# Patient Record
Sex: Female | Born: 1945
Health system: Southern US, Community
[De-identification: ages and names within clinical notes are randomized; demographics above are authoritative.]

## PROBLEM LIST (undated history)

## (undated) DIAGNOSIS — I509 Heart failure, unspecified: Secondary | ICD-10-CM

## (undated) DIAGNOSIS — I10 Essential (primary) hypertension: Secondary | ICD-10-CM

## (undated) DIAGNOSIS — D638 Anemia in other chronic diseases classified elsewhere: Secondary | ICD-10-CM

## (undated) DIAGNOSIS — K529 Noninfective gastroenteritis and colitis, unspecified: Secondary | ICD-10-CM

## (undated) DIAGNOSIS — E039 Hypothyroidism, unspecified: Secondary | ICD-10-CM

## (undated) DIAGNOSIS — C4490 Unspecified malignant neoplasm of skin, unspecified: Secondary | ICD-10-CM

## (undated) DIAGNOSIS — H409 Unspecified glaucoma: Secondary | ICD-10-CM

## (undated) DIAGNOSIS — E669 Obesity, unspecified: Secondary | ICD-10-CM

## (undated) HISTORY — DX: Obesity, unspecified: E66.9

## (undated) HISTORY — DX: Essential (primary) hypertension: I10

## (undated) HISTORY — DX: Unspecified malignant neoplasm of skin, unspecified: C44.90

## (undated) HISTORY — DX: Unspecified glaucoma: H40.9

## (undated) HISTORY — PX: GASTRIC BYPASS: SHX52

## (undated) HISTORY — PX: TUBAL LIGATION: SHX77

## (undated) HISTORY — DX: Noninfective gastroenteritis and colitis, unspecified: K52.9

## (undated) HISTORY — PX: GASTRIC RESTRICTION SURGERY: SHX653

## (undated) HISTORY — DX: Hypothyroidism, unspecified: E03.9

## (undated) HISTORY — DX: Anemia in other chronic diseases classified elsewhere: D63.8

---

## 2007-06-06 ENCOUNTER — Emergency Department (HOSPITAL_COMMUNITY): Admission: EM | Admit: 2007-06-06 | Discharge: 2007-06-06 | Payer: Self-pay | Admitting: Emergency Medicine

## 2011-06-28 DIAGNOSIS — L821 Other seborrheic keratosis: Secondary | ICD-10-CM | POA: Diagnosis not present

## 2011-06-28 DIAGNOSIS — L570 Actinic keratosis: Secondary | ICD-10-CM | POA: Diagnosis not present

## 2011-06-28 DIAGNOSIS — L819 Disorder of pigmentation, unspecified: Secondary | ICD-10-CM | POA: Diagnosis not present

## 2011-06-28 DIAGNOSIS — C44519 Basal cell carcinoma of skin of other part of trunk: Secondary | ICD-10-CM | POA: Diagnosis not present

## 2011-06-28 DIAGNOSIS — D235 Other benign neoplasm of skin of trunk: Secondary | ICD-10-CM | POA: Diagnosis not present

## 2011-06-28 DIAGNOSIS — D485 Neoplasm of uncertain behavior of skin: Secondary | ICD-10-CM | POA: Diagnosis not present

## 2011-08-22 DIAGNOSIS — L570 Actinic keratosis: Secondary | ICD-10-CM | POA: Diagnosis not present

## 2011-08-22 DIAGNOSIS — C44519 Basal cell carcinoma of skin of other part of trunk: Secondary | ICD-10-CM | POA: Diagnosis not present

## 2011-09-27 DIAGNOSIS — H4011X0 Primary open-angle glaucoma, stage unspecified: Secondary | ICD-10-CM | POA: Diagnosis not present

## 2011-09-27 DIAGNOSIS — H04129 Dry eye syndrome of unspecified lacrimal gland: Secondary | ICD-10-CM | POA: Diagnosis not present

## 2011-10-30 DIAGNOSIS — K909 Intestinal malabsorption, unspecified: Secondary | ICD-10-CM | POA: Diagnosis not present

## 2011-10-30 DIAGNOSIS — I10 Essential (primary) hypertension: Secondary | ICD-10-CM | POA: Diagnosis not present

## 2011-10-30 DIAGNOSIS — Z0000 Encounter for general adult medical examination without abnormal findings: Secondary | ICD-10-CM | POA: Diagnosis not present

## 2011-10-30 DIAGNOSIS — D638 Anemia in other chronic diseases classified elsewhere: Secondary | ICD-10-CM | POA: Diagnosis not present

## 2011-10-30 DIAGNOSIS — E039 Hypothyroidism, unspecified: Secondary | ICD-10-CM | POA: Diagnosis not present

## 2011-10-30 DIAGNOSIS — R5381 Other malaise: Secondary | ICD-10-CM | POA: Diagnosis not present

## 2011-10-30 DIAGNOSIS — E669 Obesity, unspecified: Secondary | ICD-10-CM | POA: Diagnosis not present

## 2011-12-31 DIAGNOSIS — Z1231 Encounter for screening mammogram for malignant neoplasm of breast: Secondary | ICD-10-CM | POA: Diagnosis not present

## 2012-02-04 DIAGNOSIS — H4011X0 Primary open-angle glaucoma, stage unspecified: Secondary | ICD-10-CM | POA: Diagnosis not present

## 2012-02-04 DIAGNOSIS — Z23 Encounter for immunization: Secondary | ICD-10-CM | POA: Diagnosis not present

## 2012-02-04 DIAGNOSIS — H04129 Dry eye syndrome of unspecified lacrimal gland: Secondary | ICD-10-CM | POA: Diagnosis not present

## 2012-03-11 DIAGNOSIS — L821 Other seborrheic keratosis: Secondary | ICD-10-CM | POA: Diagnosis not present

## 2012-03-11 DIAGNOSIS — L219 Seborrheic dermatitis, unspecified: Secondary | ICD-10-CM | POA: Diagnosis not present

## 2012-03-11 DIAGNOSIS — L280 Lichen simplex chronicus: Secondary | ICD-10-CM | POA: Diagnosis not present

## 2012-03-11 DIAGNOSIS — Z85828 Personal history of other malignant neoplasm of skin: Secondary | ICD-10-CM | POA: Diagnosis not present

## 2012-03-11 DIAGNOSIS — L819 Disorder of pigmentation, unspecified: Secondary | ICD-10-CM | POA: Diagnosis not present

## 2012-03-11 DIAGNOSIS — L570 Actinic keratosis: Secondary | ICD-10-CM | POA: Diagnosis not present

## 2012-06-21 DIAGNOSIS — B850 Pediculosis due to Pediculus humanus capitis: Secondary | ICD-10-CM | POA: Diagnosis not present

## 2012-09-25 DIAGNOSIS — H04129 Dry eye syndrome of unspecified lacrimal gland: Secondary | ICD-10-CM | POA: Diagnosis not present

## 2012-09-25 DIAGNOSIS — H4011X0 Primary open-angle glaucoma, stage unspecified: Secondary | ICD-10-CM | POA: Diagnosis not present

## 2012-10-15 DIAGNOSIS — M1710 Unilateral primary osteoarthritis, unspecified knee: Secondary | ICD-10-CM | POA: Diagnosis not present

## 2012-10-15 DIAGNOSIS — M25569 Pain in unspecified knee: Secondary | ICD-10-CM | POA: Diagnosis not present

## 2013-01-01 DIAGNOSIS — M25569 Pain in unspecified knee: Secondary | ICD-10-CM | POA: Diagnosis not present

## 2013-01-06 DIAGNOSIS — M25569 Pain in unspecified knee: Secondary | ICD-10-CM | POA: Diagnosis not present

## 2013-01-12 DIAGNOSIS — M25569 Pain in unspecified knee: Secondary | ICD-10-CM | POA: Diagnosis not present

## 2013-01-14 DIAGNOSIS — Z0000 Encounter for general adult medical examination without abnormal findings: Secondary | ICD-10-CM | POA: Diagnosis not present

## 2013-01-14 DIAGNOSIS — R7301 Impaired fasting glucose: Secondary | ICD-10-CM | POA: Diagnosis not present

## 2013-01-21 DIAGNOSIS — Z1231 Encounter for screening mammogram for malignant neoplasm of breast: Secondary | ICD-10-CM | POA: Diagnosis not present

## 2013-01-21 DIAGNOSIS — Z23 Encounter for immunization: Secondary | ICD-10-CM | POA: Diagnosis not present

## 2013-01-21 DIAGNOSIS — D638 Anemia in other chronic diseases classified elsewhere: Secondary | ICD-10-CM | POA: Diagnosis not present

## 2013-01-21 DIAGNOSIS — Z0000 Encounter for general adult medical examination without abnormal findings: Secondary | ICD-10-CM | POA: Diagnosis not present

## 2013-01-21 DIAGNOSIS — E039 Hypothyroidism, unspecified: Secondary | ICD-10-CM | POA: Diagnosis not present

## 2013-01-26 DIAGNOSIS — H04129 Dry eye syndrome of unspecified lacrimal gland: Secondary | ICD-10-CM | POA: Diagnosis not present

## 2013-01-26 DIAGNOSIS — H4011X0 Primary open-angle glaucoma, stage unspecified: Secondary | ICD-10-CM | POA: Diagnosis not present

## 2013-01-26 DIAGNOSIS — M25569 Pain in unspecified knee: Secondary | ICD-10-CM | POA: Diagnosis not present

## 2013-01-29 DIAGNOSIS — M25569 Pain in unspecified knee: Secondary | ICD-10-CM | POA: Diagnosis not present

## 2013-02-03 DIAGNOSIS — M25569 Pain in unspecified knee: Secondary | ICD-10-CM | POA: Diagnosis not present

## 2013-02-04 DIAGNOSIS — M25569 Pain in unspecified knee: Secondary | ICD-10-CM | POA: Diagnosis not present

## 2013-03-19 DIAGNOSIS — T6391XA Toxic effect of contact with unspecified venomous animal, accidental (unintentional), initial encounter: Secondary | ICD-10-CM | POA: Diagnosis not present

## 2013-03-20 DIAGNOSIS — L0291 Cutaneous abscess, unspecified: Secondary | ICD-10-CM | POA: Diagnosis not present

## 2013-03-20 DIAGNOSIS — T6391XA Toxic effect of contact with unspecified venomous animal, accidental (unintentional), initial encounter: Secondary | ICD-10-CM | POA: Diagnosis not present

## 2013-07-27 DIAGNOSIS — L821 Other seborrheic keratosis: Secondary | ICD-10-CM | POA: Diagnosis not present

## 2013-07-27 DIAGNOSIS — L738 Other specified follicular disorders: Secondary | ICD-10-CM | POA: Diagnosis not present

## 2013-07-27 DIAGNOSIS — D1739 Benign lipomatous neoplasm of skin and subcutaneous tissue of other sites: Secondary | ICD-10-CM | POA: Diagnosis not present

## 2013-07-27 DIAGNOSIS — L919 Hypertrophic disorder of the skin, unspecified: Secondary | ICD-10-CM | POA: Diagnosis not present

## 2013-07-27 DIAGNOSIS — D235 Other benign neoplasm of skin of trunk: Secondary | ICD-10-CM | POA: Diagnosis not present

## 2013-07-27 DIAGNOSIS — L819 Disorder of pigmentation, unspecified: Secondary | ICD-10-CM | POA: Diagnosis not present

## 2013-07-27 DIAGNOSIS — L909 Atrophic disorder of skin, unspecified: Secondary | ICD-10-CM | POA: Diagnosis not present

## 2013-07-27 DIAGNOSIS — L570 Actinic keratosis: Secondary | ICD-10-CM | POA: Diagnosis not present

## 2013-07-27 DIAGNOSIS — Z85828 Personal history of other malignant neoplasm of skin: Secondary | ICD-10-CM | POA: Diagnosis not present

## 2013-08-17 DIAGNOSIS — I10 Essential (primary) hypertension: Secondary | ICD-10-CM | POA: Diagnosis not present

## 2013-08-17 DIAGNOSIS — E039 Hypothyroidism, unspecified: Secondary | ICD-10-CM | POA: Diagnosis not present

## 2013-08-25 DIAGNOSIS — W57XXXA Bitten or stung by nonvenomous insect and other nonvenomous arthropods, initial encounter: Secondary | ICD-10-CM | POA: Diagnosis not present

## 2013-08-25 DIAGNOSIS — IMO0001 Reserved for inherently not codable concepts without codable children: Secondary | ICD-10-CM | POA: Diagnosis not present

## 2013-08-25 DIAGNOSIS — R5381 Other malaise: Secondary | ICD-10-CM | POA: Diagnosis not present

## 2013-08-25 DIAGNOSIS — S30860A Insect bite (nonvenomous) of lower back and pelvis, initial encounter: Secondary | ICD-10-CM | POA: Diagnosis not present

## 2013-08-25 DIAGNOSIS — R5383 Other fatigue: Secondary | ICD-10-CM | POA: Diagnosis not present

## 2013-10-26 DIAGNOSIS — IMO0002 Reserved for concepts with insufficient information to code with codable children: Secondary | ICD-10-CM | POA: Diagnosis not present

## 2013-10-26 DIAGNOSIS — R5383 Other fatigue: Secondary | ICD-10-CM | POA: Diagnosis not present

## 2013-10-26 DIAGNOSIS — R5381 Other malaise: Secondary | ICD-10-CM | POA: Diagnosis not present

## 2013-10-26 DIAGNOSIS — S30860A Insect bite (nonvenomous) of lower back and pelvis, initial encounter: Secondary | ICD-10-CM | POA: Diagnosis not present

## 2013-10-26 DIAGNOSIS — W57XXXA Bitten or stung by nonvenomous insect and other nonvenomous arthropods, initial encounter: Secondary | ICD-10-CM | POA: Diagnosis not present

## 2013-11-09 DIAGNOSIS — D649 Anemia, unspecified: Secondary | ICD-10-CM | POA: Diagnosis not present

## 2013-11-09 DIAGNOSIS — H409 Unspecified glaucoma: Secondary | ICD-10-CM | POA: Diagnosis not present

## 2013-11-09 DIAGNOSIS — Z1331 Encounter for screening for depression: Secondary | ICD-10-CM | POA: Diagnosis not present

## 2013-11-09 DIAGNOSIS — E669 Obesity, unspecified: Secondary | ICD-10-CM | POA: Diagnosis not present

## 2013-11-09 DIAGNOSIS — N183 Chronic kidney disease, stage 3 unspecified: Secondary | ICD-10-CM | POA: Diagnosis not present

## 2013-11-09 DIAGNOSIS — I10 Essential (primary) hypertension: Secondary | ICD-10-CM | POA: Diagnosis not present

## 2013-11-09 DIAGNOSIS — Z85828 Personal history of other malignant neoplasm of skin: Secondary | ICD-10-CM | POA: Diagnosis not present

## 2013-11-09 DIAGNOSIS — E039 Hypothyroidism, unspecified: Secondary | ICD-10-CM | POA: Diagnosis not present

## 2013-11-25 DIAGNOSIS — H2000 Unspecified acute and subacute iridocyclitis: Secondary | ICD-10-CM | POA: Diagnosis not present

## 2014-01-19 DIAGNOSIS — I10 Essential (primary) hypertension: Secondary | ICD-10-CM | POA: Diagnosis not present

## 2014-01-19 DIAGNOSIS — E039 Hypothyroidism, unspecified: Secondary | ICD-10-CM | POA: Diagnosis not present

## 2014-01-25 ENCOUNTER — Other Ambulatory Visit: Payer: Self-pay | Admitting: Internal Medicine

## 2014-01-25 DIAGNOSIS — N183 Chronic kidney disease, stage 3 (moderate): Secondary | ICD-10-CM | POA: Diagnosis not present

## 2014-01-25 DIAGNOSIS — E039 Hypothyroidism, unspecified: Secondary | ICD-10-CM | POA: Diagnosis not present

## 2014-01-25 DIAGNOSIS — H409 Unspecified glaucoma: Secondary | ICD-10-CM | POA: Diagnosis not present

## 2014-01-25 DIAGNOSIS — Z1231 Encounter for screening mammogram for malignant neoplasm of breast: Secondary | ICD-10-CM

## 2014-01-25 DIAGNOSIS — Z23 Encounter for immunization: Secondary | ICD-10-CM | POA: Diagnosis not present

## 2014-01-25 DIAGNOSIS — Z0000 Encounter for general adult medical examination without abnormal findings: Secondary | ICD-10-CM | POA: Diagnosis not present

## 2014-01-25 DIAGNOSIS — I10 Essential (primary) hypertension: Secondary | ICD-10-CM | POA: Diagnosis not present

## 2014-01-25 DIAGNOSIS — D649 Anemia, unspecified: Secondary | ICD-10-CM | POA: Diagnosis not present

## 2014-01-25 DIAGNOSIS — E669 Obesity, unspecified: Secondary | ICD-10-CM | POA: Diagnosis not present

## 2014-01-26 DIAGNOSIS — Z1212 Encounter for screening for malignant neoplasm of rectum: Secondary | ICD-10-CM | POA: Diagnosis not present

## 2014-02-08 ENCOUNTER — Ambulatory Visit: Payer: Self-pay

## 2014-02-26 ENCOUNTER — Ambulatory Visit
Admission: RE | Admit: 2014-02-26 | Discharge: 2014-02-26 | Disposition: A | Discharge status: Home / Self Care | Payer: Medicare Other | Source: Ambulatory Visit | Attending: Internal Medicine | Admitting: Internal Medicine

## 2014-02-26 ENCOUNTER — Other Ambulatory Visit: Payer: Self-pay | Admitting: Internal Medicine

## 2014-02-26 DIAGNOSIS — Z1231 Encounter for screening mammogram for malignant neoplasm of breast: Secondary | ICD-10-CM

## 2014-03-01 ENCOUNTER — Ambulatory Visit: Payer: Self-pay

## 2014-03-03 DIAGNOSIS — N183 Chronic kidney disease, stage 3 (moderate): Secondary | ICD-10-CM | POA: Diagnosis not present

## 2014-03-03 DIAGNOSIS — Z6841 Body Mass Index (BMI) 40.0 and over, adult: Secondary | ICD-10-CM | POA: Diagnosis not present

## 2014-03-03 DIAGNOSIS — I10 Essential (primary) hypertension: Secondary | ICD-10-CM | POA: Diagnosis not present

## 2014-03-03 DIAGNOSIS — Z780 Asymptomatic menopausal state: Secondary | ICD-10-CM | POA: Diagnosis not present

## 2014-08-02 DIAGNOSIS — L821 Other seborrheic keratosis: Secondary | ICD-10-CM | POA: Diagnosis not present

## 2014-08-02 DIAGNOSIS — D225 Melanocytic nevi of trunk: Secondary | ICD-10-CM | POA: Diagnosis not present

## 2014-08-02 DIAGNOSIS — L281 Prurigo nodularis: Secondary | ICD-10-CM | POA: Diagnosis not present

## 2014-08-02 DIAGNOSIS — L570 Actinic keratosis: Secondary | ICD-10-CM | POA: Diagnosis not present

## 2014-08-11 DIAGNOSIS — M79672 Pain in left foot: Secondary | ICD-10-CM | POA: Diagnosis not present

## 2014-08-11 DIAGNOSIS — M7742 Metatarsalgia, left foot: Secondary | ICD-10-CM | POA: Diagnosis not present

## 2014-08-17 DIAGNOSIS — K529 Noninfective gastroenteritis and colitis, unspecified: Secondary | ICD-10-CM | POA: Diagnosis not present

## 2014-08-18 DIAGNOSIS — K529 Noninfective gastroenteritis and colitis, unspecified: Secondary | ICD-10-CM | POA: Diagnosis not present

## 2014-09-02 DIAGNOSIS — K529 Noninfective gastroenteritis and colitis, unspecified: Secondary | ICD-10-CM | POA: Diagnosis not present

## 2014-09-02 DIAGNOSIS — I10 Essential (primary) hypertension: Secondary | ICD-10-CM | POA: Diagnosis not present

## 2014-09-02 DIAGNOSIS — M859 Disorder of bone density and structure, unspecified: Secondary | ICD-10-CM | POA: Diagnosis not present

## 2014-09-02 DIAGNOSIS — Z6841 Body Mass Index (BMI) 40.0 and over, adult: Secondary | ICD-10-CM | POA: Diagnosis not present

## 2014-09-02 DIAGNOSIS — E039 Hypothyroidism, unspecified: Secondary | ICD-10-CM | POA: Diagnosis not present

## 2014-09-13 DIAGNOSIS — K529 Noninfective gastroenteritis and colitis, unspecified: Secondary | ICD-10-CM | POA: Diagnosis not present

## 2014-09-13 DIAGNOSIS — I10 Essential (primary) hypertension: Secondary | ICD-10-CM | POA: Diagnosis not present

## 2014-09-13 DIAGNOSIS — E039 Hypothyroidism, unspecified: Secondary | ICD-10-CM | POA: Diagnosis not present

## 2014-09-13 DIAGNOSIS — Z6841 Body Mass Index (BMI) 40.0 and over, adult: Secondary | ICD-10-CM | POA: Diagnosis not present

## 2014-09-13 DIAGNOSIS — R195 Other fecal abnormalities: Secondary | ICD-10-CM | POA: Diagnosis not present

## 2014-10-06 DIAGNOSIS — R197 Diarrhea, unspecified: Secondary | ICD-10-CM | POA: Diagnosis not present

## 2014-10-06 DIAGNOSIS — R195 Other fecal abnormalities: Secondary | ICD-10-CM | POA: Diagnosis not present

## 2014-11-05 DIAGNOSIS — K644 Residual hemorrhoidal skin tags: Secondary | ICD-10-CM | POA: Diagnosis not present

## 2014-11-05 DIAGNOSIS — R195 Other fecal abnormalities: Secondary | ICD-10-CM | POA: Diagnosis not present

## 2014-11-05 DIAGNOSIS — R197 Diarrhea, unspecified: Secondary | ICD-10-CM | POA: Diagnosis not present

## 2014-11-10 DIAGNOSIS — H25093 Other age-related incipient cataract, bilateral: Secondary | ICD-10-CM | POA: Diagnosis not present

## 2014-11-10 DIAGNOSIS — H02833 Dermatochalasis of right eye, unspecified eyelid: Secondary | ICD-10-CM | POA: Diagnosis not present

## 2014-11-10 DIAGNOSIS — H4011X1 Primary open-angle glaucoma, mild stage: Secondary | ICD-10-CM | POA: Diagnosis not present

## 2014-11-10 DIAGNOSIS — H04123 Dry eye syndrome of bilateral lacrimal glands: Secondary | ICD-10-CM | POA: Diagnosis not present

## 2014-12-08 DIAGNOSIS — R5383 Other fatigue: Secondary | ICD-10-CM | POA: Diagnosis not present

## 2014-12-08 DIAGNOSIS — E039 Hypothyroidism, unspecified: Secondary | ICD-10-CM | POA: Diagnosis not present

## 2014-12-08 DIAGNOSIS — Z6841 Body Mass Index (BMI) 40.0 and over, adult: Secondary | ICD-10-CM | POA: Diagnosis not present

## 2014-12-08 DIAGNOSIS — M25562 Pain in left knee: Secondary | ICD-10-CM | POA: Diagnosis not present

## 2015-01-18 ENCOUNTER — Institutional Professional Consult (permissible substitution): Payer: Medicare Other | Admitting: Neurology

## 2015-01-24 DIAGNOSIS — I10 Essential (primary) hypertension: Secondary | ICD-10-CM | POA: Diagnosis not present

## 2015-01-24 DIAGNOSIS — E039 Hypothyroidism, unspecified: Secondary | ICD-10-CM | POA: Diagnosis not present

## 2015-01-31 ENCOUNTER — Ambulatory Visit (INDEPENDENT_AMBULATORY_CARE_PROVIDER_SITE_OTHER): Payer: Medicare Other | Admitting: Neurology

## 2015-01-31 ENCOUNTER — Encounter: Payer: Self-pay | Admitting: Neurology

## 2015-01-31 VITALS — BP 142/82 | HR 86 | Resp 20 | Ht 65.0 in | Wt 259.0 lb

## 2015-01-31 DIAGNOSIS — Z6841 Body Mass Index (BMI) 40.0 and over, adult: Secondary | ICD-10-CM | POA: Diagnosis not present

## 2015-01-31 DIAGNOSIS — G4730 Sleep apnea, unspecified: Secondary | ICD-10-CM

## 2015-01-31 DIAGNOSIS — J302 Other seasonal allergic rhinitis: Secondary | ICD-10-CM | POA: Diagnosis not present

## 2015-01-31 DIAGNOSIS — M170 Bilateral primary osteoarthritis of knee: Secondary | ICD-10-CM | POA: Diagnosis not present

## 2015-01-31 DIAGNOSIS — R270 Ataxia, unspecified: Secondary | ICD-10-CM | POA: Diagnosis not present

## 2015-01-31 DIAGNOSIS — R0902 Hypoxemia: Secondary | ICD-10-CM | POA: Diagnosis not present

## 2015-01-31 DIAGNOSIS — D649 Anemia, unspecified: Secondary | ICD-10-CM | POA: Diagnosis not present

## 2015-01-31 DIAGNOSIS — R0683 Snoring: Secondary | ICD-10-CM | POA: Diagnosis not present

## 2015-01-31 DIAGNOSIS — E669 Obesity, unspecified: Secondary | ICD-10-CM | POA: Diagnosis not present

## 2015-01-31 DIAGNOSIS — Z23 Encounter for immunization: Secondary | ICD-10-CM | POA: Diagnosis not present

## 2015-01-31 DIAGNOSIS — N183 Chronic kidney disease, stage 3 (moderate): Secondary | ICD-10-CM | POA: Diagnosis not present

## 2015-01-31 DIAGNOSIS — I129 Hypertensive chronic kidney disease with stage 1 through stage 4 chronic kidney disease, or unspecified chronic kidney disease: Secondary | ICD-10-CM | POA: Diagnosis not present

## 2015-01-31 DIAGNOSIS — Z1389 Encounter for screening for other disorder: Secondary | ICD-10-CM | POA: Diagnosis not present

## 2015-01-31 DIAGNOSIS — M25562 Pain in left knee: Secondary | ICD-10-CM | POA: Diagnosis not present

## 2015-01-31 DIAGNOSIS — Z0000 Encounter for general adult medical examination without abnormal findings: Secondary | ICD-10-CM | POA: Diagnosis not present

## 2015-01-31 DIAGNOSIS — I10 Essential (primary) hypertension: Secondary | ICD-10-CM | POA: Diagnosis not present

## 2015-01-31 NOTE — Patient Instructions (Signed)

## 2015-01-31 NOTE — Progress Notes (Signed)
SLEEP MEDICINE CLINIC   Provider:  Larey Seat, M D  Referring Provider: Velna Hatchet, MD Primary Care Physician:  Velna Hatchet, MD  Chief Complaint  Patient presents with  . New Patient (Initial Visit)    daytime fatigue, rm 10, alone    Chief complaint according to patient : " I sleep well but I feel fatigued"  HPI:  Lynn Joseph is a 69 y.o. female , seen here as a referral  from Dr. Ardeth Perfect for an evaluation of sleep disturbances.  Lynn Joseph is a Caucasian, right-handed female patient of Dr. Ardeth Perfect , moved to [place] about a year ago to follow her daughter. She reports that her sleep has been nonrestorative and that she was observed and witnessed to snore. She also correlates this with weight gain over the last years. In daytime however she feels fatigued. She is diagnosed with thyroid dysfunction.   She has a history of significant obesity and underwent weight loss surgery with a gastric stapling in 1987. 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 she was in great danger for her life as her potassium levels were depleted and also certain vitamins and trace elements. After the surgery during the wake-up process she was noticed to have hypoxemia and prolonged hypoxia her oxygen levels were dropping. She was very sick , admitted to [facility] in the intensive care unit for quite a while after the gastric obstruction and a sleep evaluation was not yet entertained . She has regained weight since 2009 when her weight was about 180 pounds, now 255 pounds. Now , she is a loud snorer and mouth breather , but does not know if she has apnea. A ONO pulseox was not yet obtained.  Sleep habits are as follows: The patient usually retires to bed between 7 and 12. He has no TV in the  bedroom the bedroom is described as cool, quiet and dark. She sleeps alone a dog and a cat are in the same room. She often doesn't have to rise at all during the night sometimes she has one bathroom break, but this is exceptional. She usually sleeps through until 7:30 AM. She wakes spontaneously. When she wakes up she feels not so much refreshed or restored but she does have a dry mouth. On occasion she will have a morning headache a dull headaches that leave her as the day goes on. She would normally not take any medication for this. The patient prefers to sleep prone or in the lateral recumbent position. When she wakes up however she finds herself in supine. Sometimes after waking up she takes consciously a couple of deep breaths as if she didn't get enough air.  She usually drinks coffee in the morning and will take her dog for walks. She exposes herself to natural daylight. He does like to watch TV in daytime, she drinks caffeinated sodas another couple of coffee usually in the day as well. But not with dinner. When she had a busy day she may nap in the afternoon on her sofa or falling asleep watching TV but this is exceptional.  On week ends she keeps a similar routine.   Sleep medical history and family sleep history: her mother was obese and snored loudly, had HTN, CVA and died at age 35. Her father had  kidney failure at age 24.   Social history: The patient is a lifelong nonsmoker and rarely drinks alcohol, she is widowed, retired and relocated to the area to be near family in early 2015 moving from [place]. The patient used to be self-employed as a Sport and exercise psychologist.  Review of Systems: Out of a complete 14 system review, the patient complains of only the following symptoms, and all other reviewed systems are negative.   Epworth score 4 , Fatigue severity score 48  , geriatric depression score 3 points    Social History   Social History  . Marital Status:  Married    Spouse Name: N/A  . Number of Children: N/A  . Years of Education: N/A   Occupational History  . Not on file.   Social History Main Topics  . Smoking status: Never Smoker   . Smokeless tobacco: Not on file  . Alcohol Use: 0.0 oz/week    0 Standard drinks or equivalent per week     Comment: 1 glass of wine weekly  . Drug Use: No  . Sexual Activity: Not on file   Other Topics Concern  . Not on file   Social History Narrative   Drinks 1-2 cups of caffeine daily.    Family History  Problem Relation Age of Onset  . Hypertension Mother   . Thyroid disease Mother   . Stroke Mother   . Kidney disease Father   . Diabetes Father     Past Medical History  Diagnosis Date  . Obesity   . Anemia of chronic disease   . HTN (hypertension)   . Glaucoma   . Skin cancer   . Chronic diarrhea   . Hypothyroid     Past Surgical History  Procedure Laterality Date  . Gastric bypass    . Tubal ligation      Current Outpatient Prescriptions  Medication Sig Dispense Refill  . latanoprost (XALATAN) 0.005 % ophthalmic solution 1 drop at bedtime.    . Levothyroxine Sodium (TIROSINT) 125 MCG CAPS Take 125 mcg by mouth daily before breakfast.    . metoprolol (LOPRESSOR) 100 MG tablet Take 100 mg by mouth 2 (two) times daily.    . Multiple Vitamin (MULTIVITAMIN) tablet Take 1 tablet by mouth daily.    Marland Kitchen triamterene-hydrochlorothiazide (MAXZIDE-25) 37.5-25 MG tablet Take 1 tablet by mouth daily.    . valsartan (DIOVAN) 160 MG tablet Take 160 mg by mouth daily.     No current facility-administered medications for this visit.    Allergies as of 01/31/2015  . (Not on File)    Vitals: BP 142/82 mmHg  Pulse 86  Resp 20  Ht 5\' 5"  (1.651 m)  Wt 259 lb (117.482 kg)  BMI 43.10 kg/m2 Last Weight:  Wt Readings from Last 1 Encounters:  01/31/15 259 lb (117.482 kg)   KKX:FGHW mass index is 43.1 kg/(m^2).     Last Height:   Ht Readings from Last 1 Encounters:  01/31/15 5\' 5"   (1.651 m)    Physical exam:  General: The patient is awake, alert and appears not in acute distress. The patient is well groomed. Head: Normocephalic, atraumatic. Neck is supple. Mallampati 2 - reddend upper airway   neck circumference:15.5.  Nasal airflow unrestricted, TMJ is not evident .  Retrognathia is seen.  Cardiovascular:  Regular rate and rhythm , without  murmurs or carotid bruit, and without distended neck veins. Respiratory: Lungs are clear to auscultation. Skin:  Without evidence  of edema, or rash Trunk: BMI is elevated . The patient's posture is erect   Neurologic exam : The patient is awake and alert, oriented to place and time.   Memory subjective  described as intact. Attention span & concentration ability appears normal.  Speech is fluent,  without dysarthria, dysphonia or aphasia.  Mood and affect are appropriate.  Cranial nerves: Pupils are equal and briskly reactive to light. Funduscopic exam without  evidence of pallor or edema.  Extraocular movements  in vertical and horizontal planes intact and without nystagmus. Visual fields by finger perimetry are intact. Hearing to finger rub intact.   Facial sensation intact to fine touch.  Facial motor strength is symmetric and tongue and uvula move midline. Shoulder shrug was symmetrical.   Motor exam:   Normal tone, muscle bulk and symmetric strength in all extremities.  Sensory:  Fine touch, pinprick and vibration were tested in all extremities. Proprioception tested in the upper extremities was normal.  Coordination: Rapid alternating movements in the fingers/hands was normal. Finger-to-nose maneuver normal without evidence of ataxia, dysmetria or tremor.  Gait and station: Patient walks without assistive device and is able unassisted to climb up to the exam table. Strength within normal limits.  Stance is stable, yet  widebased.  Toe and heel stand were tested .Tandem gait is unfragmented. Turns with 4 Steps.  Romberg testing revealed swaying backwards, but too mild to fall.   Deep tendon reflexes: in the  upper and lower extremities are symmetric and intact. Babinski maneuver response is downgoing.  The patient was advised of the nature of the diagnosed sleep disorder , the treatment options and risks for general a health and wellness arising from not treating the condition.  I spent more than 45 minutes of face to face time with the patient. Greater than 50% of time was spent in counseling and coordination of care. We have discussed the diagnosis and differential and I answered the patient's questions.     Assessment:  After physical and neurologic examination, review of laboratory studies,  Personal review of imaging studies, reports of other /same  Imaging studies ,  Results of polysomnography/ neurophysiology testing and pre-existing records as far as provided in visit., my assessment is:    1) OSA: her daughter has witnessed sleep apnea and snoring, but not recently. The patient and doors to high degree of fatigue at 48 points but not of sleepiness. The Epworth score was only endorsed at 4 points and she is not clinically depressed. Her medical condition such as thyroid disease could play a role in it as well as possible malabsorption of other trace elements or vitamins. I would like for her to undergo a sleep test and if her insurance carrier allows and attended sleep study. My goal is to see if she has chronic hypoxemia or hypercapnia explaining the fatigue degree even if apnea not be present or only mildly seen. She reports dreaming , and a dry mouth , morning headaches.  2) balance : I would like for Lynn Joseph to assure that she takes a vitamin B complex beyond just vitamin B12 supplement. I also encouraged her to take trace elements especially copper. She has some bite balance problems and the sore and her Romberg that she sways so falling and pre-prevention of falling would be our main goal  here. The posterior column of the spinal cord mediates balance and needs the copper element to perform. She has quit taking B 12, needs to restart.  3) obesity: routine walk of 30 minutes daily or 20 minutes twice daily, or biking. Swimming encouraged as she has joint pain. She snow birds in [facility] and has access to a pool there.    Plan:  Treatment plan and additional workup : SPLIT study with hypercapnia. Should Lynn Joseph sleep study revealed only mild apnea without hypoxemia she could benefit from a mandibular advancement device. Retrognathia would be corrected the device can be titrated in a sleep lab and is usually slowly adjusted to progress the mandibula as not to cause TMJ problems. The patient advised me that she has a history of TMJ pain.     Lynn Partridge Tayo Maute MD  01/31/2015   CC: Velna Hatchet, [place], [place] [address]

## 2015-02-02 ENCOUNTER — Ambulatory Visit (INDEPENDENT_AMBULATORY_CARE_PROVIDER_SITE_OTHER): Payer: Medicare Other | Admitting: Neurology

## 2015-02-02 DIAGNOSIS — G4730 Sleep apnea, unspecified: Secondary | ICD-10-CM | POA: Diagnosis not present

## 2015-02-02 DIAGNOSIS — R0902 Hypoxemia: Secondary | ICD-10-CM

## 2015-02-02 DIAGNOSIS — E669 Obesity, unspecified: Secondary | ICD-10-CM

## 2015-02-02 DIAGNOSIS — R0683 Snoring: Secondary | ICD-10-CM

## 2015-02-03 NOTE — Sleep Study (Signed)
Please see the scanned sleep study interpretation located in the procedure tab within the chart review section.   

## 2015-02-07 ENCOUNTER — Telehealth: Payer: Self-pay

## 2015-02-07 NOTE — Telephone Encounter (Signed)
Spoke to pt regarding her sleep study results. 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 Advised her that pap therapy is optional and that alternate therapies include and oral appliance or an ENT evaluation. Pt was also advised that PLMS were seen in her sleep and that we may consider treating the PLMS if there is a history of RLS. I also advised weight loss, diet, and exerise.  Pt wishes to make an appt with Dr. Brett Fairy to discuss these results before she decided on a treatment option. Appt made with pt for 11/9 at 10:00.

## 2015-03-02 ENCOUNTER — Ambulatory Visit: Payer: Self-pay | Admitting: Neurology

## 2015-03-07 ENCOUNTER — Ambulatory Visit (INDEPENDENT_AMBULATORY_CARE_PROVIDER_SITE_OTHER): Payer: Medicare Other | Admitting: Neurology

## 2015-03-07 ENCOUNTER — Encounter (INDEPENDENT_AMBULATORY_CARE_PROVIDER_SITE_OTHER): Payer: Self-pay

## 2015-03-07 ENCOUNTER — Encounter: Payer: Self-pay | Admitting: Neurology

## 2015-03-07 VITALS — BP 132/90 | HR 70 | Resp 20 | Ht 65.0 in | Wt 265.0 lb

## 2015-03-07 DIAGNOSIS — G4733 Obstructive sleep apnea (adult) (pediatric): Secondary | ICD-10-CM

## 2015-03-07 DIAGNOSIS — R0683 Snoring: Secondary | ICD-10-CM

## 2015-03-07 DIAGNOSIS — G478 Other sleep disorders: Secondary | ICD-10-CM

## 2015-03-07 NOTE — Progress Notes (Signed)
SLEEP MEDICINE CLINIC   Provider:  Larey Seat, M D  Referring Provider: Velna Hatchet, MD Primary Care Physician:  Velna Hatchet, MD  Chief Complaint  Patient presents with  . Follow-up    sleep study results, discuss treatment, rm 11, alone    Chief complaint according to patient : " I sleep well but I feel fatigued"  HPI:  Lynn Joseph is a 69 y.o. female , seen here as a referral  from Dr. Ardeth Perfect for an evaluation of sleep disturbances.  Lynn Joseph is a Caucasian,widowed,  right-handed female patient of Dr. Ardeth Perfect , moved to [place] about a year ago to follow her daughter. She reports that her sleep has been nonrestorative and that she was observed and witnessed to snore. She also correlates this with weight gain over the last years. In daytime however she feels fatigued. She is diagnosed with thyroid dysfunction.  She has a history of significant obesity and underwent weight loss surgery with a gastric stapling in 1987. 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 she was in great danger for her life as her potassium levels were depleted and also certain vitamins and trace elements. After the surgery during the wake-up process she was noticed to have hypoxemia and prolonged hypoxia her oxygen levels were dropping. She was very sick , admitted to [facility] in the intensive care unit for quite a while after the gastric obstruction and a sleep evaluation was not yet entertained. She has regained weight since 2009 , when her weight was its' lowest at about 180 pounds, now 255 pounds.  She is a loud snorer and mouth breather , but does not know if she has apnea. An overnight pulseoximetry was not yet obtained.  Sleep habits are as follows: The patient usually retires to bed between  76 and 12. He has no TV in the bedroom the bedroom is described as cool, quiet and dark. She sleeps alone a dog and a cat are in the same room. She often doesn't have to rise at all during the night sometimes she has one bathroom break, but this is exceptional. She usually sleeps through until 7:30 AM. She wakes spontaneously. When she wakes up she feels not so much refreshed or restored but she does have a dry mouth. On occasion she will have a morning headache a dull headaches that leave her as the day goes on. She would normally not take any medication for this. The patient prefers to sleep prone or in the lateral recumbent position. When she wakes up however she finds herself in supine. Sometimes after waking up she takes consciously a couple of deep breaths as if she didn't get enough air. She usually drinks coffee in the morning and will take her dog for walks. She exposes herself to natural daylight. He does like to watch TV in daytime, she drinks caffeinated sodas another couple of coffee usually in the day as well,  not with dinner. After a busy day she may nap in the afternoon on her sofa or falling asleep watching TV- but this is exceptional. On week ends she keeps a similar routine.   Sleep medical history and family sleep history: her mother was obese and snored loudly, had HTN, CVA and died at age 89. Her father had kidney failure  at age 65.   Social history: The patient is a lifelong nonsmoker and rarely drinks alcohol, she is widowed, retired and relocated to the area to be near family in early 2015 moving from [place]. The patient used to be self-employed as a Sport and exercise psychologist.  Interval history; Lynn Joseph underwent a polysomnography on 02-02-15. He had a slight elevated blood pressure when she entered the study but 142/82 mmHg left arm. She was able to fall asleep after 11.5 minutes but she woke up frequently for the rest of the night it took her a long  time to enter REM sleep of 369 minutes to be exact. Overall she slept only 73% of the time recorded. There were only 10 respiratory events and no evidence of significant sleep apnea. Her oxygen desaturation at the lowest was 80% but the total time in desaturation was only 4.1 minutes. She did have frequent kicking and periodic limb movements with an arousal index of 3.4. Loud snoring was recorded and some of her arousal seem to be related to that. She seemed twice to choke in her sleep although she slept on her right side and not supine.  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 Since this patient did not loose oxygen saturation or just for a trivial amount of time I would recommend an oral appliance or ENT evaluation. In addition it is always beneficial to lose weight. The patient has retrognathia, and is willing to see a dentist for a mandibular advancement.   Review of Systems: Out of a complete 14 system review, the patient complains of only the following symptoms, and all other reviewed systems are negative.   Epworth score 4 , Fatigue severity score 48  , geriatric depression score 3 points    Social History   Social History  . Marital Status: Married    Spouse Name: N/A  . Number of Children: N/A  . Years of Education: N/A   Occupational History  . Not on file.   Social History Main Topics  . Smoking status: Never Smoker   . Smokeless tobacco: Not on file  . Alcohol Use: 0.0 oz/week    0 Standard drinks or equivalent per week     Comment: 1 glass of wine weekly  . Drug Use: No  . Sexual Activity: Not on file   Other Topics Concern  . Not on file   Social History Narrative   Drinks 1-2 cups of caffeine daily.    Family History  Problem Relation Age of Onset  . Hypertension Mother   . Thyroid disease Mother   . Stroke Mother   . Kidney  disease Father   . Diabetes Father     Past Medical History  Diagnosis Date  . Obesity   . Anemia of chronic disease   . HTN (hypertension)   . Glaucoma   . Skin cancer   . Chronic diarrhea   . Hypothyroid     Past Surgical History  Procedure Laterality Date  . Gastric bypass    . Tubal ligation      Current Outpatient Prescriptions  Medication Sig Dispense Refill  . latanoprost (XALATAN) 0.005 % ophthalmic solution 1 drop at bedtime.    . Levothyroxine Sodium (TIROSINT) 125 MCG CAPS Take 125 mcg by mouth daily before breakfast.    . metoprolol (LOPRESSOR) 100 MG tablet Take 100 mg  by mouth 2 (two) times daily.    . Multiple Vitamin (MULTIVITAMIN) tablet Take 1 tablet by mouth daily.    Marland Kitchen triamterene-hydrochlorothiazide (MAXZIDE-25) 37.5-25 MG tablet Take 1 tablet by mouth daily.    . valsartan (DIOVAN) 160 MG tablet Take 160 mg by mouth daily.     No current facility-administered medications for this visit.    Allergies as of 03/07/2015  . (No Known Allergies)    Vitals: BP 132/90 mmHg  Pulse 70  Resp 20  Ht 5\' 5"  (1.651 m)  Wt 265 lb (120.203 kg)  BMI 44.10 kg/m2 Last Weight:  Wt Readings from Last 1 Encounters:  03/07/15 265 lb (120.203 kg)   [id number] mass index is 44.1 kg/(m^2).     Last Height:   Ht Readings from Last 1 Encounters:  03/07/15 5\' 5"  (1.651 m)    Physical exam:  General: The patient is awake, alert and appears not in acute distress. The patient is well groomed. Head: Normocephalic, atraumatic. Neck is supple. Mallampati 2 - reddend upper airway   neck circumference:15.5.  Nasal airflow unrestricted, TMJ is not evident .  Retrognathia is seen.  Cardiovascular:  Regular rate and rhythm , without  murmurs or carotid bruit, and without distended neck veins. Respiratory: Lungs are clear to auscultation. Skin:  Without evidence of edema, or rash Trunk: BMI is elevated . The patient's posture is erect   Neurologic exam : The patient is awake  and alert, oriented to place and time.   Memory subjective  described as intact. Attention span & concentration ability appears normal.  Speech is fluent,  without dysarthria, dysphonia or aphasia.  Mood and affect are appropriate.  Cranial nerves: Pupils are equal and briskly reactive to light. Extraocular movements  in vertical and horizontal planes intact and without nystagmus. Visual fields by finger perimetry are intact. Hearing to finger rub intact. Facial sensation intact to fine touch.Facial motor strength is symmetric and tongue and uvula move midline. Shoulder shrug was symmetrical.   Motor exam:   Normal tone, muscle bulk and symmetric strength in all extremities. Deep tendon reflexes: in the  upper and lower extremities are symmetric and intact. Babinski maneuver response is downgoing.  The patient was advised of the nature of the diagnosed sleep disorder , the treatment options and risks for general a health and wellness arising from not treating the condition.  I spent more than 15 minutes of face to face time with the patient. Greater than 50% of time was spent in counseling and coordination of care. We have discussed the diagnosis and differential and I answered the patient's questions.     Assessment:  After physical and neurologic examination, review of laboratory studies,  Personal review of imaging studies, reports of other /same  Imaging studies ,  Results of polysomnography/ neurophysiology testing and pre-existing records as far as provided in visit., my assessment is:    1) Lynn Joseph presented with clinically insignificant apnea, and AHI of only 2.1 was noted.  She also slept most of the night on the right lateral position and in supine sleep for only 3% of the.total recorded time. There was no supine AHI elicited. During REM sleep she did have personally more events than in non-REM sleep. She did not have hypoxemia.   20 UARS - snoring .My recommendation is to  treat snoring by a dental guard a mandibular advancement device. The patient has a daughter that works in the medical field and I will send a  referral to Dr. Jule Ser, DDS .   3) PLMs  -she had frequent periodic limb movements and arousals by periodic limb movements at 3.4 per hour.-As to her periodic limb movements:  these can be associated with restless legs she states that in the past she had sometimes restless legs but not now. It may still be beneficial for her to take a multivitamin with some iron-  iron deficiency is one of the main triggers for periodic limb movements. I usually recommend a prenatal vitamin which includes folic acid and the B Vit. complex.  Asencion Partridge Zia Kanner MD  03/07/2015   CC: Velna Hatchet, Md [address] [place], [place] [address]   Cc Prn .  Dental referral.

## 2015-03-09 ENCOUNTER — Other Ambulatory Visit: Payer: Self-pay

## 2015-03-09 DIAGNOSIS — Z1231 Encounter for screening mammogram for malignant neoplasm of breast: Secondary | ICD-10-CM

## 2015-04-14 ENCOUNTER — Ambulatory Visit
Admission: RE | Admit: 2015-04-14 | Discharge: 2015-04-14 | Disposition: A | Discharge status: Home / Self Care | Payer: Medicare Other | Source: Ambulatory Visit

## 2015-04-14 DIAGNOSIS — Z1231 Encounter for screening mammogram for malignant neoplasm of breast: Secondary | ICD-10-CM

## 2015-04-19 ENCOUNTER — Other Ambulatory Visit: Payer: Self-pay | Admitting: Internal Medicine

## 2015-04-19 DIAGNOSIS — R928 Other abnormal and inconclusive findings on diagnostic imaging of breast: Secondary | ICD-10-CM

## 2015-04-20 ENCOUNTER — Other Ambulatory Visit: Payer: Self-pay | Admitting: Internal Medicine

## 2015-04-20 ENCOUNTER — Ambulatory Visit
Admission: RE | Admit: 2015-04-20 | Discharge: 2015-04-20 | Disposition: A | Discharge status: Home / Self Care | Payer: Medicare Other | Source: Ambulatory Visit | Attending: Internal Medicine | Admitting: Internal Medicine

## 2015-04-20 DIAGNOSIS — R928 Other abnormal and inconclusive findings on diagnostic imaging of breast: Secondary | ICD-10-CM

## 2015-04-20 DIAGNOSIS — R921 Mammographic calcification found on diagnostic imaging of breast: Secondary | ICD-10-CM | POA: Diagnosis not present

## 2015-04-22 ENCOUNTER — Inpatient Hospital Stay: Admission: RE | Admit: 2015-04-22 | Payer: Medicare Other | Source: Ambulatory Visit

## 2015-05-04 DIAGNOSIS — L821 Other seborrheic keratosis: Secondary | ICD-10-CM | POA: Diagnosis not present

## 2015-05-04 DIAGNOSIS — L570 Actinic keratosis: Secondary | ICD-10-CM | POA: Diagnosis not present

## 2015-05-04 DIAGNOSIS — L814 Other melanin hyperpigmentation: Secondary | ICD-10-CM | POA: Diagnosis not present

## 2015-06-08 DIAGNOSIS — R05 Cough: Secondary | ICD-10-CM | POA: Diagnosis not present

## 2015-06-08 DIAGNOSIS — J019 Acute sinusitis, unspecified: Secondary | ICD-10-CM | POA: Diagnosis not present

## 2015-06-08 DIAGNOSIS — Z6841 Body Mass Index (BMI) 40.0 and over, adult: Secondary | ICD-10-CM | POA: Diagnosis not present

## 2015-06-08 DIAGNOSIS — J029 Acute pharyngitis, unspecified: Secondary | ICD-10-CM | POA: Diagnosis not present

## 2015-06-08 DIAGNOSIS — I10 Essential (primary) hypertension: Secondary | ICD-10-CM | POA: Diagnosis not present

## 2015-06-22 DIAGNOSIS — L570 Actinic keratosis: Secondary | ICD-10-CM | POA: Diagnosis not present

## 2015-07-12 DIAGNOSIS — S0031XA Abrasion of nose, initial encounter: Secondary | ICD-10-CM | POA: Diagnosis not present

## 2015-07-12 DIAGNOSIS — R234 Changes in skin texture: Secondary | ICD-10-CM | POA: Diagnosis not present

## 2015-07-12 DIAGNOSIS — D225 Melanocytic nevi of trunk: Secondary | ICD-10-CM | POA: Diagnosis not present

## 2015-07-12 DIAGNOSIS — L821 Other seborrheic keratosis: Secondary | ICD-10-CM | POA: Diagnosis not present

## 2015-07-12 DIAGNOSIS — D485 Neoplasm of uncertain behavior of skin: Secondary | ICD-10-CM | POA: Diagnosis not present

## 2015-07-12 DIAGNOSIS — L281 Prurigo nodularis: Secondary | ICD-10-CM | POA: Diagnosis not present

## 2015-09-06 DIAGNOSIS — H6121 Impacted cerumen, right ear: Secondary | ICD-10-CM | POA: Diagnosis not present

## 2015-09-06 DIAGNOSIS — E038 Other specified hypothyroidism: Secondary | ICD-10-CM | POA: Diagnosis not present

## 2015-09-06 DIAGNOSIS — Z6841 Body Mass Index (BMI) 40.0 and over, adult: Secondary | ICD-10-CM | POA: Diagnosis not present

## 2015-09-06 DIAGNOSIS — N183 Chronic kidney disease, stage 3 (moderate): Secondary | ICD-10-CM | POA: Diagnosis not present

## 2015-09-06 DIAGNOSIS — E668 Other obesity: Secondary | ICD-10-CM | POA: Diagnosis not present

## 2015-09-06 DIAGNOSIS — I10 Essential (primary) hypertension: Secondary | ICD-10-CM | POA: Diagnosis not present

## 2015-09-06 DIAGNOSIS — R0609 Other forms of dyspnea: Secondary | ICD-10-CM | POA: Diagnosis not present

## 2015-09-14 DIAGNOSIS — H401130 Primary open-angle glaucoma, bilateral, stage unspecified: Secondary | ICD-10-CM | POA: Diagnosis not present

## 2015-09-16 ENCOUNTER — Other Ambulatory Visit (HOSPITAL_COMMUNITY): Payer: Self-pay | Admitting: Internal Medicine

## 2015-09-16 DIAGNOSIS — R0609 Other forms of dyspnea: Principal | ICD-10-CM

## 2015-09-27 ENCOUNTER — Ambulatory Visit (HOSPITAL_COMMUNITY): Payer: Medicare Other | Attending: Cardiology

## 2015-09-27 ENCOUNTER — Other Ambulatory Visit: Payer: Self-pay

## 2015-09-27 ENCOUNTER — Other Ambulatory Visit (HOSPITAL_COMMUNITY): Payer: Self-pay

## 2015-09-27 DIAGNOSIS — I119 Hypertensive heart disease without heart failure: Secondary | ICD-10-CM | POA: Diagnosis not present

## 2015-09-27 DIAGNOSIS — I340 Nonrheumatic mitral (valve) insufficiency: Secondary | ICD-10-CM | POA: Diagnosis not present

## 2015-09-27 DIAGNOSIS — R0600 Dyspnea, unspecified: Secondary | ICD-10-CM | POA: Diagnosis present

## 2015-09-27 DIAGNOSIS — Z6841 Body Mass Index (BMI) 40.0 and over, adult: Secondary | ICD-10-CM | POA: Diagnosis not present

## 2015-09-27 DIAGNOSIS — I371 Nonrheumatic pulmonary valve insufficiency: Secondary | ICD-10-CM | POA: Diagnosis not present

## 2015-09-27 DIAGNOSIS — I071 Rheumatic tricuspid insufficiency: Secondary | ICD-10-CM | POA: Diagnosis not present

## 2015-09-27 DIAGNOSIS — R0609 Other forms of dyspnea: Secondary | ICD-10-CM | POA: Diagnosis not present

## 2015-09-27 LAB — ECHOCARDIOGRAM COMPLETE
AOASC: 36 cm
AVLVOTPG: 3 mmHg
E decel time: 254 msec
E/e' ratio: 8.01
FS: 36 % (ref 28–44)
IVS/LV PW RATIO, ED: 0.98
LA diam end sys: 45 mm
LA diam index: 2.02 cm/m2
LA vol index: 32.3 mL/m2
LA vol: 72 mL
LASIZE: 45 mm
LAVOLA4C: 77 mL
LDCA: 3.14 cm2
LV E/e' medial: 8.01
LV E/e'average: 8.01
LV PW d: 13.2 mm — AB (ref 0.6–1.1)
LV TDI E'LATERAL: 11.4
LV TDI E'MEDIAL: 10.8
LV dias vol index: 43 mL/m2
LV sys vol: 33 mL (ref 14–42)
LVDIAVOL: 96 mL (ref 46–106)
LVELAT: 11.4 cm/s
LVOT SV: 63 mL
LVOT VTI: 20 cm
LVOT peak vel: 88 cm/s
LVOTD: 20 mm
LVSYSVOLIN: 15 mL/m2
Lateral S' vel: 14 cm/s
MV Dec: 254
MV Peak grad: 3 mmHg
MV pk E vel: 91.3 m/s
MVPKAVEL: 69.1 m/s
RV sys press: 31 mmHg
Reg peak vel: 266 cm/s
Simpson's disk: 66
Stroke v: 63 ml
TRMAXVEL: 266 cm/s

## 2015-10-04 DIAGNOSIS — H401130 Primary open-angle glaucoma, bilateral, stage unspecified: Secondary | ICD-10-CM | POA: Diagnosis not present

## 2016-01-25 DIAGNOSIS — E038 Other specified hypothyroidism: Secondary | ICD-10-CM | POA: Diagnosis not present

## 2016-01-25 DIAGNOSIS — R8299 Other abnormal findings in urine: Secondary | ICD-10-CM | POA: Diagnosis not present

## 2016-01-25 DIAGNOSIS — I10 Essential (primary) hypertension: Secondary | ICD-10-CM | POA: Diagnosis not present

## 2016-01-25 DIAGNOSIS — N390 Urinary tract infection, site not specified: Secondary | ICD-10-CM | POA: Diagnosis not present

## 2016-02-07 DIAGNOSIS — I10 Essential (primary) hypertension: Secondary | ICD-10-CM | POA: Diagnosis not present

## 2016-02-07 DIAGNOSIS — Z23 Encounter for immunization: Secondary | ICD-10-CM | POA: Diagnosis not present

## 2016-02-07 DIAGNOSIS — I517 Cardiomegaly: Secondary | ICD-10-CM | POA: Diagnosis not present

## 2016-02-07 DIAGNOSIS — E038 Other specified hypothyroidism: Secondary | ICD-10-CM | POA: Diagnosis not present

## 2016-02-07 DIAGNOSIS — Z1389 Encounter for screening for other disorder: Secondary | ICD-10-CM | POA: Diagnosis not present

## 2016-02-07 DIAGNOSIS — E668 Other obesity: Secondary | ICD-10-CM | POA: Diagnosis not present

## 2016-02-07 DIAGNOSIS — N183 Chronic kidney disease, stage 3 (moderate): Secondary | ICD-10-CM | POA: Diagnosis not present

## 2016-02-07 DIAGNOSIS — Z6841 Body Mass Index (BMI) 40.0 and over, adult: Secondary | ICD-10-CM | POA: Diagnosis not present

## 2016-02-07 DIAGNOSIS — D649 Anemia, unspecified: Secondary | ICD-10-CM | POA: Diagnosis not present

## 2016-02-07 DIAGNOSIS — R0609 Other forms of dyspnea: Secondary | ICD-10-CM | POA: Diagnosis not present

## 2016-02-07 DIAGNOSIS — Z0000 Encounter for general adult medical examination without abnormal findings: Secondary | ICD-10-CM | POA: Diagnosis not present

## 2016-02-13 DIAGNOSIS — Z1212 Encounter for screening for malignant neoplasm of rectum: Secondary | ICD-10-CM | POA: Diagnosis not present

## 2016-02-15 ENCOUNTER — Ambulatory Visit (INDEPENDENT_AMBULATORY_CARE_PROVIDER_SITE_OTHER)
Admission: RE | Admit: 2016-02-15 | Discharge: 2016-02-15 | Disposition: A | Discharge status: Home / Self Care | Payer: Medicare Other | Source: Ambulatory Visit | Attending: Pulmonary Disease | Admitting: Pulmonary Disease

## 2016-02-15 ENCOUNTER — Ambulatory Visit (INDEPENDENT_AMBULATORY_CARE_PROVIDER_SITE_OTHER): Payer: Medicare Other | Admitting: Pulmonary Disease

## 2016-02-15 ENCOUNTER — Encounter: Payer: Self-pay | Admitting: Pulmonary Disease

## 2016-02-15 VITALS — BP 132/74 | HR 59 | Ht 65.0 in | Wt 267.6 lb

## 2016-02-15 DIAGNOSIS — R0600 Dyspnea, unspecified: Secondary | ICD-10-CM | POA: Diagnosis not present

## 2016-02-15 DIAGNOSIS — R0602 Shortness of breath: Secondary | ICD-10-CM

## 2016-02-15 NOTE — Progress Notes (Signed)
Subjective:    [name]: Lynn Joseph, female    DOB: 1945/08/25, 70 y.o.   MRN: [id number]  HPI  Chief Complaint  Patient presents with  . pulmonary consult    per Dr. Ardeth Perfect. pt c/o wheezing worsens during summer months, sob with exertion & occ chest discomfort [id number]    70 year old obese woman referred for evaluation of dyspnea on exertion she reports ongoing symptoms of December with onset of wheezing over the last few weeks.She moved from [place] to [place] about 3 years ago and denies seasonal allergies. She moves to [place] in the winter and lives in a condo on the second floor and a for shortness of breath with climbing stairs. She is also noted raspiness in her voice over the last couple of weeks. Her husband died a few years ago, he was a heavy smoker and she reports exposure to secondhand smoke. She also has chronic anemia. She underwent evaluation by an overnight polysomnogram in 01/2015 which did not show any evidence of obstructive sleep apnea, few PLM's were noted without significant arousals. Echo 09/2015 showed normal LV ejection fraction with RVSP of 31 and no valvular abnormalities She reports a history of gastric bypass in 1988-she developed symptoms suggestive of gastric outlet obstruction and underwent a Roux-en-Y bypass in 2009, this resolved her symptoms of heartburn and she denies significant breakthrough heartburn since then. She reports watery nasal discharge and occasional postnasal drip She has been unable to lose weight and in fact after her husband's death may have gained about 20-25 pounds   Spirometry showed no evidence of airway obstruction with normal lung function although this was a poor effort   she has no prior or family history of venous thromboembolus and       Past Medical History:  Diagnosis Date  . Anemia of chronic disease   . Chronic diarrhea   . Glaucoma   . HTN (hypertension)   . Hypothyroid   . Obesity   . Skin  cancer      Past Surgical History:  Procedure Laterality Date  . GASTRIC BYPASS    . TUBAL LIGATION       No Known Allergies   Social History   Social History  . Marital status: Widowed    Spouse name: N/A  . Number of children: N/A  . Years of education: N/A   Occupational History  . Not on file.   Social History Main Topics  . Smoking status: Never Smoker  . Smokeless tobacco: Never Used  . Alcohol use 0.0 oz/week     Comment: 1 glass of wine weekly  . Drug use: No  . Sexual activity: Not on file   Other Topics Concern  . Not on file   Social History Narrative   Drinks 1-2 cups of caffeine daily.     Family History  Problem Relation Age of Onset  . Hypertension Mother   . Thyroid disease Mother   . Stroke Mother   . Kidney disease Father   . Diabetes Father      Review of Systems  Constitutional: Negative for fever and unexpected weight change.  HENT: Negative for congestion, dental problem, ear pain, nosebleeds, postnasal drip, rhinorrhea, sinus pressure, sneezing, sore throat and trouble swallowing.   Eyes: Negative for redness and itching.  Respiratory: Positive for chest tightness, shortness of breath and wheezing. Negative for cough.   Cardiovascular: Positive for palpitations. Negative for leg swelling.  Gastrointestinal: Negative for nausea and vomiting.  Genitourinary: Negative for dysuria.  Musculoskeletal: Negative for joint swelling.  Skin: Negative for rash.  Neurological: Negative for headaches.  Hematological: Does not bruise/bleed easily.  Psychiatric/Behavioral: Negative for dysphoric mood. The patient is not nervous/anxious.        Objective:   Physical Exam  Gen. Pleasant, obese, in no distress, normal affect ENT - no lesions, no post nasal drip, class 2-3 airway Neck: No JVD, no thyromegaly, no carotid bruits Lungs: no use of accessory muscles, no dullness to percussion, decreased without rales or rhonchi  Cardiovascular:  Rhythm regular, heart sounds  normal, no murmurs or gallops, no peripheral edema Abdomen: soft and non-tender, no hepatosplenomegaly, BS normal. Musculoskeletal: No deformities, no cyanosis or clubbing Neuro:  alert, non focal, no tremors       Assessment & Plan:

## 2016-02-15 NOTE — Patient Instructions (Addendum)
Breathing test showed normal lung function Chest x-ray today

## 2016-02-15 NOTE — Assessment & Plan Note (Signed)
No clear cause identified at this time. She is no evidence of airway obstruction on spirometry, no evidence of interstitial lung disease on exam-we will obtain chest x-ray to clarify. She seems to be at low risk for venous thromboembolism. Her symptoms may simply be related to obesity and deconditioning, other contributing factors may be seasonal allergies or asymptomatic reflux  She will contact as in the future if symptoms worsen. I encouraged her to stay active and work on weight loss as the main interventions at this time

## 2016-02-16 ENCOUNTER — Telehealth: Payer: Self-pay | Admitting: Pulmonary Disease

## 2016-02-16 NOTE — Telephone Encounter (Signed)
Notes Recorded by Rigoberto Noel, MD on 02/15/2016 at 12:32 PM EDT No evidence of lung pathology ------------  Spoke with pt, aware of results/recs.  Nothing further needed.

## 2016-03-14 ENCOUNTER — Other Ambulatory Visit: Payer: Self-pay | Admitting: Internal Medicine

## 2016-03-14 DIAGNOSIS — Z1231 Encounter for screening mammogram for malignant neoplasm of breast: Secondary | ICD-10-CM

## 2016-04-18 ENCOUNTER — Ambulatory Visit: Payer: Medicare Other

## 2016-04-20 ENCOUNTER — Ambulatory Visit: Payer: Medicare Other

## 2016-07-12 DIAGNOSIS — L821 Other seborrheic keratosis: Secondary | ICD-10-CM | POA: Diagnosis not present

## 2016-07-12 DIAGNOSIS — L570 Actinic keratosis: Secondary | ICD-10-CM | POA: Diagnosis not present

## 2016-07-12 DIAGNOSIS — L304 Erythema intertrigo: Secondary | ICD-10-CM | POA: Diagnosis not present

## 2016-07-12 DIAGNOSIS — L814 Other melanin hyperpigmentation: Secondary | ICD-10-CM | POA: Diagnosis not present

## 2016-07-12 DIAGNOSIS — S20309A Unspecified superficial injuries of unspecified front wall of thorax, initial encounter: Secondary | ICD-10-CM | POA: Diagnosis not present

## 2016-07-12 DIAGNOSIS — L820 Inflamed seborrheic keratosis: Secondary | ICD-10-CM | POA: Diagnosis not present

## 2016-07-12 DIAGNOSIS — S20409A Unspecified superficial injuries of unspecified back wall of thorax, initial encounter: Secondary | ICD-10-CM | POA: Diagnosis not present

## 2016-07-12 DIAGNOSIS — D225 Melanocytic nevi of trunk: Secondary | ICD-10-CM | POA: Diagnosis not present

## 2016-07-12 DIAGNOSIS — R208 Other disturbances of skin sensation: Secondary | ICD-10-CM | POA: Diagnosis not present

## 2016-07-12 DIAGNOSIS — L538 Other specified erythematous conditions: Secondary | ICD-10-CM | POA: Diagnosis not present

## 2016-07-12 DIAGNOSIS — L298 Other pruritus: Secondary | ICD-10-CM | POA: Diagnosis not present

## 2016-09-03 DIAGNOSIS — I10 Essential (primary) hypertension: Secondary | ICD-10-CM | POA: Diagnosis not present

## 2016-09-03 DIAGNOSIS — E668 Other obesity: Secondary | ICD-10-CM | POA: Diagnosis not present

## 2016-09-03 DIAGNOSIS — Z1389 Encounter for screening for other disorder: Secondary | ICD-10-CM | POA: Diagnosis not present

## 2016-09-03 DIAGNOSIS — Z6841 Body Mass Index (BMI) 40.0 and over, adult: Secondary | ICD-10-CM | POA: Diagnosis not present

## 2016-09-03 DIAGNOSIS — R0609 Other forms of dyspnea: Secondary | ICD-10-CM | POA: Diagnosis not present

## 2016-09-10 ENCOUNTER — Other Ambulatory Visit: Payer: Self-pay | Admitting: Internal Medicine

## 2016-09-10 DIAGNOSIS — Z1231 Encounter for screening mammogram for malignant neoplasm of breast: Secondary | ICD-10-CM

## 2016-09-13 ENCOUNTER — Ambulatory Visit: Payer: Medicare Other

## 2016-09-26 ENCOUNTER — Ambulatory Visit
Admission: RE | Admit: 2016-09-26 | Discharge: 2016-09-26 | Disposition: A | Discharge status: Home / Self Care | Payer: Medicare Other | Source: Ambulatory Visit | Attending: Internal Medicine | Admitting: Internal Medicine

## 2016-09-26 DIAGNOSIS — Z1231 Encounter for screening mammogram for malignant neoplasm of breast: Secondary | ICD-10-CM | POA: Diagnosis not present

## 2017-01-01 DIAGNOSIS — H401231 Low-tension glaucoma, bilateral, mild stage: Secondary | ICD-10-CM | POA: Diagnosis not present

## 2017-02-05 DIAGNOSIS — E038 Other specified hypothyroidism: Secondary | ICD-10-CM | POA: Diagnosis not present

## 2017-02-05 DIAGNOSIS — I10 Essential (primary) hypertension: Secondary | ICD-10-CM | POA: Diagnosis not present

## 2017-02-12 DIAGNOSIS — Z23 Encounter for immunization: Secondary | ICD-10-CM | POA: Diagnosis not present

## 2017-02-12 DIAGNOSIS — M859 Disorder of bone density and structure, unspecified: Secondary | ICD-10-CM | POA: Diagnosis not present

## 2017-02-12 DIAGNOSIS — E038 Other specified hypothyroidism: Secondary | ICD-10-CM | POA: Diagnosis not present

## 2017-02-12 DIAGNOSIS — I10 Essential (primary) hypertension: Secondary | ICD-10-CM | POA: Diagnosis not present

## 2017-02-12 DIAGNOSIS — E611 Iron deficiency: Secondary | ICD-10-CM | POA: Diagnosis not present

## 2017-02-12 DIAGNOSIS — N183 Chronic kidney disease, stage 3 (moderate): Secondary | ICD-10-CM | POA: Diagnosis not present

## 2017-02-12 DIAGNOSIS — Z6841 Body Mass Index (BMI) 40.0 and over, adult: Secondary | ICD-10-CM | POA: Diagnosis not present

## 2017-02-12 DIAGNOSIS — E668 Other obesity: Secondary | ICD-10-CM | POA: Diagnosis not present

## 2017-02-12 DIAGNOSIS — Z1389 Encounter for screening for other disorder: Secondary | ICD-10-CM | POA: Diagnosis not present

## 2017-02-12 DIAGNOSIS — E538 Deficiency of other specified B group vitamins: Secondary | ICD-10-CM | POA: Diagnosis not present

## 2017-02-12 DIAGNOSIS — H4089 Other specified glaucoma: Secondary | ICD-10-CM | POA: Diagnosis not present

## 2017-02-12 DIAGNOSIS — Z0000 Encounter for general adult medical examination without abnormal findings: Secondary | ICD-10-CM | POA: Diagnosis not present

## 2017-02-12 DIAGNOSIS — I517 Cardiomegaly: Secondary | ICD-10-CM | POA: Diagnosis not present

## 2017-03-19 DIAGNOSIS — N183 Chronic kidney disease, stage 3 (moderate): Secondary | ICD-10-CM | POA: Diagnosis not present

## 2017-03-19 DIAGNOSIS — M859 Disorder of bone density and structure, unspecified: Secondary | ICD-10-CM | POA: Diagnosis not present

## 2017-07-17 DIAGNOSIS — L281 Prurigo nodularis: Secondary | ICD-10-CM | POA: Diagnosis not present

## 2017-07-17 DIAGNOSIS — L821 Other seborrheic keratosis: Secondary | ICD-10-CM | POA: Diagnosis not present

## 2017-07-17 DIAGNOSIS — L814 Other melanin hyperpigmentation: Secondary | ICD-10-CM | POA: Diagnosis not present

## 2017-07-17 DIAGNOSIS — Z08 Encounter for follow-up examination after completed treatment for malignant neoplasm: Secondary | ICD-10-CM | POA: Diagnosis not present

## 2017-07-17 DIAGNOSIS — L570 Actinic keratosis: Secondary | ICD-10-CM | POA: Diagnosis not present

## 2017-07-17 DIAGNOSIS — Z85828 Personal history of other malignant neoplasm of skin: Secondary | ICD-10-CM | POA: Diagnosis not present

## 2017-07-17 DIAGNOSIS — D225 Melanocytic nevi of trunk: Secondary | ICD-10-CM | POA: Diagnosis not present

## 2017-11-18 ENCOUNTER — Other Ambulatory Visit: Payer: Self-pay | Admitting: Internal Medicine

## 2017-11-18 DIAGNOSIS — Z1231 Encounter for screening mammogram for malignant neoplasm of breast: Secondary | ICD-10-CM

## 2017-12-12 ENCOUNTER — Ambulatory Visit
Admission: RE | Admit: 2017-12-12 | Discharge: 2017-12-12 | Disposition: A | Discharge status: Home / Self Care | Payer: Medicare Other | Source: Ambulatory Visit | Attending: Internal Medicine | Admitting: Internal Medicine

## 2017-12-12 DIAGNOSIS — Z1231 Encounter for screening mammogram for malignant neoplasm of breast: Secondary | ICD-10-CM

## 2018-01-07 DIAGNOSIS — H401231 Low-tension glaucoma, bilateral, mild stage: Secondary | ICD-10-CM | POA: Diagnosis not present

## 2018-02-04 DIAGNOSIS — H401231 Low-tension glaucoma, bilateral, mild stage: Secondary | ICD-10-CM | POA: Diagnosis not present

## 2018-02-07 DIAGNOSIS — R82998 Other abnormal findings in urine: Secondary | ICD-10-CM | POA: Diagnosis not present

## 2018-02-07 DIAGNOSIS — E038 Other specified hypothyroidism: Secondary | ICD-10-CM | POA: Diagnosis not present

## 2018-02-07 DIAGNOSIS — I10 Essential (primary) hypertension: Secondary | ICD-10-CM | POA: Diagnosis not present

## 2018-02-07 DIAGNOSIS — E538 Deficiency of other specified B group vitamins: Secondary | ICD-10-CM | POA: Diagnosis not present

## 2018-02-07 DIAGNOSIS — M859 Disorder of bone density and structure, unspecified: Secondary | ICD-10-CM | POA: Diagnosis not present

## 2018-02-25 DIAGNOSIS — R0683 Snoring: Secondary | ICD-10-CM | POA: Diagnosis not present

## 2018-02-25 DIAGNOSIS — Z23 Encounter for immunization: Secondary | ICD-10-CM | POA: Diagnosis not present

## 2018-02-25 DIAGNOSIS — Z1389 Encounter for screening for other disorder: Secondary | ICD-10-CM | POA: Diagnosis not present

## 2018-02-25 DIAGNOSIS — E611 Iron deficiency: Secondary | ICD-10-CM | POA: Diagnosis not present

## 2018-02-25 DIAGNOSIS — E038 Other specified hypothyroidism: Secondary | ICD-10-CM | POA: Diagnosis not present

## 2018-02-25 DIAGNOSIS — I129 Hypertensive chronic kidney disease with stage 1 through stage 4 chronic kidney disease, or unspecified chronic kidney disease: Secondary | ICD-10-CM | POA: Diagnosis not present

## 2018-02-25 DIAGNOSIS — D692 Other nonthrombocytopenic purpura: Secondary | ICD-10-CM | POA: Diagnosis not present

## 2018-02-25 DIAGNOSIS — M859 Disorder of bone density and structure, unspecified: Secondary | ICD-10-CM | POA: Diagnosis not present

## 2018-02-25 DIAGNOSIS — H4089 Other specified glaucoma: Secondary | ICD-10-CM | POA: Diagnosis not present

## 2018-02-25 DIAGNOSIS — Z0000 Encounter for general adult medical examination without abnormal findings: Secondary | ICD-10-CM | POA: Diagnosis not present

## 2018-02-25 DIAGNOSIS — Z6841 Body Mass Index (BMI) 40.0 and over, adult: Secondary | ICD-10-CM | POA: Diagnosis not present

## 2018-02-25 DIAGNOSIS — N183 Chronic kidney disease, stage 3 (moderate): Secondary | ICD-10-CM | POA: Diagnosis not present

## 2018-02-25 DIAGNOSIS — E538 Deficiency of other specified B group vitamins: Secondary | ICD-10-CM | POA: Diagnosis not present

## 2018-10-28 ENCOUNTER — Inpatient Hospital Stay: Payer: Medicare Other

## 2018-10-28 ENCOUNTER — Other Ambulatory Visit: Payer: Self-pay

## 2018-10-28 ENCOUNTER — Emergency Department: Payer: Medicare Other

## 2018-10-28 ENCOUNTER — Inpatient Hospital Stay
Admission: EM | Admit: 2018-10-28 | Discharge: 2018-10-30 | DRG: 291 | Disposition: A | Discharge status: Home Health | Payer: Medicare Other | Attending: Internal Medicine | Admitting: Internal Medicine

## 2018-10-28 ENCOUNTER — Inpatient Hospital Stay
Admit: 2018-10-28 | Discharge: 2018-10-28 | Disposition: A | Discharge status: Home / Self Care | Payer: Medicare Other | Attending: Nurse Practitioner | Admitting: Nurse Practitioner

## 2018-10-28 DIAGNOSIS — I130 Hypertensive heart and chronic kidney disease with heart failure and stage 1 through stage 4 chronic kidney disease, or unspecified chronic kidney disease: Principal | ICD-10-CM | POA: Diagnosis present

## 2018-10-28 DIAGNOSIS — K8020 Calculus of gallbladder without cholecystitis without obstruction: Secondary | ICD-10-CM | POA: Diagnosis not present

## 2018-10-28 DIAGNOSIS — E039 Hypothyroidism, unspecified: Secondary | ICD-10-CM | POA: Diagnosis present

## 2018-10-28 DIAGNOSIS — Z20828 Contact with and (suspected) exposure to other viral communicable diseases: Secondary | ICD-10-CM | POA: Diagnosis present

## 2018-10-28 DIAGNOSIS — R509 Fever, unspecified: Secondary | ICD-10-CM

## 2018-10-28 DIAGNOSIS — Z79899 Other long term (current) drug therapy: Secondary | ICD-10-CM | POA: Diagnosis not present

## 2018-10-28 DIAGNOSIS — I5033 Acute on chronic diastolic (congestive) heart failure: Secondary | ICD-10-CM | POA: Diagnosis present

## 2018-10-28 DIAGNOSIS — J189 Pneumonia, unspecified organism: Secondary | ICD-10-CM | POA: Diagnosis present

## 2018-10-28 DIAGNOSIS — J9801 Acute bronchospasm: Secondary | ICD-10-CM | POA: Diagnosis not present

## 2018-10-28 DIAGNOSIS — N179 Acute kidney failure, unspecified: Secondary | ICD-10-CM | POA: Diagnosis present

## 2018-10-28 DIAGNOSIS — I509 Heart failure, unspecified: Secondary | ICD-10-CM | POA: Diagnosis not present

## 2018-10-28 DIAGNOSIS — J40 Bronchitis, not specified as acute or chronic: Secondary | ICD-10-CM | POA: Diagnosis present

## 2018-10-28 DIAGNOSIS — K219 Gastro-esophageal reflux disease without esophagitis: Secondary | ICD-10-CM | POA: Diagnosis present

## 2018-10-28 DIAGNOSIS — R0602 Shortness of breath: Secondary | ICD-10-CM | POA: Diagnosis not present

## 2018-10-28 DIAGNOSIS — D72829 Elevated white blood cell count, unspecified: Secondary | ICD-10-CM | POA: Diagnosis not present

## 2018-10-28 DIAGNOSIS — Z9884 Bariatric surgery status: Secondary | ICD-10-CM | POA: Diagnosis not present

## 2018-10-28 DIAGNOSIS — Z87891 Personal history of nicotine dependence: Secondary | ICD-10-CM | POA: Diagnosis not present

## 2018-10-28 DIAGNOSIS — A419 Sepsis, unspecified organism: Secondary | ICD-10-CM

## 2018-10-28 DIAGNOSIS — R0689 Other abnormalities of breathing: Secondary | ICD-10-CM | POA: Diagnosis not present

## 2018-10-28 DIAGNOSIS — R062 Wheezing: Secondary | ICD-10-CM | POA: Diagnosis not present

## 2018-10-28 DIAGNOSIS — R079 Chest pain, unspecified: Secondary | ICD-10-CM | POA: Diagnosis not present

## 2018-10-28 DIAGNOSIS — J9601 Acute respiratory failure with hypoxia: Secondary | ICD-10-CM | POA: Diagnosis present

## 2018-10-28 DIAGNOSIS — R7989 Other specified abnormal findings of blood chemistry: Secondary | ICD-10-CM | POA: Diagnosis not present

## 2018-10-28 DIAGNOSIS — N189 Chronic kidney disease, unspecified: Secondary | ICD-10-CM | POA: Diagnosis present

## 2018-10-28 DIAGNOSIS — R0603 Acute respiratory distress: Secondary | ICD-10-CM | POA: Diagnosis not present

## 2018-10-28 DIAGNOSIS — Z209 Contact with and (suspected) exposure to unspecified communicable disease: Secondary | ICD-10-CM | POA: Diagnosis not present

## 2018-10-28 DIAGNOSIS — R1011 Right upper quadrant pain: Secondary | ICD-10-CM

## 2018-10-28 HISTORY — DX: Essential (primary) hypertension: I10

## 2018-10-28 HISTORY — DX: Heart failure, unspecified: I50.9

## 2018-10-28 LAB — LACTIC ACID, PLASMA: Lactic Acid, Venous: 1.9 mmol/L (ref 0.5–1.9)

## 2018-10-28 LAB — APTT: aPTT: 29 seconds (ref 24–36)

## 2018-10-28 LAB — COMPREHENSIVE METABOLIC PANEL
ALT: 85 U/L — ABNORMAL HIGH (ref 0–44)
AST: 187 U/L — ABNORMAL HIGH (ref 15–41)
Albumin: 4.1 g/dL (ref 3.5–5.0)
Alkaline Phosphatase: 164 U/L — ABNORMAL HIGH (ref 38–126)
Anion gap: 10 (ref 5–15)
BUN: 35 mg/dL — ABNORMAL HIGH (ref 8–23)
CO2: 20 mmol/L — ABNORMAL LOW (ref 22–32)
Calcium: 8.5 mg/dL — ABNORMAL LOW (ref 8.9–10.3)
Chloride: 110 mmol/L (ref 98–111)
Creatinine, Ser: 1.38 mg/dL — ABNORMAL HIGH (ref 0.44–1.00)
GFR calc Af Amer: 44 mL/min — ABNORMAL LOW (ref >60)
GFR calc non Af Amer: 38 mL/min — ABNORMAL LOW (ref >60)
Glucose, Bld: 131 mg/dL — ABNORMAL HIGH (ref 70–99)
Potassium: 4.8 mmol/L (ref 3.5–5.1)
Sodium: 140 mmol/L (ref 135–145)
Total Bilirubin: 0.8 mg/dL (ref 0.3–1.2)
Total Protein: 7.6 g/dL (ref 6.5–8.1)

## 2018-10-28 LAB — URINALYSIS, ROUTINE W REFLEX MICROSCOPIC
Bacteria, UA: NONE SEEN
Bilirubin Urine: NEGATIVE
Glucose, UA: NEGATIVE mg/dL
Hgb urine dipstick: NEGATIVE
Ketones, ur: NEGATIVE mg/dL
Nitrite: NEGATIVE
Protein, ur: NEGATIVE mg/dL
Specific Gravity, Urine: 1.016 (ref 1.005–1.030)
pH: 5 (ref 5.0–8.0)

## 2018-10-28 LAB — CBC WITH DIFFERENTIAL/PLATELET
Abs Immature Granulocytes: 0.07 10*3/uL (ref 0.00–0.07)
Basophils Absolute: 0 10*3/uL (ref 0.0–0.1)
Basophils Relative: 0 %
Eosinophils Absolute: 0 10*3/uL (ref 0.0–0.5)
Eosinophils Relative: 0 %
HCT: 32.9 % — ABNORMAL LOW (ref 36.0–46.0)
Hemoglobin: 10.4 g/dL — ABNORMAL LOW (ref 12.0–15.0)
Immature Granulocytes: 1 %
Lymphocytes Relative: 5 %
Lymphs Abs: 0.5 10*3/uL — ABNORMAL LOW (ref 0.7–4.0)
MCH: 28.1 pg (ref 26.0–34.0)
MCHC: 31.6 g/dL (ref 30.0–36.0)
MCV: 88.9 fL (ref 80.0–100.0)
Monocytes Absolute: 0.4 10*3/uL (ref 0.1–1.0)
Monocytes Relative: 5 %
Neutro Abs: 8 10*3/uL — ABNORMAL HIGH (ref 1.7–7.7)
Neutrophils Relative %: 89 %
Platelets: 130 10*3/uL — ABNORMAL LOW (ref 150–400)
RBC: 3.7 MIL/uL — ABNORMAL LOW (ref 3.87–5.11)
RDW: 13.4 % (ref 11.5–15.5)
WBC: 9 10*3/uL (ref 4.0–10.5)
nRBC: 0 % (ref 0.0–0.2)

## 2018-10-28 LAB — BLOOD GAS, VENOUS
Acid-base deficit: 5.6 mmol/L — ABNORMAL HIGH (ref 0.0–2.0)
Bicarbonate: 21.9 mmol/L (ref 20.0–28.0)
O2 Saturation: 12.1 %
Patient temperature: 37
pCO2, Ven: 50 mmHg (ref 44.0–60.0)
pH, Ven: 7.25 (ref 7.250–7.430)
pO2, Ven: <31 mmHg — CL (ref 32.0–45.0)

## 2018-10-28 LAB — BRAIN NATRIURETIC PEPTIDE: B Natriuretic Peptide: 126 pg/mL — ABNORMAL HIGH (ref 0.0–100.0)

## 2018-10-28 LAB — LIPID PANEL
Cholesterol: 160 mg/dL (ref 0–200)
HDL: 55 mg/dL (ref >40)
LDL Cholesterol: 92 mg/dL (ref 0–99)
Total CHOL/HDL Ratio: 2.9 RATIO
Triglycerides: 64 mg/dL (ref <150)
VLDL: 13 mg/dL (ref 0–40)

## 2018-10-28 LAB — MAGNESIUM: Magnesium: 1.3 mg/dL — ABNORMAL LOW (ref 1.7–2.4)

## 2018-10-28 LAB — PROTIME-INR
INR: 1 (ref 0.8–1.2)
Prothrombin Time: 13.4 seconds (ref 11.4–15.2)

## 2018-10-28 LAB — TROPONIN I (HIGH SENSITIVITY): Troponin I (High Sensitivity): 8 ng/L (ref <18)

## 2018-10-28 LAB — SARS CORONAVIRUS 2 BY RT PCR (HOSPITAL ORDER, PERFORMED IN ~~LOC~~ HOSPITAL LAB): SARS Coronavirus 2: NEGATIVE

## 2018-10-28 LAB — TSH: TSH: 0.185 u[IU]/mL — ABNORMAL LOW (ref 0.350–4.500)

## 2018-10-28 LAB — LIPASE, BLOOD: Lipase: 48 U/L (ref 11–51)

## 2018-10-28 LAB — PROCALCITONIN: Procalcitonin: 14.11 ng/mL

## 2018-10-28 LAB — CK: Total CK: 94 U/L (ref 38–234)

## 2018-10-28 MED ORDER — ADULT MULTIVITAMIN W/MINERALS CH
1.0000 | ORAL_TABLET | Freq: Every day | ORAL | Status: DC
  Administered 2018-10-28 – 2018-10-30 (×3): 1 via ORAL
  Filled 2018-10-28 (×3): qty 1

## 2018-10-28 MED ORDER — ACETAMINOPHEN 325 MG PO TABS
650.0000 mg | ORAL_TABLET | ORAL | Status: DC | PRN
  Administered 2018-10-28 – 2018-10-30 (×4): 650 mg via ORAL
  Filled 2018-10-28 (×5): qty 2

## 2018-10-28 MED ORDER — SODIUM CHLORIDE 0.9% FLUSH
3.0000 mL | Freq: Two times a day (BID) | INTRAVENOUS | Status: DC
  Administered 2018-10-28 – 2018-10-30 (×5): 3 mL via INTRAVENOUS

## 2018-10-28 MED ORDER — LEVOTHYROXINE SODIUM 125 MCG PO TABS
125.0000 ug | ORAL_TABLET | Freq: Every day | ORAL | Status: DC
  Administered 2018-10-28 – 2018-10-30 (×3): 125 ug via ORAL
  Filled 2018-10-28 (×3): qty 1

## 2018-10-28 MED ORDER — MAGNESIUM SULFATE 2 GM/50ML IV SOLN
2.0000 g | Freq: Once | INTRAVENOUS | Status: AC
  Administered 2018-10-28: 2 g via INTRAVENOUS
  Filled 2018-10-28: qty 50

## 2018-10-28 MED ORDER — SODIUM CHLORIDE 0.9 % IV SOLN
2.0000 g | INTRAVENOUS | Status: DC
  Administered 2018-10-28 – 2018-10-30 (×3): 2 g via INTRAVENOUS
  Filled 2018-10-28 (×2): qty 2
  Filled 2018-10-28 (×2): qty 20

## 2018-10-28 MED ORDER — ENOXAPARIN SODIUM 40 MG/0.4ML ~~LOC~~ SOLN
40.0000 mg | Freq: Two times a day (BID) | SUBCUTANEOUS | Status: DC
  Administered 2018-10-29: 40 mg via SUBCUTANEOUS
  Filled 2018-10-28 (×2): qty 0.4

## 2018-10-28 MED ORDER — SODIUM CHLORIDE 0.9 % IV SOLN: 250.0000 mL | INTRAVENOUS | Status: DC | PRN

## 2018-10-28 MED ORDER — ACETAMINOPHEN 500 MG PO TABS
1000.0000 mg | ORAL_TABLET | Freq: Once | ORAL | Status: AC
  Administered 2018-10-28: 1000 mg via ORAL
  Filled 2018-10-28: qty 2

## 2018-10-28 MED ORDER — ONDANSETRON HCL 4 MG/2ML IJ SOLN: 4.0000 mg | Freq: Four times a day (QID) | INTRAMUSCULAR | Status: DC | PRN

## 2018-10-28 MED ORDER — IPRATROPIUM-ALBUTEROL 0.5-2.5 (3) MG/3ML IN SOLN
3.0000 mL | Freq: Four times a day (QID) | RESPIRATORY_TRACT | Status: DC
  Administered 2018-10-28 – 2018-10-29 (×3): 3 mL via RESPIRATORY_TRACT
  Filled 2018-10-28 (×4): qty 3

## 2018-10-28 MED ORDER — METOPROLOL TARTRATE 50 MG PO TABS
100.0000 mg | ORAL_TABLET | Freq: Two times a day (BID) | ORAL | Status: DC
  Administered 2018-10-28 – 2018-10-30 (×5): 100 mg via ORAL
  Filled 2018-10-28 (×6): qty 2

## 2018-10-28 MED ORDER — SODIUM CHLORIDE 0.9 % IV SOLN
500.0000 mg | INTRAVENOUS | Status: DC
  Administered 2018-10-28 – 2018-10-30 (×3): 500 mg via INTRAVENOUS
  Filled 2018-10-28 (×4): qty 500

## 2018-10-28 MED ORDER — FUROSEMIDE 10 MG/ML IJ SOLN
20.0000 mg | Freq: Two times a day (BID) | INTRAMUSCULAR | Status: DC
  Administered 2018-10-28 – 2018-10-29 (×2): 20 mg via INTRAVENOUS
  Filled 2018-10-28 (×2): qty 2

## 2018-10-28 MED ORDER — TRIAMTERENE-HCTZ 37.5-25 MG PO TABS
1.0000 | ORAL_TABLET | Freq: Every day | ORAL | Status: DC
  Administered 2018-10-28 – 2018-10-30 (×3): 1 via ORAL
  Filled 2018-10-28 (×3): qty 1

## 2018-10-28 MED ORDER — SODIUM CHLORIDE 0.9% FLUSH: 3.0000 mL | INTRAVENOUS | Status: DC | PRN

## 2018-10-28 MED ORDER — CALCIUM CARBONATE-VITAMIN D 500-200 MG-UNIT PO TABS
1.0000 | ORAL_TABLET | Freq: Every day | ORAL | Status: DC
  Administered 2018-10-28 – 2018-10-30 (×3): 1 via ORAL
  Filled 2018-10-28 (×4): qty 1

## 2018-10-28 NOTE — ED Notes (Signed)
ED TO INPATIENT HANDOFF REPORT  ED Nurse Name and Phone #: Sam [phone]  S Name/Age/Gender Lynn Joseph 73 y.o. female Room/Bed: ED16A/ED16A  Code Status   Code Status: Full Code  Home/SNF/Other Home Patient oriented to: self, place, time and situation Is this baseline? Yes   Triage Complete: Triage complete  Chief Complaint sob  [facility] Pt [facility]. Main complaint SOB and leg swelling. Hx CHF. Denies COPD. Arrives on non rebreather althought oxygen was not low. Denies fever. Denies +COVID contacts.  Also c/o chest tightness since last night, states was burping. Takes lasix, unsure dose. Arrives labored and tachypneic.      Allergies No Known Allergies  Level of Care/Admitting Diagnosis ED Disposition    ED Disposition Condition [facility] Area: [place] [[id number]]  Level of Care: Telemetry [5]  Covid Evaluation: Confirmed COVID Negative  Diagnosis: SOB (shortness of breath) [675916]  Admitting Physician: Eula Flax  Attending Physician: Rufina Falco ACHIENG [[id number]]  Estimated length of stay: past midnight tomorrow  Certification:: I certify this patient will need inpatient services for at least 2 midnights  PT Class (Do Not Modify): Inpatient [101]  PT Acc Code (Do Not Modify): Private [1]       B Medical/Surgery History Past Medical History:  Diagnosis Date  . CHF (congestive heart failure) ([place])   . Hypertension    Past Surgical History:  Procedure Laterality Date  . GASTRIC RESTRICTION SURGERY       A IV Location/Drains/Wounds Patient Lines/Drains/Airways Status   Active Line/Drains/Airways    Name:   Placement date:   Placement time:   Site:   Days:   Peripheral IV 10/28/18 Right Forearm   10/28/18    0746    Forearm   less than 1          Intake/Output Last 24 hours  Intake/Output Summary (Last 24 hours) at 10/28/2018 1111 Last data filed at 10/28/2018  1007 Gross per 24 hour  Intake 351.72 ml  Output -  Net 351.72 ml    Labs/Imaging Results for orders placed or performed during the hospital encounter of 10/28/18 (from the past 48 hour(s))  Comprehensive metabolic panel     Status: Abnormal   Collection Time: 10/28/18  7:12 AM  Result Value Ref Range   Sodium 140 135 - 145 mmol/L   Potassium 4.8 3.5 - 5.1 mmol/L   Chloride 110 98 - 111 mmol/L   CO2 20 (L) 22 - 32 mmol/L   Glucose, Bld 131 (H) 70 - 99 mg/dL   BUN 35 (H) 8 - 23 mg/dL   Creatinine, Ser 1.38 (H) 0.44 - 1.00 mg/dL   Calcium 8.5 (L) 8.9 - 10.3 mg/dL   Total Protein 7.6 6.5 - 8.1 g/dL   Albumin 4.1 3.5 - 5.0 g/dL   AST 187 (H) 15 - 41 U/L   ALT 85 (H) 0 - 44 U/L   Alkaline Phosphatase 164 (H) 38 - 126 U/L   Total Bilirubin 0.8 0.3 - 1.2 mg/dL   GFR calc non Af Amer 38 (L) >60 mL/min   GFR calc Af Amer 44 (L) >60 mL/min   Anion gap 10 5 - 15    Comment: Performed at [facility], [address] [place], [place] [address]  CBC WITH DIFFERENTIAL     Status: Abnormal   Collection Time: 10/28/18  7:12 AM  Result Value Ref Range   WBC  9.0 4.0 - 10.5 K/uL   RBC 3.70 (L) 3.87 - 5.11 MIL/uL   Hemoglobin 10.4 (L) 12.0 - 15.0 g/dL   HCT 32.9 (L) 36.0 - 46.0 %   MCV 88.9 80.0 - 100.0 fL   MCH 28.1 26.0 - 34.0 pg   MCHC 31.6 30.0 - 36.0 g/dL   RDW 13.4 11.5 - 15.5 %   Platelets 130 (L) 150 - 400 K/uL   nRBC 0.0 0.0 - 0.2 %   Neutrophils Relative % 89 %   Neutro Abs 8.0 (H) 1.7 - 7.7 K/uL   Lymphocytes Relative 5 %   Lymphs Abs 0.5 (L) 0.7 - 4.0 K/uL   Monocytes Relative 5 %   Monocytes Absolute 0.4 0.1 - 1.0 K/uL   Eosinophils Relative 0 %   Eosinophils Absolute 0.0 0.0 - 0.5 K/uL   Basophils Relative 0 %   Basophils Absolute 0.0 0.0 - 0.1 K/uL   Immature Granulocytes 1 %   Abs Immature Granulocytes 0.07 0.00 - 0.07 K/uL    Comment: Performed at [facility], [place], [place] [address]  Troponin I (High Sensitivity)      Status: None   Collection Time: 10/28/18  7:12 AM  Result Value Ref Range   Troponin I (High Sensitivity) 8 <18 ng/L    Comment: (NOTE) Elevated high sensitivity troponin I (hsTnI) values and significant  changes across serial measurements may suggest ACS but many other  chronic and acute conditions are known to elevate hsTnI results.  Refer to the "Links" section for chest pain algorithms and additional  guidance. Performed at [facility], [address] [place], [place] [address]   Magnesium     Status: Abnormal   Collection Time: 10/28/18  7:12 AM  Result Value Ref Range   Magnesium 1.3 (L) 1.7 - 2.4 mg/dL    Comment: Performed at [facility], [address] [place], [place] [address]  Protime-INR     Status: None   Collection Time: 10/28/18  7:12 AM  Result Value Ref Range   Prothrombin Time 13.4 11.4 - 15.2 seconds   INR 1.0 0.8 - 1.2    Comment: (NOTE) INR goal varies based on device and disease states. Performed at [facility], [address] [place], [place] [address]   APTT     Status: None   Collection Time: 10/28/18  7:12 AM  Result Value Ref Range   aPTT 29 24 - 36 seconds    Comment: Performed at [facility], [address] [place], [place] [address]  Blood gas, venous     Status: Abnormal   Collection Time: 10/28/18  7:14 AM  Result Value Ref Range   pH, Ven 7.25 7.250 - 7.430   pCO2, Ven 50 44.0 - 60.0 mmHg   pO2, Ven <31.0 (LL) 32.0 - 45.0 mmHg   Bicarbonate 21.9 20.0 - 28.0 mmol/L   Acid-base deficit 5.6 (H) 0.0 - 2.0 mmol/L   O2 Saturation 12.1 %   Patient temperature 37.0    Sample type VENOUS     Comment: Performed at [facility], [address] [place], [place] [address]  Brain natriuretic peptide     Status: Abnormal   Collection Time: 10/28/18  7:20 AM  Result Value Ref Range   B Natriuretic Peptide 126.0 (H) 0.0 - 100.0 pg/mL    Comment: Performed at [facility], [address] [place], [place] [address]  SARS Coronavirus 2 (  CEPHEID- Performed in [facility] hospital lab), Hosp Order     Status: None   Collection Time: 10/28/18  7:20 AM   Specimen: Nasopharyngeal Swab  Result Value Ref Range   SARS Coronavirus 2 NEGATIVE NEGATIVE    Comment: (NOTE) If result is NEGATIVE SARS-CoV-2 target nucleic acids are NOT DETECTED. The SARS-CoV-2 RNA is generally detectable in upper and lower  respiratory specimens during the acute phase of infection. The lowest  concentration of SARS-CoV-2 viral copies this assay can detect is 250  copies / mL. A negative result does not preclude SARS-CoV-2 infection  and should not be used as the sole basis for treatment or other  patient management decisions.  A negative result may occur with  improper specimen collection / handling, submission of specimen other  than nasopharyngeal swab, presence of viral mutation(s) within the  areas targeted by this assay, and inadequate number of viral copies  (<250 copies / mL). A negative result must be combined with clinical  observations, patient history, and epidemiological information. If result is POSITIVE SARS-CoV-2 target nucleic acids are DETECTED. The SARS-CoV-2 RNA is generally detectable in upper and lower  respiratory specimens dur ing the acute phase of infection.  Positive  results are indicative of active infection with SARS-CoV-2.  Clinical  correlation with patient history and other diagnostic information is  necessary to determine patient infection status.  Positive results do  not rule out bacterial infection or co-infection with other viruses. If result is PRESUMPTIVE POSTIVE SARS-CoV-2 nucleic acids MAY BE PRESENT.   A presumptive positive result was obtained on the submitted specimen  and confirmed on repeat testing.  While 2019 novel coronavirus  (SARS-CoV-2) nucleic acids may be present in the submitted sample  additional confirmatory testing may be necessary for  epidemiological  and / or clinical management purposes  to differentiate between  SARS-CoV-2 and other Sarbecovirus currently known to infect humans.  If clinically indicated additional testing with an alternate test  methodology [phone]) is advised. The SARS-CoV-2 RNA is generally  detectable in upper and lower respiratory sp ecimens during the acute  phase of infection. The expected result is Negative. Fact Sheet for Patients:  StrictlyIdeas.no Fact Sheet for Healthcare Providers: BankingDealers.co.za This test is not yet approved or cleared by the [place] FDA and has been authorized for detection and/or diagnosis of SARS-CoV-2 by FDA under an Emergency Use Authorization (EUA).  This EUA will remain in effect (meaning this test can be used) for the duration of the COVID-19 declaration under Section 564(b)(1) of the Act, 21 U.S.C. section 360bbb-3(b)(1), unless the authorization is terminated or revoked sooner. Performed at [facility], [address] [place], [place] [address]   Lactic acid, plasma     Status: None   Collection Time: 10/28/18  8:51 AM  Result Value Ref Range   Lactic Acid, Venous 1.9 0.5 - 1.9 mmol/L    Comment: Performed at [facility], [address] [place], [place] [address]  Urinalysis, Routine w reflex microscopic     Status: Abnormal   Collection Time: 10/28/18  9:24 AM  Result Value Ref Range   Color, Urine YELLOW (A) YELLOW   APPearance HAZY (A) CLEAR   Specific Gravity, Urine 1.016 1.005 - 1.030   pH 5.0 5.0 - 8.0   Glucose, UA NEGATIVE NEGATIVE mg/dL   Hgb urine dipstick NEGATIVE NEGATIVE   Bilirubin Urine NEGATIVE NEGATIVE   Ketones, ur NEGATIVE NEGATIVE mg/dL   Protein, ur NEGATIVE NEGATIVE  mg/dL   Nitrite NEGATIVE NEGATIVE   Leukocytes,Ua TRACE (A) NEGATIVE   RBC / HPF 0-5 0 - 5 RBC/hpf   WBC, UA 6-10 0 - 5 WBC/hpf   Bacteria, UA NONE SEEN NONE SEEN   Squamous  Epithelial / LPF 0-5 0 - 5   Mucus PRESENT     Comment: Performed at [facility], [address] [place], [place] [address]   Dg Chest Port 1 View  Result Date: 10/28/2018 CLINICAL DATA:  Shortness of breath, leg swelling and chest tightness since last night, labored breathing, tachypnea, history CHF, hypertension, former smoker EXAM: PORTABLE CHEST 1 VIEW COMPARISON:  Portable exam 0724 hours without priors for comparison FINDINGS: Enlargement of cardiac silhouette with pulmonary vascular congestion. Mediastinal contours normal. Minimal central peribronchial thickening. Lungs clear. No infiltrate, pleural effusion or pneumothorax. Bones demineralized. IMPRESSION: Enlargement of cardiac silhouette with pulmonary vascular congestion. Minimal bronchitic changes without acute infiltrate. Electronically Signed   By: Lavonia Dana M.D.   On: 10/28/2018 08:12    Pending Labs Unresulted Labs (From admission, onward)    Start     Ordered   10/29/18 7628  Basic metabolic panel  Daily,   STAT     10/28/18 1056   10/29/18 0500  Procalcitonin  Daily,   STAT     10/28/18 1056   10/28/18 1053  Lipase, blood  Once,   STAT     10/28/18 1056   10/28/18 1052  Procalcitonin - Baseline  ONCE - STAT,   STAT     10/28/18 1056   10/28/18 0715  Blood culture (routine x 2)  BLOOD CULTURE X 2,   STAT     10/28/18 0714   10/28/18 0713  Urine culture  ONCE - STAT,   STAT     10/28/18 0714          Vitals/Pain Today's Vitals   10/28/18 0830 10/28/18 0900 10/28/18 0930 10/28/18 1000  BP: (!) 165/67 (!) 142/61 (!) 113/54   Pulse: 94 93 94   Resp: (!) 31 (!) 25 (!) 28   Temp:    (!) 101 F (38.3 C)  TempSrc:      SpO2: 100% 100% 97%   Weight:      Height:      PainSc:        Isolation Precautions Airborne and Contact precautions  Medications Medications  cefTRIAXone (ROCEPHIN) 2 g in sodium chloride 0.9 % 100 mL IVPB ( Intravenous Stopped 10/28/18 0848)  azithromycin (ZITHROMAX) 500 mg in  sodium chloride 0.9 % 250 mL IVPB ( Intravenous Stopped 10/28/18 0956)  metoprolol tartrate (LOPRESSOR) tablet 100 mg (has no administration in time range)  triamterene-hydrochlorothiazide (MAXZIDE-25) 37.5-25 MG per tablet 1 tablet (has no administration in time range)  levothyroxine (SYNTHROID) tablet 125 mcg (has no administration in time range)  calcium-vitamin D (OSCAL WITH D) 500-200 MG-UNIT per tablet 1 tablet (has no administration in time range)  multivitamin with minerals tablet 1 tablet (has no administration in time range)  sodium chloride flush (NS) 0.9 % injection 3 mL (has no administration in time range)  sodium chloride flush (NS) 0.9 % injection 3 mL (has no administration in time range)  0.9 %  sodium chloride infusion (has no administration in time range)  acetaminophen (TYLENOL) tablet 650 mg (has no administration in time range)  ondansetron (ZOFRAN) injection 4 mg (has no administration in time range)  furosemide (LASIX) injection 20 mg (has no administration in time range)  ipratropium-albuterol (DUONEB) 0.5-2.5 (3) MG/3ML nebulizer solution 3 mL (has no administration in time range)  magnesium sulfate IVPB 2 g 50 mL (has no administration in time range)  acetaminophen (TYLENOL) tablet 1,000 mg (1,000 mg Oral Given 10/28/18 0817)    Mobility walks Low fall risk   Focused Assessments Pulmonary Assessment Handoff:  Lung sounds:   O2 Device: Nasal Cannula        R Recommendations: See Admitting Provider Note  Report given to:   Additional Notes:

## 2018-10-28 NOTE — Progress Notes (Signed)
*  PRELIMINARY RESULTS* Echocardiogram 2D Echocardiogram has been performed.  Lynn Joseph 10/28/2018, 2:15 PM

## 2018-10-28 NOTE — Sepsis Progress Note (Signed)
Sepsis bundle tracking reviewed and complete

## 2018-10-28 NOTE — Plan of Care (Signed)
  Problem: Education: Goal: Knowledge of General Education information will improve Description: Including pain rating scale, medication(s)/side effects and non-pharmacologic comfort measures Outcome: Progressing   Problem: Activity: Goal: Risk for activity intolerance will decrease Outcome: Progressing   Problem: Pain Managment: Goal: General experience of comfort will improve Outcome: Progressing   Problem: Skin Integrity: Goal: Risk for impaired skin integrity will decrease Outcome: Progressing   Problem: Activity: Goal: Capacity to carry out activities will improve Outcome: Progressing

## 2018-10-28 NOTE — ED Triage Notes (Signed)
[facility]. Main complaint SOB and leg swelling. Hx CHF. Denies COPD. Arrives on non rebreather althought oxygen was not low. Denies fever. Denies +COVID contacts.  Also c/o chest tightness since last night, states was burping. Takes lasix, unsure dose. Arrives labored and tachypneic.

## 2018-10-28 NOTE — H&P (Signed)
[facility] [facility] NAME: Lynn Joseph    MR#:  [id number]  DATE OF BIRTH:  June 16, 1945  DATE OF ADMISSION:  10/28/2018  PRIMARY CARE PHYSICIAN: Velna Hatchet, MD   REQUESTING/REFERRING PHYSICIAN: Arnette Norris, MD  CHIEF COMPLAINT:   Chief Complaint  Patient presents with   Shortness of Breath   HISTORY OF PRESENT ILLNESS:  73 y.o. 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  Patient states that she woke up at 5 AM this morning with chills and fever, 1 hour later she developed coughing and wheezing.  Also endorses chest tightness for which she took 2 baby aspirin due to concern for possible MI.  She states over the last 3 to 4 months she has gained approximately 20 pounds and noticed swelling in her legs.  Denies other associated symptoms of nausea or vomiting, dizziness, headache, diaphoresis, or diarrhea.  She states she has history of kidney disease and has been intermittent episodes of bilateral flank pain.  She decided to come to the ED today due to worsening shortness of breath with chest tightness.  On arrival to the ED, She was febrile temp 102.2 with blood pressure 186/80 mm Hg and pulse rate 103 beats/min, Resp. 30. Oxygen saturation was in the upper 80s so she was placed on oxygen via nasal cannula. There were no focal neurological deficits; she was alert and oriented x 4 but with increased work of breathing.  Initial labs revealed BUN 35, creatinine 1.38, AST 187, ALT 85 alk phos 164, platelets 130, mag 1.3, BNP 126, UA trace leukocytes, ECG showed sinus rhythm of 51 beats per minute and the chest X-ray shows pulmonary vascular  congestion with minimal bronchitic changes without acute infiltrate. Patient is being admitted under hospitalist service for further management.  PAST MEDICAL HISTORY:   Past Medical History:  Diagnosis Date   CHF (congestive heart failure) ([place])    Hypertension     PAST SURGICAL HISTORY:   Past Surgical History:  Procedure Laterality Date   GASTRIC RESTRICTION SURGERY      SOCIAL HISTORY:   Social History   Tobacco Use   Smoking status: Former Smoker  Substance Use Topics   Alcohol use: Never    Frequency: Never    FAMILY HISTORY:  History reviewed. No pertinent family history.  DRUG ALLERGIES:  No Known Allergies  REVIEW OF SYSTEMS:   Review of Systems  Constitutional: Positive for chills and fever. Negative for malaise/fatigue and weight loss.       Weight gain  HENT: Negative for congestion, hearing loss and sore throat.   Eyes: Negative for blurred vision and double vision.  Respiratory: Positive for cough, shortness of breath and wheezing.   Cardiovascular: Positive for chest pain and leg swelling. Negative for palpitations and orthopnea.  Gastrointestinal: Positive for abdominal pain. Negative for diarrhea, nausea and vomiting.  Genitourinary: Positive for flank pain. Negative for dysuria and urgency.  Musculoskeletal: Positive for back pain. Negative for myalgias.  Skin: Negative for rash.  Neurological: Negative for dizziness, sensory change, speech change, focal weakness and headaches.  Psychiatric/Behavioral: Negative for depression.   MEDICATIONS AT HOME:   Prior to Admission medications   Medication Sig Start Date  End Date Taking? Authorizing Provider  Calcium Carbonate-Vitamin D (CALCIUM 600+D HIGH POTENCY) 600-400 MG-UNIT tablet Take 1 tablet by mouth daily.   Yes [provider]  metoprolol tartrate (LOPRESSOR) 100 MG tablet Take 100 mg by mouth 2 (two) times a day. 08/16/18  Yes [provider]  Multiple Vitamin  (MULTIVITAMIN WITH MINERALS) TABS tablet Take 1 tablet by mouth daily.   Yes [provider]  TIROSINT 125 MCG CAPS Take 125 mcg by mouth daily. 10/22/18  Yes [provider]  triamterene-hydrochlorothiazide (DYAZIDE) 37.5-25 MG capsule Take 1 capsule by mouth daily. 08/07/18  Yes [provider]  valsartan (DIOVAN) 160 MG tablet Take 160 mg by mouth daily. 05/27/18  Yes [provider]      VITAL SIGNS:  Blood pressure (!) 134/53, pulse 92, temperature 98.5 F (36.9 C), temperature source Oral, resp. rate 18, height '5\' 5"'$  (1.651 m), weight 119.3 kg, SpO2 100 %.  PHYSICAL EXAMINATION:   Physical Exam  GENERAL:  73 y.o.-year-old patient lying in the bed with no acute distress.  EYES: Pupils equal, round, reactive to light and accommodation. No scleral icterus. Extraocular muscles intact.  HEENT: Head atraumatic, normocephalic. Oropharynx and nasopharynx clear.  NECK:  Supple, no jugular venous distention. No thyroid enlargement, no tenderness.  LUNGS: Decreased breath sounds bilaterally, moderate wheezing and rales bilaterally. No ,rhonchi or crepitation. Mild use of accessory muscles of respiration.  CARDIOVASCULAR: S1, S2 normal. No murmurs, rubs, or gallops.  ABDOMEN: Soft, nontender, nondistended. Bowel sounds present. No organomegaly or mass.  EXTREMITIES:Bilateral pitting edema of lower legs. No cyanosis, or clubbing. No rash or lesions. + pedal pulses MUSCULOSKELETAL: Normal bulk, and power was 5+ grip and elbow, knee, and ankle flexion and extension bilaterally.  NEUROLOGIC:Alert and oriented x 3. CN 2-12 intact. Sensation to light touch and cold stimuli intact bilaterally. Finger to nose nl. Babinski is downgoing. DTR's (biceps, patellar, and achilles) 2+ and symmetric throughout. Gait not tested due to safety concern. PSYCHIATRIC: The patient is alert and oriented x 3.  SKIN: No obvious rash, lesion, or ulcer.   DATA REVIEWED:  LABORATORY PANEL:     CBC Recent Labs  Lab 10/28/18 0712  WBC 9.0  HGB 10.4*  HCT 32.9*  PLT 130*   ------------------------------------------------------------------------------------------------------------------  Chemistries  Recent Labs  Lab 10/28/18 0712  NA 140  K 4.8  CL 110  CO2 20*  GLUCOSE 131*  BUN 35*  CREATININE 1.38*  CALCIUM 8.5*  MG 1.3*  AST 187*  ALT 85*  ALKPHOS 164*  BILITOT 0.8   ------------------------------------------------------------------------------------------------------------------  Cardiac Enzymes No results for input(s): TROPONINI in the last 168 hours. ------------------------------------------------------------------------------------------------------------------  RADIOLOGY:  Dg Chest Port 1 View  Result Date: 10/28/2018 CLINICAL DATA:  Shortness of breath, leg swelling and chest tightness since last night, labored breathing, tachypnea, history CHF, hypertension, former smoker EXAM: PORTABLE CHEST 1 VIEW COMPARISON:  Portable exam 0724 hours without priors for comparison FINDINGS: Enlargement of cardiac silhouette with pulmonary vascular congestion. Mediastinal contours normal. Minimal central peribronchial thickening. Lungs clear. No infiltrate, pleural effusion or pneumothorax. Bones demineralized. IMPRESSION: Enlargement of cardiac silhouette with pulmonary vascular congestion. Minimal bronchitic changes without acute infiltrate. Electronically Signed   By: Lavonia Dana M.D.   On: 10/28/2018 08:12   US Abdomen Limited Ruq  Result Date: 10/28/2018 CLINICAL DATA:  Acute right upper quadrant abdominal pain. EXAM: ULTRASOUND ABDOMEN LIMITED RIGHT UPPER QUADRANT COMPARISON:  None. FINDINGS: Gallbladder: Minimal cholelithiasis is noted without gallbladder wall thickening or pericholecystic  fluid. No sonographic Murphy's sign is noted. Common bile duct: Diameter: 5 mm which is within normal limits. Liver: No focal lesion identified. Within normal limits in  parenchymal echogenicity. Portal vein is patent on color Doppler imaging with normal direction of blood flow towards the liver. IMPRESSION: Minimal cholelithiasis without evidence of cholecystitis. No other abnormality seen in the right upper quadrant of the abdomen. Electronically Signed   By: Marijo Conception M.D.   On: 10/28/2018 11:55    EKG:  EKG: there are no previous tracings available for comparison.  IMPRESSION AND PLAN:   73 y.o. 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  1. Acute respiratory failure with hypoxia - secondary to fluid overload - Admit to Telemetry unit - Supplemental O2, goal sat 88-92% - DUONebs Q 6 hours for SOB and wheezing  2.  Acute on chronic Diastolic Congestive Heart Failure: Acute presentation likely due to volume overload with associated symptoms of SOB, BLE edema and. BNP mildly elevated at 129 - Chest x-ray shows pulmonary vascular congestion    Last Echo 09/02/2007 , EF 65% - Afterload, Goal MAP <70: Hold Valsartan int he setting of AKI - Beta-Blockade: Metoprolol - Diuretics: Furosemide '20mg'$  IV BID. Diureses >1L negative per day until approach euvolemia / worsening renal function. - Echocardiogram pending - Low salt diet  - Check daily weight - Strict I&Os - CHF Teaching - Cardiology Consult  3. Leukocytosis  - Procalcitonin elevated 14.1 - UA negative - Urine and blood cultures pending - Chest x-ray as above, will repeat following diuresis - Empiric abx Ceftriaxone and Azithromycin  4. Elevated LFTs - US abdomen minimal cholelithiasis no evidence of cholecystitis, if persistent may consider CT - Check hepatitis panel - LDH - Lipid  profile, CK and TSH - Will continue to follow   5. AKI- unknown baseline, patient has remote hx of CKD  - Hold nephrotoxins - Continue to monitor   6. HTN- stable + Goal BP <140/90 - Beta-blocker: Metoprolol - ACE-Inhibitor: Will resume Diovan with improving kidney functions  7. Hypomagnesia - IV mag x1 - Will recheck in am  8. Hypothyroidism - Continue   9. DVT prophylaxis - Lovenox  10. GERD - Protonix   All the records are reviewed and case discussed with ED provider. Management plans discussed with the patient, family and they are in agreement.  CODE STATUS: FULL  TOTAL TIME TAKING CARE OF THIS PATIENT: 40 minutes.    on 10/28/2018 at 12:52 PM  Rufina Falco, DNP, FNP-BC [facility] Nurse Practitioner Between 7am to 6pm - Pager [phone]  After 6pm go to [email or web address] - password [email or web address] [facility]  Office  [phone]  CC: Primary care physician; Velna Hatchet, MD

## 2018-10-28 NOTE — ED Provider Notes (Signed)
[facility] Provider Note  ____________________________________________   First MD Initiated Contact with Patient 10/28/18 [phone]     (approximate)  I have reviewed the triage vital signs and the nursing notes.   HISTORY  Chief Complaint Shortness of Breath    HPI Lynn Joseph is a 73 y.o. female with CHF who presents with SOB. Pt endorses chest pain that started last night and then developed some SOB after. The SOB is now severe, constant, nothing makes it better, worse with ambulation. She is unsure how much lasix she takes at home.  She denies fever. Denies coronovirus contacts.        Patient says that she has noticed some increased weight on her lower the past few months.    Past Medical History:  Diagnosis Date  . CHF (congestive heart failure) ([place])   . Hypertension     There are no active problems to display for this patient.   Past Surgical History:  Procedure Laterality Date  . GASTRIC RESTRICTION SURGERY      Prior to Admission medications   Not on File    Allergies Patient has no known allergies.  History reviewed. No pertinent family history.  Social History Social History   Tobacco Use  . Smoking status: Former Smoker  Substance Use Topics  . Alcohol use: Never    Frequency: Never  . Drug use: Not on file      Review of Systems Constitutional: No fever/chills Eyes: No visual changes. ENT: No sore throat. Cardiovascular:positive chest pain, leg swelling  Respiratory: positive SOB Gastrointestinal: No abdominal pain.  No nausea, no vomiting.  No diarrhea.  No constipation. Genitourinary: Negative for dysuria. Musculoskeletal: Negative for back pain. Skin: Negative for rash. Neurological: Negative for headaches, focal weakness or numbness. All other ROS negative ____________________________________________   PHYSICAL EXAM:  VITAL SIGNS: .Blood pressure (!) 186/80, pulse (!) 103,  temperature (!) 102.2 F (39 C), temperature source Oral, resp. rate (!) 30, height 5\' 5"  (1.651 m), weight 120.2 kg, SpO2 100 %.  Constitutional: Alert and oriented. t increased WOB  Eyes: Conjunctivae are normal. EOMI. Head: Atraumatic. Nose: No congestion/rhinnorhea. Mouth/Throat: Mucous membranes are moist.   Neck: No stridor. Trachea Midline. FROM Cardiovascular: Normal rate, regular rhythm. Grossly normal heart sounds.  Good peripheral circulation. 1+ edema bilaterally  Respiratory: increased WOB, bilateral wheezing, crackles  Gastrointestinal: Soft and nontender. No distention. No abdominal bruits.  Musculoskeletal: No lower extremity tenderness, 1+ edema bilateral,  No joint effusions. Neurologic:  Normal speech and language. No gross focal neurologic deficits are appreciated.  Skin:  Skin is warm, dry and intact. No rash noted. Psychiatric: Mood and affect are normal. Speech and behavior are normal. GU: Deferred   ____________________________________________   LABS (all labs ordered are listed, but only abnormal results are displayed)  Labs Reviewed  URINE CULTURE  SARS CORONAVIRUS 2 ([facility])  CULTURE, BLOOD (ROUTINE X 2)  CULTURE, BLOOD (ROUTINE X 2)  COMPREHENSIVE METABOLIC PANEL  CBC WITH DIFFERENTIAL/PLATELET  BRAIN NATRIURETIC PEPTIDE  URINALYSIS, ROUTINE W REFLEX MICROSCOPIC  TROPONIN I (HIGH SENSITIVITY)  TROPONIN I (HIGH SENSITIVITY)  MAGNESIUM  PROTIME-INR  APTT  BLOOD GAS, VENOUS  LACTIC ACID, PLASMA  LACTIC ACID, PLASMA   ____________________________________________   ED ECG REPORT I, Lynn Joseph, the attending physician, personally viewed and interpreted this ECG.  EKG sinus tachycardia rate of 101, no ST elevation, no T wave inversions, normal intervals.  ____________________________________________  [place], personally viewed and evaluated these images (plain radiographs) as  part of my medical decision making, as well as reviewing the written report by the radiologist.  ED MD interpretation: Chest x-ray with may be some mild pulmonary edema but no large infiltrate.  Official radiology report(s): Dg Chest Port 1 View  Result Date: 10/28/2018 CLINICAL DATA:  Shortness of breath, leg swelling and chest tightness since last night, labored breathing, tachypnea, history CHF, hypertension, former smoker EXAM: PORTABLE CHEST 1 VIEW COMPARISON:  Portable exam 0724 hours without priors for comparison FINDINGS: Enlargement of cardiac silhouette with pulmonary vascular congestion. Mediastinal contours normal. Minimal central peribronchial thickening. Lungs clear. No infiltrate, pleural effusion or pneumothorax. Bones demineralized. IMPRESSION: Enlargement of cardiac silhouette with pulmonary vascular congestion. Minimal bronchitic changes without acute infiltrate. Electronically Signed   By: Lynn Joseph M.D.   On: 10/28/2018 08:12    ____________________________________________   PROCEDURES  Procedure(s) performed (including Critical Care):  .Critical Care Performed by: Lynn Kild[place], MD Authorized by: Lynn Lima, MD   Critical care provider statement:    Critical care time (minutes):  30   Critical care was necessary to treat or prevent imminent or life-threatening deterioration of the following conditions:  Sepsis   Critical care was time spent personally by me on the following activities:  Discussions with consultants, evaluation of patient's response to treatment, examination of patient, ordering and performing treatments and interventions, ordering and review of laboratory studies, ordering and review of radiographic studies, pulse oximetry, re-evaluation of patient's condition, obtaining history from patient or surrogate and review of old charts     ____________________________________________   INITIAL IMPRESSION / [place] / ED COURSE   Lynn Joseph was evaluated in Emergency Department on 10/28/2018 for the symptoms described in the history of present illness. She was evaluated in the context of the global COVID-19 pandemic, which necessitated consideration that the patient might be at risk for infection with the SARS-CoV-2 virus that causes COVID-19. Institutional protocols and algorithms that pertain to the evaluation of patients at risk for COVID-19 are in a state of rapid change based on information released by regulatory bodies including the CDC and federal and state organizations. These policies and algorithms were followed during the patient's care in the ED.   Although patient denied having a fever patient was febrile and tachycardic upon examination This is most concerning for sepsis.  Given patient's shortness of breath will start on coverage for PNA with ceftriaxone, azithro. We also swab for covid 19.  Holding on fluid given history of CHF and on lasix and appears slightly overloaded on exam.  No history of COPD.  SEPSIS alert called.   Clinical Course as of Oct 27 1704  Tue Oct 28, 2018  0833 Labs notable for a PCO2 of 50.  And a pH of 7.25.  Will hold off on BiPAP. LFT slightly elevated however patient had no abdominal pain.   [MF]  0932 Covid test negative but given high suspicion we will keep her on precautions.  Given her normal chest x-ray as well as her leukopenia.  Discussed with medicine for admission.   [MF]    Clinical Course User Index [MF] Lynn Canal [place], MD    Admitted to medicine for community-acquired pneumonia ____________________________________________   FINAL CLINICAL IMPRESSION(S) / ED DIAGNOSES   Final diagnoses:  Acute respiratory distress  Community acquired pneumonia, unspecified laterality      MEDICATIONS GIVEN DURING  THIS VISIT:  Medications  acetaminophen (TYLENOL) tablet 1,000 mg (has no administration in time range)  cefTRIAXone (ROCEPHIN) 2 g in sodium chloride 0.9 % 100 mL  IVPB (has no administration in time range)  azithromycin (ZITHROMAX) 500 mg in sodium chloride 0.9 % 250 mL IVPB (has no administration in time range)     ED Discharge Orders    None       Note:  This document was prepared using Dragon voice recognition software and may include unintentional dictation errors.   Lynn [place], MD 10/28/18 1710

## 2018-10-28 NOTE — Progress Notes (Signed)
CODE SEPSIS - PHARMACY COMMUNICATION  **Broad Spectrum Antibiotics should be administered within 1 hour of Sepsis diagnosis**  Time Code Sepsis Called/Page Received: 0725  Antibiotics Ordered: ceftriaxone/azithromycin  Time of 1st antibiotic administration: 0817  Additional action taken by pharmacy: Called RN  If necessary, Name of Provider/Nurse Contacted:     Tawnya Crook ,PharmD Clinical Pharmacist  10/28/2018  8:27 AM

## 2018-10-28 NOTE — ED Notes (Signed)
Pt currently in [name] and exam complete. EDT [facility] to transport pt from [name] to 2A[facility]233. [facility]A called and informed that pt is on way up to room 233.

## 2018-10-29 LAB — HEPATIC FUNCTION PANEL
ALT: 136 U/L — ABNORMAL HIGH (ref 0–44)
AST: 151 U/L — ABNORMAL HIGH (ref 15–41)
Albumin: 3.4 g/dL — ABNORMAL LOW (ref 3.5–5.0)
Alkaline Phosphatase: 149 U/L — ABNORMAL HIGH (ref 38–126)
Bilirubin, Direct: 0.3 mg/dL — ABNORMAL HIGH (ref 0.0–0.2)
Indirect Bilirubin: 0.7 mg/dL (ref 0.3–0.9)
Total Bilirubin: 1 mg/dL (ref 0.3–1.2)
Total Protein: 6.4 g/dL — ABNORMAL LOW (ref 6.5–8.1)

## 2018-10-29 LAB — BASIC METABOLIC PANEL
Anion gap: 8 (ref 5–15)
BUN: 34 mg/dL — ABNORMAL HIGH (ref 8–23)
CO2: 20 mmol/L — ABNORMAL LOW (ref 22–32)
Calcium: 8.2 mg/dL — ABNORMAL LOW (ref 8.9–10.3)
Chloride: 110 mmol/L (ref 98–111)
Creatinine, Ser: 1.72 mg/dL — ABNORMAL HIGH (ref 0.44–1.00)
GFR calc Af Amer: 34 mL/min — ABNORMAL LOW (ref >60)
GFR calc non Af Amer: 29 mL/min — ABNORMAL LOW (ref >60)
Glucose, Bld: 108 mg/dL — ABNORMAL HIGH (ref 70–99)
Potassium: 4.1 mmol/L (ref 3.5–5.1)
Sodium: 138 mmol/L (ref 135–145)

## 2018-10-29 LAB — CBC WITH DIFFERENTIAL/PLATELET
Abs Immature Granulocytes: 0.03 10*3/uL (ref 0.00–0.07)
Basophils Absolute: 0 10*3/uL (ref 0.0–0.1)
Basophils Relative: 0 %
Eosinophils Absolute: 0.1 10*3/uL (ref 0.0–0.5)
Eosinophils Relative: 1 %
HCT: 27.5 % — ABNORMAL LOW (ref 36.0–46.0)
Hemoglobin: 8.6 g/dL — ABNORMAL LOW (ref 12.0–15.0)
Immature Granulocytes: 0 %
Lymphocytes Relative: 6 %
Lymphs Abs: 0.5 10*3/uL — ABNORMAL LOW (ref 0.7–4.0)
MCH: 27.8 pg (ref 26.0–34.0)
MCHC: 31.3 g/dL (ref 30.0–36.0)
MCV: 89 fL (ref 80.0–100.0)
Monocytes Absolute: 0.5 10*3/uL (ref 0.1–1.0)
Monocytes Relative: 6 %
Neutro Abs: 6.8 10*3/uL (ref 1.7–7.7)
Neutrophils Relative %: 87 %
Platelets: 125 10*3/uL — ABNORMAL LOW (ref 150–400)
RBC: 3.09 MIL/uL — ABNORMAL LOW (ref 3.87–5.11)
RDW: 13.9 % (ref 11.5–15.5)
WBC: 7.9 10*3/uL (ref 4.0–10.5)
nRBC: 0 % (ref 0.0–0.2)

## 2018-10-29 LAB — ECHOCARDIOGRAM COMPLETE
Height: 65 in
Weight: 4206.4 oz

## 2018-10-29 LAB — URINE CULTURE

## 2018-10-29 LAB — MAGNESIUM: Magnesium: 1.8 mg/dL (ref 1.7–2.4)

## 2018-10-29 LAB — PROCALCITONIN: Procalcitonin: 27.53 ng/mL

## 2018-10-29 MED ORDER — IPRATROPIUM-ALBUTEROL 20-100 MCG/ACT IN AERS
1.0000 | INHALATION_SPRAY | Freq: Four times a day (QID) | RESPIRATORY_TRACT | Status: DC | PRN
  Filled 2018-10-29: qty 4

## 2018-10-29 MED ORDER — IPRATROPIUM-ALBUTEROL 0.5-2.5 (3) MG/3ML IN SOLN: 3.0000 mL | Freq: Four times a day (QID) | RESPIRATORY_TRACT | Status: DC | PRN

## 2018-10-29 NOTE — Progress Notes (Signed)
[facility] [place] NAME: Lynn Joseph    MR#:  [id number]  DATE OF BIRTH:  [birth year]  SUBJECTIVE:  CHIEF COMPLAINT:   Chief Complaint  Patient presents with  . Shortness of Breath   Came with fever chills shortness of breath and cough and confusion Much improved today.  Fully alert and oriented and was on room air.  REVIEW OF SYSTEMS:  CONSTITUTIONAL: No fever, fatigue or weakness.  EYES: No blurred or double vision.  EARS, NOSE, AND THROAT: No tinnitus or ear pain.  RESPIRATORY: No cough, shortness of breath, wheezing or hemoptysis.  CARDIOVASCULAR: No chest pain, orthopnea, edema.  GASTROINTESTINAL: No nausea, vomiting, diarrhea or abdominal pain.  GENITOURINARY: No dysuria, hematuria.  ENDOCRINE: No polyuria, nocturia,  HEMATOLOGY: No anemia, easy bruising or bleeding SKIN: No rash or lesion. MUSCULOSKELETAL: No joint pain or arthritis.   NEUROLOGIC: No tingling, numbness, weakness.  PSYCHIATRY: No anxiety or depression.   ROS  DRUG ALLERGIES:  No Known Allergies  VITALS:  Blood pressure (!) 130/45, pulse 68, temperature 98.5 F (36.9 C), temperature source Oral, resp. rate 19, height 5\' 5"  (1.651 m), weight 119.2 kg, SpO2 98 %.  PHYSICAL EXAMINATION:  GENERAL:  73 y.o.-year-old patient lying in the bed with no acute distress.  EYES: Pupils equal, round, reactive to light and accommodation. No scleral icterus. Extraocular muscles intact.  HEENT: Head atraumatic, normocephalic. Oropharynx and nasopharynx clear.  NECK:  Supple, no jugular venous distention. No thyroid enlargement, no tenderness.  LUNGS: Normal breath sounds bilaterally, no wheezing, some crepitation. No use of accessory muscles of respiration.  CARDIOVASCULAR: S1, S2 normal. No murmurs, rubs, or gallops.  ABDOMEN: Soft, nontender, nondistended. Bowel sounds present. No organomegaly or mass.  EXTREMITIES: No pedal edema, cyanosis, or clubbing.   NEUROLOGIC: Cranial nerves II through XII are intact. Muscle strength 5/5 in all extremities. Sensation intact. Gait not checked.  PSYCHIATRIC: The patient is alert and oriented x 3.  SKIN: No obvious rash, lesion, or ulcer.   Physical Exam LABORATORY PANEL:   CBC Recent Labs  Lab 10/29/18 0659  WBC 7.9  HGB 8.6*  HCT 27.5*  PLT 125*   ------------------------------------------------------------------------------------------------------------------  Chemistries  Recent Labs  Lab 10/29/18 0659 10/29/18 0913  NA 138  --   K 4.1  --   CL 110  --   CO2 20*  --   GLUCOSE 108*  --   BUN 34*  --   CREATININE 1.72*  --   CALCIUM 8.2*  --   MG 1.8  --   AST  --  151*  ALT  --  136*  ALKPHOS  --  149*  BILITOT  --  1.0   ------------------------------------------------------------------------------------------------------------------  Cardiac Enzymes No results for input(s): TROPONINI in the last 168 hours. ------------------------------------------------------------------------------------------------------------------  RADIOLOGY:  Dg Chest 2 View  Result Date: 10/28/2018 CLINICAL DATA:  Shortness of breath, wheezing today, CHF, hypertension, former smoker EXAM: CHEST - 2 VIEW COMPARISON:  Earlier study of 10/28/2018 at 0715 hours FINDINGS: Probable enlargement of cardiac silhouette. Mediastinal contours and pulmonary vascularity normal. Minimal peribronchial thickening. No infiltrate, pleural effusion or pneumothorax. Bones demineralized. IMPRESSION: Minimal enlargement of cardiac silhouette and bronchitic changes without acute infiltrate. Electronically Signed   By: Lavonia Dana M.D.   On: 10/28/2018 15:56   Dg Chest Port 1 View  Result Date: 10/28/2018 CLINICAL DATA:  Shortness of breath, leg swelling and chest tightness since last night, labored breathing, tachypnea, history CHF,  hypertension, former smoker EXAM: PORTABLE CHEST 1 VIEW COMPARISON:  Portable exam 0724 hours  without priors for comparison FINDINGS: Enlargement of cardiac silhouette with pulmonary vascular congestion. Mediastinal contours normal. Minimal central peribronchial thickening. Lungs clear. No infiltrate, pleural effusion or pneumothorax. Bones demineralized. IMPRESSION: Enlargement of cardiac silhouette with pulmonary vascular congestion. Minimal bronchitic changes without acute infiltrate. Electronically Signed   By: Lavonia Dana M.D.   On: 10/28/2018 08:12   US Abdomen Limited Ruq  Result Date: 10/28/2018 CLINICAL DATA:  Acute right upper quadrant abdominal pain. EXAM: ULTRASOUND ABDOMEN LIMITED RIGHT UPPER QUADRANT COMPARISON:  None. FINDINGS: Gallbladder: Minimal cholelithiasis is noted without gallbladder wall thickening or pericholecystic fluid. No sonographic Murphy's sign is noted. Common bile duct: Diameter: 5 mm which is within normal limits. Liver: No focal lesion identified. Within normal limits in parenchymal echogenicity. Portal vein is patent on color Doppler imaging with normal direction of blood flow towards the liver. IMPRESSION: Minimal cholelithiasis without evidence of cholecystitis. No other abnormality seen in the right upper quadrant of the abdomen. Electronically Signed   By: Marijo Conception M.D.   On: 10/28/2018 11:55    ASSESSMENT AND PLAN:   Active Problems:   SOB (shortness of breath)  73 y.o. 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  1. Acute respiratory failure with hypoxia - secondary to fluid overload and bronchitis. - Supplemental O2, goal sat 88-92% - DUONebs Q 6 hours for SOB and wheezing. -She improved and on room air now.  2.  Acute Diastolic  Congestive Heart Failure: Acute presentation likely due to volume overload due to infection  BNP mildly elevated at 129 - Chest x-ray shows pulmonary vascular congestion  Last Echo 09/02/2007 , EF 65% - Afterload, Goal MAP <70:Hold Valsartan int he setting of AKI - Beta-Blockade: Metoprolol - Diuretics: Furosemide 20mg  IVBID. Diureses >1L negative per day until approach euvolemia / worsening renal function. - Echocardiogram done, EF 60% and no valvular abnormality. - Low salt diet - Check daily weight - Strict I&Os -We will stop Lasix now as renal function slightly worse.  3. Leukocytosis  - Procalcitonin elevated 14.1 - UA negative - Urine and blood cultures negative. - Chest x-ray as above, will repeat following diuresis - Empiric abx Ceftriaxone and Azithromycin -Her COVID test was negative on admission.  We would do Respi panel.  4. Elevated LFTs - US abdomen minimal cholelithiasis no evidence of cholecystitis -LFTs are coming down on further follow-up. -She does not have tenderness in right upper quadrant. -We will follow LFT again tomorrow but no further work-up needed.  5. AKI- unknown baseline, patient has remote hx of CKD  - Hold nephrotoxins - Continue to monitor-slight worse with Lasix use. -Monitor again tomorrow.  Encourage patient to drink some more liquid today.  6.HTN- stable + Goal BP <140/90 - Beta-blocker: Metoprolol - ACE-Inhibitor: Will resume Diovan with improving kidney functions  7. Hypomagnesia - IV mag x1 - Improved.  8. Hypothyroidism - Continue, TSH normal/.  9. DVT prophylaxis - Lovenox  10. GERD - Protonix    All the records are reviewed and case discussed with Care Management/Social Workerr. Management plans discussed with the patient, family and they are in agreement.  CODE STATUS: Full.  TOTAL TIME  TAKING CARE OF THIS PATIENT: 35 minutes.     POSSIBLE D/C IN 1-2 DAYS, DEPENDING ON CLINICAL  CONDITION.   Vaughan Basta M.D on 10/29/2018   Between 7am to 6pm - Pager - [phone]  After 6pm go to [email or web address] - password [email or web address] [facility]  Office  [phone]  CC: Primary care physician; Velna Hatchet, MD  Note: This dictation was prepared with Dragon dictation along with smaller phrase technology. Any transcriptional errors that result from this process are unintentional.

## 2018-10-29 NOTE — TOC Progression Note (Signed)
Transition of Care [facility]) - Progression Note    Patient Details  Name: Lynn Joseph MRN: [id number] Date of Birth: 07-18-1945  Transition of Care [facility]) CM/SW Contact  Shade Flood, LCSW Phone Number: 10/29/2018, 3:58 PM  Clinical Narrative:     Received return call from pt stating she decided that she would like to have [place] at dc. Reviewed [facility] provider options with pt and referred to [facility] at pt request. Sarah at [place] states that she will discuss with scheduling and return call to inform if they are staffed to be able to accept pt. Will follow.  Expected Discharge Plan: [place] Barriers to Discharge: Continued Medical Work up  Expected Discharge Plan and Services Expected Discharge Plan: [place] In-house Referral: Clinical Social Work     Living arrangements for the past 2 months: Single Family Home                                       Social Determinants of Health (SDOH) Interventions    Readmission Risk Interventions Readmission Risk Prevention Plan 10/29/2018  Medication Screening Complete  Transportation Screening Complete

## 2018-10-29 NOTE — Progress Notes (Signed)
Pt reswabbed for covid. Spec hand carried to lab.

## 2018-10-29 NOTE — TOC Initial Note (Signed)
Transition of Care [facility]) - Initial/Assessment Note    Patient Details  Name: Lynn Joseph MRN: [id number] Date of Birth: February 12, 1946  Transition of Care [facility]) CM/SW Contact:    Shade Flood, LCSW Phone Number: 10/29/2018, 3:11 PM  Clinical Narrative:                  Pt admitted from home. Received TOC consult to assess for [facility] RN needs. Spoke with pt by phone due to droplet precautions in place. HHC offered. Pt stating she would like to call her daughter and then let LCSW know.   Pt has been independent in ADLs at home. She states that she does not have any difficulty getting to appointments or obtaining medications.   Pt is not anticipating any other TOC needs for dc. TOC will follow and refer to [facility] if pt decides she wants it.  Expected Discharge Plan: [place] Barriers to Discharge: Continued Medical Work up   Patient Goals and CMS Choice Patient states their goals for this hospitalization and ongoing recovery are:: Return home to prior level of function CMS Medicare.gov Compare Post Acute Care list provided to:: Patient Choice offered to / list presented to : Patient  Expected Discharge Plan and Services Expected Discharge Plan: [place] In-house Referral: Clinical Social Work     Living arrangements for the past 2 months: [place]                                      Prior Living Arrangements/Services Living arrangements for the past 2 months: Single Family Home Lives with:: Self Patient language and need for interpreter reviewed:: Yes Do you feel safe going back to the place where you live?: Yes      Need for Family Participation in Patient Care: No (Comment) Care giver support system in place?: Yes (comment)   Criminal Activity/Legal Involvement Pertinent to Current Situation/Hospitalization: No - Comment as needed  Activities of Daily Living Home Assistive Devices/Equipment: None ADL Screening  (condition at time of admission) Patient's cognitive ability adequate to safely complete daily activities?: Yes Is the patient deaf or have difficulty hearing?: No Does the patient have difficulty seeing, even when wearing glasses/contacts?: No Does the patient have difficulty concentrating, remembering, or making decisions?: No Patient able to express need for assistance with ADLs?: Yes Does the patient have difficulty dressing or bathing?: No Independently performs ADLs?: Yes (appropriate for developmental age) Does the patient have difficulty walking or climbing stairs?: No Weakness of Legs: None Weakness of Arms/Hands: None  Permission Sought/Granted                  Emotional Assessment Appearance:: Appears stated age Attitude/Demeanor/Rapport: Engaged Affect (typically observed): Pleasant Orientation: : Oriented to Self, Oriented to Place, Oriented to  Time, Oriented to Situation Alcohol / Substance Use: Not Applicable Psych Involvement: No (comment)  Admission diagnosis:  Acute respiratory distress [R06.03] Abdominal pain, RUQ [R10.11] Community acquired pneumonia, unspecified laterality [J18.9] Patient Active Problem List   Diagnosis Date Noted  . SOB (shortness of breath) 10/28/2018   PCP:  Velna Hatchet, MD Pharmacy:   [facility] [place] [address] Phone: [phone] Fax: [phone]     Social Determinants of Health (SDOH) Interventions    Readmission Risk Interventions Readmission Risk  Prevention Plan 10/29/2018  Medication Screening Complete  Transportation Screening Complete

## 2018-10-30 ENCOUNTER — Inpatient Hospital Stay: Payer: Medicare Other

## 2018-10-30 LAB — RESPIRATORY PANEL BY PCR

## 2018-10-30 LAB — COMPREHENSIVE METABOLIC PANEL
ALT: 137 U/L — ABNORMAL HIGH (ref 0–44)
AST: 111 U/L — ABNORMAL HIGH (ref 15–41)
Albumin: 3.5 g/dL (ref 3.5–5.0)
Alkaline Phosphatase: 181 U/L — ABNORMAL HIGH (ref 38–126)
Anion gap: 11 (ref 5–15)
BUN: 33 mg/dL — ABNORMAL HIGH (ref 8–23)
CO2: 23 mmol/L (ref 22–32)
Calcium: 8.6 mg/dL — ABNORMAL LOW (ref 8.9–10.3)
Chloride: 106 mmol/L (ref 98–111)
Creatinine, Ser: 1.67 mg/dL — ABNORMAL HIGH (ref 0.44–1.00)
GFR calc Af Amer: 35 mL/min — ABNORMAL LOW (ref >60)
GFR calc non Af Amer: 30 mL/min — ABNORMAL LOW (ref >60)
Glucose, Bld: 105 mg/dL — ABNORMAL HIGH (ref 70–99)
Potassium: 4.3 mmol/L (ref 3.5–5.1)
Sodium: 140 mmol/L (ref 135–145)
Total Bilirubin: 0.7 mg/dL (ref 0.3–1.2)
Total Protein: 6.9 g/dL (ref 6.5–8.1)

## 2018-10-30 LAB — CBC
HCT: 29.2 % — ABNORMAL LOW (ref 36.0–46.0)
Hemoglobin: 9.2 g/dL — ABNORMAL LOW (ref 12.0–15.0)
MCH: 28.2 pg (ref 26.0–34.0)
MCHC: 31.5 g/dL (ref 30.0–36.0)
MCV: 89.6 fL (ref 80.0–100.0)
Platelets: 137 10*3/uL — ABNORMAL LOW (ref 150–400)
RBC: 3.26 MIL/uL — ABNORMAL LOW (ref 3.87–5.11)
RDW: 13.8 % (ref 11.5–15.5)
WBC: 5.6 10*3/uL (ref 4.0–10.5)
nRBC: 0 % (ref 0.0–0.2)

## 2018-10-30 LAB — PROCALCITONIN: Procalcitonin: 18.82 ng/mL

## 2018-10-30 LAB — HEPATITIS PANEL, ACUTE
HCV Ab: <0.1 s/co ratio (ref 0.0–0.9)
Hep A IgM: NEGATIVE
Hep B C IgM: NEGATIVE
Hepatitis B Surface Ag: NEGATIVE

## 2018-10-30 MED ORDER — DOXYCYCLINE HYCLATE 50 MG PO CAPS: 100.0000 mg | ORAL_CAPSULE | Freq: Two times a day (BID) | ORAL | 0 refills | Status: AC

## 2018-10-30 NOTE — TOC Transition Note (Signed)
Transition of Care [facility]) - CM/SW Discharge Note   Patient Details  Name: Lynn Joseph MRN: [id number] Date of Birth: Feb 27, 1946  Transition of Care [facility]) CM/SW Contact:  Ross Ludwig, LCSW Phone Number: 10/30/2018, 5:04 PM   Clinical Narrative:     Patient discharging home with home health PT, RN, and aide.  CSW spoke to Judson Roch at [facility], she is aware that patient is discharging today.  Patient did not express any other concerns or issues.   Final next level of care: [place] Barriers to Discharge: No Barriers Identified   Patient Goals and CMS Choice Patient states their goals for this hospitalization and ongoing recovery are:: To return back home with home health. CMS Medicare.gov Compare Post Acute Care list provided to:: Patient Choice offered to / list presented to : Patient  Discharge Placement  Patient discharging back home with home health PT, RN, and aide.                     Discharge Plan and Services In-house Referral: Clinical Social Work   Post Acute Care Choice: Home Health          DME Arranged: N/A DME Agency: NA       HH Arranged: RN, PT, Nurse's Aide [place] Agency: [place] Date HH Agency Contacted: 10/30/18 Time [place]: 0915 Representative spoke with at [place]: [place] Determinants of Health ([place]) Interventions     Readmission Risk Interventions Readmission Risk Prevention Plan 10/29/2018  Medication Screening Complete  Transportation Screening Complete

## 2018-10-30 NOTE — Discharge Summary (Signed)
[facility] NAME: Lynn Joseph    MR#:  [id number]  DATE OF BIRTH:  08-Oct-1945  DATE OF ADMISSION:  10/28/2018 ADMITTING PHYSICIAN: Lang Snow, NP  DATE OF DISCHARGE: 10/30/2018   PRIMARY CARE PHYSICIAN: Velna Hatchet, MD    ADMISSION DIAGNOSIS:  Acute respiratory distress [R06.03] Abdominal pain, RUQ [R10.11] Community acquired pneumonia, unspecified laterality [J18.9]  DISCHARGE DIAGNOSIS:  Active Problems:   SOB (shortness of breath)   SECONDARY DIAGNOSIS:   Past Medical History:  Diagnosis Date  . CHF (congestive heart failure) ([facility])   . 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  1. Acuterespiratory failure with hypoxia- secondary to fluid overload and bronchitis. - Supplemental O2, goal sat 88-92% -DUONebs Q 6 hours for SOB and wheezing. -She improved and on room air now.  2.Acute DiastolicCongestive Heart Failure: Acute presentation likely due to volume overload due to infection  BNPmildlyelevated at 129 - Chest x-ray shows pulmonary vascular congestion Last Echo5/03/2008 , EF 65% - Afterload, Goal MAP <70:Hold Valsartan int he setting of AKI - Beta-Blockade: Metoprolol - Diuretics: Furosemide'20mg'$  IVBID. Diureses >1L negative per day until approach euvolemia / worsening renal function. - Echocardiogram done, EF 60% and no valvular abnormality. - Low salt diet - Check daily weight - Strict I&Os -We will stop Lasix now as renal function slightly worse.  3. Leukocytosis  - Procalcitonin  elevated 14.1 - UA negative - Urine and blood cultures negative. - Chest x-ray as above, will repeat following diuresis - Empiric abx Ceftriaxone and Azithromycin -Her COVID test was negative on admission.  We would do Respi panel. Nasal swab is sent and results are pending advised patient to follow as outpatient with primary care physician for diagnostic purpose though she is feeling completely back to normal now.  4. Elevated LFTs - US abdomen minimal cholelithiasis no evidence of cholecystitis -LFTs are coming down on further follow-up. -She does not have tenderness in right upper quadrant. -We will follow LFT again tomorrow but no further work-up needed. Her LFTs are coming down now still not completely back to normal. 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  Patient received ceftriaxone and azithromycin in the hospital and completely feeling back to normal and this is a treatment for leptospirosis along with also doxycycline oral 7 days to finish the course as outpatient. I informed patient and her daughter about this and advised them to follow the results of leptospirosis antibody which was sent before discharge from hospital with help of her primary care physician.  5.AKI- unknown baseline, patient has remote hx of CKD - Hold nephrotoxins -Continue to monitor-slight worse with Lasix use. -Monitor again tomorrow.  Encourage patient to drink some more liquid today. Her kidney function is slightly improving, advised not to take losartan until next 3 to 4 days and follow with primary care physician about renal function next  week  6.HTN- stable + Goal BP <140/90 - Beta-blocker: Metoprolol -  ACE-Inhibitor: Will resumeDiovan after 4 to 5 days with improving kidney functions  7. Hypomagnesia - IV mag x1 - Improved.  8. Hypothyroidism - Continue, TSH normal/.  9. DVT prophylaxis - Lovenox  10.GERD- Protonix   DISCHARGE CONDITIONS:   *Stable  CONSULTS OBTAINED:  Treatment Team:  Corey Skains, MD  DRUG ALLERGIES:  No Known Allergies  DISCHARGE MEDICATIONS:   Allergies as of 10/30/2018   No Known Allergies     Medication List    STOP taking these medications   valsartan 160 MG tablet Commonly known as: DIOVAN     TAKE these medications   Calcium 600+D High Potency 600-400 MG-UNIT tablet Generic drug: Calcium Carbonate-Vitamin D Take 1 tablet by mouth daily.   doxycycline 50 MG capsule Commonly known as: VIBRAMYCIN Take 2 capsules (100 mg total) by mouth 2 (two) times daily for 7 days.   metoprolol tartrate 100 MG tablet Commonly known as: LOPRESSOR Take 100 mg by mouth 2 (two) times a day.   multivitamin with minerals Tabs tablet Take 1 tablet by mouth daily.   Tirosint 125 MCG Caps Generic drug: Levothyroxine Sodium Take 125 mcg by mouth daily.   triamterene-hydrochlorothiazide 37.5-25 MG capsule Commonly known as: DYAZIDE Take 1 capsule by mouth daily.        DISCHARGE INSTRUCTIONS:    Follow renal function and liver function with primary care physician next week, follow leptospirosis antibody next week with primary care physician.  If you experience worsening of your admission symptoms, develop shortness of breath, life threatening emergency, suicidal or homicidal thoughts you must seek medical attention immediately by calling 911 or calling your MD immediately  if symptoms less severe.  You Must read complete instructions/literature along with all the possible adverse reactions/side effects for all the Medicines you take and that have been prescribed to you. Take any new Medicines after you have completely understood and  accept all the possible adverse reactions/side effects.   Please note  You were cared for by a hospitalist during your hospital stay. If you have any questions about your discharge medications or the care you received while you were in the hospital after you are discharged, you can call the unit and asked to speak with the hospitalist on call if the hospitalist that took care of you is not available. Once you are discharged, your primary care physician will handle any further medical issues. Please note that NO REFILLS for any discharge medications will be authorized once you are discharged, as it is imperative that you return to your primary care physician (or establish a relationship with a primary care physician if you do not have one) for your aftercare needs so that they can reassess your need for medications and monitor your lab values.    Today   CHIEF COMPLAINT:   Chief Complaint  Patient presents with  . Shortness of Breath    HISTORY OF PRESENT ILLNESS:  Lynn Joseph  is a 73 y.o. 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  Patient states that she woke up at 5 AM this morning with chills and fever, 1 hour later she developed coughing and wheezing.  Also endorses chest  tightness for which she took 2 baby aspirin due to concern for possible MI.  She states over the last 3 to 4 months she has gained approximately 20 pounds and noticed swelling in her legs.  Denies other associated symptoms of nausea or vomiting, dizziness, headache, diaphoresis, or diarrhea.  She states she has history of kidney disease and has been intermittent episodes of bilateral flank pain.  She decided to come to the ED today  due to worsening shortness of breath with chest tightness.  On arrival to the ED, She was febrile temp 102.2 with blood pressure 186/80 mm Hg and pulse rate 103 beats/min, Resp. 30. Oxygen saturation was in the upper 80s so she was placed on oxygen via nasal cannula. There were no focal neurological deficits; she was alert and oriented x 4 but with increased work of breathing.  Initial labs revealed BUN 35, creatinine 1.38, AST 187, ALT 85 alk phos 164, platelets 130, mag 1.3, BNP 126, UA trace leukocytes, ECG showed sinus rhythm of 51 beats per minute and the chest X-ray shows pulmonary vascular congestion with minimal bronchitic changes without acute infiltrate. Patient is being admitted under hospitalist service for further management.   VITAL SIGNS:  Blood pressure (!) 140/55, pulse 70, temperature 97.9 F (36.6 C), temperature source Oral, resp. rate 20, height '5\' 5"'$  (1.651 m), weight 120.2 kg, SpO2 97 %.  I/O:    Intake/Output Summary (Last 24 hours) at 10/30/2018 1644 Last data filed at 10/30/2018 0838 Gross per 24 hour  Intake -  Output 1600 ml  Net -1600 ml    PHYSICAL EXAMINATION:  GENERAL:  73 y.o.-year-old patient lying in the bed with no acute distress.  EYES: Pupils equal, round, reactive to light and accommodation. No scleral icterus. Extraocular muscles intact.  HEENT: Head atraumatic, normocephalic. Oropharynx and nasopharynx clear.  NECK:  Supple, no jugular venous distention. No thyroid enlargement, no tenderness.  LUNGS: Normal breath sounds bilaterally, no wheezing, rales,rhonchi or crepitation. No use of accessory muscles of respiration.  CARDIOVASCULAR: S1, S2 normal. No murmurs, rubs, or gallops.  ABDOMEN: Soft, non-tender, non-distended. Bowel sounds present. No organomegaly or mass.  EXTREMITIES: No pedal edema, cyanosis, or clubbing.  NEUROLOGIC: Cranial nerves II through XII are intact. Muscle strength 5/5 in all extremities. Sensation intact. Gait not checked.   PSYCHIATRIC: The patient is alert and oriented x 3.  SKIN: No obvious rash, lesion, or ulcer.   DATA REVIEW:   CBC Recent Labs  Lab 10/30/18 0617  WBC 5.6  HGB 9.2*  HCT 29.2*  PLT 137*    Chemistries  Recent Labs  Lab 10/29/18 0659  10/30/18 0617  NA 138  --  140  K 4.1  --  4.3  CL 110  --  106  CO2 20*  --  23  GLUCOSE 108*  --  105*  BUN 34*  --  33*  CREATININE 1.72*  --  1.67*  CALCIUM 8.2*  --  8.6*  MG 1.8  --   --   AST  --    < > 111*  ALT  --    < > 137*  ALKPHOS  --    < > 181*  BILITOT  --    < > 0.7   < > = values in this interval not displayed.    Cardiac Enzymes No results for input(s): TROPONINI in the last 168 hours.  Microbiology Results  Results for orders placed or performed during the hospital encounter of  10/28/18  SARS Coronavirus 2 (CEPHEID- Performed in [facility] lab), Hosp Order     Status: None   Collection Time: 10/28/18  7:20 AM   Specimen: Nasopharyngeal Swab  Result Value Ref Range Status   SARS Coronavirus 2 NEGATIVE NEGATIVE Final    Comment: (NOTE) If result is NEGATIVE SARS-CoV-2 target nucleic acids are NOT DETECTED. The SARS-CoV-2 RNA is generally detectable in upper and lower  respiratory specimens during the acute phase of infection. The lowest  concentration of SARS-CoV-2 viral copies this assay can detect is 250  copies / mL. A negative result does not preclude SARS-CoV-2 infection  and should not be used as the sole basis for treatment or other  patient management decisions.  A negative result may occur with  improper specimen collection / handling, submission of specimen other  than nasopharyngeal swab, presence of viral mutation(s) within the  areas targeted by this assay, and inadequate number of viral copies  (<250 copies / mL). A negative result must be combined with clinical  observations, patient history, and epidemiological information. If result is POSITIVE SARS-CoV-2 target nucleic acids are  DETECTED. The SARS-CoV-2 RNA is generally detectable in upper and lower  respiratory specimens dur ing the acute phase of infection.  Positive  results are indicative of active infection with SARS-CoV-2.  Clinical  correlation with patient history and other diagnostic information is  necessary to determine patient infection status.  Positive results do  not rule out bacterial infection or co-infection with other viruses. If result is PRESUMPTIVE POSTIVE SARS-CoV-2 nucleic acids MAY BE PRESENT.   A presumptive positive result was obtained on the submitted specimen  and confirmed on repeat testing.  While 2019 novel coronavirus  (SARS-CoV-2) nucleic acids may be present in the submitted sample  additional confirmatory testing may be necessary for epidemiological  and / or clinical management purposes  to differentiate between  SARS-CoV-2 and other Sarbecovirus currently known to infect humans.  If clinically indicated additional testing with an alternate test  methodology [phone]) is advised. The SARS-CoV-2 RNA is generally  detectable in upper and lower respiratory sp ecimens during the acute  phase of infection. The expected result is Negative. Fact Sheet for Patients:  StrictlyIdeas.no Fact Sheet for Healthcare Providers: BankingDealers.co.za This test is not yet approved or cleared by the [place] FDA and has been authorized for detection and/or diagnosis of SARS-CoV-2 by FDA under an Emergency Use Authorization (EUA).  This EUA will remain in effect (meaning this test can be used) for the duration of the COVID-19 declaration under Section 564(b)(1) of the Act, 21 U.S.C. section 360bbb-3(b)(1), unless the authorization is terminated or revoked sooner. Performed at [facility], [address] [place], [place] [address]   Blood culture (routine x 2)     Status: None (Preliminary result)   Collection Time: 10/28/18   7:21 AM   Specimen: BLOOD  Result Value Ref Range Status   Specimen Description BLOOD RIGHT ANTECUBITAL  Final   Special Requests   Final    BOTTLES DRAWN AEROBIC AND ANAEROBIC Blood Culture adequate volume   Culture   Final    NO GROWTH 2 DAYS Performed at [facility], [address] [place], [place] [address]    Report Status PENDING  Incomplete  Blood culture (routine x 2)     Status: None (Preliminary result)   Collection Time: 10/28/18  7:21 AM   Specimen: BLOOD  Result Value Ref Range Status   Specimen  Description BLOOD RIGHT ARM  Final   Special Requests   Final    BOTTLES DRAWN AEROBIC AND ANAEROBIC Blood Culture results may not be optimal due to an inadequate volume of blood received in culture bottles   Culture   Final    NO GROWTH 2 DAYS Performed at [facility], [address], [place], [place] [address]    Report Status PENDING  Incomplete  Urine culture     Status: None   Collection Time: 10/28/18  9:24 AM   Specimen: Urine, Random  Result Value Ref Range Status   Specimen Description   Final    URINE, RANDOM Performed at [facility], [address], [place], [place] [address]    Special Requests   Final    NONE Performed at [facility], [address], [place], [place] [address]    Culture   Final    Multiple bacterial morphotypes present, none predominant. Suggest appropriate recollection if clinically indicated.   Report Status 10/29/2018 FINAL  Final    RADIOLOGY:  Dg Chest 2 View  Result Date: 10/30/2018 CLINICAL DATA:  Shortness of breath and lower extremity edema EXAM: CHEST - 2 VIEW COMPARISON:  October 28, 2018 FINDINGS: There is minimal bibasilar atelectasis. There is no edema or consolidation. Heart size and pulmonary vascularity are normal. No adenopathy. No bone lesions. IMPRESSION: Minimal bibasilar atelectasis. No edema or consolidation. Cardiac silhouette within normal limits. Electronically Signed   By:  Lowella Grip III M.D.   On: 10/30/2018 07:53    EKG:   Orders placed or performed during the hospital encounter of 10/28/18  . ED EKG  . ED EKG      Management plans discussed with the patient, family and they are in agreement.  CODE STATUS:     Code Status Orders  (From admission, onward)         Start     Ordered   10/28/18 1048  Full code  Continuous     10/28/18 1056        Code Status History    This patient has a current code status but no historical code status.   Advance Care Planning Activity      TOTAL TIME TAKING CARE OF THIS PATIENT: 35 minutes.    Vaughan Basta M.D on 10/30/2018 at 4:44 PM  Between 7am to 6pm - Pager - [phone]  After 6pm go to [email or web address] - password [email or web address] [facility]  Office  [phone]  CC: Primary care physician; Velna Hatchet, MD   Note: This dictation was prepared with Dragon dictation along with smaller phrase technology. Any transcriptional errors that result from this process are unintentional.

## 2018-10-30 NOTE — Progress Notes (Signed)
Infectious disease called me and asked why the patient is re-swab for covid, what I got from report was they did that because of her symptoms when she got her. Patient is asymptomatic now and also is being discharge. Per infectious disease once patient is discharge we need to wait for 70 minutes until it needs to be clean, I will let charge RN aware.

## 2018-10-30 NOTE — Progress Notes (Signed)
Went over discharge instructions with the patient including medications and follow-up appointment. Lab is in the room now for patient to be check for Laptospira antibody test before sending her home per Dr. Anselm Jungling. Discontinue peripheral IV and telemetry monitor. Also waiting for patient's daughter for her ride.

## 2018-10-31 LAB — NOVEL CORONAVIRUS, NAA (HOSP ORDER, SEND-OUT TO REF LAB; TAT 18-24 HRS): SARS-CoV-2, NAA: NOT DETECTED

## 2018-11-01 DIAGNOSIS — E039 Hypothyroidism, unspecified: Secondary | ICD-10-CM | POA: Diagnosis not present

## 2018-11-01 DIAGNOSIS — J9601 Acute respiratory failure with hypoxia: Secondary | ICD-10-CM | POA: Diagnosis not present

## 2018-11-01 DIAGNOSIS — N189 Chronic kidney disease, unspecified: Secondary | ICD-10-CM | POA: Diagnosis not present

## 2018-11-01 DIAGNOSIS — I5033 Acute on chronic diastolic (congestive) heart failure: Secondary | ICD-10-CM | POA: Diagnosis not present

## 2018-11-01 DIAGNOSIS — J189 Pneumonia, unspecified organism: Secondary | ICD-10-CM | POA: Diagnosis not present

## 2018-11-01 DIAGNOSIS — K219 Gastro-esophageal reflux disease without esophagitis: Secondary | ICD-10-CM | POA: Diagnosis not present

## 2018-11-01 DIAGNOSIS — I130 Hypertensive heart and chronic kidney disease with heart failure and stage 1 through stage 4 chronic kidney disease, or unspecified chronic kidney disease: Secondary | ICD-10-CM | POA: Diagnosis not present

## 2018-11-02 LAB — CULTURE, BLOOD (ROUTINE X 2)
Culture: NO GROWTH
Culture: NO GROWTH
Special Requests: ADEQUATE

## 2018-11-03 DIAGNOSIS — R7989 Other specified abnormal findings of blood chemistry: Secondary | ICD-10-CM | POA: Diagnosis not present

## 2018-11-03 DIAGNOSIS — D72829 Elevated white blood cell count, unspecified: Secondary | ICD-10-CM | POA: Diagnosis not present

## 2018-11-03 DIAGNOSIS — J9601 Acute respiratory failure with hypoxia: Secondary | ICD-10-CM | POA: Diagnosis not present

## 2018-11-03 DIAGNOSIS — N183 Chronic kidney disease, stage 3 (moderate): Secondary | ICD-10-CM | POA: Diagnosis not present

## 2018-11-03 DIAGNOSIS — N179 Acute kidney failure, unspecified: Secondary | ICD-10-CM | POA: Diagnosis not present

## 2018-11-03 DIAGNOSIS — E611 Iron deficiency: Secondary | ICD-10-CM | POA: Diagnosis not present

## 2018-11-03 DIAGNOSIS — I130 Hypertensive heart and chronic kidney disease with heart failure and stage 1 through stage 4 chronic kidney disease, or unspecified chronic kidney disease: Secondary | ICD-10-CM | POA: Diagnosis not present

## 2018-11-03 DIAGNOSIS — I5031 Acute diastolic (congestive) heart failure: Secondary | ICD-10-CM | POA: Diagnosis not present

## 2018-11-04 DIAGNOSIS — J189 Pneumonia, unspecified organism: Secondary | ICD-10-CM | POA: Diagnosis not present

## 2018-11-04 DIAGNOSIS — E039 Hypothyroidism, unspecified: Secondary | ICD-10-CM | POA: Diagnosis not present

## 2018-11-04 DIAGNOSIS — I130 Hypertensive heart and chronic kidney disease with heart failure and stage 1 through stage 4 chronic kidney disease, or unspecified chronic kidney disease: Secondary | ICD-10-CM | POA: Diagnosis not present

## 2018-11-04 DIAGNOSIS — J9601 Acute respiratory failure with hypoxia: Secondary | ICD-10-CM | POA: Diagnosis not present

## 2018-11-04 DIAGNOSIS — I5033 Acute on chronic diastolic (congestive) heart failure: Secondary | ICD-10-CM | POA: Diagnosis not present

## 2018-11-04 DIAGNOSIS — N189 Chronic kidney disease, unspecified: Secondary | ICD-10-CM | POA: Diagnosis not present

## 2018-11-05 DIAGNOSIS — J189 Pneumonia, unspecified organism: Secondary | ICD-10-CM | POA: Diagnosis not present

## 2018-11-05 DIAGNOSIS — I5033 Acute on chronic diastolic (congestive) heart failure: Secondary | ICD-10-CM | POA: Diagnosis not present

## 2018-11-05 DIAGNOSIS — N189 Chronic kidney disease, unspecified: Secondary | ICD-10-CM | POA: Diagnosis not present

## 2018-11-05 DIAGNOSIS — I130 Hypertensive heart and chronic kidney disease with heart failure and stage 1 through stage 4 chronic kidney disease, or unspecified chronic kidney disease: Secondary | ICD-10-CM | POA: Diagnosis not present

## 2018-11-05 DIAGNOSIS — E039 Hypothyroidism, unspecified: Secondary | ICD-10-CM | POA: Diagnosis not present

## 2018-11-05 DIAGNOSIS — J9601 Acute respiratory failure with hypoxia: Secondary | ICD-10-CM | POA: Diagnosis not present

## 2018-11-05 LAB — MISC LABCORP TEST (SEND OUT): Labcorp test code: 830035

## 2018-11-07 DIAGNOSIS — J9601 Acute respiratory failure with hypoxia: Secondary | ICD-10-CM | POA: Diagnosis not present

## 2018-11-07 DIAGNOSIS — J189 Pneumonia, unspecified organism: Secondary | ICD-10-CM | POA: Diagnosis not present

## 2018-11-07 DIAGNOSIS — I130 Hypertensive heart and chronic kidney disease with heart failure and stage 1 through stage 4 chronic kidney disease, or unspecified chronic kidney disease: Secondary | ICD-10-CM | POA: Diagnosis not present

## 2018-11-07 DIAGNOSIS — E039 Hypothyroidism, unspecified: Secondary | ICD-10-CM | POA: Diagnosis not present

## 2018-11-07 DIAGNOSIS — N189 Chronic kidney disease, unspecified: Secondary | ICD-10-CM | POA: Diagnosis not present

## 2018-11-07 DIAGNOSIS — I5033 Acute on chronic diastolic (congestive) heart failure: Secondary | ICD-10-CM | POA: Diagnosis not present

## 2018-11-11 DIAGNOSIS — I130 Hypertensive heart and chronic kidney disease with heart failure and stage 1 through stage 4 chronic kidney disease, or unspecified chronic kidney disease: Secondary | ICD-10-CM | POA: Diagnosis not present

## 2018-11-11 DIAGNOSIS — I5033 Acute on chronic diastolic (congestive) heart failure: Secondary | ICD-10-CM | POA: Diagnosis not present

## 2018-11-11 DIAGNOSIS — E039 Hypothyroidism, unspecified: Secondary | ICD-10-CM | POA: Diagnosis not present

## 2018-11-11 DIAGNOSIS — N189 Chronic kidney disease, unspecified: Secondary | ICD-10-CM | POA: Diagnosis not present

## 2018-11-11 DIAGNOSIS — J9601 Acute respiratory failure with hypoxia: Secondary | ICD-10-CM | POA: Diagnosis not present

## 2018-11-11 DIAGNOSIS — J189 Pneumonia, unspecified organism: Secondary | ICD-10-CM | POA: Diagnosis not present

## 2018-11-13 DIAGNOSIS — J189 Pneumonia, unspecified organism: Secondary | ICD-10-CM | POA: Diagnosis not present

## 2018-11-13 DIAGNOSIS — E039 Hypothyroidism, unspecified: Secondary | ICD-10-CM | POA: Diagnosis not present

## 2018-11-13 DIAGNOSIS — N189 Chronic kidney disease, unspecified: Secondary | ICD-10-CM | POA: Diagnosis not present

## 2018-11-13 DIAGNOSIS — J9601 Acute respiratory failure with hypoxia: Secondary | ICD-10-CM | POA: Diagnosis not present

## 2018-11-13 DIAGNOSIS — I130 Hypertensive heart and chronic kidney disease with heart failure and stage 1 through stage 4 chronic kidney disease, or unspecified chronic kidney disease: Secondary | ICD-10-CM | POA: Diagnosis not present

## 2018-11-13 DIAGNOSIS — I5033 Acute on chronic diastolic (congestive) heart failure: Secondary | ICD-10-CM | POA: Diagnosis not present

## 2018-11-14 DIAGNOSIS — I5033 Acute on chronic diastolic (congestive) heart failure: Secondary | ICD-10-CM | POA: Diagnosis not present

## 2018-11-14 DIAGNOSIS — J189 Pneumonia, unspecified organism: Secondary | ICD-10-CM | POA: Diagnosis not present

## 2018-11-14 DIAGNOSIS — N189 Chronic kidney disease, unspecified: Secondary | ICD-10-CM | POA: Diagnosis not present

## 2018-11-14 DIAGNOSIS — E039 Hypothyroidism, unspecified: Secondary | ICD-10-CM | POA: Diagnosis not present

## 2018-11-14 DIAGNOSIS — J9601 Acute respiratory failure with hypoxia: Secondary | ICD-10-CM | POA: Diagnosis not present

## 2018-11-14 DIAGNOSIS — I130 Hypertensive heart and chronic kidney disease with heart failure and stage 1 through stage 4 chronic kidney disease, or unspecified chronic kidney disease: Secondary | ICD-10-CM | POA: Diagnosis not present

## 2018-11-17 DIAGNOSIS — E039 Hypothyroidism, unspecified: Secondary | ICD-10-CM | POA: Diagnosis not present

## 2018-11-17 DIAGNOSIS — I130 Hypertensive heart and chronic kidney disease with heart failure and stage 1 through stage 4 chronic kidney disease, or unspecified chronic kidney disease: Secondary | ICD-10-CM | POA: Diagnosis not present

## 2018-11-17 DIAGNOSIS — J9601 Acute respiratory failure with hypoxia: Secondary | ICD-10-CM | POA: Diagnosis not present

## 2018-11-17 DIAGNOSIS — I5033 Acute on chronic diastolic (congestive) heart failure: Secondary | ICD-10-CM | POA: Diagnosis not present

## 2018-11-17 DIAGNOSIS — J189 Pneumonia, unspecified organism: Secondary | ICD-10-CM | POA: Diagnosis not present

## 2018-11-17 DIAGNOSIS — N189 Chronic kidney disease, unspecified: Secondary | ICD-10-CM | POA: Diagnosis not present

## 2018-11-19 DIAGNOSIS — E039 Hypothyroidism, unspecified: Secondary | ICD-10-CM | POA: Diagnosis not present

## 2018-11-19 DIAGNOSIS — I130 Hypertensive heart and chronic kidney disease with heart failure and stage 1 through stage 4 chronic kidney disease, or unspecified chronic kidney disease: Secondary | ICD-10-CM | POA: Diagnosis not present

## 2018-11-19 DIAGNOSIS — N189 Chronic kidney disease, unspecified: Secondary | ICD-10-CM | POA: Diagnosis not present

## 2018-11-19 DIAGNOSIS — J189 Pneumonia, unspecified organism: Secondary | ICD-10-CM | POA: Diagnosis not present

## 2018-11-19 DIAGNOSIS — J9601 Acute respiratory failure with hypoxia: Secondary | ICD-10-CM | POA: Diagnosis not present

## 2018-11-19 DIAGNOSIS — I5033 Acute on chronic diastolic (congestive) heart failure: Secondary | ICD-10-CM | POA: Diagnosis not present

## 2018-11-20 DIAGNOSIS — J9601 Acute respiratory failure with hypoxia: Secondary | ICD-10-CM | POA: Diagnosis not present

## 2018-11-20 DIAGNOSIS — J189 Pneumonia, unspecified organism: Secondary | ICD-10-CM | POA: Diagnosis not present

## 2018-11-20 DIAGNOSIS — I5033 Acute on chronic diastolic (congestive) heart failure: Secondary | ICD-10-CM | POA: Diagnosis not present

## 2018-11-20 DIAGNOSIS — I130 Hypertensive heart and chronic kidney disease with heart failure and stage 1 through stage 4 chronic kidney disease, or unspecified chronic kidney disease: Secondary | ICD-10-CM | POA: Diagnosis not present

## 2018-11-20 DIAGNOSIS — N189 Chronic kidney disease, unspecified: Secondary | ICD-10-CM | POA: Diagnosis not present

## 2018-11-20 DIAGNOSIS — E039 Hypothyroidism, unspecified: Secondary | ICD-10-CM | POA: Diagnosis not present

## 2018-11-26 DIAGNOSIS — R7989 Other specified abnormal findings of blood chemistry: Secondary | ICD-10-CM | POA: Diagnosis not present

## 2018-11-27 DIAGNOSIS — I5033 Acute on chronic diastolic (congestive) heart failure: Secondary | ICD-10-CM | POA: Diagnosis not present

## 2018-11-27 DIAGNOSIS — E039 Hypothyroidism, unspecified: Secondary | ICD-10-CM | POA: Diagnosis not present

## 2018-11-27 DIAGNOSIS — I130 Hypertensive heart and chronic kidney disease with heart failure and stage 1 through stage 4 chronic kidney disease, or unspecified chronic kidney disease: Secondary | ICD-10-CM | POA: Diagnosis not present

## 2018-11-27 DIAGNOSIS — N189 Chronic kidney disease, unspecified: Secondary | ICD-10-CM | POA: Diagnosis not present

## 2018-11-27 DIAGNOSIS — J189 Pneumonia, unspecified organism: Secondary | ICD-10-CM | POA: Diagnosis not present

## 2018-11-27 DIAGNOSIS — J9601 Acute respiratory failure with hypoxia: Secondary | ICD-10-CM | POA: Diagnosis not present

## 2018-12-01 DIAGNOSIS — E039 Hypothyroidism, unspecified: Secondary | ICD-10-CM | POA: Diagnosis not present

## 2018-12-01 DIAGNOSIS — J189 Pneumonia, unspecified organism: Secondary | ICD-10-CM | POA: Diagnosis not present

## 2018-12-01 DIAGNOSIS — I5033 Acute on chronic diastolic (congestive) heart failure: Secondary | ICD-10-CM | POA: Diagnosis not present

## 2018-12-01 DIAGNOSIS — K219 Gastro-esophageal reflux disease without esophagitis: Secondary | ICD-10-CM | POA: Diagnosis not present

## 2018-12-01 DIAGNOSIS — I130 Hypertensive heart and chronic kidney disease with heart failure and stage 1 through stage 4 chronic kidney disease, or unspecified chronic kidney disease: Secondary | ICD-10-CM | POA: Diagnosis not present

## 2018-12-01 DIAGNOSIS — N189 Chronic kidney disease, unspecified: Secondary | ICD-10-CM | POA: Diagnosis not present

## 2018-12-01 DIAGNOSIS — J9601 Acute respiratory failure with hypoxia: Secondary | ICD-10-CM | POA: Diagnosis not present

## 2018-12-05 DIAGNOSIS — I5033 Acute on chronic diastolic (congestive) heart failure: Secondary | ICD-10-CM | POA: Diagnosis not present

## 2018-12-05 DIAGNOSIS — E039 Hypothyroidism, unspecified: Secondary | ICD-10-CM | POA: Diagnosis not present

## 2018-12-05 DIAGNOSIS — I130 Hypertensive heart and chronic kidney disease with heart failure and stage 1 through stage 4 chronic kidney disease, or unspecified chronic kidney disease: Secondary | ICD-10-CM | POA: Diagnosis not present

## 2018-12-05 DIAGNOSIS — N189 Chronic kidney disease, unspecified: Secondary | ICD-10-CM | POA: Diagnosis not present

## 2018-12-05 DIAGNOSIS — J9601 Acute respiratory failure with hypoxia: Secondary | ICD-10-CM | POA: Diagnosis not present

## 2018-12-05 DIAGNOSIS — J189 Pneumonia, unspecified organism: Secondary | ICD-10-CM | POA: Diagnosis not present

## 2018-12-11 DIAGNOSIS — I130 Hypertensive heart and chronic kidney disease with heart failure and stage 1 through stage 4 chronic kidney disease, or unspecified chronic kidney disease: Secondary | ICD-10-CM | POA: Diagnosis not present

## 2018-12-11 DIAGNOSIS — R1011 Right upper quadrant pain: Secondary | ICD-10-CM | POA: Diagnosis not present

## 2018-12-11 DIAGNOSIS — N183 Chronic kidney disease, stage 3 (moderate): Secondary | ICD-10-CM | POA: Diagnosis not present

## 2019-01-14 ENCOUNTER — Other Ambulatory Visit: Payer: Self-pay | Admitting: Internal Medicine

## 2019-01-14 DIAGNOSIS — Z1231 Encounter for screening mammogram for malignant neoplasm of breast: Secondary | ICD-10-CM

## 2019-02-19 ENCOUNTER — Ambulatory Visit
Admission: RE | Admit: 2019-02-19 | Discharge: 2019-02-19 | Disposition: A | Discharge status: Home / Self Care | Payer: Medicare Other | Source: Ambulatory Visit | Attending: Internal Medicine | Admitting: Internal Medicine

## 2019-02-19 ENCOUNTER — Other Ambulatory Visit: Payer: Self-pay

## 2019-02-19 DIAGNOSIS — Z1231 Encounter for screening mammogram for malignant neoplasm of breast: Secondary | ICD-10-CM

## 2019-02-24 ENCOUNTER — Ambulatory Visit: Payer: Medicare Other

## 2019-02-27 DIAGNOSIS — Z23 Encounter for immunization: Secondary | ICD-10-CM | POA: Diagnosis not present

## 2019-02-27 DIAGNOSIS — E538 Deficiency of other specified B group vitamins: Secondary | ICD-10-CM | POA: Diagnosis not present

## 2019-02-27 DIAGNOSIS — M859 Disorder of bone density and structure, unspecified: Secondary | ICD-10-CM | POA: Diagnosis not present

## 2019-02-27 DIAGNOSIS — E038 Other specified hypothyroidism: Secondary | ICD-10-CM | POA: Diagnosis not present

## 2019-02-27 DIAGNOSIS — I10 Essential (primary) hypertension: Secondary | ICD-10-CM | POA: Diagnosis not present

## 2019-05-15 ENCOUNTER — Ambulatory Visit: Payer: Medicare Other | Attending: Internal Medicine

## 2019-05-15 DIAGNOSIS — Z23 Encounter for immunization: Secondary | ICD-10-CM | POA: Insufficient documentation

## 2019-05-15 NOTE — Progress Notes (Signed)
   Covid-19 Vaccination Clinic  Name:  MAIRIN BROWER    MRN: [id number] DOB: [birth year]  05/15/2019  Ms. Ovalle was observed post Covid-19 immunization for 15 minutes without incidence. She was provided with Vaccine Information Sheet and instruction to access the V-Safe system.   Ms. Gullikson was instructed to call 911 with any severe reactions post vaccine: Marland Kitchen Difficulty breathing  . Swelling of your face and throat  . A fast heartbeat  . A bad rash all over your body  . Dizziness and weakness    Immunizations Administered    Name Date Dose VIS Date Route   Pfizer COVID-19 Vaccine 05/15/2019  4:11 PM 0.3 mL 04/03/2019 Intramuscular   Manufacturer: [place]   Lot: [id number]   [place]: [id number]

## 2019-05-20 DIAGNOSIS — I5030 Unspecified diastolic (congestive) heart failure: Secondary | ICD-10-CM | POA: Diagnosis not present

## 2019-05-20 DIAGNOSIS — I130 Hypertensive heart and chronic kidney disease with heart failure and stage 1 through stage 4 chronic kidney disease, or unspecified chronic kidney disease: Secondary | ICD-10-CM | POA: Diagnosis not present

## 2019-05-20 DIAGNOSIS — N1832 Chronic kidney disease, stage 3b: Secondary | ICD-10-CM | POA: Diagnosis not present

## 2019-05-20 DIAGNOSIS — E669 Obesity, unspecified: Secondary | ICD-10-CM | POA: Diagnosis not present

## 2019-06-05 ENCOUNTER — Ambulatory Visit: Payer: Medicare Other | Attending: Internal Medicine

## 2019-06-05 DIAGNOSIS — Z23 Encounter for immunization: Secondary | ICD-10-CM | POA: Insufficient documentation

## 2019-06-05 NOTE — Progress Notes (Signed)
   Covid-19 Vaccination Clinic  Name:  Lynn Joseph    MRN: [id number] DOB: 1945/12/23  06/05/2019  [name] was observed post Covid-19 immunization for 15 minutes without incidence. She was provided with Vaccine Information Sheet and instruction to access the V-Safe system.   [name] was instructed to call 911 with any severe reactions post vaccine: Marland Kitchen Difficulty breathing  . Swelling of your face and throat  . A fast heartbeat  . A bad rash all over your body  . Dizziness and weakness    Immunizations Administered    Name Date Dose VIS Date Route   Pfizer COVID-19 Vaccine 06/05/2019  1:32 PM 0.3 mL 04/03/2019 Intramuscular   Manufacturer: [place]   Lot: [id number]   [place]: [id number]

## 2019-09-08 DIAGNOSIS — I129 Hypertensive chronic kidney disease with stage 1 through stage 4 chronic kidney disease, or unspecified chronic kidney disease: Secondary | ICD-10-CM | POA: Diagnosis not present

## 2019-09-08 DIAGNOSIS — N1832 Chronic kidney disease, stage 3b: Secondary | ICD-10-CM | POA: Diagnosis not present

## 2019-09-08 DIAGNOSIS — M25562 Pain in left knee: Secondary | ICD-10-CM | POA: Diagnosis not present

## 2019-09-23 ENCOUNTER — Other Ambulatory Visit: Payer: Self-pay

## 2019-09-23 ENCOUNTER — Encounter: Payer: Self-pay | Admitting: Orthopaedic Surgery

## 2019-09-23 ENCOUNTER — Ambulatory Visit: Payer: Self-pay

## 2019-09-23 ENCOUNTER — Ambulatory Visit (INDEPENDENT_AMBULATORY_CARE_PROVIDER_SITE_OTHER): Payer: Medicare Other | Admitting: Physician Assistant

## 2019-09-23 ENCOUNTER — Ambulatory Visit (INDEPENDENT_AMBULATORY_CARE_PROVIDER_SITE_OTHER): Payer: Medicare Other

## 2019-09-23 DIAGNOSIS — M170 Bilateral primary osteoarthritis of knee: Secondary | ICD-10-CM

## 2019-09-23 DIAGNOSIS — M1711 Unilateral primary osteoarthritis, right knee: Secondary | ICD-10-CM | POA: Insufficient documentation

## 2019-09-23 DIAGNOSIS — M1712 Unilateral primary osteoarthritis, left knee: Secondary | ICD-10-CM | POA: Diagnosis not present

## 2019-09-23 MED ORDER — LIDOCAINE HCL 1 % IJ SOLN
0.5000 mL | INTRAMUSCULAR | Status: AC | PRN
  Administered 2019-09-23: .5 mL

## 2019-09-23 MED ORDER — METHYLPREDNISOLONE ACETATE 40 MG/ML IJ SUSP
40.0000 mg | INTRAMUSCULAR | Status: AC | PRN
  Administered 2019-09-23: 40 mg via INTRA_ARTICULAR

## 2019-09-23 NOTE — Progress Notes (Signed)
Office Visit Note   Patient: Lynn Joseph           Date of Birth: 1946/01/12           MRN: [id number] Visit Date: 09/23/2019              Requested by: Velna Hatchet, MD [address] [place],  [place] [address] PCP: Velna Hatchet, MD   Assessment & Plan: Visit Diagnoses:  1. Primary osteoarthritis of right knee   2. Primary osteoarthritis of left knee     Plan: Showed her some quad strengthening exercises discussed knee friendly exercises with her.  She is unable to take NSAIDs therefore recommended bilateral knee injections which she was agreeable with.  Also discussed with her supplemental injections which she may be candidate for in the future.  Like to see her back 2 weeks see what type of response she had to the injections.  Questions encouraged and answered at length.  Follow-Up Instructions: Return in about 2 weeks (around 10/07/2019).   Orders:  Orders Placed This Encounter  Procedures  . Large Joint Inj  . XR Knee 1-2 Views Left  . XR Knee 1-2 Views Right   No orders of the defined types were placed in this encounter.     Procedures: Large Joint Inj: bilateral knee on 09/23/2019 10:03 AM Indications: pain Details: 22 G 1.5 in needle, anterolateral approach  Arthrogram: No  Medications (Right): 0.5 mL lidocaine 1 %; 40 mg methylPREDNISolone acetate 40 MG/ML Medications (Left): 0.5 mL lidocaine 1 %; 40 mg methylPREDNISolone acetate 40 MG/ML Outcome: tolerated well, no immediate complications Procedure, treatment alternatives, risks and benefits explained, specific risks discussed. Consent was given by the patient. Immediately prior to procedure a time out was called to verify the correct patient, procedure, equipment, support staff and site/side marked as required. Patient was prepped and draped in the usual sterile fashion.       Clinical Data: No additional findings.   Subjective: Chief Complaint  Patient presents with  . Left Knee - Pain  .  Right Knee - Pain    HPI Lynn Joseph 74 year old female were seen for the first time.  She comes in today due to bilateral knee pain.  Initially she had pain after stepping down off a curb and somewhat losing her footing feels she tore something in her right calf but this pain is resolved.  She now having pain in both knees.  She describes the pain as a sharp pain to the tooth ache-like pain.  She has had trouble going up and down stairs times for years particularly on the left side.  History of left knee pain in the past and she is done some taping and icing which has helped.  Right knee with more acute pain since the stepping off the curb 3 to 4 weeks ago.  She does take Tylenol.  She is nondiabetic.  She has not had a Covid vaccine.  She has been told not to take NSAIDs due to her kidney function. Review of Systems See HPI otherwise negative or noncontributory.  Objective: Vital Signs: There were no vitals taken for this visit.  Physical Exam Constitutional:      Appearance: She is not ill-appearing or diaphoretic.  Pulmonary:     Effort: Pulmonary effort is normal.  Neurological:     Mental Status: She is alert and oriented to person, place, and time.  Psychiatric:        Mood and Affect: Mood  normal.     Ortho Exam Bilateral knees good range of motion.  Left knee significant crepitus with passive range of motion.  No abnormal warmth erythema or effusion of either knee.  No instability valgus varus stressing of either knee.  Slight tenderness along medial joint line of left knee only. Specialty Comments:  No specialty comments available.  Imaging: XR Knee 1-2 Views Left  Result Date: 09/23/2019 Left knee 2 views: Bone-on-bone medial compartment.  Moderate to severe patellofemoral arthritic changes with osteophytes.  Lateral joint lines well-maintained.  Knee is well located.  XR Knee 1-2 Views Right  Result Date: 09/23/2019 Right knee 2 views: No acute fracture.  Medial joint  line narrowing mild.  Moderate patellofemoral changes.  Lateral joint line is well-preserved.  No bony abnormalities.  Is well located.    PMFS History: Patient Active Problem List   Diagnosis Date Noted  . Primary osteoarthritis of left knee 09/23/2019  . Primary osteoarthritis of right knee 09/23/2019  . SOB (shortness of breath) 10/28/2018  . Dyspnea 02/15/2016  . OSA (obstructive sleep apnea) 03/07/2015  . 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 Snoring 01/31/2015  . Sleep apnea 01/31/2015  . Hypoxemia 01/31/2015  . Obesity 01/31/2015  . Ataxia 01/31/2015   Past Medical History:  Diagnosis Date  . Anemia of chronic disease   . CHF (congestive heart failure) ([place])   . Chronic diarrhea   . Glaucoma   . HTN (hypertension)   . Hypertension   . Hypothyroid   . Obesity   . Skin cancer     Family History  Problem Relation Age of Onset  . Hypertension Mother   . Thyroid disease Mother   . Stroke Mother   . Kidney disease Father   . Diabetes Father     Past Surgical History:  Procedure Laterality Date  . GASTRIC BYPASS    . GASTRIC RESTRICTION SURGERY    . TUBAL LIGATION     Social History   Occupational History  . Not on file  Tobacco Use  . Smoking status: Former Research scientist (life sciences)  . Smokeless tobacco: Never Used  Substance and Sexual Activity  . Alcohol use: Never    Comment: 1 glass of wine weekly  . Drug use: No  . Sexual activity: Not on file

## 2019-10-12 ENCOUNTER — Ambulatory Visit: Payer: Medicare Other | Admitting: Orthopaedic Surgery

## 2019-11-05 DIAGNOSIS — R030 Elevated blood-pressure reading, without diagnosis of hypertension: Secondary | ICD-10-CM | POA: Diagnosis not present

## 2019-12-21 ENCOUNTER — Ambulatory Visit (INDEPENDENT_AMBULATORY_CARE_PROVIDER_SITE_OTHER): Payer: Medicare Other | Admitting: Orthopaedic Surgery

## 2019-12-21 ENCOUNTER — Ambulatory Visit (INDEPENDENT_AMBULATORY_CARE_PROVIDER_SITE_OTHER): Payer: Medicare Other

## 2019-12-21 ENCOUNTER — Encounter: Payer: Self-pay | Admitting: Orthopaedic Surgery

## 2019-12-21 DIAGNOSIS — M545 Low back pain, unspecified: Secondary | ICD-10-CM

## 2019-12-21 MED ORDER — METHOCARBAMOL 500 MG PO TABS: 500.0000 mg | ORAL_TABLET | Freq: Four times a day (QID) | ORAL | 1 refills | Status: DC | PRN

## 2019-12-21 MED ORDER — METHYLPREDNISOLONE 4 MG PO TABS: ORAL_TABLET | ORAL | 0 refills | Status: DC

## 2019-12-21 NOTE — Progress Notes (Signed)
Office Visit Note   Patient: Lynn Joseph           Date of Birth: 03-08-1946           MRN: [id number] Visit Date: 12/21/2019              Requested by: Velna Hatchet, MD [id number] [place],  [place] [address] PCP: Velna Hatchet, MD   Assessment & Plan: Visit Diagnoses:  1. Low back pain, unspecified back pain laterality, unspecified chronicity, unspecified whether sciatica present     Plan: I am willing to try a steroid taper and Robaxin combined with outpatient physical therapy for her back.  She agrees with this treatment plan.  I went over x-rays with her and explained the rationale behind treating her back pain this way.  We will see her back in 4 weeks to see how she is doing overall.  All questions and concerns were answered and addressed.  Follow-Up Instructions: Return in about 4 weeks (around 01/18/2020).   Orders:  Orders Placed This Encounter  Procedures  . XR Lumbar Spine 2-3 Views   Meds ordered this encounter  Medications  . methylPREDNISolone (MEDROL) 4 MG tablet    Sig: Medrol dose pack. Take as instructed    Dispense:  21 tablet    Refill:  0  . methocarbamol (ROBAXIN) 500 MG tablet    Sig: Take 1 tablet (500 mg total) by mouth every 6 (six) hours as needed for muscle spasms.    Dispense:  60 tablet    Refill:  1      Procedures: No procedures performed   Clinical Data: No additional findings.   Subjective: Chief Complaint  Patient presents with  . Right Knee - Follow-up  . Left Knee - Follow-up  The chief complaint above status of the patient is here for follow-up for her knees.  She is also this is new patient but she is now a new patient.  Her chief complaint today is left-sided low back pain it does radiate into the sciatic region and down her left leg.  She is not had any back issues for many years and had an MRI remotely years ago for some facet problems.  She did strain her back in June this year trying to lift something too  heavy.  It has been a constant ache.  She has kidney disease so she only takes Tylenol.  She denies any change in bowel bladder function or other acute change in her medical status.  HPI  Review of Systems She currently denies a headache, chest pain, shortness of breath, fever, chills, nausea, vomiting  Objective: Vital Signs: There were no vitals taken for this visit.  Physical Exam She is alert and orient x3 and in no acute distress Ortho Exam Examination of her lumbar spine shows pain in the facet joint areas and radiating to the left side at the mid to lower lumbar spine.  She does have pain on stretch of the sciatic nerve.  She has good strength in her legs. Specialty Comments:  No specialty comments available.  Imaging: XR Lumbar Spine 2-3 Views  Result Date: 12/21/2019 2 views of the lumbar spine showed no acute findings.  There is no significant malalignment.    PMFS History: Patient Active Problem List   Diagnosis Date Noted  . Primary osteoarthritis of left knee 09/23/2019  . Primary osteoarthritis of right knee 09/23/2019  . SOB (shortness of breath) 10/28/2018  . Dyspnea 02/15/2016  .  OSA (obstructive sleep apnea) 03/07/2015  . 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 Snoring 01/31/2015  . Sleep apnea 01/31/2015  . Hypoxemia 01/31/2015  . Obesity 01/31/2015  . Ataxia 01/31/2015   Past Medical History:  Diagnosis Date  . Anemia of chronic disease   . CHF (congestive heart failure) ([place])   . Chronic diarrhea   . Glaucoma   . HTN (hypertension)   . Hypertension   . Hypothyroid   . Obesity   . Skin cancer     Family History  Problem Relation Age of Onset  . Hypertension Mother   . Thyroid disease Mother   . Stroke Mother   . Kidney disease Father   . Diabetes Father     Past Surgical History:  Procedure Laterality Date  . GASTRIC BYPASS    . GASTRIC RESTRICTION SURGERY    . TUBAL LIGATION     Social History   Occupational History   . Not on file  Tobacco Use  . Smoking status: Former Research scientist (life sciences)  . Smokeless tobacco: Never Used  Substance and Sexual Activity  . Alcohol use: Never    Comment: 1 glass of wine weekly  . Drug use: No  . Sexual activity: Not on file

## 2020-01-06 DIAGNOSIS — M25561 Pain in right knee: Secondary | ICD-10-CM | POA: Diagnosis not present

## 2020-01-06 DIAGNOSIS — R262 Difficulty in walking, not elsewhere classified: Secondary | ICD-10-CM | POA: Diagnosis not present

## 2020-01-06 DIAGNOSIS — M25562 Pain in left knee: Secondary | ICD-10-CM | POA: Diagnosis not present

## 2020-01-06 DIAGNOSIS — M545 Low back pain: Secondary | ICD-10-CM | POA: Diagnosis not present

## 2020-01-08 DIAGNOSIS — R262 Difficulty in walking, not elsewhere classified: Secondary | ICD-10-CM | POA: Diagnosis not present

## 2020-01-08 DIAGNOSIS — M25562 Pain in left knee: Secondary | ICD-10-CM | POA: Diagnosis not present

## 2020-01-08 DIAGNOSIS — M545 Low back pain: Secondary | ICD-10-CM | POA: Diagnosis not present

## 2020-01-08 DIAGNOSIS — M25561 Pain in right knee: Secondary | ICD-10-CM | POA: Diagnosis not present

## 2020-01-11 DIAGNOSIS — R262 Difficulty in walking, not elsewhere classified: Secondary | ICD-10-CM | POA: Diagnosis not present

## 2020-01-11 DIAGNOSIS — M25562 Pain in left knee: Secondary | ICD-10-CM | POA: Diagnosis not present

## 2020-01-11 DIAGNOSIS — M545 Low back pain: Secondary | ICD-10-CM | POA: Diagnosis not present

## 2020-01-11 DIAGNOSIS — M25561 Pain in right knee: Secondary | ICD-10-CM | POA: Diagnosis not present

## 2020-01-18 ENCOUNTER — Other Ambulatory Visit: Payer: Self-pay

## 2020-01-18 ENCOUNTER — Ambulatory Visit (INDEPENDENT_AMBULATORY_CARE_PROVIDER_SITE_OTHER): Payer: Medicare Other | Admitting: Orthopaedic Surgery

## 2020-01-18 ENCOUNTER — Encounter: Payer: Self-pay | Admitting: Orthopaedic Surgery

## 2020-01-18 DIAGNOSIS — M4807 Spinal stenosis, lumbosacral region: Secondary | ICD-10-CM

## 2020-01-18 DIAGNOSIS — M1711 Unilateral primary osteoarthritis, right knee: Secondary | ICD-10-CM

## 2020-01-18 DIAGNOSIS — R262 Difficulty in walking, not elsewhere classified: Secondary | ICD-10-CM | POA: Diagnosis not present

## 2020-01-18 DIAGNOSIS — M25561 Pain in right knee: Secondary | ICD-10-CM | POA: Diagnosis not present

## 2020-01-18 DIAGNOSIS — M545 Low back pain: Secondary | ICD-10-CM | POA: Diagnosis not present

## 2020-01-18 DIAGNOSIS — M25562 Pain in left knee: Secondary | ICD-10-CM | POA: Diagnosis not present

## 2020-01-18 MED ORDER — GABAPENTIN 300 MG PO CAPS: 300.0000 mg | ORAL_CAPSULE | Freq: Every day | ORAL | 1 refills | Status: DC

## 2020-01-18 NOTE — Progress Notes (Signed)
The patient comes in today with continued complaints of low back pain with sciatic symptoms to the left side that radiates down her left leg.  We put her on a 6-day steroid taper and have her rest her back.  She has had a remote history of an MRI done over 10 years ago.  At that time she had significant facet joint arthritis but now she is having significant radicular problems.  She has never had any type of spinal intervention such as surgery or injections.  On exam she has a positive straight leg raise on the left side with radicular symptoms in the L4 distribution down the side of her leg.  She has pain to palpation along the lower aspect of the lumbar spine into the left side of the pelvis.  At this point a MRI of the lumbar spine is warranted.  We have tried other forms of conservative treatment that is failed.  Her plain films of the lumbar spine are reviewed and does not show any significant malalignment.  I want her to try 300 mg of gabapentin at night and we will set her up for an MRI of her lumbar spine and go from there.  All questions and concerns were answered and addressed.

## 2020-01-21 ENCOUNTER — Other Ambulatory Visit: Payer: Self-pay | Admitting: Internal Medicine

## 2020-01-21 DIAGNOSIS — M545 Low back pain: Secondary | ICD-10-CM | POA: Diagnosis not present

## 2020-01-21 DIAGNOSIS — Z1231 Encounter for screening mammogram for malignant neoplasm of breast: Secondary | ICD-10-CM

## 2020-01-21 DIAGNOSIS — M25561 Pain in right knee: Secondary | ICD-10-CM | POA: Diagnosis not present

## 2020-01-21 DIAGNOSIS — M25562 Pain in left knee: Secondary | ICD-10-CM | POA: Diagnosis not present

## 2020-01-21 DIAGNOSIS — R262 Difficulty in walking, not elsewhere classified: Secondary | ICD-10-CM | POA: Diagnosis not present

## 2020-01-27 DIAGNOSIS — M5432 Sciatica, left side: Secondary | ICD-10-CM | POA: Diagnosis not present

## 2020-01-27 DIAGNOSIS — R0989 Other specified symptoms and signs involving the circulatory and respiratory systems: Secondary | ICD-10-CM | POA: Diagnosis not present

## 2020-01-27 DIAGNOSIS — R42 Dizziness and giddiness: Secondary | ICD-10-CM | POA: Diagnosis not present

## 2020-01-27 DIAGNOSIS — Z6841 Body Mass Index (BMI) 40.0 and over, adult: Secondary | ICD-10-CM | POA: Diagnosis not present

## 2020-01-27 DIAGNOSIS — R5383 Other fatigue: Secondary | ICD-10-CM | POA: Diagnosis not present

## 2020-01-27 DIAGNOSIS — Z23 Encounter for immunization: Secondary | ICD-10-CM | POA: Diagnosis not present

## 2020-02-01 ENCOUNTER — Ambulatory Visit: Payer: Medicare Other | Admitting: Orthopaedic Surgery

## 2020-02-07 ENCOUNTER — Ambulatory Visit
Admission: RE | Admit: 2020-02-07 | Discharge: 2020-02-07 | Disposition: A | Discharge status: Home / Self Care | Payer: Medicare Other | Source: Ambulatory Visit | Attending: Orthopaedic Surgery | Admitting: Orthopaedic Surgery

## 2020-02-07 ENCOUNTER — Other Ambulatory Visit: Payer: Self-pay

## 2020-02-07 DIAGNOSIS — M545 Low back pain, unspecified: Secondary | ICD-10-CM | POA: Diagnosis not present

## 2020-02-07 DIAGNOSIS — M4807 Spinal stenosis, lumbosacral region: Secondary | ICD-10-CM

## 2020-02-07 DIAGNOSIS — M48061 Spinal stenosis, lumbar region without neurogenic claudication: Secondary | ICD-10-CM | POA: Diagnosis not present

## 2020-02-08 ENCOUNTER — Other Ambulatory Visit (HOSPITAL_COMMUNITY): Payer: Self-pay | Admitting: Internal Medicine

## 2020-02-08 DIAGNOSIS — R0989 Other specified symptoms and signs involving the circulatory and respiratory systems: Secondary | ICD-10-CM

## 2020-02-09 ENCOUNTER — Other Ambulatory Visit: Payer: Self-pay

## 2020-02-09 ENCOUNTER — Ambulatory Visit (HOSPITAL_COMMUNITY)
Admission: RE | Admit: 2020-02-09 | Discharge: 2020-02-09 | Disposition: A | Discharge status: Home / Self Care | Payer: Medicare Other | Source: Ambulatory Visit | Attending: Internal Medicine | Admitting: Internal Medicine

## 2020-02-09 ENCOUNTER — Encounter: Payer: Self-pay | Admitting: Orthopaedic Surgery

## 2020-02-09 ENCOUNTER — Ambulatory Visit (INDEPENDENT_AMBULATORY_CARE_PROVIDER_SITE_OTHER): Payer: Medicare Other | Admitting: Orthopaedic Surgery

## 2020-02-09 DIAGNOSIS — M4807 Spinal stenosis, lumbosacral region: Secondary | ICD-10-CM

## 2020-02-09 DIAGNOSIS — R0989 Other specified symptoms and signs involving the circulatory and respiratory systems: Secondary | ICD-10-CM

## 2020-02-09 DIAGNOSIS — M545 Low back pain, unspecified: Secondary | ICD-10-CM

## 2020-02-09 NOTE — Progress Notes (Signed)
The patient comes in today to go over a MRI of her lumbar spine.  She is still having radicular symptoms with low back pain as well.  The radicular symptoms were down her left leg.  She also is dealing with significant arthritis of both her knees.  She is someone who has a high BMI as well.  She has been to physical therapy and that has not really helped her.  She said that really made it worse.  She says the steroid injections were about to wear off in her knees and she would like to consider these at some point again once she heads to [place] for the winter.  The MRI of her lumbar spine is reviewed with her.  There is left-sided disc protrusion at L4-L5 that appears to be encroaching the left L5 nerve root.  She also has significant facet disease at L4-L5 and L5-S1.  She is interested in seeing Dr. Ernestina Patches for a left-sided ESI.  I believe this should be likely at L4-L5.  We will work on getting that scheduled.  She will schedule follow-up appoint with me sometime before she heads back to [place] so we can place steroid injections in both knees.  All questions and concerns were answered and addressed.

## 2020-02-10 DIAGNOSIS — Z23 Encounter for immunization: Secondary | ICD-10-CM | POA: Diagnosis not present

## 2020-02-15 DIAGNOSIS — Z780 Asymptomatic menopausal state: Secondary | ICD-10-CM | POA: Diagnosis not present

## 2020-02-15 DIAGNOSIS — M85851 Other specified disorders of bone density and structure, right thigh: Secondary | ICD-10-CM | POA: Diagnosis not present

## 2020-02-15 DIAGNOSIS — M810 Age-related osteoporosis without current pathological fracture: Secondary | ICD-10-CM | POA: Diagnosis not present

## 2020-02-22 ENCOUNTER — Encounter: Payer: Self-pay | Admitting: Physical Medicine and Rehabilitation

## 2020-02-22 ENCOUNTER — Ambulatory Visit: Payer: Self-pay

## 2020-02-22 ENCOUNTER — Ambulatory Visit (INDEPENDENT_AMBULATORY_CARE_PROVIDER_SITE_OTHER): Payer: Medicare Other | Admitting: Physical Medicine and Rehabilitation

## 2020-02-22 ENCOUNTER — Other Ambulatory Visit: Payer: Self-pay

## 2020-02-22 ENCOUNTER — Ambulatory Visit: Payer: Medicare Other

## 2020-02-22 VITALS — BP 176/77 | HR 64

## 2020-02-22 DIAGNOSIS — M5416 Radiculopathy, lumbar region: Secondary | ICD-10-CM

## 2020-02-22 MED ORDER — METHYLPREDNISOLONE ACETATE 80 MG/ML IJ SUSP
80.0000 mg | Freq: Once | INTRAMUSCULAR | Status: AC
  Administered 2020-02-22: 80 mg

## 2020-02-22 NOTE — Progress Notes (Signed)
Pt state lower back pain. Pt state when getting up in the morning she has to walk with a walker. Pt state the pain is worse when she laying down flat. Pt state bending over and walking feels bad too. Pt state she takes meds to help ease the pain.  Numeric Pain Rating Scale and Functional Assessment Average Pain 4   In the last MONTH (on 0-10 scale) has pain interfered with the following?  1. General activity like being  able to carry out your everyday physical activities such as walking, climbing stairs, carrying groceries, or moving a chair?  Rating(7)   +Driver, -BT, -Dye Allergies.

## 2020-02-23 NOTE — Procedures (Signed)
Lumbar Epidural Steroid Injection - Interlaminar Approach with Fluoroscopic Guidance  Patient: Lynn Joseph      Date of Birth: 07/14/1945 MRN: [id number] PCP: Velna Hatchet, MD      Visit Date: 02/22/2020   Universal Protocol:     Consent Given By: the patient  Position: PRONE  Additional Comments: Vital signs were monitored before and after the procedure. Patient was prepped and draped in the usual sterile fashion. The correct patient, procedure, and site was verified.   Injection Procedure Details:   Procedure diagnoses:  1. Lumbar radiculopathy      Meds Administered:  Meds ordered this encounter  Medications  . methylPREDNISolone acetate (DEPO-MEDROL) injection 80 mg     Laterality: Left  Location/Site:  L5-S1  Needle size: 20 G 4.5 in  Needle type: Tuohy  Needle Placement: Paramedian epidural  Findings:   -Comments: Excellent flow of contrast into the epidural space.  Initially when ligamentum flavum was engaged she did have feeling of pain in the left radicular pattern.  This was reviewed with fluoroscopic biplanar imaging and was really at the level of the ligamentum flavum.  Tightness of the lateral recess however was probably irritating the nerve.  We did redirect an injection and smoothly.  Procedure Details: Using a paramedian approach from the side mentioned above, the region overlying the inferior lamina was localized under fluoroscopic visualization and the soft tissues overlying this structure were infiltrated with 4 ml. of 1% Lidocaine without Epinephrine. The Tuohy needle was inserted into the epidural space using a paramedian approach.   The epidural space was localized using loss of resistance along with counter oblique bi-planar fluoroscopic views.  After negative aspirate for air, blood, and CSF, a 2 ml. volume of Isovue-250 was injected into the epidural space and the flow of contrast was observed. Radiographs were obtained for  documentation purposes.    The injectate was administered into the level noted above.   Additional Comments:  The patient tolerated the procedure well Dressing: 2 x 2 sterile gauze and Band-Aid    Post-procedure details: Patient was observed during the procedure. Post-procedure instructions were reviewed.  Patient left the clinic in stable condition.

## 2020-02-23 NOTE — Progress Notes (Signed)
Lynn Joseph - 74 y.o. female MRN [id number]  Date of birth: 1946/03/24  Office Visit Note: Visit Date: 02/22/2020 PCP: Velna Hatchet, MD Referred by: Velna Hatchet, MD  Subjective: Chief Complaint  Patient presents with  . Lower Back - Pain   HPI:  [name] is a 74 y.o. female who comes in today at the request of Dr. Jean Rosenthal for planned Left L5-S1 Lumbar epidural steroid injection with fluoroscopic guidance.  The patient has failed conservative care including home exercise, medications, time and activity modification.  This injection will be diagnostic and hopefully therapeutic.  Please see requesting physician notes for further details and justification.  MRI reviewed with images and spine model.  MRI reviewed in the note below.  Unfortunately MRI with multilevel degenerative facet arthropathy and lateral recess narrowing and central canal stenosis particular at L4-5.  Left paracentral disc protrusion on top of the facet arthropathy causing quite a bit of stenosis at L4-5.  Because her left-sided symptoms seem to be predominant I do think it is disc protrusion at L4-5.  She is probably learned to live with the arthritic pain to some degree.  Her case is complicated by obesity.  At this point left L5-S1 interlaminar injection.  Consider L4 transforaminal approach or potential for facet joint block if more back pain.  She does live part of the winter in [place] and she is leaving in a few weeks.  She likely can find somebody in [place] to also look at her spine.  Will help if we can try to find somebody if she needed it.  ROS Otherwise per HPI.  Assessment & Plan: Visit Diagnoses:  1. Lumbar radiculopathy     Plan: No additional findings.   Meds & Orders:  Meds ordered this encounter  Medications  . methylPREDNISolone acetate (DEPO-MEDROL) injection 80 mg    Orders Placed This Encounter  Procedures  . XR C-ARM NO REPORT  . Epidural Steroid injection      Follow-up: No follow-ups on file.   Procedures: No procedures performed  Lumbar Epidural Steroid Injection - Interlaminar Approach with Fluoroscopic Guidance  Patient: Lynn Joseph      Date of Birth: 01-Jun-1945 MRN: [id number] PCP: Velna Hatchet, MD      Visit Date: 02/22/2020   Universal Protocol:     Consent Given By: the patient  Position: PRONE  Additional Comments: Vital signs were monitored before and after the procedure. Patient was prepped and draped in the usual sterile fashion. The correct patient, procedure, and site was verified.   Injection Procedure Details:   Procedure diagnoses:  1. Lumbar radiculopathy      Meds Administered:  Meds ordered this encounter  Medications  . methylPREDNISolone acetate (DEPO-MEDROL) injection 80 mg     Laterality: Left  Location/Site:  L5-S1  Needle size: 20 G 4.5 in  Needle type: Tuohy  Needle Placement: Paramedian epidural  Findings:   -Comments: Excellent flow of contrast into the epidural space.  Initially when ligamentum flavum was engaged she did have feeling of pain in the left radicular pattern.  This was reviewed with fluoroscopic biplanar imaging and was really at the level of the ligamentum flavum.  Tightness of the lateral recess however was probably irritating the nerve.  We did redirect an injection and smoothly.  Procedure Details: Using a paramedian approach from the side mentioned above, the region overlying the inferior lamina was localized under fluoroscopic visualization and the soft tissues overlying this structure were  infiltrated with 4 ml. of 1% Lidocaine without Epinephrine. The Tuohy needle was inserted into the epidural space using a paramedian approach.   The epidural space was localized using loss of resistance along with counter oblique bi-planar fluoroscopic views.  After negative aspirate for air, blood, and CSF, a 2 ml. volume of Isovue-250 was injected into the epidural space  and the flow of contrast was observed. Radiographs were obtained for documentation purposes.    The injectate was administered into the level noted above.   Additional Comments:  The patient tolerated the procedure well Dressing: 2 x 2 sterile gauze and Band-Aid    Post-procedure details: Patient was observed during the procedure. Post-procedure instructions were reviewed.  Patient left the clinic in stable condition.     Clinical History: MRI LUMBAR SPINE WITHOUT CONTRAST  TECHNIQUE: Multiplanar, multisequence MR imaging of the lumbar spine was performed. No intravenous contrast was administered.  COMPARISON:  Lumbar spine radiographs 12/21/2019  FINDINGS: Segmentation:  Standard.  Alignment: Trace anterolisthesis of L3 on L4, L4 on L5, and L5 on S1.  Vertebrae: No fracture, suspicious osseous lesion, or significant marrow edema.  Conus medullaris and cauda equina: Conus extends to the L1 level. Conus and cauda equina appear normal.  Paraspinal and other soft tissues: Unremarkable.  Disc levels:  Disc desiccation from L2-3 to L5-S1. Mild disc space narrowing at L4-5.  T11-12: Only imaged sagittally. Minimal disc bulging and mild facet arthrosis without stenosis.  T12-L1: Negative.  L1-2: Negative.  L2-3: Disc bulging, a right subarticular to right extraforaminal disc protrusion, and moderate facet hypertrophy result in mild-to-moderate right and mild left lateral recess stenosis and mild-to-moderate right neural foraminal stenosis without significant spinal stenosis.  L3-4: Mild disc bulging and moderate facet and ligamentum flavum hypertrophy result in mild-to-moderate bilateral lateral recess stenosis without significant spinal or neural foraminal stenosis. Small right foraminal and extraforaminal annular fissure.  L4-5: Disc bulging, a moderate-sized left paracentral and subarticular disc protrusion, and moderate right and severe  left facet and ligamentum flavum hypertrophy result in moderate to severe spinal stenosis, moderate right and severe left lateral recess stenosis, and mild-to-moderate right and mild left neural foraminal stenosis. Left L5 nerve root impingement in the lateral recess. Small facet joint effusions.  L5-S1: Anterolisthesis with bulging uncovered disc and severe facet hypertrophy result in mild spinal stenosis, moderate bilateral lateral recess stenosis, and mild-to-moderate bilateral neural foraminal stenosis.  IMPRESSION: 1. Multilevel lumbar disc and facet degeneration, most notable at L4-5 where a disc protrusion results in severe left lateral recess stenosis and L5 nerve root impingement. 2. Severe facet arthrosis at L5-S1 with grade 1 anterolisthesis, moderate lateral recess stenosis, and mild-to-moderate neural foraminal stenosis.   Electronically Signed   By: Logan Bores M.D.   On: 02/07/2020 19:07     Objective:  VS:  HT:    WT:   BMI:     BP:(!) 176/77  HR:64bpm  TEMP: ( )  RESP:  Physical Exam Constitutional:      General: She is not in acute distress.    Appearance: Normal appearance. She is obese. She is not ill-appearing.  HENT:     Head: Normocephalic and atraumatic.     Right Ear: External ear normal.     Left Ear: External ear normal.  Eyes:     Extraocular Movements: Extraocular movements intact.  Cardiovascular:     Rate and Rhythm: Normal rate.     Pulses: Normal pulses.  Musculoskeletal:     Right  lower leg: No edema.     Left lower leg: No edema.     Comments: Patient has good distal strength with no pain over the greater trochanters.  No clonus or focal weakness.Patient somewhat slow to rise from a seated position to full extension.  There is concordant low back pain with facet loading and lumbar spine extension rotation.  There are no definitive trigger points but the patient is somewhat tender across the lower back and PSIS.  There is no  pain with hip rotation.   Skin:    Findings: No erythema, lesion or rash.  Neurological:     General: No focal deficit present.     Mental Status: She is alert and oriented to person, place, and time.     Sensory: No sensory deficit.     Motor: No weakness or abnormal muscle tone.     Coordination: Coordination normal.  Psychiatric:        Mood and Affect: Mood normal.        Behavior: Behavior normal.      Imaging: XR C-ARM NO REPORT  Result Date: 02/22/2020 Please see Notes tab for imaging impression.

## 2020-02-24 ENCOUNTER — Ambulatory Visit (INDEPENDENT_AMBULATORY_CARE_PROVIDER_SITE_OTHER): Payer: Medicare Other | Admitting: Physician Assistant

## 2020-02-24 ENCOUNTER — Encounter: Payer: Self-pay | Admitting: Physician Assistant

## 2020-02-24 DIAGNOSIS — M1712 Unilateral primary osteoarthritis, left knee: Secondary | ICD-10-CM | POA: Diagnosis not present

## 2020-02-24 DIAGNOSIS — Z5320 Procedure and treatment not carried out because of patient's decision for unspecified reasons: Secondary | ICD-10-CM | POA: Insufficient documentation

## 2020-02-24 DIAGNOSIS — E039 Hypothyroidism, unspecified: Secondary | ICD-10-CM | POA: Insufficient documentation

## 2020-02-24 DIAGNOSIS — M1711 Unilateral primary osteoarthritis, right knee: Secondary | ICD-10-CM | POA: Diagnosis not present

## 2020-02-24 DIAGNOSIS — I10 Essential (primary) hypertension: Secondary | ICD-10-CM | POA: Insufficient documentation

## 2020-02-24 DIAGNOSIS — K909 Intestinal malabsorption, unspecified: Secondary | ICD-10-CM | POA: Insufficient documentation

## 2020-02-24 DIAGNOSIS — D638 Anemia in other chronic diseases classified elsewhere: Secondary | ICD-10-CM | POA: Insufficient documentation

## 2020-02-24 MED ORDER — LIDOCAINE HCL 1 % IJ SOLN
0.5000 mL | INTRAMUSCULAR | Status: AC | PRN
  Administered 2020-02-24: .5 mL

## 2020-02-24 MED ORDER — METHYLPREDNISOLONE ACETATE 40 MG/ML IJ SUSP
40.0000 mg | INTRAMUSCULAR | Status: AC | PRN
  Administered 2020-02-24: 40 mg via INTRA_ARTICULAR

## 2020-02-24 NOTE — Progress Notes (Signed)
   Procedure Note  Patient: Lynn Joseph             Date of Birth: Sep 29, 1945           MRN: [id number]             Visit Date: 02/24/2020 HPI: Mrs. Denis comes in today requesting bilateral knee injections.  She last had an injection of both knees back in June and did well until recently.  She states overall her knees are doing okay but prior to doing to [place] for the next 2 months wants to have injections.  She does state that the epidural steroid injection for more back did help this was performed on 02/22/2020.  Patient is nondiabetic.  Physical exam: Bilateral knees no abnormal warmth erythema or effusion.  Overall good range of motion both knees. Procedures: Visit Diagnoses:  1. Primary osteoarthritis of left knee   2. Primary osteoarthritis of right knee     Large Joint Inj: bilateral knee on 02/24/2020 2:06 PM Indications: pain Details: 22 G 1.5 in needle, anterolateral approach  Arthrogram: No  Medications (Right): 0.5 mL lidocaine 1 %; 40 mg methylPREDNISolone acetate 40 MG/ML Medications (Left): 0.5 mL lidocaine 1 %; 40 mg methylPREDNISolone acetate 40 MG/ML Outcome: tolerated well, no immediate complications Procedure, treatment alternatives, risks and benefits explained, specific risks discussed. Consent was given by the patient. Immediately prior to procedure a time out was called to verify the correct patient, procedure, equipment, support staff and site/side marked as required. Patient was prepped and draped in the usual sterile fashion.     Plan: She will follow up with [name] as needed she understands she needs to wait at least 3 months between injections in her knees.

## 2020-03-03 ENCOUNTER — Ambulatory Visit: Payer: Medicare Other | Admitting: Physical Medicine and Rehabilitation

## 2020-03-07 ENCOUNTER — Telehealth: Payer: Self-pay

## 2020-03-07 ENCOUNTER — Ambulatory Visit: Payer: Medicare Other | Admitting: Physical Medicine and Rehabilitation

## 2020-03-07 NOTE — Telephone Encounter (Signed)
L4 tf esi

## 2020-03-07 NOTE — Telephone Encounter (Signed)
Patient had left L5-S1 IL on 02/22/20. She reports good relief for the first few days. She reports that she is having a lot of leg pain and left foor and lower leg numbness. Please advise.

## 2020-03-07 NOTE — Telephone Encounter (Signed)
Patient called she stated she is going out of town for 2 months she wants to schedule an appointment with Dr.Newton for her 2nd  injection in her lower back on the left side. Call [phone]

## 2020-03-07 NOTE — Telephone Encounter (Signed)
Scheduled for 11/29 with driver.

## 2020-03-14 ENCOUNTER — Other Ambulatory Visit: Payer: Self-pay

## 2020-03-14 ENCOUNTER — Encounter: Payer: Self-pay | Admitting: Physical Medicine and Rehabilitation

## 2020-03-14 ENCOUNTER — Ambulatory Visit (INDEPENDENT_AMBULATORY_CARE_PROVIDER_SITE_OTHER): Payer: Medicare Other | Admitting: Physical Medicine and Rehabilitation

## 2020-03-14 ENCOUNTER — Ambulatory Visit: Payer: Self-pay

## 2020-03-14 VITALS — BP 177/74 | HR 67

## 2020-03-14 DIAGNOSIS — M5416 Radiculopathy, lumbar region: Secondary | ICD-10-CM | POA: Diagnosis not present

## 2020-03-14 MED ORDER — METHYLPREDNISOLONE ACETATE 80 MG/ML IJ SUSP
80.0000 mg | Freq: Once | INTRAMUSCULAR | Status: AC
  Administered 2020-03-14: 80 mg

## 2020-03-14 NOTE — Progress Notes (Signed)
Pt state lower back pain that travel down her left leg to a numbing feelings down to her calf and toes.. Pt state walking and sometimes sitting makes the pain worse. Pt has hx of inj on 02/22/20 Pt state it was great and lasted a few days.  Numeric Pain Rating Scale and Functional Assessment Average Pain 5   In the last MONTH (on 0-10 scale) has pain interfered with the following?  1. General activity like being  able to carry out your everyday physical activities such as walking, climbing stairs, carrying groceries, or moving a chair?  Rating(10)   +Driver, -BT, -Dye Allergies.

## 2020-03-21 ENCOUNTER — Ambulatory Visit: Payer: Medicare Other | Admitting: Physical Medicine and Rehabilitation

## 2020-05-10 ENCOUNTER — Ambulatory Visit: Payer: Medicare Other

## 2020-05-17 ENCOUNTER — Ambulatory Visit
Admission: RE | Admit: 2020-05-17 | Discharge: 2020-05-17 | Disposition: A | Discharge status: Home / Self Care | Payer: Medicare Other | Source: Ambulatory Visit | Attending: Internal Medicine | Admitting: Internal Medicine

## 2020-05-17 ENCOUNTER — Other Ambulatory Visit: Payer: Self-pay

## 2020-05-17 DIAGNOSIS — Z1231 Encounter for screening mammogram for malignant neoplasm of breast: Secondary | ICD-10-CM

## 2020-05-23 NOTE — Procedures (Signed)
Lumbosacral Transforaminal Epidural Steroid Injection - Sub-Pedicular Approach with Fluoroscopic Guidance  Patient: Lynn Joseph      Date of Birth: 1946-04-10 MRN: [id number] PCP: Velna Hatchet, MD      Visit Date: 03/14/2020   Universal Protocol:    Date/Time: 03/14/2020  Consent Given By: the patient  Position: PRONE  Additional Comments: Vital signs were monitored before and after the procedure. Patient was prepped and draped in the usual sterile fashion. The correct patient, procedure, and site was verified.   Injection Procedure Details:   Procedure diagnoses: Lumbar radiculopathy [M54.16]    Meds Administered:  Meds ordered this encounter  Medications  . methylPREDNISolone acetate (DEPO-MEDROL) injection 80 mg    Laterality: Left  Location/Site:  L5-S1  Needle:5.0 in., 22 ga.  Short bevel or Quincke spinal needle  Needle Placement: Transforaminal  Findings:    -Comments: Excellent flow of contrast along the nerve, nerve root and into the epidural space.  Procedure Details: After squaring off the end-plates to get a true AP view, the C-arm was positioned so that an oblique view of the foramen as noted above was visualized. The target area is just inferior to the "nose of the scotty dog" or sub pedicular. The soft tissues overlying this structure were infiltrated with 2-3 ml. of 1% Lidocaine without Epinephrine.  The spinal needle was inserted toward the target using a "trajectory" view along the fluoroscope beam.  Under AP and lateral visualization, the needle was advanced so it did not puncture dura and was located close the 6 O'Clock position of the pedical in AP tracterory. Biplanar projections were used to confirm position. Aspiration was confirmed to be negative for CSF and/or blood. A 1-2 ml. volume of Isovue-250 was injected and flow of contrast was noted at each level. Radiographs were obtained for documentation purposes.   After attaining the  desired flow of contrast documented above, a 0.5 to 1.0 ml test dose of 0.25% Marcaine was injected into each respective transforaminal space.  The patient was observed for 90 seconds post injection.  After no sensory deficits were reported, and normal lower extremity motor function was noted,   the above injectate was administered so that equal amounts of the injectate were placed at each foramen (level) into the transforaminal epidural space.   Additional Comments:  The patient tolerated the procedure well Dressing: 2 x 2 sterile gauze and Band-Aid    Post-procedure details: Patient was observed during the procedure. Post-procedure instructions were reviewed.  Patient left the clinic in stable condition.

## 2020-05-23 NOTE — Progress Notes (Signed)
Lynn Joseph - 74 y.o. female MRN [id number]  Date of birth: 05-28-1945  Office Visit Note: Visit Date: 03/14/2020 PCP: Velna Hatchet, MD Referred by: Velna Hatchet, MD  Subjective: Chief Complaint  Patient presents with  . Lower Back - Pain  . Left Leg - Numbness, Pain  . Left Foot - Numbness   HPI:  Lynn Joseph is a 75 y.o. female who comes in today at the request of Dr. Jean Rosenthal for planned Left L5-S1 Lumbar epidural steroid injection with fluoroscopic guidance.  The patient has failed conservative care including home exercise, medications, time and activity modification.  This injection will be diagnostic and hopefully therapeutic.  Please see requesting physician notes for further details and justification.   ROS Otherwise per HPI.  Assessment & Plan: Visit Diagnoses:    ICD-10-CM   1. Lumbar radiculopathy  M54.16 XR C-ARM NO REPORT    Epidural Steroid injection    methylPREDNISolone acetate (DEPO-MEDROL) injection 80 mg    Plan: No additional findings.   Meds & Orders:  Meds ordered this encounter  Medications  . methylPREDNISolone acetate (DEPO-MEDROL) injection 80 mg    Orders Placed This Encounter  Procedures  . XR C-ARM NO REPORT  . Epidural Steroid injection    Follow-up: Return if symptoms worsen or fail to improve.   Procedures: No procedures performed  Lumbosacral Transforaminal Epidural Steroid Injection - Sub-Pedicular Approach with Fluoroscopic Guidance  Patient: Lynn Joseph      Date of Birth: 24-Dec-1945 MRN: [id number] PCP: Velna Hatchet, MD      Visit Date: 03/14/2020   Universal Protocol:    Date/Time: 03/14/2020  Consent Given By: the patient  Position: PRONE  Additional Comments: Vital signs were monitored before and after the procedure. Patient was prepped and draped in the usual sterile fashion. The correct patient, procedure, and site was verified.   Injection Procedure Details:   Procedure  diagnoses: Lumbar radiculopathy [M54.16]    Meds Administered:  Meds ordered this encounter  Medications  . methylPREDNISolone acetate (DEPO-MEDROL) injection 80 mg    Laterality: Left  Location/Site:  L5-S1  Needle:5.0 in., 22 ga.  Short bevel or Quincke spinal needle  Needle Placement: Transforaminal  Findings:    -Comments: Excellent flow of contrast along the nerve, nerve root and into the epidural space.  Procedure Details: After squaring off the end-plates to get a true AP view, the C-arm was positioned so that an oblique view of the foramen as noted above was visualized. The target area is just inferior to the "nose of the scotty dog" or sub pedicular. The soft tissues overlying this structure were infiltrated with 2-3 ml. of 1% Lidocaine without Epinephrine.  The spinal needle was inserted toward the target using a "trajectory" view along the fluoroscope beam.  Under AP and lateral visualization, the needle was advanced so it did not puncture dura and was located close the 6 O'Clock position of the pedical in AP tracterory. Biplanar projections were used to confirm position. Aspiration was confirmed to be negative for CSF and/or blood. A 1-2 ml. volume of Isovue-250 was injected and flow of contrast was noted at each level. Radiographs were obtained for documentation purposes.   After attaining the desired flow of contrast documented above, a 0.5 to 1.0 ml test dose of 0.25% Marcaine was injected into each respective transforaminal space.  The patient was observed for 90 seconds post injection.  After no sensory deficits were reported, and normal lower extremity motor  function was noted,   the above injectate was administered so that equal amounts of the injectate were placed at each foramen (level) into the transforaminal epidural space.   Additional Comments:  The patient tolerated the procedure well Dressing: 2 x 2 sterile gauze and Band-Aid    Post-procedure  details: Patient was observed during the procedure. Post-procedure instructions were reviewed.  Patient left the clinic in stable condition.      Clinical History: MRI LUMBAR SPINE WITHOUT CONTRAST  TECHNIQUE: Multiplanar, multisequence MR imaging of the lumbar spine was performed. No intravenous contrast was administered.  COMPARISON:  Lumbar spine radiographs 12/21/2019  FINDINGS: Segmentation:  Standard.  Alignment: Trace anterolisthesis of L3 on L4, L4 on L5, and L5 on S1.  Vertebrae: No fracture, suspicious osseous lesion, or significant marrow edema.  Conus medullaris and cauda equina: Conus extends to the L1 level. Conus and cauda equina appear normal.  Paraspinal and other soft tissues: Unremarkable.  Disc levels:  Disc desiccation from L2-3 to L5-S1. Mild disc space narrowing at L4-5.  T11-12: Only imaged sagittally. Minimal disc bulging and mild facet arthrosis without stenosis.  T12-L1: Negative.  L1-2: Negative.  L2-3: Disc bulging, a right subarticular to right extraforaminal disc protrusion, and moderate facet hypertrophy result in mild-to-moderate right and mild left lateral recess stenosis and mild-to-moderate right neural foraminal stenosis without significant spinal stenosis.  L3-4: Mild disc bulging and moderate facet and ligamentum flavum hypertrophy result in mild-to-moderate bilateral lateral recess stenosis without significant spinal or neural foraminal stenosis. Small right foraminal and extraforaminal annular fissure.  L4-5: Disc bulging, a moderate-sized left paracentral and subarticular disc protrusion, and moderate right and severe left facet and ligamentum flavum hypertrophy result in moderate to severe spinal stenosis, moderate right and severe left lateral recess stenosis, and mild-to-moderate right and mild left neural foraminal stenosis. Left L5 nerve root impingement in the lateral recess. Small facet joint  effusions.  L5-S1: Anterolisthesis with bulging uncovered disc and severe facet hypertrophy result in mild spinal stenosis, moderate bilateral lateral recess stenosis, and mild-to-moderate bilateral neural foraminal stenosis.  IMPRESSION: 1. Multilevel lumbar disc and facet degeneration, most notable at L4-5 where a disc protrusion results in severe left lateral recess stenosis and L5 nerve root impingement. 2. Severe facet arthrosis at L5-S1 with grade 1 anterolisthesis, moderate lateral recess stenosis, and mild-to-moderate neural foraminal stenosis.   Electronically Signed   By: Logan Bores M.D.   On: 02/07/2020 19:07     Objective:  VS:  HT:    WT:   BMI:     BP:(!) 177/74  HR:67bpm  TEMP: ( )  RESP:  Physical Exam Vitals and nursing note reviewed.  Constitutional:      General: She is not in acute distress.    Appearance: Normal appearance. She is not ill-appearing.  HENT:     Head: Normocephalic and atraumatic.     Right Ear: External ear normal.     Left Ear: External ear normal.  Eyes:     Extraocular Movements: Extraocular movements intact.  Cardiovascular:     Rate and Rhythm: Normal rate.     Pulses: Normal pulses.  Pulmonary:     Effort: Pulmonary effort is normal. No respiratory distress.  Abdominal:     General: There is no distension.     Palpations: Abdomen is soft.  Musculoskeletal:        General: Tenderness present.     Cervical back: Neck supple.     Right lower leg: No  edema.     Left lower leg: No edema.     Comments: Patient has good distal strength with no pain over the greater trochanters.  No clonus or focal weakness.  Skin:    Findings: No erythema, lesion or rash.  Neurological:     General: No focal deficit present.     Mental Status: She is alert and oriented to person, place, and time.     Sensory: No sensory deficit.     Motor: No weakness or abnormal muscle tone.     Coordination: Coordination normal.  Psychiatric:         Mood and Affect: Mood normal.        Behavior: Behavior normal.      Imaging: No results found.

## 2020-06-22 DIAGNOSIS — M859 Disorder of bone density and structure, unspecified: Secondary | ICD-10-CM | POA: Diagnosis not present

## 2020-06-22 DIAGNOSIS — E039 Hypothyroidism, unspecified: Secondary | ICD-10-CM | POA: Diagnosis not present

## 2020-06-22 DIAGNOSIS — E785 Hyperlipidemia, unspecified: Secondary | ICD-10-CM | POA: Diagnosis not present

## 2020-06-22 DIAGNOSIS — E538 Deficiency of other specified B group vitamins: Secondary | ICD-10-CM | POA: Diagnosis not present

## 2020-06-29 DIAGNOSIS — Z1212 Encounter for screening for malignant neoplasm of rectum: Secondary | ICD-10-CM | POA: Diagnosis not present

## 2020-06-29 DIAGNOSIS — E039 Hypothyroidism, unspecified: Secondary | ICD-10-CM | POA: Diagnosis not present

## 2020-06-29 DIAGNOSIS — E611 Iron deficiency: Secondary | ICD-10-CM | POA: Diagnosis not present

## 2020-06-29 DIAGNOSIS — I130 Hypertensive heart and chronic kidney disease with heart failure and stage 1 through stage 4 chronic kidney disease, or unspecified chronic kidney disease: Secondary | ICD-10-CM | POA: Diagnosis not present

## 2020-06-29 DIAGNOSIS — I10 Essential (primary) hypertension: Secondary | ICD-10-CM | POA: Diagnosis not present

## 2020-06-29 DIAGNOSIS — Z1331 Encounter for screening for depression: Secondary | ICD-10-CM | POA: Diagnosis not present

## 2020-06-29 DIAGNOSIS — E785 Hyperlipidemia, unspecified: Secondary | ICD-10-CM | POA: Diagnosis not present

## 2020-06-29 DIAGNOSIS — I5030 Unspecified diastolic (congestive) heart failure: Secondary | ICD-10-CM | POA: Diagnosis not present

## 2020-06-29 DIAGNOSIS — Z0000 Encounter for general adult medical examination without abnormal findings: Secondary | ICD-10-CM | POA: Diagnosis not present

## 2020-06-29 DIAGNOSIS — N1832 Chronic kidney disease, stage 3b: Secondary | ICD-10-CM | POA: Diagnosis not present

## 2020-06-29 DIAGNOSIS — D649 Anemia, unspecified: Secondary | ICD-10-CM | POA: Diagnosis not present

## 2020-06-29 DIAGNOSIS — Z1339 Encounter for screening examination for other mental health and behavioral disorders: Secondary | ICD-10-CM | POA: Diagnosis not present

## 2020-06-29 DIAGNOSIS — R82998 Other abnormal findings in urine: Secondary | ICD-10-CM | POA: Diagnosis not present

## 2020-06-29 DIAGNOSIS — M8580 Other specified disorders of bone density and structure, unspecified site: Secondary | ICD-10-CM | POA: Diagnosis not present

## 2020-06-29 DIAGNOSIS — D692 Other nonthrombocytopenic purpura: Secondary | ICD-10-CM | POA: Diagnosis not present

## 2020-10-17 DIAGNOSIS — N1832 Chronic kidney disease, stage 3b: Secondary | ICD-10-CM | POA: Diagnosis not present

## 2020-10-17 DIAGNOSIS — Z1152 Encounter for screening for COVID-19: Secondary | ICD-10-CM | POA: Diagnosis not present

## 2020-10-17 DIAGNOSIS — D649 Anemia, unspecified: Secondary | ICD-10-CM | POA: Diagnosis not present

## 2020-10-17 DIAGNOSIS — J189 Pneumonia, unspecified organism: Secondary | ICD-10-CM | POA: Diagnosis not present

## 2020-10-17 DIAGNOSIS — R0602 Shortness of breath: Secondary | ICD-10-CM | POA: Diagnosis not present

## 2020-10-17 DIAGNOSIS — R509 Fever, unspecified: Secondary | ICD-10-CM | POA: Diagnosis not present

## 2020-10-17 DIAGNOSIS — I130 Hypertensive heart and chronic kidney disease with heart failure and stage 1 through stage 4 chronic kidney disease, or unspecified chronic kidney disease: Secondary | ICD-10-CM | POA: Diagnosis not present

## 2020-11-03 IMAGING — CR CHEST - 2 VIEW
2 series · 2 of 2 positions shown · non-contrast
Comparison: Earlier study of 10/28/2018 at 0841 hours

CLINICAL DATA: Shortness of breath, wheezing today, CHF,
hypertension, former smoker

EXAM:
CHEST - 2 VIEW

[chest lat]
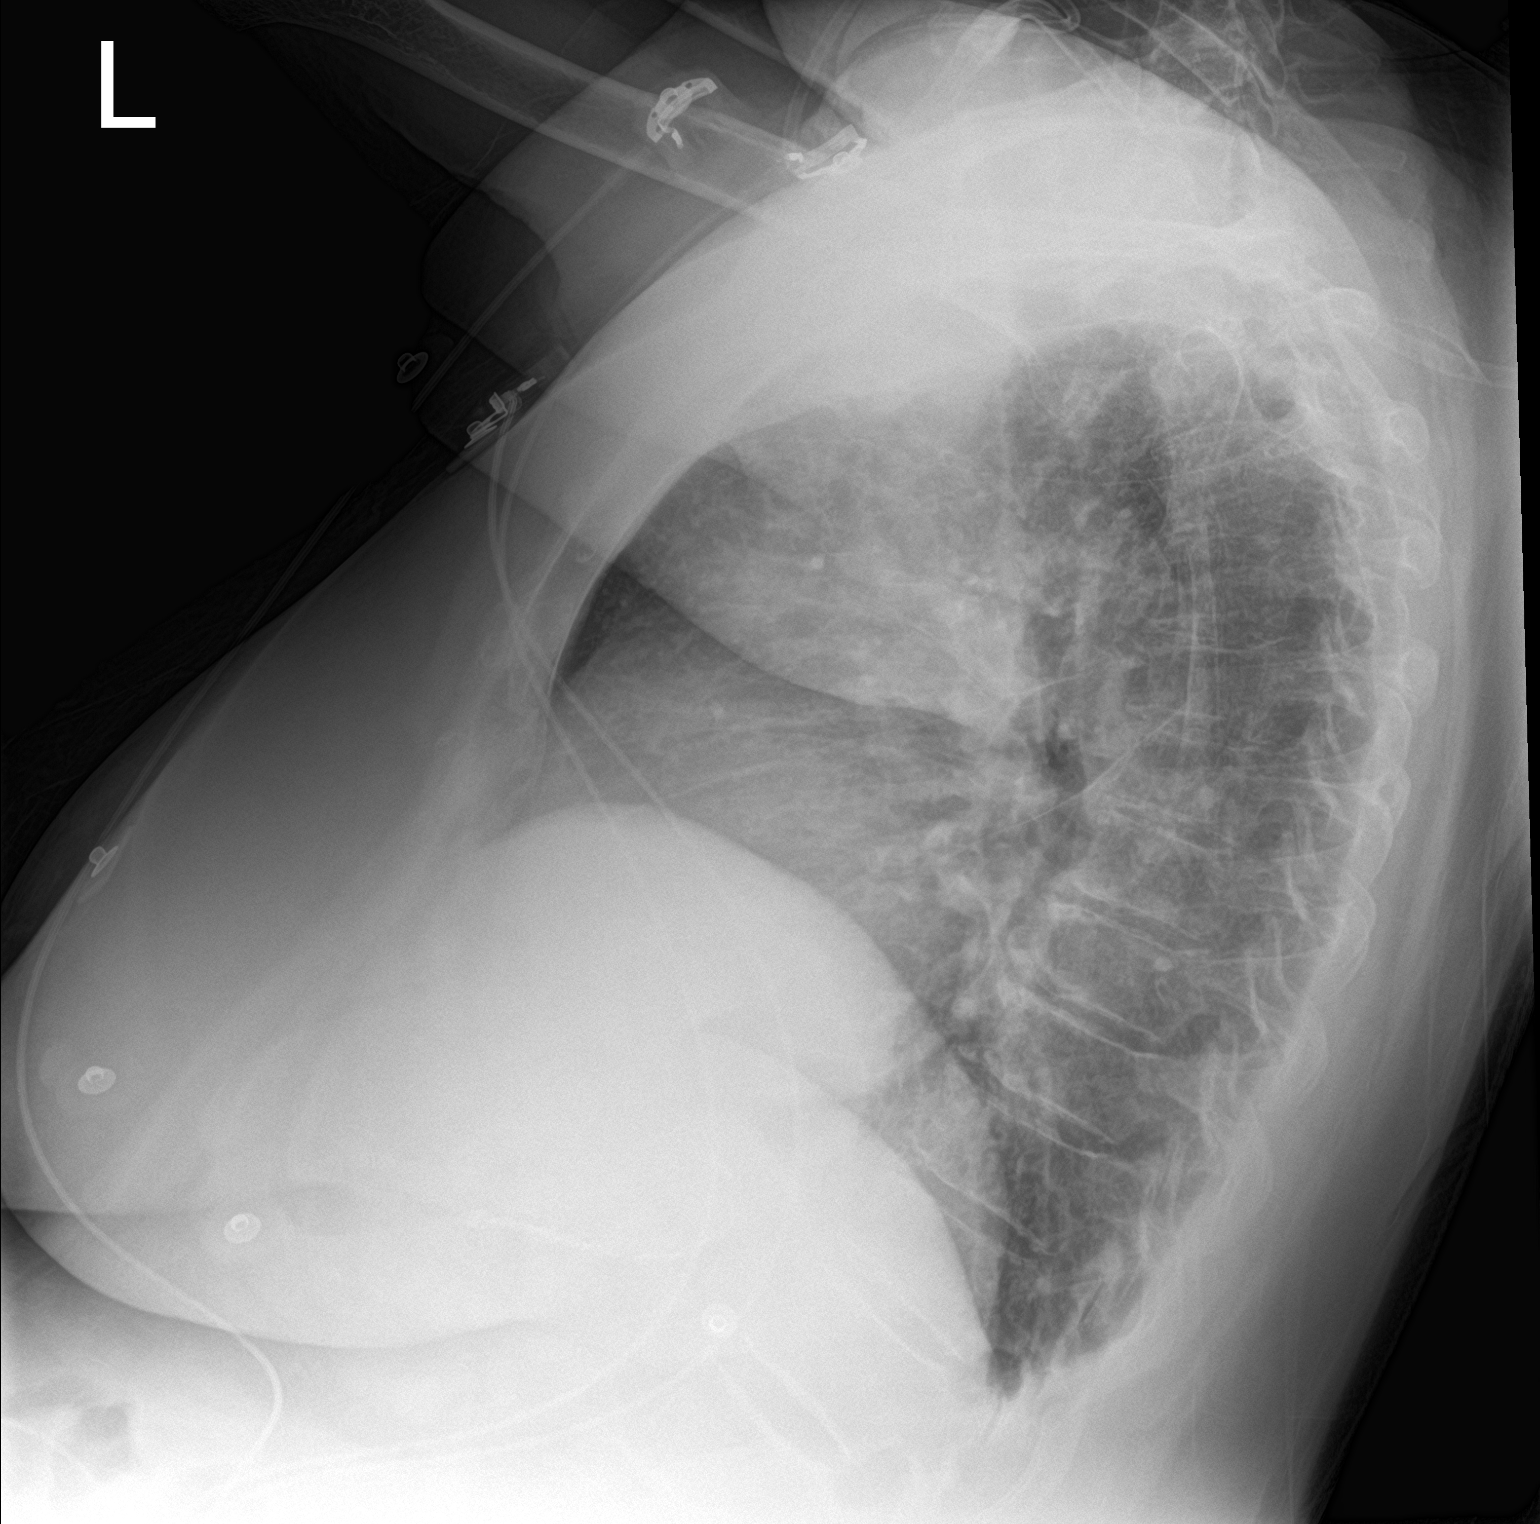

[chest ap]
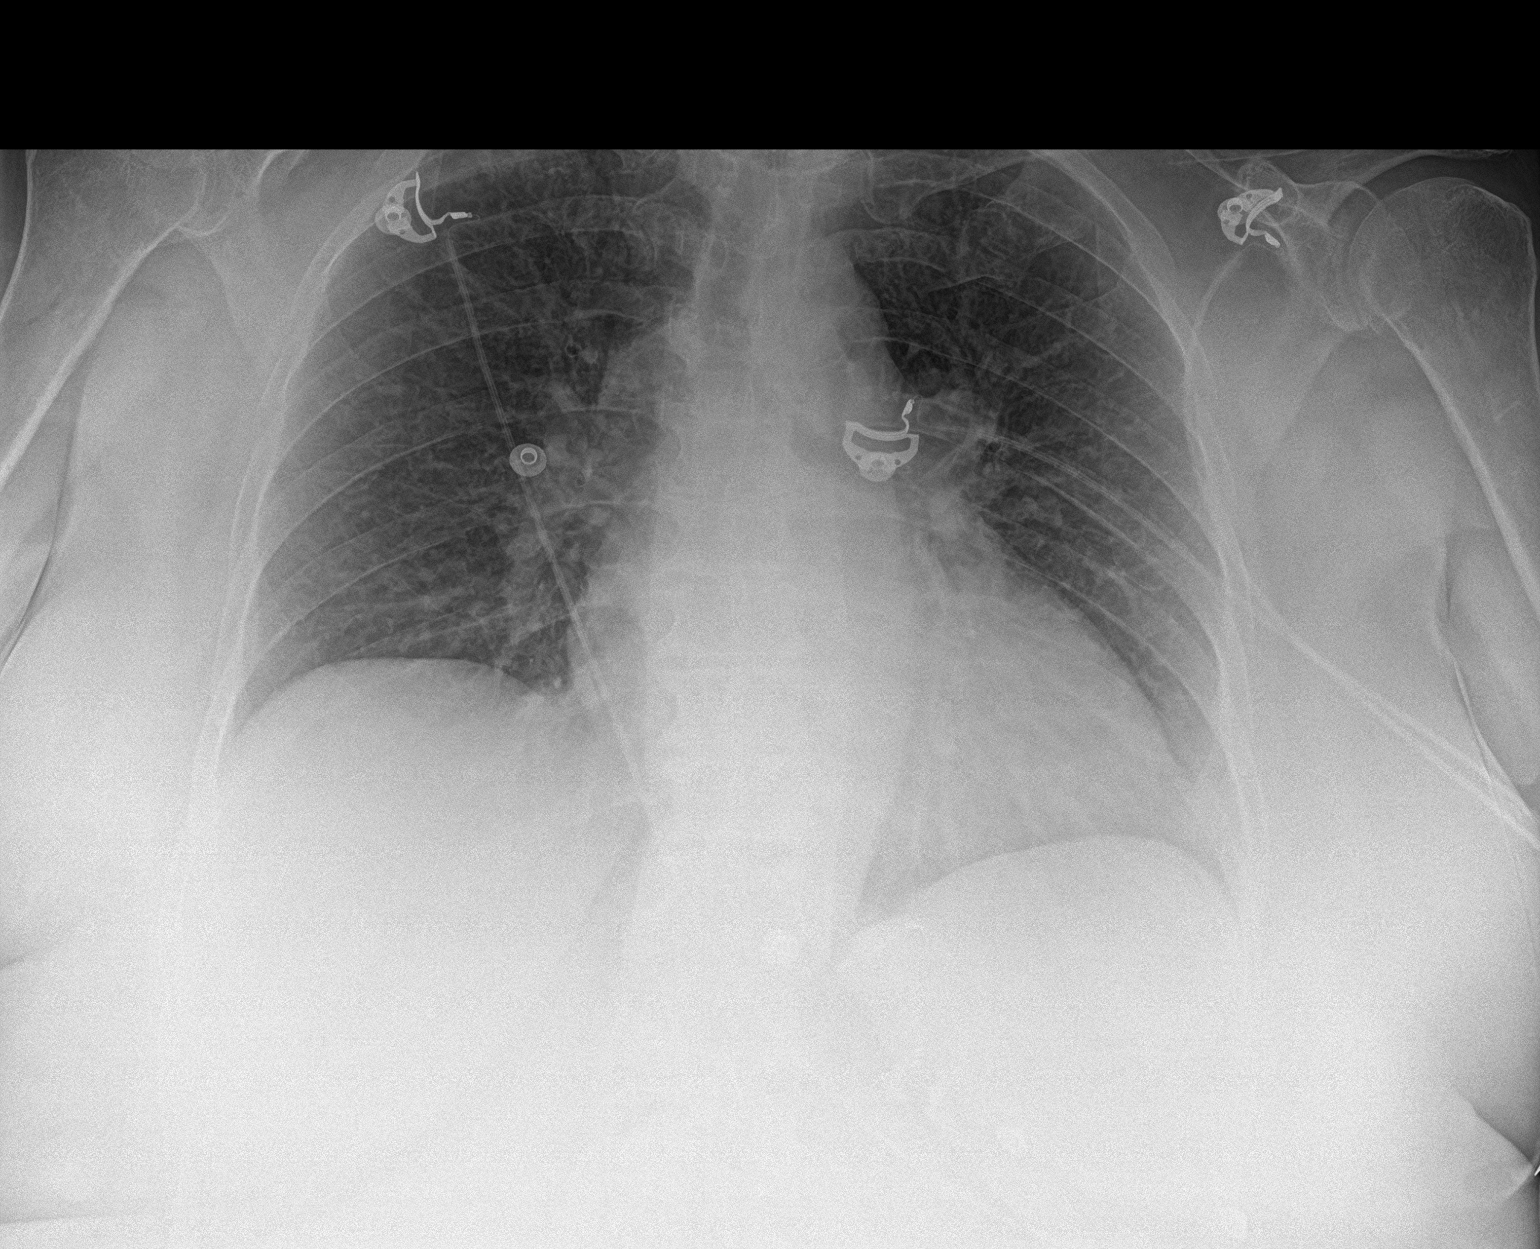

[2 of 2 positions shown; findings below may reference images not displayed]

FINDINGS: Probable enlargement of cardiac silhouette.

Mediastinal contours and pulmonary vascularity normal.

Minimal peribronchial thickening.

No infiltrate, pleural effusion or pneumothorax.

Bones demineralized.
IMPRESSION: Minimal enlargement of cardiac silhouette and bronchitic changes
without acute infiltrate.

## 2020-11-03 IMAGING — DX PORTABLE CHEST - 1 VIEW
1 series · 1 of 1 positions shown · non-contrast
Comparison: Portable exam 3751 hours without priors for comparison

CLINICAL DATA: Shortness of breath, leg swelling and chest
tightness since last night, labored breathing, tachypnea, history
CHF, hypertension, former smoker

EXAM:
PORTABLE CHEST 1 VIEW

[chest ap]
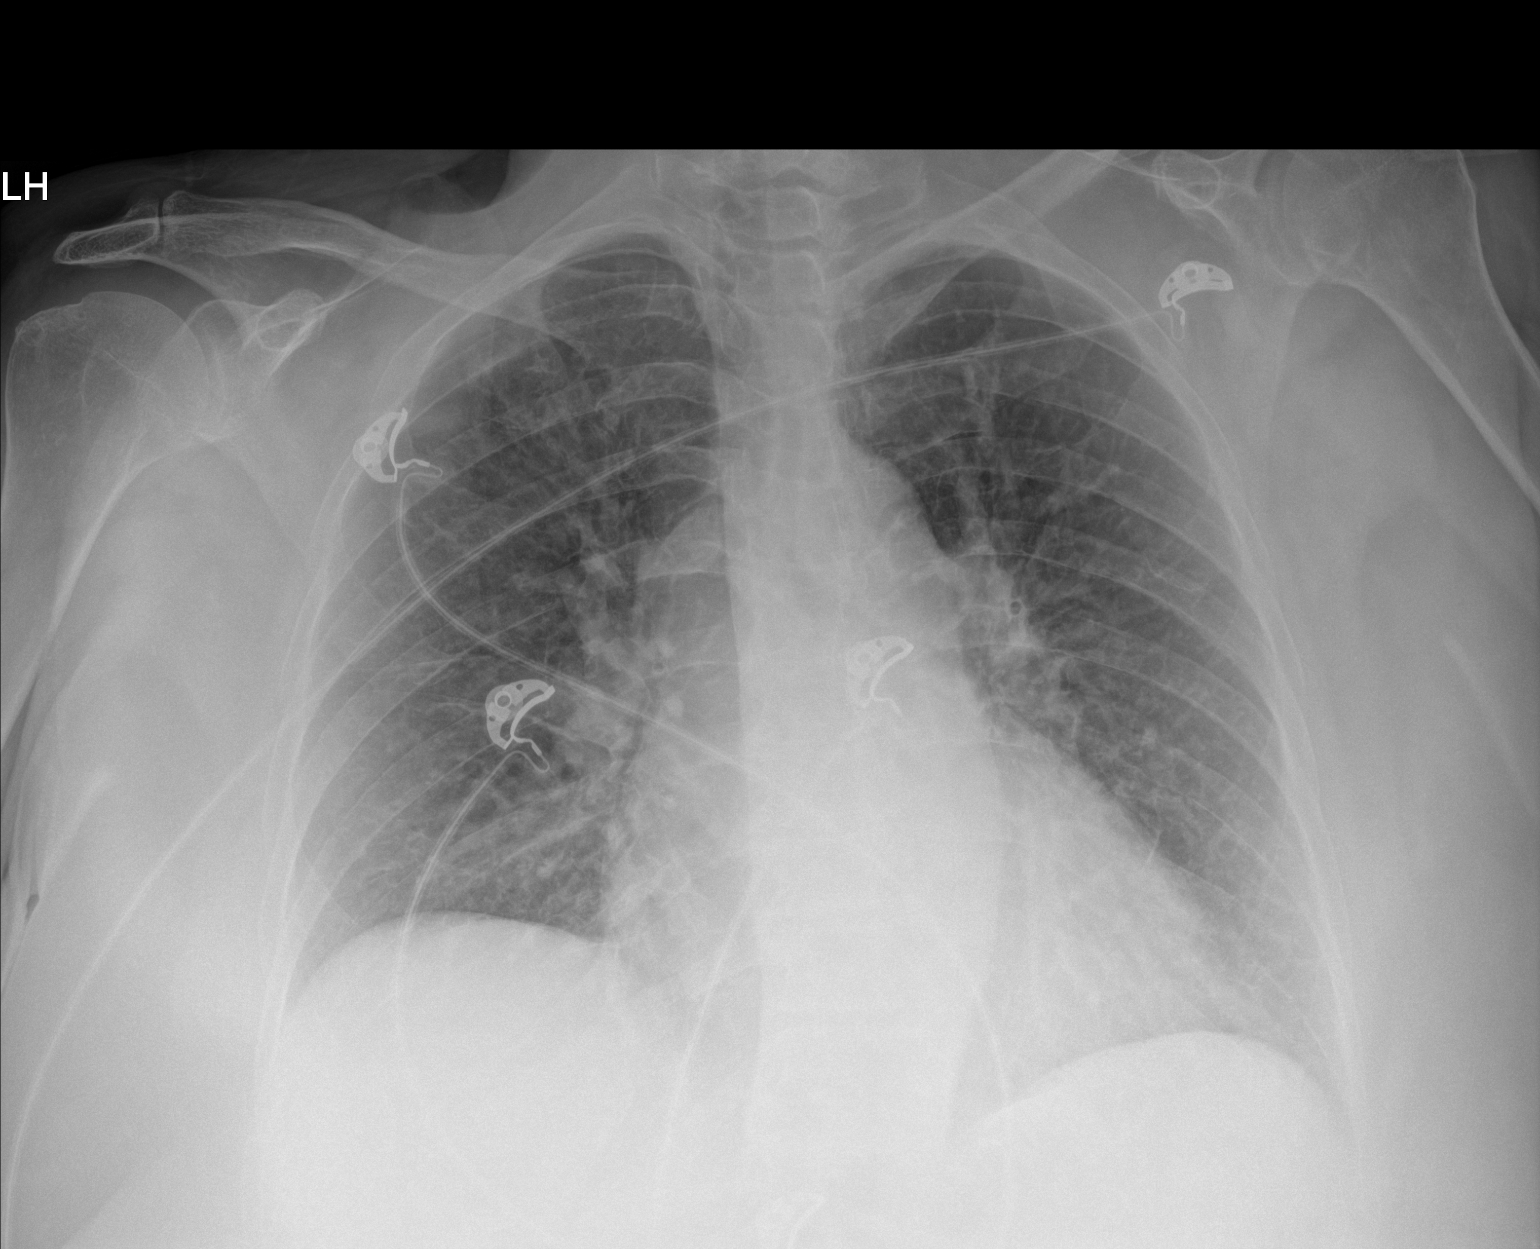

[1 of 1 positions shown; findings below may reference images not displayed]

FINDINGS: Enlargement of cardiac silhouette with pulmonary vascular
congestion.

Mediastinal contours normal.

Minimal central peribronchial thickening.

Lungs clear.

No infiltrate, pleural effusion or pneumothorax.

Bones demineralized.
IMPRESSION: Enlargement of cardiac silhouette with pulmonary vascular
congestion.

Minimal bronchitic changes without acute infiltrate.

## 2020-11-03 IMAGING — US ULTRASOUND ABDOMEN LIMITED
1 series · 14 of 25 positions shown · non-contrast
Comparison: None.

CLINICAL DATA: Acute right upper quadrant abdominal pain.

EXAM:
ULTRASOUND ABDOMEN LIMITED RIGHT UPPER QUADRANT

[Series 1: ultrasound abdomen limited · 0.26mm/px · 14 of 56 slices shown]
[im 1/56]
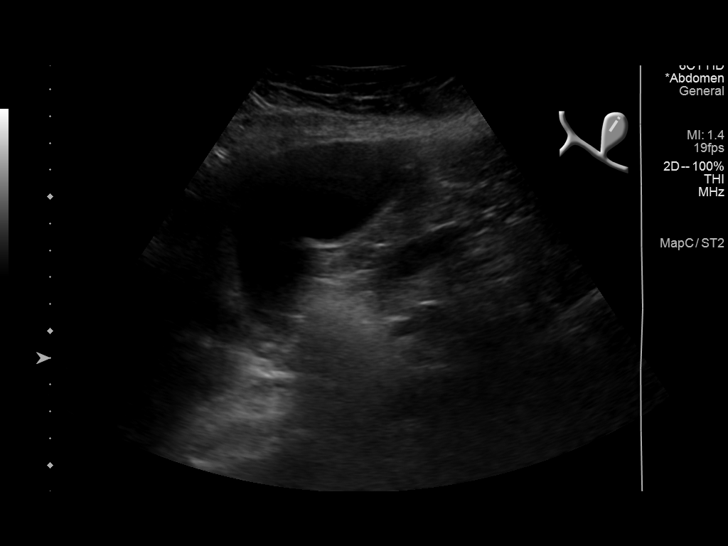
[im 5/56]
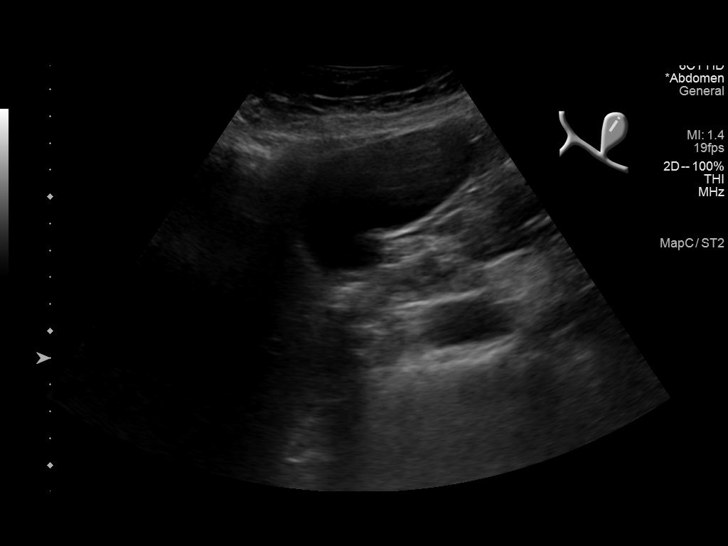
[im 10/56]
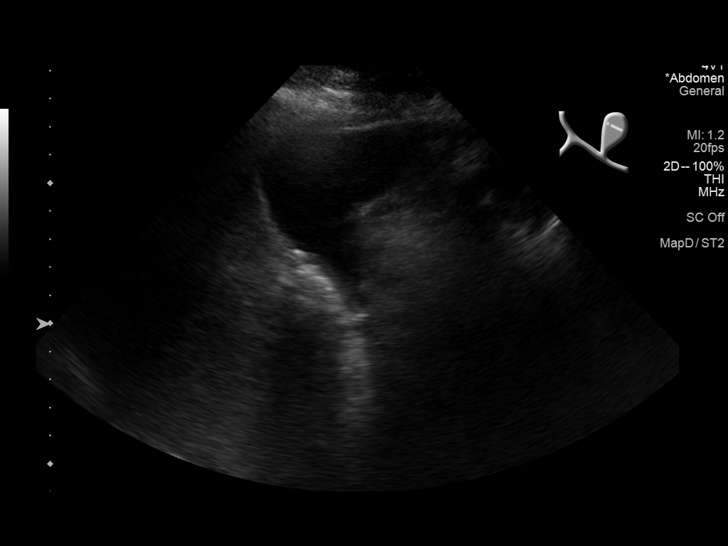
[im 14/56]
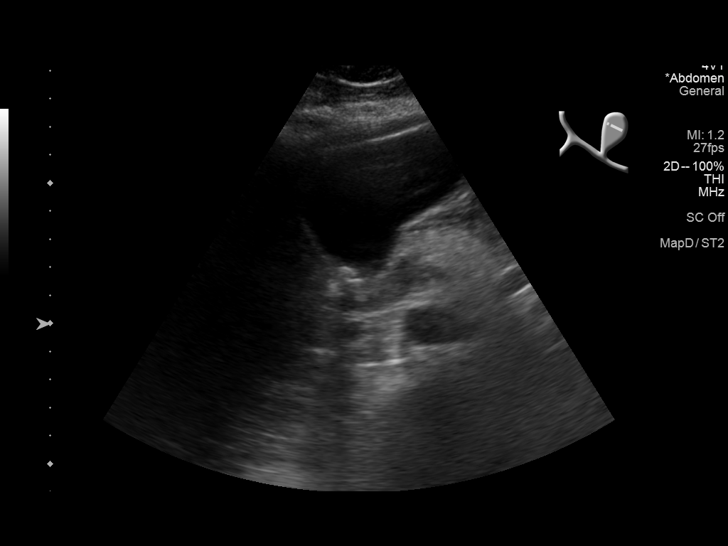
[im 19/56]
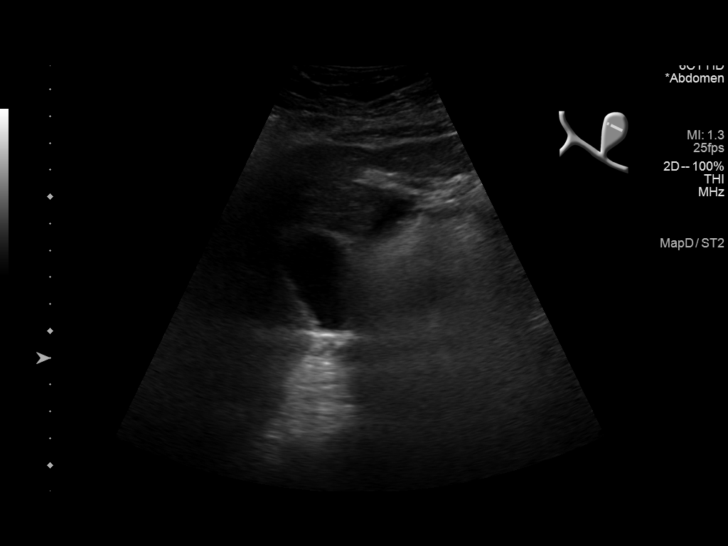
[im 21/56]
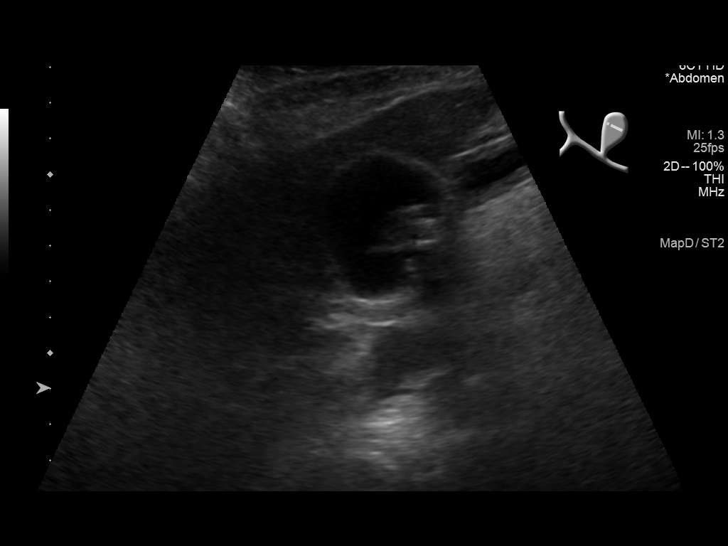
[im 26/56]
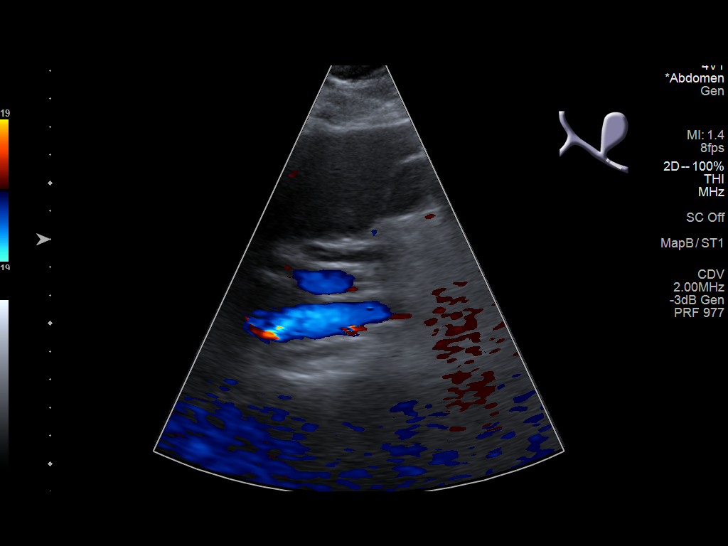
[im 30/56]
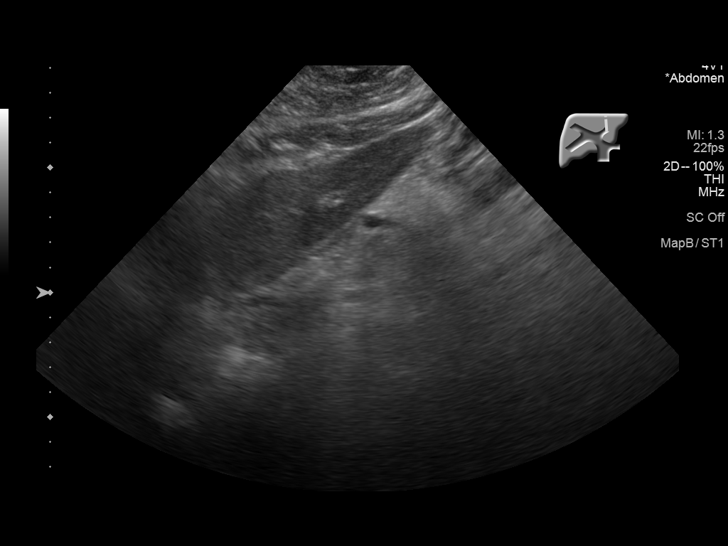
[im 35/56]
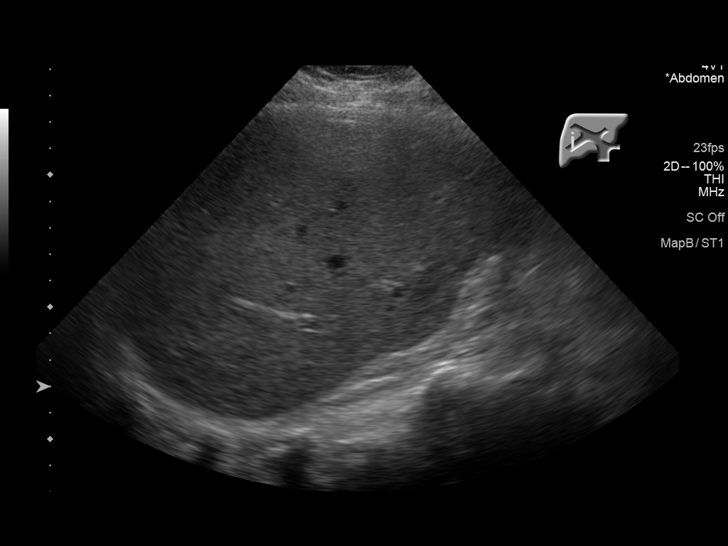
[im 37/56]
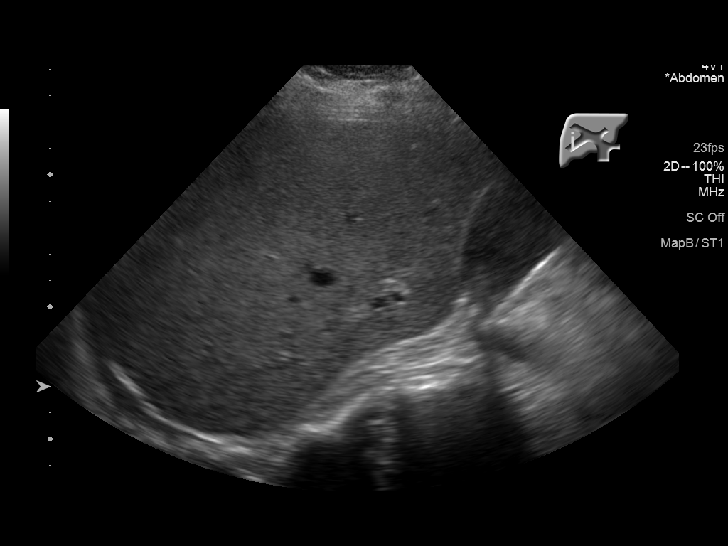
[im 42/56]
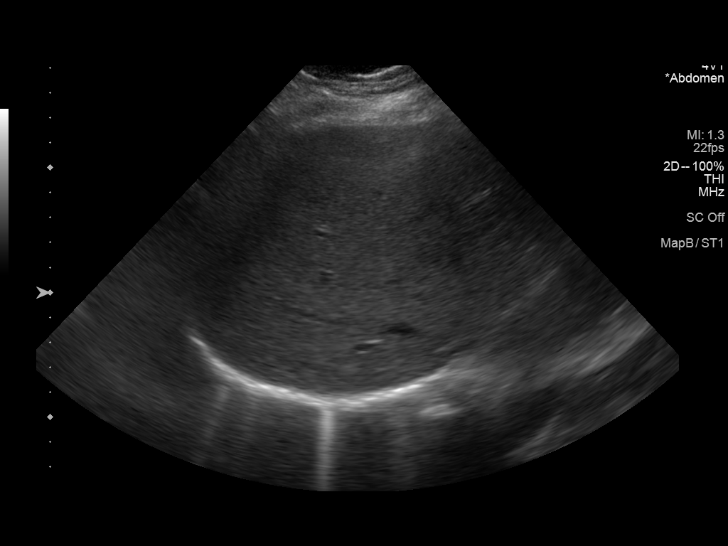
[im 46/56]
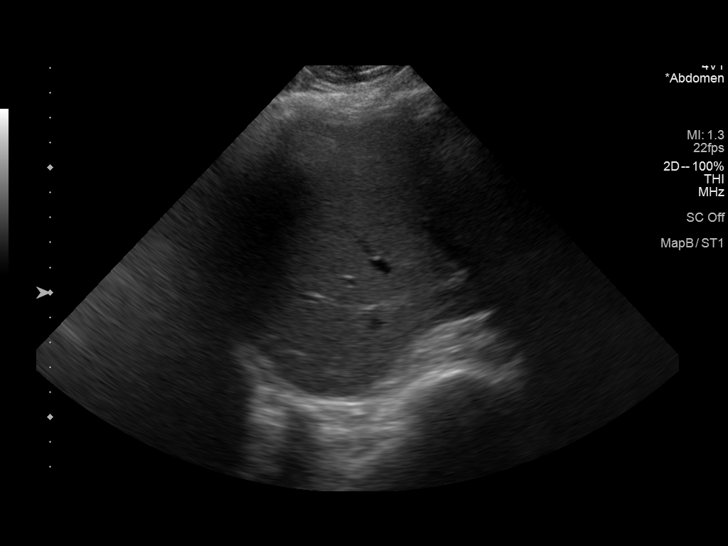
[im 51/56]
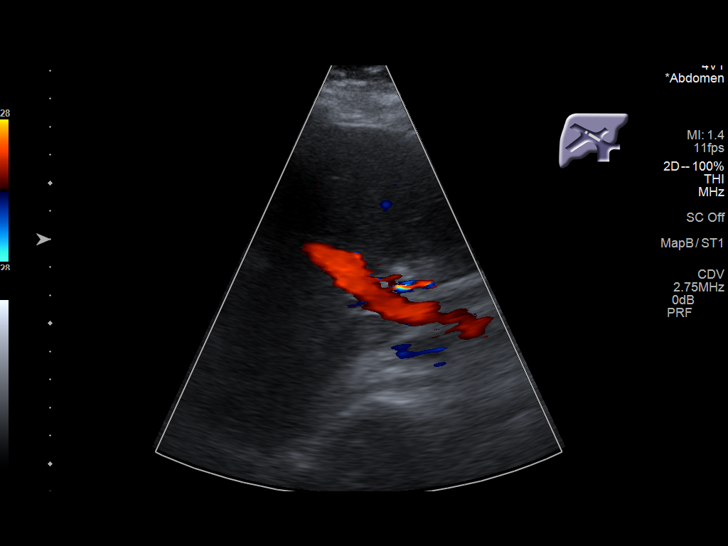
[im 56/56]
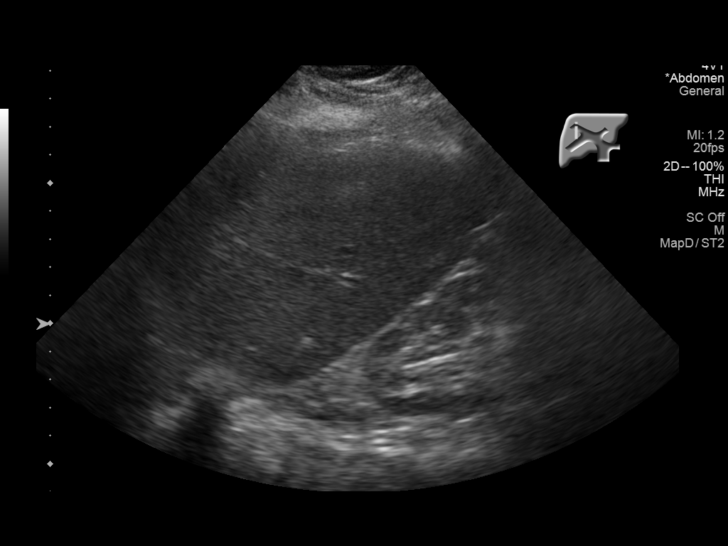

[14 of 25 positions shown; findings below may reference images not displayed]

FINDINGS: Gallbladder:

Minimal cholelithiasis is noted without gallbladder wall thickening
or pericholecystic fluid. No sonographic Murphy's sign is noted.

Common bile duct:

Diameter: 5 mm which is within normal limits.

Liver:

No focal lesion identified. Within normal limits in parenchymal
echogenicity. Portal vein is patent on color Doppler imaging with
normal direction of blood flow towards the liver.
IMPRESSION: Minimal cholelithiasis without evidence of cholecystitis. No other
abnormality seen in the right upper quadrant of the abdomen.

## 2020-11-05 IMAGING — CR CHEST - 2 VIEW
1 series · 2 of 2 positions shown · non-contrast
Comparison: October 28, 2018

CLINICAL DATA: Shortness of breath and lower extremity edema

EXAM:
CHEST - 2 VIEW

[Series 1: dg chest 2 view · 0.14mm/px · 2 of 2 slices shown]
[im 1/2]
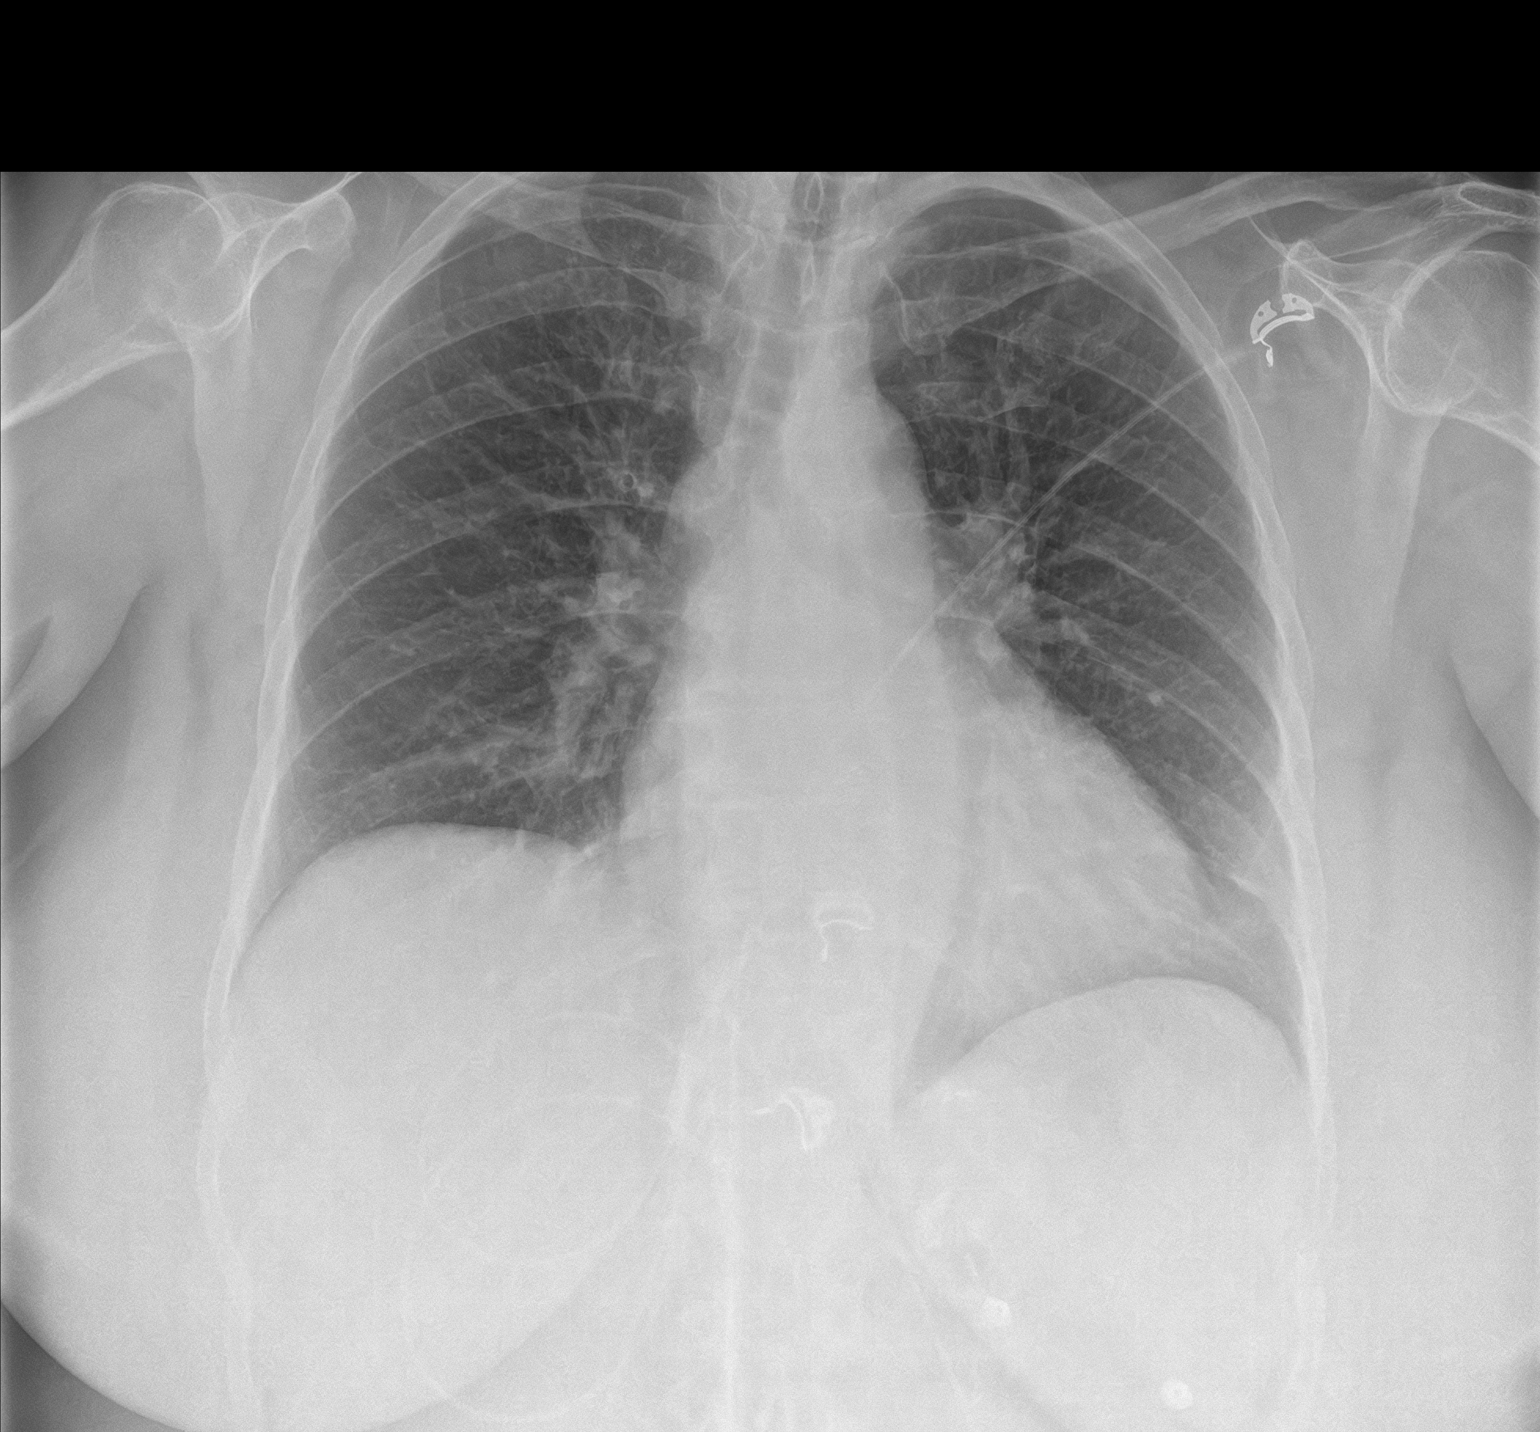
[im 2/2]
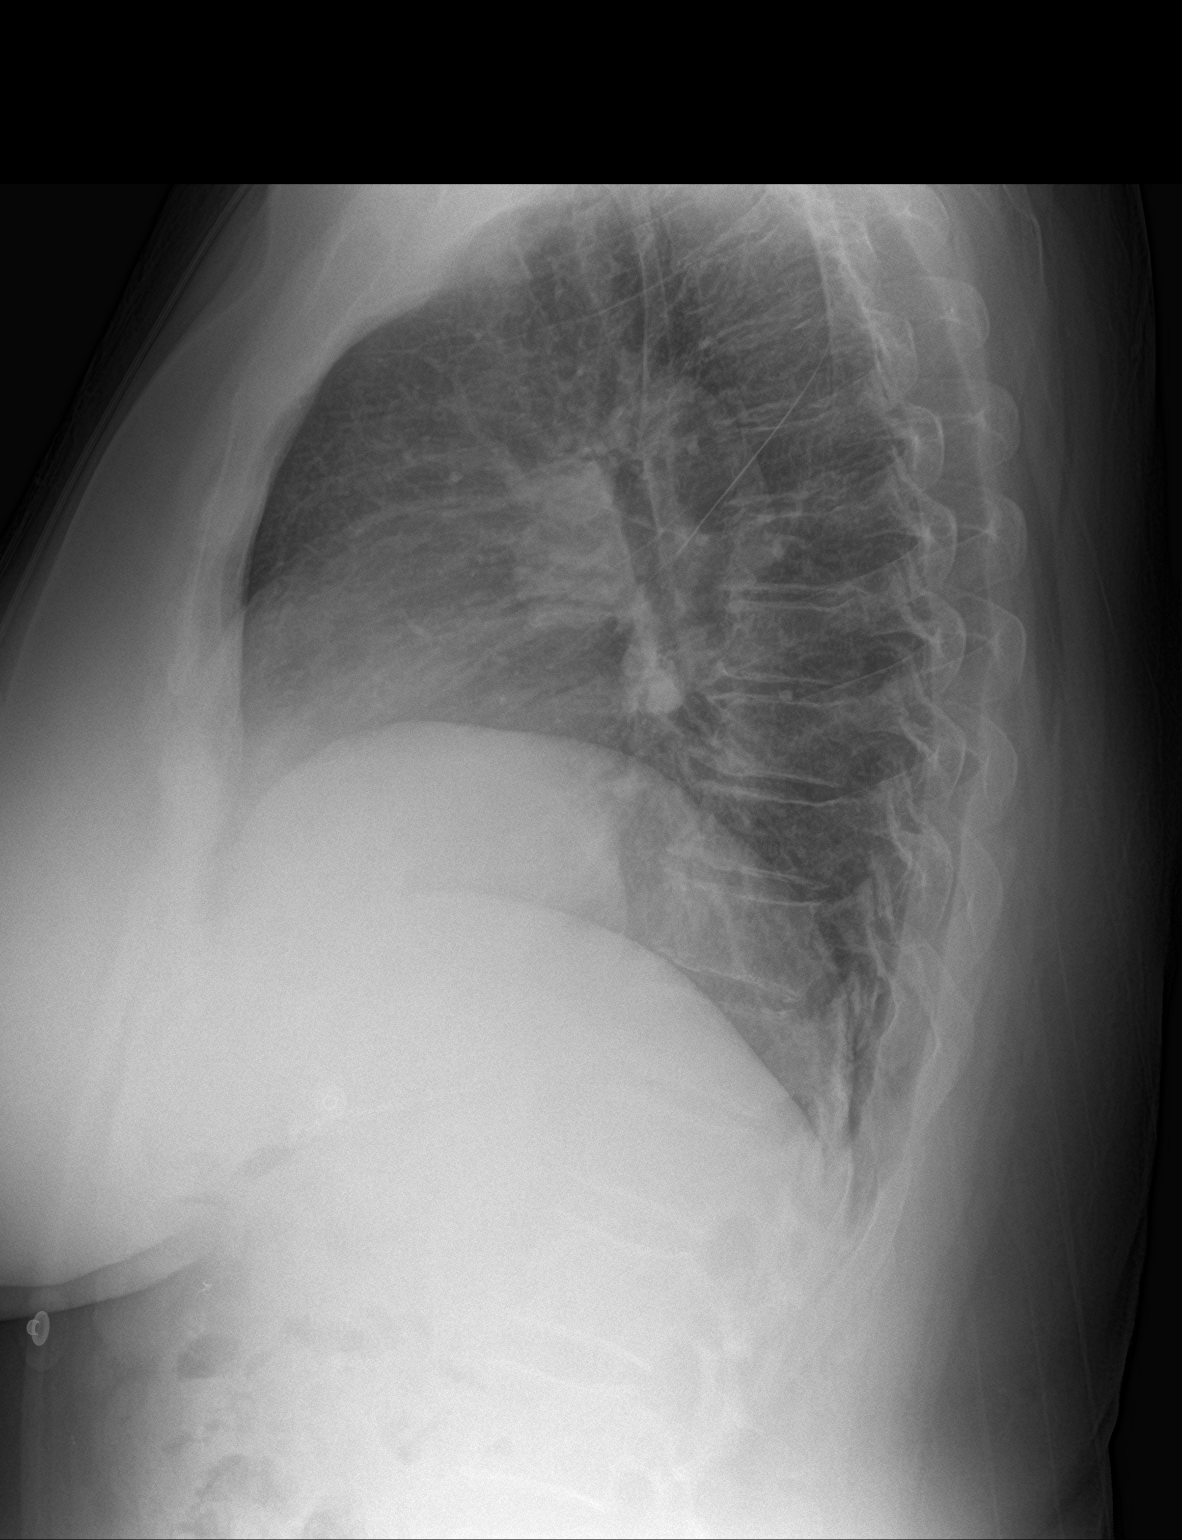

[2 of 2 positions shown; findings below may reference images not displayed]

FINDINGS: There is minimal bibasilar atelectasis. There is no edema or
consolidation. Heart size and pulmonary vascularity are normal. No
adenopathy. No bone lesions.
IMPRESSION: Minimal bibasilar atelectasis. No edema or consolidation. Cardiac
silhouette within normal limits.

## 2021-04-12 ENCOUNTER — Ambulatory Visit (INDEPENDENT_AMBULATORY_CARE_PROVIDER_SITE_OTHER): Payer: Medicare Other

## 2021-04-12 ENCOUNTER — Encounter: Payer: Self-pay | Admitting: Physician Assistant

## 2021-04-12 ENCOUNTER — Ambulatory Visit (INDEPENDENT_AMBULATORY_CARE_PROVIDER_SITE_OTHER): Payer: Medicare Other | Admitting: Physician Assistant

## 2021-04-12 ENCOUNTER — Other Ambulatory Visit: Payer: Self-pay

## 2021-04-12 DIAGNOSIS — M25511 Pain in right shoulder: Secondary | ICD-10-CM

## 2021-04-12 MED ORDER — LIDOCAINE HCL 1 % IJ SOLN
3.0000 mL | INTRAMUSCULAR | Status: AC | PRN
  Administered 2021-04-12: 15:00:00 3 mL

## 2021-04-12 MED ORDER — METHYLPREDNISOLONE ACETATE 40 MG/ML IJ SUSP
40.0000 mg | INTRAMUSCULAR | Status: AC | PRN
  Administered 2021-04-12: 15:00:00 40 mg via INTRA_ARTICULAR

## 2021-04-12 NOTE — Progress Notes (Signed)
Office Visit Note   Patient: Lynn Joseph           Date of Birth: 30-Mar-1946           MRN: [id number] Visit Date: 04/12/2021              Requested by: Velna Hatchet, MD [address] [place],  [place] [address] PCP: Velna Hatchet, MD   Assessment & Plan: Visit Diagnoses:  1. Acute pain of right shoulder     Plan:  She given shoulder exercise handouts and Ther[employer]. Will see her back in 2 weeks to see how she responded to the injection. Questions encouraged and answered.   Follow-Up Instructions: Return in about 2 weeks (around 04/26/2021).   Orders:  Orders Placed This Encounter  Procedures   Large Joint Inj   XR Shoulder Right   No orders of the defined types were placed in this encounter.     Procedures: Large Joint Inj: R subacromial bursa on 04/12/2021 2:55 PM Indications: pain Details: 22 G 1.5 in needle, lateral approach  Arthrogram: No  Medications: 3 mL lidocaine 1 %; 40 mg methylPREDNISolone acetate 40 MG/ML Outcome: tolerated well, no immediate complications Procedure, treatment alternatives, risks and benefits explained, specific risks discussed. Consent was given by the patient. Immediately prior to procedure a time out was called to verify the correct patient, procedure, equipment, support staff and site/side marked as required. Patient was prepped and draped in the usual sterile fashion.      Clinical Data: No additional findings.   Subjective: Chief Complaint  Patient presents with   Right Shoulder - Pain    HPI  Review of Systems   Objective: Vital Signs: There were no vitals taken for this visit.  Physical Exam Constitutional:      Appearance: She is not ill-appearing or diaphoretic.  Pulmonary:     Effort: Pulmonary effort is normal.  Neurological:     Mental Status: She is alert and oriented to person, place, and time.  Psychiatric:        Mood and Affect: Mood normal.    Ortho Exam Bilateral shoulders 5/5  strength with external and internal rotation against resistance.  Empty can test is negative bilaterally.  Positive impingement right shoulder.  Liftoff test is positive on the right negative on the left. Specialty Comments:  No specialty comments available.  Imaging: XR Shoulder Right  Result Date: 04/12/2021 Right shoulder 3 views: No acute fracture.  Shoulders well located.  Glenohumeral joints well-maintained.  Shoulders without any bony abnormalities or acute findings.    PMFS History: Patient Active Problem List   Diagnosis Date Noted   Intestinal malabsorption 02/24/2020   Hypothyroidism 02/24/2020   Benign essential hypertension 02/24/2020   Anemia of chronic disease 02/24/2020   Adult health examination declined 02/24/2020   Primary osteoarthritis of left knee 09/23/2019   Primary osteoarthritis of right knee 09/23/2019   SOB (shortness of breath) 10/28/2018   Dyspnea 02/15/2016   OSA (obstructive sleep apnea) 03/07/2015   UARS (upper airway resistance syndrome) 03/07/2015   Snoring 01/31/2015   Sleep apnea 01/31/2015   Hypoxemia 01/31/2015   Obesity 01/31/2015   Ataxia 01/31/2015   Past Medical History:  Diagnosis Date   Anemia of chronic disease    CHF (congestive heart failure) (HCC)    Chronic diarrhea    Glaucoma    HTN (hypertension)    Hypertension    Hypothyroid    Obesity    Skin cancer  Family History  Problem Relation Age of Onset   Hypertension Mother    Thyroid disease Mother    Stroke Mother    Kidney disease Father    Diabetes Father     Past Surgical History:  Procedure Laterality Date   GASTRIC BYPASS     GASTRIC RESTRICTION SURGERY     TUBAL LIGATION     Social History   Occupational History   Not on file  Tobacco Use   Smoking status: Never   Smokeless tobacco: Never  Substance and Sexual Activity   Alcohol use: Never    Comment: 1 glass of wine weekly   Drug use: No   Sexual activity: Not on file

## 2021-05-01 ENCOUNTER — Ambulatory Visit: Payer: Medicare Other | Admitting: Physician Assistant

## 2021-06-19 ENCOUNTER — Other Ambulatory Visit: Payer: Self-pay | Admitting: Internal Medicine

## 2021-06-19 DIAGNOSIS — Z1231 Encounter for screening mammogram for malignant neoplasm of breast: Secondary | ICD-10-CM

## 2021-06-27 ENCOUNTER — Ambulatory Visit
Admission: RE | Admit: 2021-06-27 | Discharge: 2021-06-27 | Disposition: A | Discharge status: Home / Self Care | Payer: Medicare Other | Source: Ambulatory Visit | Attending: Internal Medicine | Admitting: Internal Medicine

## 2021-06-27 DIAGNOSIS — Z1231 Encounter for screening mammogram for malignant neoplasm of breast: Secondary | ICD-10-CM

## 2021-06-28 ENCOUNTER — Ambulatory Visit: Payer: Medicare Other

## 2021-07-04 DIAGNOSIS — I10 Essential (primary) hypertension: Secondary | ICD-10-CM | POA: Diagnosis not present

## 2021-07-04 DIAGNOSIS — E538 Deficiency of other specified B group vitamins: Secondary | ICD-10-CM | POA: Diagnosis not present

## 2021-07-04 DIAGNOSIS — E611 Iron deficiency: Secondary | ICD-10-CM | POA: Diagnosis not present

## 2021-07-04 DIAGNOSIS — E039 Hypothyroidism, unspecified: Secondary | ICD-10-CM | POA: Diagnosis not present

## 2021-07-04 DIAGNOSIS — E785 Hyperlipidemia, unspecified: Secondary | ICD-10-CM | POA: Diagnosis not present

## 2021-07-11 DIAGNOSIS — N1832 Chronic kidney disease, stage 3b: Secondary | ICD-10-CM | POA: Diagnosis not present

## 2021-07-11 DIAGNOSIS — R82998 Other abnormal findings in urine: Secondary | ICD-10-CM | POA: Diagnosis not present

## 2021-07-11 DIAGNOSIS — D692 Other nonthrombocytopenic purpura: Secondary | ICD-10-CM | POA: Diagnosis not present

## 2021-07-11 DIAGNOSIS — D649 Anemia, unspecified: Secondary | ICD-10-CM | POA: Diagnosis not present

## 2021-07-11 DIAGNOSIS — M5430 Sciatica, unspecified side: Secondary | ICD-10-CM | POA: Diagnosis not present

## 2021-07-11 DIAGNOSIS — I5030 Unspecified diastolic (congestive) heart failure: Secondary | ICD-10-CM | POA: Diagnosis not present

## 2021-07-11 DIAGNOSIS — Z1339 Encounter for screening examination for other mental health and behavioral disorders: Secondary | ICD-10-CM | POA: Diagnosis not present

## 2021-07-11 DIAGNOSIS — M8580 Other specified disorders of bone density and structure, unspecified site: Secondary | ICD-10-CM | POA: Diagnosis not present

## 2021-07-11 DIAGNOSIS — Z1331 Encounter for screening for depression: Secondary | ICD-10-CM | POA: Diagnosis not present

## 2021-07-11 DIAGNOSIS — Z0000 Encounter for general adult medical examination without abnormal findings: Secondary | ICD-10-CM | POA: Diagnosis not present

## 2021-07-11 DIAGNOSIS — E039 Hypothyroidism, unspecified: Secondary | ICD-10-CM | POA: Diagnosis not present

## 2021-07-11 DIAGNOSIS — I10 Essential (primary) hypertension: Secondary | ICD-10-CM | POA: Diagnosis not present

## 2021-10-04 DIAGNOSIS — R0602 Shortness of breath: Secondary | ICD-10-CM | POA: Diagnosis not present

## 2021-10-04 DIAGNOSIS — I130 Hypertensive heart and chronic kidney disease with heart failure and stage 1 through stage 4 chronic kidney disease, or unspecified chronic kidney disease: Secondary | ICD-10-CM | POA: Diagnosis not present

## 2021-10-04 DIAGNOSIS — N1832 Chronic kidney disease, stage 3b: Secondary | ICD-10-CM | POA: Diagnosis not present

## 2021-10-04 DIAGNOSIS — R600 Localized edema: Secondary | ICD-10-CM | POA: Diagnosis not present

## 2021-12-22 ENCOUNTER — Ambulatory Visit (INDEPENDENT_AMBULATORY_CARE_PROVIDER_SITE_OTHER): Payer: Medicare Other

## 2021-12-22 ENCOUNTER — Ambulatory Visit: Payer: Medicare Other | Attending: Cardiovascular Disease | Admitting: Cardiovascular Disease

## 2021-12-22 ENCOUNTER — Encounter: Payer: Self-pay | Admitting: Cardiovascular Disease

## 2021-12-22 ENCOUNTER — Other Ambulatory Visit
Admission: RE | Admit: 2021-12-22 | Discharge: 2021-12-22 | Disposition: A | Discharge status: Home / Self Care | Payer: Medicare Other | Source: Ambulatory Visit | Attending: Cardiovascular Disease | Admitting: Cardiovascular Disease

## 2021-12-22 VITALS — BP 160/70 | HR 62 | Ht 65.5 in | Wt 240.5 lb

## 2021-12-22 DIAGNOSIS — E782 Mixed hyperlipidemia: Secondary | ICD-10-CM | POA: Diagnosis not present

## 2021-12-22 DIAGNOSIS — N289 Disorder of kidney and ureter, unspecified: Secondary | ICD-10-CM | POA: Diagnosis not present

## 2021-12-22 DIAGNOSIS — R079 Chest pain, unspecified: Secondary | ICD-10-CM

## 2021-12-22 DIAGNOSIS — R002 Palpitations: Secondary | ICD-10-CM | POA: Diagnosis not present

## 2021-12-22 DIAGNOSIS — E669 Obesity, unspecified: Secondary | ICD-10-CM

## 2021-12-22 LAB — BASIC METABOLIC PANEL
Anion gap: 2 — ABNORMAL LOW (ref 5–15)
BUN: 37 mg/dL — ABNORMAL HIGH (ref 8–23)
CO2: 25 mmol/L (ref 22–32)
Calcium: 9.1 mg/dL (ref 8.9–10.3)
Chloride: 117 mmol/L — ABNORMAL HIGH (ref 98–111)
Creatinine, Ser: 1.56 mg/dL — ABNORMAL HIGH (ref 0.44–1.00)
GFR, Estimated: 34 mL/min — ABNORMAL LOW (ref >60)
Glucose, Bld: 95 mg/dL (ref 70–99)
Potassium: 5.3 mmol/L — ABNORMAL HIGH (ref 3.5–5.1)
Sodium: 144 mmol/L (ref 135–145)

## 2021-12-22 NOTE — Progress Notes (Signed)
Cardiology Office Note  Date:  12/22/2021   ID:  Lynn Joseph, DOB 1946/02/03, MRN [id number]  PCP:  Velna Hatchet, MD   Chief Complaint  Patient presents with   New Patient (Initial Visit)    Ref by Dr. Ardeth Perfect for CHF. Patient c/o for the past year has felt some chest discomfort, fatigue, shortness of breath with little to no exertion and  bilateral LE edema. Medications reviewed by the patient verbally.     HPI:  Lynn Joseph is a 76 year old woman with past medical history of Hypertension Thyroid disorder Apnea CRI Presenting by referral from [facility] for CHF  She reports that she has been told by primary care that she has history of congestive heart failure This has been managed by taking HCTZ No recent hospitalizations Periodically with ankle swelling, minimal swelling on today's visit  Concerned about tachypalpitations Sometimes having chest discomfort  Blood pressure elevated today Does not check at home  Sedentary, no regular exercise program Runs dog rescue, active Can walk in stores  Echocardiogram July 2020 EF 55 to 60%  Echocardiogram June 2017 EF 60 to 65%  U/s in 10/21 Right Carotid: Velocities in the right ICA are consistent with a 1-39% stenosis.  Left Carotid: Velocities in the left ICA are consistent with a 40-59% stenosis.  EKG personally reviewed by myself on todays visit Normal sinus rhythm rate 62 bpm no significant ST-T wave changes  PMH:   has a past medical history of Anemia of chronic disease, CHF (congestive heart failure) ([facility]), Chronic diarrhea, Glaucoma, HTN (hypertension), Hypertension, Hypothyroid, Obesity, and Skin cancer.  PSH:    Past Surgical History:  Procedure Laterality Date   GASTRIC BYPASS     GASTRIC RESTRICTION SURGERY     TUBAL LIGATION      Current Outpatient Medications  Medication Sig Dispense Refill   Calcium Carbonate-Vitamin D (CALCIUM 600+D HIGH POTENCY) 600-400 MG-UNIT tablet Take 1  tablet by mouth daily.     gabapentin (NEURONTIN) 100 MG capsule Take 100 mg by mouth 3 times/day as needed-between meals & bedtime.     hydrALAZINE (APRESOLINE) 25 MG tablet Take 25 mg by mouth in the morning and at bedtime.     metoprolol (LOPRESSOR) 100 MG tablet Take 100 mg by mouth 2 (two) times daily.     Multiple Vitamin (MULTIVITAMIN WITH MINERALS) TABS tablet Take 1 tablet by mouth daily.     rosuvastatin (CRESTOR) 5 MG tablet Take 5 mg by mouth daily.     TIROSINT 125 MCG CAPS Take 125 mcg by mouth daily.     triamterene-hydrochlorothiazide (DYAZIDE) 37.5-25 MG capsule Take 1 capsule by mouth daily.     gabapentin (NEURONTIN) 300 MG capsule Take 1 capsule (300 mg total) by mouth at bedtime. (Patient not taking: Reported on 12/22/2021) 30 capsule 1   No current facility-administered medications for this visit.     Allergies:   No known allergies   Social History:  The patient  reports that she has never smoked. She has never used smokeless tobacco. She reports that she does not drink alcohol and does not use drugs.   Family History:   family history includes Diabetes in her father; Hypertension in her mother; Kidney disease in her father; Stroke in her mother; Thyroid disease in her mother.    Review of Systems: Review of Systems  Constitutional: Negative.   HENT: Negative.    Respiratory: Negative.    Cardiovascular:  Positive for chest pain and palpitations.  Gastrointestinal: Negative.   Musculoskeletal: Negative.   Neurological: Negative.   Psychiatric/Behavioral: Negative.    All other systems reviewed and are negative.   PHYSICAL EXAM: VS:  BP (!) 160/70 (BP Location: Right Arm, Patient Position: Sitting, Cuff Size: Large)   Pulse 62   Ht 5' 5.5" (1.664 m)   Wt 240 lb 8 oz (109.1 kg)   SpO2 98%   BMI 39.41 kg/m  , BMI Body mass index is 39.41 kg/m. GEN: Well nourished, well developed, in no acute distress HEENT: normal Neck: no JVD, carotid bruits, or  masses Cardiac: RRR; no murmurs, rubs, or gallops,no edema  Respiratory:  clear to auscultation bilaterally, normal work of breathing GI: soft, nontender, nondistended, + BS MS: no deformity or atrophy Skin: warm and dry, no rash Neuro:  Strength and sensation are intact Psych: euthymic mood, full affect   Recent Labs: No results found for requested labs within last 365 days.    Lipid Panel Lab Results  Component Value Date   CHOL 160 10/28/2018   HDL 55 10/28/2018   LDLCALC 92 10/28/2018   TRIG 64 10/28/2018      Wt Readings from Last 3 Encounters:  12/22/21 240 lb 8 oz (109.1 kg)  10/30/18 264 lb 15.9 oz (120.2 kg)  02/15/16 267 lb 9.6 oz (121.4 kg)      ASSESSMENT AND PLAN:  Problem List Items Addressed This Visit   None Visit Diagnoses     Chest pain of uncertain etiology    -  Primary   Relevant Orders   EKG 12-Lead   Palpitations       Relevant Orders   EKG 12-Lead   LONG TERM MONITOR (3-14 DAYS)   Renal insufficiency       Relevant Orders   Basic metabolic panel   Mixed hyperlipidemia       Obesity (BMI 35.0-39.9 without comorbidity)          Chest pain Etiology unclear, We have ordered BMP, if renal function will allow, will order cardiac CTA to rule out ischemia For elevated creatinine may need to change to Myoview Several echocardiograms in the past 60 years all with normal ejection fraction Updated echo could also be considered  Paroxysmal tachycardia/palpitations Etiology unclear, Zio monitor ordered for further evaluation Continue metoprolol  Hyperlipidemia Managed by primary care on Crestor 5 mg daily  Chronic renal sufficiency Lab work from primary care unavailable Repeat BMP ordered today before ordering cardiac CTA  Obesity We would encouraged continued exercise, careful diet management in an effort to lose weight.    Total encounter time more than 50 minutes  Greater than 50% was spent in counseling and coordination of care  with the patient    Signed, Esmond Plants, M.D., Ph.D. [place], [place]

## 2021-12-22 NOTE — Patient Instructions (Addendum)
Medication Instructions:  No changes  If you need a refill on your cardiac medications before your next appointment, please call your pharmacy.    Lab work: - Your physician recommends that you have lab work today:  [employer]  Nature conservation officer at [facility] 1st desk on the right to check in (REGISTRATION)  Lab hours: Monday- Friday (7:30 am- 5:30 pm)    Testing/Procedures: 1) Cardiac CT vs a Nuclear Stress Test pending the results of your lab work If renal function acceptable, we will order a cardiac CTA   2) Heart Monitor:  Length of Wear: 14 days  Your monitor will be mailed to your home address within 3-5 business days. However, if you have not received your monitor after 5 business days please send [name] a MyChart message or call the office at [phone], so we may follow up on this for you.   Your physician has recommended that you wear a Zio XT (heart)  monitor.   This monitor is a medical device that records the heart's electrical activity. Doctors most often use these monitors to diagnose arrhythmias. Arrhythmias are problems with the speed or rhythm of the heartbeat. The monitor is a small device applied to your chest. You can wear one while you do your normal daily activities. While wearing this monitor if you have any symptoms to push the button and record what you felt. Once you have worn this monitor for the period of time provider prescribed (Usually 14 days), you will return the monitor device in the postage paid box. Once it is returned they will download the data collected and provide [name] with a report which the provider will then review and we will call you with those results. Important tips:  Avoid showering during the first 24 hours of wearing the monitor. Avoid excessive sweating to help maximize wear time. Do not submerge the device, no hot tubs, and no swimming pools. Keep any lotions or oils away from the patch. After 24 hours you may shower with the patch on. Take  brief showers with your back facing the shower head.  Do not remove patch once it has been placed because that will interrupt data and decrease adhesive wear time. Push the button when you have any symptoms and write down what you were feeling. Once you have completed wearing your monitor, remove and place into box which has postage paid and place in your outgoing mailbox.  If for some reason you have misplaced your box then call our office and we can provide another box and/or mail it off for you.     Follow-Up: At [facility], you and your health needs are our priority.  As part of our continuing mission to provide you with exceptional heart care, we have created designated Provider Care Teams.  These Care Teams include your primary Cardiologist (physician) and Advanced Practice Providers (APPs -  Physician Assistants and Nurse Practitioners) who all work together to provide you with the care you need, when you need it.  You will need a follow up appointment as needed pending the results of your testing  Providers on your designated Care Team:   Murray Hodgkins, NP Christell Faith, PA-C Cadence Kathlen Mody, [place]  COVID-19 Vaccine Information can be found at: [email or web address] For questions related to vaccine distribution or appointments, please email [email or web address] or call [phone].

## 2021-12-26 DIAGNOSIS — R002 Palpitations: Secondary | ICD-10-CM

## 2021-12-29 ENCOUNTER — Telehealth: Payer: Self-pay | Admitting: Cardiovascular Disease

## 2021-12-29 DIAGNOSIS — I209 Angina pectoris, unspecified: Secondary | ICD-10-CM

## 2021-12-29 NOTE — Telephone Encounter (Signed)
Lynn Merritts, MD  [facility] Triage Renal function remains moderately elevated but stable  Recently seen in clinic for chest pain/angina and a  Given low GFR, would recommend Myoview,  will avoid cardiac CTA which has contrast   Mildly elevated potassium.  May be secondary to hemolysis but needs to be followed  Would avoid over diligence of foods high in potassium, bananas/citrus

## 2021-12-29 NOTE — Telephone Encounter (Signed)
Patient made aware of lab results and Dr. Donivan Scull recommendation.Patient is agreeable with the recommended myoview. Patient given verbal pre-test instructions. Adv the patient that a scheduler will contact her to schedule the stress test. Patient verbalized understanding to all of the info given.   [place]  Your caregiver has ordered a Stress Test with nuclear imaging. The purpose of this test is to evaluate the blood supply to your heart muscle. This procedure is referred to as a "Non-Invasive Stress Test." This is because other than having an IV started in your vein, nothing is inserted or "invades" your body. Cardiac stress tests are done to find areas of poor blood flow to the heart by determining the extent of coronary artery disease (CAD). Some patients exercise on a treadmill, which naturally increases the blood flow to your heart, while others who are  unable to walk on a treadmill due to physical limitations have a pharmacologic/chemical stress agent called Lexiscan . This medicine will mimic walking on a treadmill by temporarily increasing your coronary blood flow.   Please note: these test may take anywhere between 2-4 hours to complete  PLEASE REPORT TO [place] AT THE FIRST DESK WILL DIRECT YOU WHERE TO GO  Date of Procedure:_____________________________________  Arrival Time for Procedure:______________________________    PLEASE NOTIFY THE OFFICE AT LEAST 24 HOURS IN ADVANCE IF YOU ARE UNABLE TO KEEP YOUR APPOINTMENT.  [phone] AND  PLEASE NOTIFY NUCLEAR MEDICINE AT [facility] AT LEAST 24 HOURS IN ADVANCE IF YOU ARE UNABLE TO KEEP YOUR APPOINTMENT. [phone]  How to prepare for your Myoview test:  Do not eat or drink after midnight No caffeine for 24 hours prior to test No smoking 24 hours prior to test. Your medication may be taken with water.  If your doctor stopped a medication because of this test, do not take that  medication. Ladies, please do not wear dresses.  Skirts or pants are appropriate. Please wear a short sleeve shirt. No perfume or lotion. Wear comfortable walking shoes. No heels!

## 2022-01-01 NOTE — Telephone Encounter (Signed)
Reviewed the patient's chart. She is scheduled for a LS myoview on 01/12/22.

## 2022-01-11 ENCOUNTER — Ambulatory Visit
Admission: RE | Admit: 2022-01-11 | Discharge: 2022-01-11 | Disposition: A | Discharge status: Home / Self Care | Payer: Medicare Other | Source: Ambulatory Visit | Attending: Cardiovascular Disease | Admitting: Cardiovascular Disease

## 2022-01-11 DIAGNOSIS — I209 Angina pectoris, unspecified: Secondary | ICD-10-CM | POA: Diagnosis not present

## 2022-01-11 LAB — NM MYOCAR MULTI W/SPECT W/WALL MOTION / EF
Estimated workload: 1
Exercise duration (min): 0 min
Exercise duration (sec): 0 s
LV dias vol: 117 mL (ref 46–106)
LV sys vol: 37 mL
MPHR: 145 {beats}/min
Nuc Stress EF: 68 %
Peak HR: 85 {beats}/min
Percent HR: 58 %
Rest HR: 66 {beats}/min
Rest Nuclear Isotope Dose: 10.6 mCi
SDS: 3
SRS: 4
SSS: 6
ST Depression (mm): 0 mm
Stress Nuclear Isotope Dose: 32.6 mCi
TID: 0.88

## 2022-01-11 MED ORDER — REGADENOSON 0.4 MG/5ML IV SOLN
0.4000 mg | Freq: Once | INTRAVENOUS | Status: AC
  Administered 2022-01-11: 0.4 mg via INTRAVENOUS
  Filled 2022-01-11: qty 5

## 2022-01-11 MED ORDER — TECHNETIUM TC 99M TETROFOSMIN IV KIT
10.6000 | PACK | Freq: Once | INTRAVENOUS | Status: AC | PRN
  Administered 2022-01-11: 10.6 via INTRAVENOUS

## 2022-01-11 MED ORDER — TECHNETIUM TC 99M TETROFOSMIN IV KIT
32.5500 | PACK | Freq: Once | INTRAVENOUS | Status: AC | PRN
Start: 2022-01-11 — End: 2022-01-11
  Administered 2022-01-11: 32.55 via INTRAVENOUS

## 2022-01-12 ENCOUNTER — Other Ambulatory Visit: Payer: Medicare Other

## 2022-01-15 DIAGNOSIS — R002 Palpitations: Secondary | ICD-10-CM | POA: Diagnosis not present

## 2022-01-26 ENCOUNTER — Telehealth: Payer: Self-pay | Admitting: Cardiovascular Disease

## 2022-01-26 NOTE — Telephone Encounter (Signed)
Patient also states that she had a stress test done 2 weeks ago and no one called her with those results. She see's where they were released in [place] but she does not know what the results mean and would like explanation.

## 2022-01-26 NOTE — Telephone Encounter (Signed)
Reviewed results of stress test and advised that they were sent through her my chart. Then did a preliminary review of her heart monitor with the understanding that provider has not yet signed off on this report and that if it is normal they will send results through My Chart. She continued to be upset and wanted to know when she should follow up with provider. Reviewed that at her last visit he said that she could follow up as needed pending her results. She verbalized understanding and hung up the phone.

## 2022-01-26 NOTE — Telephone Encounter (Signed)
good afternoon! patient of Dr. Rockey Situ is requesting to speak with someone about her monitor test and would like to get results. says that she has been given the run around and will stay on the line until someone delivers them to her

## 2022-01-29 NOTE — Telephone Encounter (Signed)
Reviewed results of cardiac monitor w/ pt;   Lynn Merritts, MD  01/26/2022  5:31 PM EDT     Event monitor Normal rhythm Rare very short episodes of tachycardia less than 1/day Some extra beats noted, 1% of the time Triggered events associated with normal sinus rhythm Would continue metoprolol twice a day as she is taking   Would call us with some blood pressure measurements   Results from stress test previously sent to her through [place], no acute findings, no ischemia No further cardiac work-up needed at this time    She verbalizes understanding and is appreciative of the call.

## 2022-02-06 DIAGNOSIS — Z23 Encounter for immunization: Secondary | ICD-10-CM | POA: Diagnosis not present

## 2022-02-12 ENCOUNTER — Telehealth: Payer: Self-pay | Admitting: Cardiovascular Disease

## 2022-02-12 NOTE — Telephone Encounter (Signed)
  Pt c/o BP issue: STAT if pt c/o blurred vision, one-sided weakness or slurred speech  1. What are your last 5 BP readings?  153/59  168/78 155/82 57 175/89 162/87 55 145/67 62 160/71 64 150/63 70  2. Are you having any other symptoms (ex. Dizziness, headache, blurred vision, passed out)?   3. What is your BP issue? Pt calling to provider her BP reading. She wants to know if she needs to make changes on her medication. Also, pt said she is leaving on Wednesday 02/14/22 to go to [facility] for 2 months

## 2022-02-13 MED ORDER — HYDRALAZINE HCL 50 MG PO TABS: 50.0000 mg | ORAL_TABLET | Freq: Three times a day (TID) | ORAL | 3 refills | Status: DC

## 2022-02-13 MED ORDER — CARVEDILOL 25 MG PO TABS: 25.0000 mg | ORAL_TABLET | Freq: Two times a day (BID) | ORAL | 3 refills | Status: DC

## 2022-02-13 NOTE — Telephone Encounter (Signed)
February 13, 2022 Minna Merritts, MD to [facility] Triage      02/13/22  8:07 AM Blood pressure numbers elevated  We need to make several medication changes  I would typically start  ACE or ARB but will not start as she is leaving town and we would need to monitor renal function   For now would stop the metoprolol tartrate and start carvedilol 25 twice daily  Increase hydralazine up to 50-mg 3 times daily  Continue to monitor blood pressure  Thx  TGollan   Advised patient, verbalized understanding.   Patient wanting me to confirm that she is going from Hydralazine 25 mg twice a day to 50 mg three times a day   Will forward to Dr Rockey Situ to confirm

## 2022-02-16 ENCOUNTER — Telehealth: Payer: Self-pay | Admitting: Cardiovascular Disease

## 2022-02-16 NOTE — Telephone Encounter (Addendum)
Returned call to patient and her daughter.  Since taking Carvedilol and Hydralazine this AM pt is experiencing dizziness, tingling in fingers had brief episode of chest pressure that was relieved with "burping."  Patient's Bp running in 120's over 40's ,P-70's.  Pt has been taking plain Mucinex for several days and sudafed.  Encouraged pt to increase po intake fluids, change positions slowly and hold Bp meds until diastolic BP is in the 08'M to 70's.  Attempted to encourage not giving both Mucinex and Sudafed.  Forwarding message to Dr Rockey Situ and triage pool for additional instruction.  Instructed patient and her daughter if chest pressure returns, or symptoms change they should call 911.  Patient and her daughter both verbalized understanding of instructions.  Georgana Curio MHA RN CCM

## 2022-02-16 NOTE — Telephone Encounter (Signed)
Spoke with patient and her daughter. They feel like the new pill carvedilol is causing her symptoms. They would like to go back to metoprolol. Reviewed provider recommendations and they still felt strongly that this was due to carvedilol. Placed patient on hold and went to discuss with Dr. Rockey Situ verbally. He gave verbal orders to decrease carvedilol to 12.5 mg twice a day and decrease hydralazine 25 mg three times a day. They were agreeable with this plan but also wanted to inquire about the chest pain. Reviewed that if  her symptoms persist then they should proceed to ED for further evaluation. They both verbalized understanding of our conversation with no further questions at this time.

## 2022-02-16 NOTE — Telephone Encounter (Signed)
Pt c/o BP issue: STAT if pt c/o blurred vision, one-sided weakness or slurred speech  1. What are your last 5 BP readings?   107/44  HR 76 (Normally 150/78)  2. Are you having any other symptoms (ex. Dizziness, headache, blurred vision, passed out)?   Dizzy, lightheaded, tired, tingling in finger tips and lips  3. What is your BP issue?   Daughter called stating the patient was started on new medication (carvedilol (COREG) 25 MG tablet and had her  hydrALAZINE (APRESOLINE) 50 MG tablet increased) but she developed a cold and was taking Mucinex plain and Sudafed plain.  Daughter stated that this morning patient started having above symptoms.  Daughter stated the patient only started on her new medication this morning.  Daughter would like advice on next steps.

## 2022-02-16 NOTE — Telephone Encounter (Signed)
Returned call to patient to confirm medication change instructions as per Dr. Rockey Situ.  Hydralazine 50 mg 3 times daily. Pt. Verbalized understanding of instructions as given. Georgana Curio MHA RN CCM

## 2022-02-17 DIAGNOSIS — I10 Essential (primary) hypertension: Secondary | ICD-10-CM | POA: Diagnosis not present

## 2022-02-17 DIAGNOSIS — R059 Cough, unspecified: Secondary | ICD-10-CM | POA: Diagnosis not present

## 2022-02-17 DIAGNOSIS — E039 Hypothyroidism, unspecified: Secondary | ICD-10-CM | POA: Diagnosis not present

## 2022-02-17 DIAGNOSIS — E785 Hyperlipidemia, unspecified: Secondary | ICD-10-CM | POA: Diagnosis not present

## 2022-02-17 DIAGNOSIS — Z79899 Other long term (current) drug therapy: Secondary | ICD-10-CM | POA: Diagnosis not present

## 2022-02-17 DIAGNOSIS — Z20822 Contact with and (suspected) exposure to covid-19: Secondary | ICD-10-CM | POA: Diagnosis not present

## 2022-02-17 DIAGNOSIS — Z6841 Body Mass Index (BMI) 40.0 and over, adult: Secondary | ICD-10-CM | POA: Diagnosis not present

## 2022-02-17 DIAGNOSIS — I509 Heart failure, unspecified: Secondary | ICD-10-CM | POA: Diagnosis not present

## 2022-02-17 DIAGNOSIS — N189 Chronic kidney disease, unspecified: Secondary | ICD-10-CM | POA: Diagnosis not present

## 2022-02-17 DIAGNOSIS — Z23 Encounter for immunization: Secondary | ICD-10-CM | POA: Diagnosis not present

## 2022-02-17 DIAGNOSIS — R531 Weakness: Secondary | ICD-10-CM | POA: Diagnosis not present

## 2022-02-17 DIAGNOSIS — I130 Hypertensive heart and chronic kidney disease with heart failure and stage 1 through stage 4 chronic kidney disease, or unspecified chronic kidney disease: Secondary | ICD-10-CM | POA: Diagnosis not present

## 2022-02-17 DIAGNOSIS — Z79890 Hormone replacement therapy (postmenopausal): Secondary | ICD-10-CM | POA: Diagnosis not present

## 2022-02-17 DIAGNOSIS — R42 Dizziness and giddiness: Secondary | ICD-10-CM | POA: Diagnosis not present

## 2022-02-19 NOTE — Addendum Note (Signed)
Encounter addended by: Cory Roughen on: 02/19/2022 3:44 PM  Actions taken: Imaging Exam ended, Charge Capture section accepted

## 2022-02-26 DIAGNOSIS — Z9884 Bariatric surgery status: Secondary | ICD-10-CM | POA: Diagnosis not present

## 2022-02-26 DIAGNOSIS — I10 Essential (primary) hypertension: Secondary | ICD-10-CM | POA: Diagnosis present

## 2022-02-26 DIAGNOSIS — K8020 Calculus of gallbladder without cholecystitis without obstruction: Secondary | ICD-10-CM | POA: Diagnosis not present

## 2022-02-26 DIAGNOSIS — R17 Unspecified jaundice: Secondary | ICD-10-CM | POA: Diagnosis not present

## 2022-02-26 DIAGNOSIS — K838 Other specified diseases of biliary tract: Secondary | ICD-10-CM | POA: Diagnosis not present

## 2022-02-26 DIAGNOSIS — K8045 Calculus of bile duct with chronic cholecystitis with obstruction: Secondary | ICD-10-CM | POA: Diagnosis not present

## 2022-02-26 DIAGNOSIS — Z6841 Body Mass Index (BMI) 40.0 and over, adult: Secondary | ICD-10-CM | POA: Diagnosis not present

## 2022-02-26 DIAGNOSIS — E039 Hypothyroidism, unspecified: Secondary | ICD-10-CM | POA: Diagnosis present

## 2022-02-26 DIAGNOSIS — K8031 Calculus of bile duct with cholangitis, unspecified, with obstruction: Secondary | ICD-10-CM | POA: Diagnosis not present

## 2022-02-26 DIAGNOSIS — K7689 Other specified diseases of liver: Secondary | ICD-10-CM | POA: Diagnosis present

## 2022-02-26 DIAGNOSIS — K8070 Calculus of gallbladder and bile duct without cholecystitis without obstruction: Secondary | ICD-10-CM | POA: Diagnosis present

## 2022-02-26 DIAGNOSIS — Z79899 Other long term (current) drug therapy: Secondary | ICD-10-CM | POA: Diagnosis not present

## 2022-02-26 DIAGNOSIS — K8071 Calculus of gallbladder and bile duct without cholecystitis with obstruction: Secondary | ICD-10-CM | POA: Diagnosis not present

## 2022-02-26 DIAGNOSIS — K8021 Calculus of gallbladder without cholecystitis with obstruction: Secondary | ICD-10-CM | POA: Diagnosis not present

## 2022-02-26 DIAGNOSIS — R109 Unspecified abdominal pain: Secondary | ICD-10-CM | POA: Diagnosis not present

## 2022-02-26 DIAGNOSIS — K8080 Other cholelithiasis without obstruction: Secondary | ICD-10-CM | POA: Diagnosis not present

## 2022-02-26 DIAGNOSIS — E785 Hyperlipidemia, unspecified: Secondary | ICD-10-CM | POA: Diagnosis not present

## 2022-02-26 DIAGNOSIS — R7401 Elevation of levels of liver transaminase levels: Secondary | ICD-10-CM | POA: Diagnosis not present

## 2022-02-26 DIAGNOSIS — E7800 Pure hypercholesterolemia, unspecified: Secondary | ICD-10-CM | POA: Diagnosis present

## 2022-02-26 DIAGNOSIS — K8050 Calculus of bile duct without cholangitis or cholecystitis without obstruction: Secondary | ICD-10-CM | POA: Diagnosis not present

## 2022-02-26 DIAGNOSIS — Z789 Other specified health status: Secondary | ICD-10-CM | POA: Diagnosis not present

## 2022-02-26 DIAGNOSIS — D649 Anemia, unspecified: Secondary | ICD-10-CM | POA: Diagnosis present

## 2022-02-26 DIAGNOSIS — R0789 Other chest pain: Secondary | ICD-10-CM | POA: Diagnosis not present

## 2022-02-26 DIAGNOSIS — E669 Obesity, unspecified: Secondary | ICD-10-CM | POA: Diagnosis present

## 2022-02-27 DIAGNOSIS — Z6841 Body Mass Index (BMI) 40.0 and over, adult: Secondary | ICD-10-CM | POA: Diagnosis not present

## 2022-02-27 DIAGNOSIS — K8020 Calculus of gallbladder without cholecystitis without obstruction: Secondary | ICD-10-CM | POA: Diagnosis not present

## 2022-02-27 DIAGNOSIS — E7800 Pure hypercholesterolemia, unspecified: Secondary | ICD-10-CM | POA: Diagnosis present

## 2022-02-27 DIAGNOSIS — R17 Unspecified jaundice: Secondary | ICD-10-CM | POA: Diagnosis not present

## 2022-02-27 DIAGNOSIS — K8070 Calculus of gallbladder and bile duct without cholecystitis without obstruction: Secondary | ICD-10-CM | POA: Diagnosis present

## 2022-02-27 DIAGNOSIS — K7689 Other specified diseases of liver: Secondary | ICD-10-CM | POA: Diagnosis present

## 2022-02-27 DIAGNOSIS — E785 Hyperlipidemia, unspecified: Secondary | ICD-10-CM | POA: Diagnosis not present

## 2022-02-27 DIAGNOSIS — K8031 Calculus of bile duct with cholangitis, unspecified, with obstruction: Secondary | ICD-10-CM | POA: Diagnosis not present

## 2022-02-27 DIAGNOSIS — K8050 Calculus of bile duct without cholangitis or cholecystitis without obstruction: Secondary | ICD-10-CM | POA: Diagnosis not present

## 2022-02-27 DIAGNOSIS — R7401 Elevation of levels of liver transaminase levels: Secondary | ICD-10-CM | POA: Diagnosis not present

## 2022-02-27 DIAGNOSIS — I10 Essential (primary) hypertension: Secondary | ICD-10-CM | POA: Diagnosis present

## 2022-02-27 DIAGNOSIS — K8080 Other cholelithiasis without obstruction: Secondary | ICD-10-CM | POA: Diagnosis not present

## 2022-02-27 DIAGNOSIS — Z9884 Bariatric surgery status: Secondary | ICD-10-CM | POA: Diagnosis not present

## 2022-02-27 DIAGNOSIS — E669 Obesity, unspecified: Secondary | ICD-10-CM | POA: Diagnosis present

## 2022-02-27 DIAGNOSIS — E039 Hypothyroidism, unspecified: Secondary | ICD-10-CM | POA: Diagnosis present

## 2022-02-27 DIAGNOSIS — D649 Anemia, unspecified: Secondary | ICD-10-CM | POA: Diagnosis present

## 2022-02-27 DIAGNOSIS — K8021 Calculus of gallbladder without cholecystitis with obstruction: Secondary | ICD-10-CM | POA: Diagnosis not present

## 2022-03-08 ENCOUNTER — Emergency Department (HOSPITAL_BASED_OUTPATIENT_CLINIC_OR_DEPARTMENT_OTHER): Payer: Medicare Other

## 2022-03-08 ENCOUNTER — Inpatient Hospital Stay (HOSPITAL_BASED_OUTPATIENT_CLINIC_OR_DEPARTMENT_OTHER)
Admission: EM | Admit: 2022-03-08 | Discharge: 2022-03-13 | DRG: 982 | Disposition: A | Discharge status: Home / Self Care | Payer: Medicare Other | Attending: Internal Medicine | Admitting: Internal Medicine

## 2022-03-08 ENCOUNTER — Other Ambulatory Visit: Payer: Self-pay

## 2022-03-08 ENCOUNTER — Encounter (HOSPITAL_COMMUNITY): Payer: Self-pay

## 2022-03-08 ENCOUNTER — Encounter (HOSPITAL_BASED_OUTPATIENT_CLINIC_OR_DEPARTMENT_OTHER): Payer: Self-pay | Admitting: Emergency Medicine

## 2022-03-08 DIAGNOSIS — H409 Unspecified glaucoma: Secondary | ICD-10-CM | POA: Diagnosis not present

## 2022-03-08 DIAGNOSIS — I130 Hypertensive heart and chronic kidney disease with heart failure and stage 1 through stage 4 chronic kidney disease, or unspecified chronic kidney disease: Secondary | ICD-10-CM | POA: Diagnosis present

## 2022-03-08 DIAGNOSIS — Z6837 Body mass index (BMI) 37.0-37.9, adult: Secondary | ICD-10-CM

## 2022-03-08 DIAGNOSIS — I10 Essential (primary) hypertension: Secondary | ICD-10-CM | POA: Diagnosis not present

## 2022-03-08 DIAGNOSIS — E669 Obesity, unspecified: Secondary | ICD-10-CM | POA: Diagnosis present

## 2022-03-08 DIAGNOSIS — I4891 Unspecified atrial fibrillation: Secondary | ICD-10-CM | POA: Diagnosis not present

## 2022-03-08 DIAGNOSIS — E860 Dehydration: Secondary | ICD-10-CM | POA: Diagnosis present

## 2022-03-08 DIAGNOSIS — E039 Hypothyroidism, unspecified: Secondary | ICD-10-CM | POA: Diagnosis not present

## 2022-03-08 DIAGNOSIS — I480 Paroxysmal atrial fibrillation: Secondary | ICD-10-CM | POA: Diagnosis not present

## 2022-03-08 DIAGNOSIS — N1832 Chronic kidney disease, stage 3b: Secondary | ICD-10-CM | POA: Diagnosis present

## 2022-03-08 DIAGNOSIS — Z85828 Personal history of other malignant neoplasm of skin: Secondary | ICD-10-CM

## 2022-03-08 DIAGNOSIS — N179 Acute kidney failure, unspecified: Secondary | ICD-10-CM | POA: Diagnosis present

## 2022-03-08 DIAGNOSIS — K529 Noninfective gastroenteritis and colitis, unspecified: Secondary | ICD-10-CM | POA: Diagnosis present

## 2022-03-08 DIAGNOSIS — D638 Anemia in other chronic diseases classified elsewhere: Secondary | ICD-10-CM | POA: Diagnosis not present

## 2022-03-08 DIAGNOSIS — D631 Anemia in chronic kidney disease: Secondary | ICD-10-CM | POA: Diagnosis present

## 2022-03-08 DIAGNOSIS — N189 Chronic kidney disease, unspecified: Secondary | ICD-10-CM

## 2022-03-08 DIAGNOSIS — J90 Pleural effusion, not elsewhere classified: Secondary | ICD-10-CM | POA: Diagnosis not present

## 2022-03-08 DIAGNOSIS — E0590 Thyrotoxicosis, unspecified without thyrotoxic crisis or storm: Secondary | ICD-10-CM | POA: Diagnosis present

## 2022-03-08 DIAGNOSIS — Z79899 Other long term (current) drug therapy: Secondary | ICD-10-CM | POA: Diagnosis not present

## 2022-03-08 DIAGNOSIS — Z8249 Family history of ischemic heart disease and other diseases of the circulatory system: Secondary | ICD-10-CM | POA: Diagnosis not present

## 2022-03-08 DIAGNOSIS — I5032 Chronic diastolic (congestive) heart failure: Secondary | ICD-10-CM | POA: Diagnosis present

## 2022-03-08 DIAGNOSIS — K660 Peritoneal adhesions (postprocedural) (postinfection): Secondary | ICD-10-CM | POA: Diagnosis present

## 2022-03-08 DIAGNOSIS — E872 Acidosis, unspecified: Secondary | ICD-10-CM | POA: Diagnosis present

## 2022-03-08 DIAGNOSIS — Z9884 Bariatric surgery status: Secondary | ICD-10-CM

## 2022-03-08 DIAGNOSIS — Z841 Family history of disorders of kidney and ureter: Secondary | ICD-10-CM

## 2022-03-08 DIAGNOSIS — I959 Hypotension, unspecified: Secondary | ICD-10-CM | POA: Diagnosis present

## 2022-03-08 DIAGNOSIS — N178 Other acute kidney failure: Secondary | ICD-10-CM | POA: Diagnosis not present

## 2022-03-08 DIAGNOSIS — R002 Palpitations: Secondary | ICD-10-CM | POA: Diagnosis not present

## 2022-03-08 DIAGNOSIS — K8010 Calculus of gallbladder with chronic cholecystitis without obstruction: Secondary | ICD-10-CM | POA: Diagnosis not present

## 2022-03-08 DIAGNOSIS — F410 Panic disorder [episodic paroxysmal anxiety] without agoraphobia: Secondary | ICD-10-CM | POA: Diagnosis present

## 2022-03-08 DIAGNOSIS — K8070 Calculus of gallbladder and bile duct without cholecystitis without obstruction: Secondary | ICD-10-CM | POA: Diagnosis not present

## 2022-03-08 DIAGNOSIS — Z434 Encounter for attention to other artificial openings of digestive tract: Secondary | ICD-10-CM | POA: Diagnosis not present

## 2022-03-08 DIAGNOSIS — Z79890 Hormone replacement therapy (postmenopausal): Secondary | ICD-10-CM

## 2022-03-08 DIAGNOSIS — Z8349 Family history of other endocrine, nutritional and metabolic diseases: Secondary | ICD-10-CM

## 2022-03-08 DIAGNOSIS — K810 Acute cholecystitis: Secondary | ICD-10-CM | POA: Diagnosis not present

## 2022-03-08 DIAGNOSIS — K8020 Calculus of gallbladder without cholecystitis without obstruction: Secondary | ICD-10-CM | POA: Diagnosis not present

## 2022-03-08 DIAGNOSIS — J9811 Atelectasis: Secondary | ICD-10-CM | POA: Diagnosis not present

## 2022-03-08 DIAGNOSIS — Z9851 Tubal ligation status: Secondary | ICD-10-CM

## 2022-03-08 DIAGNOSIS — R0602 Shortness of breath: Secondary | ICD-10-CM | POA: Diagnosis not present

## 2022-03-08 DIAGNOSIS — R1011 Right upper quadrant pain: Secondary | ICD-10-CM | POA: Diagnosis not present

## 2022-03-08 LAB — COMPREHENSIVE METABOLIC PANEL
ALT: 33 U/L (ref 0–44)
AST: 16 U/L (ref 15–41)
Albumin: 4.1 g/dL (ref 3.5–5.0)
Alkaline Phosphatase: 271 U/L — ABNORMAL HIGH (ref 38–126)
Anion gap: 15 (ref 5–15)
BUN: 49 mg/dL — ABNORMAL HIGH (ref 8–23)
CO2: 18 mmol/L — ABNORMAL LOW (ref 22–32)
Calcium: 9.4 mg/dL (ref 8.9–10.3)
Chloride: 100 mmol/L (ref 98–111)
Creatinine, Ser: 2.71 mg/dL — ABNORMAL HIGH (ref 0.44–1.00)
GFR, Estimated: 18 mL/min — ABNORMAL LOW (ref >60)
Glucose, Bld: 121 mg/dL — ABNORMAL HIGH (ref 70–99)
Potassium: 4.5 mmol/L (ref 3.5–5.1)
Sodium: 133 mmol/L — ABNORMAL LOW (ref 135–145)
Total Bilirubin: 0.9 mg/dL (ref 0.3–1.2)
Total Protein: 7.9 g/dL (ref 6.5–8.1)

## 2022-03-08 LAB — CBC WITH DIFFERENTIAL/PLATELET
Abs Immature Granulocytes: 0.17 10*3/uL — ABNORMAL HIGH (ref 0.00–0.07)
Basophils Absolute: 0.1 10*3/uL (ref 0.0–0.1)
Basophils Relative: 0 %
Eosinophils Absolute: 0.1 10*3/uL (ref 0.0–0.5)
Eosinophils Relative: 0 %
HCT: 35.1 % — ABNORMAL LOW (ref 36.0–46.0)
Hemoglobin: 11 g/dL — ABNORMAL LOW (ref 12.0–15.0)
Immature Granulocytes: 1 %
Lymphocytes Relative: 9 %
Lymphs Abs: 1.4 10*3/uL (ref 0.7–4.0)
MCH: 27.3 pg (ref 26.0–34.0)
MCHC: 31.3 g/dL (ref 30.0–36.0)
MCV: 87.1 fL (ref 80.0–100.0)
Monocytes Absolute: 1.6 10*3/uL — ABNORMAL HIGH (ref 0.1–1.0)
Monocytes Relative: 10 %
Neutro Abs: 12.6 10*3/uL — ABNORMAL HIGH (ref 1.7–7.7)
Neutrophils Relative %: 80 %
Platelets: 416 10*3/uL — ABNORMAL HIGH (ref 150–400)
RBC: 4.03 MIL/uL (ref 3.87–5.11)
RDW: 13.8 % (ref 11.5–15.5)
WBC: 15.9 10*3/uL — ABNORMAL HIGH (ref 4.0–10.5)
nRBC: 0 % (ref 0.0–0.2)

## 2022-03-08 LAB — BRAIN NATRIURETIC PEPTIDE: B Natriuretic Peptide: 180.3 pg/mL — ABNORMAL HIGH (ref 0.0–100.0)

## 2022-03-08 LAB — LACTIC ACID, PLASMA: Lactic Acid, Venous: 0.8 mmol/L (ref 0.5–1.9)

## 2022-03-08 LAB — TSH: TSH: 0.241 u[IU]/mL — ABNORMAL LOW (ref 0.350–4.500)

## 2022-03-08 LAB — T4, FREE: Free T4: 1.17 ng/dL — ABNORMAL HIGH (ref 0.61–1.12)

## 2022-03-08 LAB — MAGNESIUM: Magnesium: 1.6 mg/dL — ABNORMAL LOW (ref 1.7–2.4)

## 2022-03-08 MED ORDER — ONDANSETRON HCL 4 MG/2ML IJ SOLN: 4.0000 mg | Freq: Four times a day (QID) | INTRAMUSCULAR | Status: DC | PRN

## 2022-03-08 MED ORDER — LACTATED RINGERS IV BOLUS: 500.0000 mL | Freq: Once | INTRAVENOUS | Status: DC

## 2022-03-08 MED ORDER — LACTATED RINGERS IV SOLN: INTRAVENOUS | Status: DC

## 2022-03-08 MED ORDER — SODIUM CHLORIDE 0.9 % IV SOLN: INTRAVENOUS | Status: DC

## 2022-03-08 MED ORDER — ACETAMINOPHEN 650 MG RE SUPP: 650.0000 mg | Freq: Four times a day (QID) | RECTAL | Status: DC | PRN

## 2022-03-08 MED ORDER — METOPROLOL TARTRATE 50 MG PO TABS
50.0000 mg | ORAL_TABLET | Freq: Two times a day (BID) | ORAL | Status: DC
  Administered 2022-03-08: 50 mg via ORAL
  Filled 2022-03-08: qty 1

## 2022-03-08 MED ORDER — SODIUM CHLORIDE 0.9% FLUSH
3.0000 mL | Freq: Two times a day (BID) | INTRAVENOUS | Status: DC
  Administered 2022-03-08 – 2022-03-12 (×7): 3 mL via INTRAVENOUS

## 2022-03-08 MED ORDER — MAGNESIUM SULFATE 2 GM/50ML IV SOLN
2.0000 g | Freq: Once | INTRAVENOUS | Status: AC
  Administered 2022-03-08: 2 g via INTRAVENOUS
  Filled 2022-03-08: qty 50

## 2022-03-08 MED ORDER — LEVOTHYROXINE SODIUM 25 MCG PO TABS
125.0000 ug | ORAL_TABLET | Freq: Every day | ORAL | Status: DC
  Administered 2022-03-09 – 2022-03-11 (×3): 125 ug via ORAL
  Filled 2022-03-08 (×3): qty 1

## 2022-03-08 MED ORDER — SODIUM CHLORIDE 0.9 % IV SOLN
2.0000 g | INTRAVENOUS | Status: DC
  Administered 2022-03-09: 2 g via INTRAVENOUS
  Filled 2022-03-08: qty 12.5

## 2022-03-08 MED ORDER — SODIUM CHLORIDE 0.9 % IV BOLUS
500.0000 mL | Freq: Once | INTRAVENOUS | Status: AC
  Administered 2022-03-08: 500 mL via INTRAVENOUS

## 2022-03-08 MED ORDER — ONDANSETRON HCL 4 MG PO TABS: 4.0000 mg | ORAL_TABLET | Freq: Four times a day (QID) | ORAL | Status: DC | PRN

## 2022-03-08 MED ORDER — METRONIDAZOLE 500 MG/100ML IV SOLN
500.0000 mg | Freq: Once | INTRAVENOUS | Status: AC
  Administered 2022-03-09: 500 mg via INTRAVENOUS
  Filled 2022-03-08: qty 100

## 2022-03-08 MED ORDER — SODIUM CHLORIDE 0.9 % IV SOLN
2.0000 g | Freq: Once | INTRAVENOUS | Status: AC
  Administered 2022-03-08: 2 g via INTRAVENOUS
  Filled 2022-03-08: qty 12.5

## 2022-03-08 MED ORDER — SODIUM CHLORIDE 0.9 % IV SOLN: Freq: Once | INTRAVENOUS | Status: AC

## 2022-03-08 MED ORDER — OXYCODONE HCL 5 MG PO TABS
5.0000 mg | ORAL_TABLET | ORAL | Status: DC | PRN
  Administered 2022-03-09 – 2022-03-10 (×3): 5 mg via ORAL
  Filled 2022-03-08 (×5): qty 1

## 2022-03-08 MED ORDER — LEVOTHYROXINE SODIUM 125 MCG PO CAPS: 125.0000 ug | ORAL_CAPSULE | Freq: Every day | ORAL | Status: DC

## 2022-03-08 MED ORDER — DILTIAZEM HCL-DEXTROSE 125-5 MG/125ML-% IV SOLN (PREMIX)
5.0000 mg/h | INTRAVENOUS | Status: DC
  Administered 2022-03-08 – 2022-03-09 (×2): 5 mg/h via INTRAVENOUS
  Administered 2022-03-10 – 2022-03-11 (×2): 10 mg/h via INTRAVENOUS
  Filled 2022-03-08 (×4): qty 125

## 2022-03-08 MED ORDER — HYDRALAZINE HCL 20 MG/ML IJ SOLN: 5.0000 mg | INTRAMUSCULAR | Status: DC | PRN

## 2022-03-08 MED ORDER — ACETAMINOPHEN 325 MG PO TABS
650.0000 mg | ORAL_TABLET | Freq: Four times a day (QID) | ORAL | Status: DC | PRN
  Administered 2022-03-12 – 2022-03-13 (×2): 650 mg via ORAL
  Filled 2022-03-08 (×4): qty 2

## 2022-03-08 MED ORDER — METRONIDAZOLE 500 MG/100ML IV SOLN
500.0000 mg | Freq: Once | INTRAVENOUS | Status: AC
  Administered 2022-03-08: 500 mg via INTRAVENOUS
  Filled 2022-03-08: qty 100

## 2022-03-08 NOTE — Assessment & Plan Note (Signed)
-  s/p gastric bypass - which may complicate her surgical condition -Body mass index is 37.44 kg/m..  -Ongoing weight loss should be encouraged

## 2022-03-08 NOTE — Assessment & Plan Note (Signed)
-  Likely associated with significant (800-1074m/day) drain output and inability to keep up with output -Will start IVF -Attempt to avoid nephrotoxic medications -Recheck BMP in AM

## 2022-03-08 NOTE — ED Notes (Signed)
Patient transported to CT 

## 2022-03-08 NOTE — Progress Notes (Signed)
Pharmacy Antibiotic Note  Lynn Joseph is a 76 y.o. female admitted on 03/08/2022 with intra-abdominal infection.  Pharmacy has been consulted for cefepime dosing. Pt is afebrile and WBC is elevated at 15.9. Scr is also above baseline at 2.71.   Plan: Cefepime 2gm IV Q24H F/u renal fxn ,C&S, clinical status   Height: '5\' 5"'$  (165.1 cm) Weight: 102.1 kg (225 lb) IBW/kg (Calculated) : 57  Temp (24hrs), Avg:97.9 F (36.6 C), Min:97.9 F (36.6 C), Max:97.9 F (36.6 C)  Recent Labs  Lab 03/08/22 1404 03/08/22 1433  WBC 15.9*  --   CREATININE 2.71*  --   LATICACIDVEN  --  0.8    Estimated Creatinine Clearance: 20.9 mL/min (A) (by C-G formula based on SCr of 2.71 mg/dL (H)).    Allergies  Allergen Reactions   No Known Allergies     Antimicrobials this admission: Cefepime 11/16>> Flagyl x 1 11/16  Dose adjustments this admission: N/A  Microbiology results: Pending  Thank you for allowing pharmacy to be a part of this patient's care.  Ayham Word, Rande Lawman 03/08/2022 3:15 PM

## 2022-03-08 NOTE — ED Notes (Signed)
Called [facility] and spoke to [place]; stated that Hospitalist will contact Dr. Philip Aspen in 10-15 minutes

## 2022-03-08 NOTE — ED Notes (Signed)
Completed assessment with hospitalist

## 2022-03-08 NOTE — Consult Note (Signed)
Initial Consultation Note - Televisit   Patient: Lynn Joseph FYB:[id number] DOB: December 09, 1945 PCP: Pcp, No DOA: 03/08/2022 DOS: the patient was seen and examined on 03/08/2022 Primary service: Default, Provider, MD  Referring physician: Philip Aspen - ED Reason for consult: New onset afib with RVR.  Was going to see surgery today, had palpitations.  In ER in afib with RVR, HR 140s.  Started on Dilt drip.  Also with AKI.  Given IL IVF.  HR is 108 currently.   Cardiology and surgery both consulted.  CT done - pericapsular hematoma, surgery thinks [facility] ok but cardiology less enthusiastic and recommends rate control.  Surgery recommends ongoing perc drain and f/u with them.  On Cefepime and Flagyl.    Assessment and Plan: * New onset atrial fibrillation [facility]) -Patient presenting with new-onset afib.  -Etiology is thought to be related to infection and/or AKI -Will admit to SDU for Diltiazem drip as per protocol with plan to transition to PO Diltiazem once heart rate is controlled (resting HR 110 or lower); patient needs admission based on afib associated with a high-risk situation (acute cholecystitis). -Will request Echocardiogram for further evaluation  -EDP consulted cardiology and I have sent an in-box message asking that she be seen in the AM.  -Heart rate is better controlled with Dilt drip. -CHA2DS2-VASc Score is >2 and so patient would benefit from oral anticoagulation. There is evidence of net benefit even in the extremely elderly population. -Cardiology has recommended holding [facility] for now given a possible subcapsular hematoma.  AKI (acute kidney injury) ([place]) -Likely associated with significant (800-1071m/day) drain output and inability to keep up with output -Will start IVF -Attempt to avoid nephrotoxic medications -Recheck BMP in AM   Acute cholecystitis -Records are not currently available but her daughter does have records with her -The patient appears to have had acute  [place]Given her h/o gastric bypass, they were unable to do ERCP and so based on MRCP placed a biliary drain -She was recommended to have cholecystectomy but [facility]-She had a visit today where she was scheduled to see surgery, but this was missed when she had to come to the ER instead -Surgery will plan to consult tomorrow; if issues develop overnight requiring more acute intervention, [facility] can call for her to be seen sooner  -In the meantime, will continue antibiotics but transition to IV -Will allow her to eat but make her NPO after MN  Benign essential hypertension -Will hold metoprolol, hydralazine, and amlodipine while on Diltiazem drip  Hypothyroidism -Continue Synthroid at current dose for now  -Her TSH is suppressed and so it may be reasonable to decrease her Synthroid dose   Obesity -s/p gastric bypass - which may complicate her surgical condition -Body mass index is 37.44 kg/m..  -Ongoing weight loss should be encouraged    *[facility]  [facility] a [facility]     HPI: Lynn ZIESMERis a 76y.o. female with past medical history of HTN, hypothyroidism, class 2 obesity s/p Roux-en-Y, glaucoma, and chronic diastolic CHF presenting with SOB, palpitations.  She had cholecystitis recently, had perc drained [place] in [place].  [facility] and had a change in BP medications leading to chest pain, lethargic, wasn't talking.  PMarina FVirginia  Changed to  carvedilol (from metoprolol), got dizzy, hypotension and then CP, belching.  They took her back off that and resumed metoprolol and kept her on hydralazine.  She was better but then had chest pain and belching again and they went back to the ER. They said it was her GB and a blocked bile duct.  They gave her abx and a  transhepatic cholangiogram on Wednesday last week and left her with a bile bag and recommended cholecystectomy.  She was discharged Saturday and drove home Sunday.  During the week she has been tired and without energy.  This AM, she was due to see the surgeon for a consult and was having chest fluttering.  Apple Watch showed afib.  She is feeling well currently.  No current chest discomfort.  No further issues with abdominal pain.  The drain is putting out 800-1039m per day, consistent since placement.    Review of Systems: As mentioned in the history of present illness. All other systems reviewed and are negative. Past Medical History:  Diagnosis Date   Anemia of chronic disease    CHF (congestive heart failure) [place]    Chronic diarrhea    Glaucoma    HTN (hypertension)    Hypertension    Hypothyroid    Obesity    Skin cancer    Past Surgical History:  Procedure Laterality Date   GASTRIC BYPASS     GASTRIC RESTRICTION SURGERY     TUBAL LIGATION     Social History:  reports that she has never smoked. She has never used smokeless tobacco. She reports that she does not drink alcohol and does not use drugs.  Allergies  Allergen Reactions   No Known Allergies     Family History  Problem Relation Age of Onset   Hypertension Mother    Thyroid disease Mother    Stroke Mother    Kidney disease Father    Diabetes Father     Prior to Admission medications   Medication Sig Start Date End Date Taking? Authorizing Provider  Calcium Carbonate-Vitamin D (CALCIUM 600+D HIGH POTENCY) 600-400 MG-UNIT tablet Take 1 tablet by mouth daily.    [provider]  carvedilol (COREG) 25 MG tablet Take 1 tablet (25 mg total) by mouth 2 (two) times daily. 02/13/22   GMinna Merritts MD  gabapentin (NEURONTIN) 100 MG capsule Take 100 mg by mouth 3 times/day as needed-between meals & bedtime. 02/23/20   [provider]  gabapentin (NEURONTIN) 300 MG capsule Take 1 capsule (300 mg  total) by mouth at bedtime. Patient not taking: Reported on 12/22/2021 01/18/20   BMcarthur Rossetti MD  hydrALAZINE (APRESOLINE) 50 MG tablet Take 1 tablet (50 mg total) by mouth 3 (three) times daily. 02/13/22   GMinna Merritts MD  Multiple Vitamin (MULTIVITAMIN WITH MINERALS) TABS tablet Take 1 tablet by mouth daily.    [provider]  rosuvastatin (CRESTOR) 5 MG tablet Take 5 mg by mouth daily. 02/23/20   [provider]  TIROSINT 125 MCG CAPS Take 125 mcg by mouth daily. 10/22/18   [provider]  triamterene-hydrochlorothiazide (DYAZIDE) 37.5-25 MG capsule Take 1 capsule by mouth daily. 08/07/18   [provider]    Physical Exam: Vitals:   03/08/22 1645 03/08/22 1650 03/08/22 1700 03/08/22 1745  BP: 112/65   111/71  Pulse: (!) 126 (!) 101 (!) 105 (!) 109  Resp: 18 18 (!) 23 20  Temp:      SpO2: 95% 96% 96%  94%  Weight:      Height:       Physical exam performed by RN with virtual observation via [facility]:  General:  Appears calm and comfortable and is in NAD Eyes:  PERRL, EOMI, normal lids, iris ENT:  grossly normal hearing, lips & tongue, mmm; appropriate dentition Neck:  no LAD, masses or thyromegaly Cardiovascular:  Irregularly irregular with mild tachycardia per RN. No LE edema.  Respiratory:   CTA bilaterally with no wheezes/rales/rhonchi with mildly diminished breath sounds per RN.  Normal respiratory effort on RA. Abdomen:  soft, NT, ND; RUQ biliary drain in place with bilious drainage Skin:  no rash or induration seen on limited exam Musculoskeletal:  grossly normal tone BUE/BLE, good ROM, no bony abnormality Psychiatric:  grossly normal mood and affect, speech fluent and appropriate, AOx3 Neurologic:  CN 2-12 grossly intact, moves all extremities in coordinated fashion   Radiological Exams on Admission: Independently reviewed - see discussion in A/P where applicable  CT CHEST ABDOMEN PELVIS WO CONTRAST  Result Date:  03/08/2022 CLINICAL DATA:  Right upper quadrant pain, recent per chole tube. EXAM: CT CHEST, ABDOMEN AND PELVIS WITHOUT CONTRAST TECHNIQUE: Multidetector CT imaging of the chest, abdomen and pelvis was performed following the standard protocol without IV contrast. RADIATION DOSE REDUCTION: This exam was performed according to the departmental dose-optimization program which includes automated exposure control, adjustment of the mA and/or kV according to patient size and/or use of iterative reconstruction technique. COMPARISON:  None Available. FINDINGS: CT CHEST FINDINGS Cardiovascular: Normal heart size. No pericardial effusion. Normal caliber thoracic aorta with mild calcified plaque. No visible coronary artery calcifications. Mediastinum/Nodes: Small hiatal hernia. No pathologically enlarged lymph nodes seen in the chest Lungs/Pleura: Central airways are patent small right pleural effusion and right basilar atelectasis. No consolidation, pleural effusion or pneumothorax Musculoskeletal: No chest wall mass or suspicious bone lesions identified. CT ABDOMEN PELVIS FINDINGS Hepatobiliary: Small subcapsular collection located adjacent to the percutaneous biliary drain measuring 5.3 x 1.3 cm. Decompressed gallbladder with gallstones evidence of biliary ductal dilation. Common bile duct drain in place. No evidence of biliary ductal dilation. Pancreas: Unremarkable. No pancreatic ductal dilatation or surrounding inflammatory changes. Spleen: Normal in size without focal abnormality. Adrenals/Urinary Tract: Bilateral adrenal glands are unremarkable. No hydronephrosis or nephrolithiasis. Bladder is unremarkable. Stomach/Bowel: Postsurgical changes of the stomach. No bowel wall thickening, inflammatory change or evidence of obstruction. Normal appendix Vascular/Lymphatic: Aortic atherosclerosis. No enlarged abdominal or pelvic lymph nodes. Reproductive: Uterus and bilateral adnexa are unremarkable. Other: Trace  high-density free fluid seen in the pelvis. Musculoskeletal: No acute or significant osseous findings. IMPRESSION: 1. Small subcapsular collection located adjacent to the percutaneous biliary drain, likely postprocedural hematoma or biloma. HIDA scan could assist with further evaluation. 2. Small right pleural effusion and right basilar atelectasis. 3. Trace high-density free fluid seen in the pelvis, likely due to small volume hemoperitoneum related to recent procedure. 4. Aortic Atherosclerosis (ICD10-I70.0). Electronically Signed   By: Yetta Glassman M.D.   On: 03/08/2022 15:48    EKG: Independently reviewed.  Afib with rate 141; nonspecific ST changes that may be rate related   Labs on Admission: I have personally reviewed the available labs and imaging studies at the time of the admission.  Pertinent labs:    CO2 18 Glucose 121 BUN 49/Creatinine 2.71/GFR 18; 37/1.56/34 on 9/1 Mag++ 1.6 AP 271 BNP 180.3 Lactate 0.8 WBC 15.9 Hgb 11 Platelets 416 TSH 0.241 Blood culture x 1 pending   Family Communication: Daughter was  present throughout video visit and participated Primary team communication: I spoke with the EDP at the time of the consult  Thank you very much for involving [name] in the care of your patient.  Author: Karmen Bongo, MD 03/08/2022 6:26 PM  For on call review [email or web address].

## 2022-03-08 NOTE — Progress Notes (Signed)
[facility] following code sepsis °

## 2022-03-08 NOTE — Assessment & Plan Note (Signed)
-  Will hold metoprolol, hydralazine, and amlodipine while on Diltiazem drip

## 2022-03-08 NOTE — Consult Note (Signed)
Cardiology Consultation   [name]: RULA KENISTON MRN: [id number]; DOB: 1945-05-12  Admit date: 03/08/2022 Date of Consult: 03/08/2022  PCP:  Kathyrn Lass   [facility] Providers Cardiologist:  Ida Rogue     Patient Profile:   Lynn Joseph is a 76 y.o. female with a no significant prior cardiac history who is being seen 03/08/2022 for the evaluation of new-dx AF at the request of Dr. Lorin Mercy.  History of Present Illness:   Lynn Joseph  had cholecystitis recently, had perc drained placed in [place].  She was at the ER in [facility] because her BP numbers were down and had a change in BP medications leading to chest pain, lethargic, wasn't talking.  [place], [place].  Changed to carvedilol (from metoprolol), got dizzy, hypotension and then CP, belching.  They took her back off that and resumed metoprolol and kept her on hydralazine.  She was better but then had chest pain and belching again and they went back to the ER. They said it was her GB and a blocked bile duct.  They gave her abx and a transhepatic cholangiogram on Wednesday last week and left her with a bile bag and recommended cholecystectomy.  She was discharged Saturday and drove home Sunday.  During the week she has been tired and without energy.  This AM, she was due to see the surgeon for a consult and was having chest fluttering.  Apple Watch showed afib.  She is feeling well currently.  No current chest discomfort.  No further issues with abdominal pain.    Past Medical History:  Diagnosis Date   Anemia of chronic disease    Chronic diarrhea    Glaucoma    HTN (hypertension)    Hypertension    Hypothyroid    Obesity    Skin cancer     Past Surgical History:  Procedure Laterality Date   GASTRIC BYPASS     GASTRIC RESTRICTION SURGERY     TUBAL LIGATION       Home Medications:  Prior to Admission medications   Medication Sig Start Date End Date Taking? Authorizing Provider  Calcium Carbonate-Vitamin D  (CALCIUM 600+D HIGH POTENCY) 600-400 MG-UNIT tablet Take 1 tablet by mouth daily.    [provider]  gabapentin (NEURONTIN) 100 MG capsule Take 100 mg by mouth 3 times/day as needed-between meals & bedtime. 02/23/20   [provider]  hydrALAZINE (APRESOLINE) 50 MG tablet Take 1 tablet (50 mg total) by mouth 3 (three) times daily. 02/13/22   Minna Merritts, MD  Multiple Vitamin (MULTIVITAMIN WITH MINERALS) TABS tablet Take 1 tablet by mouth daily.    [provider]  rosuvastatin (CRESTOR) 5 MG tablet Take 5 mg by mouth daily. 02/23/20   [provider]  TIROSINT 125 MCG CAPS Take 125 mcg by mouth daily. 10/22/18   [provider]    Inpatient Medications: Scheduled Meds:  [START ON 03/09/2022] levothyroxine  125 mcg Oral Q0600   sodium chloride flush  3 mL Intravenous Q12H   Continuous Infusions:  [START ON 03/09/2022] ceFEPime (MAXIPIME) IV     diltiazem (CARDIZEM) infusion 5 mg/hr (03/08/22 1854)   lactated ringers     [START ON 03/09/2022] metronidazole     PRN Meds: acetaminophen **OR** acetaminophen, hydrALAZINE, ondansetron **OR** ondansetron (ZOFRAN) IV, oxyCODONE  Allergies:    Allergies  Allergen Reactions   No Known Allergies     Social History:   Social History   Socioeconomic  History   Marital status: Widowed    Spouse name: Not on file   Number of children: Not on file   Years of education: Not on file   Highest education level: Not on file  Occupational History   Not on file  Tobacco Use   Smoking status: Never   Smokeless tobacco: Never  Vaping Use   Vaping Use: Never used  Substance and Sexual Activity   Alcohol use: Never    Comment: 1 glass of wine weekly   Drug use: No   Sexual activity: Not on file  Other Topics Concern   Not on file  Social History Narrative   ** Merged History Encounter **       Drinks 1-2 cups of caffeine daily.   Social Determinants of Health   Financial Resource Strain:  Not on file  Food Insecurity: Not on file  Transportation Needs: Not on file  Physical Activity: Not on file  Stress: Not on file  Social Connections: Not on file  Intimate Partner Violence: Not on file    Family History:    Family History  Problem Relation Age of Onset   Hypertension Mother    Thyroid disease Mother    Stroke Mother    Kidney disease Father    Diabetes Father      ROS:  Please see the history of present illness.   All other ROS reviewed and negative.     Physical Exam/Data:   Vitals:   03/08/22 1815 03/08/22 1945 03/08/22 1950 03/08/22 2049  BP: (!) 126/47 (!) 112/53 113/77 108/66  Pulse: (!) 111 (!) 103 (!) 101 (!) 119  Resp: '20 15 20 20  '$ Temp:   98.3 F (36.8 C) 97.9 F (36.6 C)  TempSrc:   Oral Oral  SpO2: 95% 93% 95% 96%  Weight:    103 kg  Height:    '5\' 5"'$  (1.651 m)    Intake/Output Summary (Last 24 hours) at 03/08/2022 2119 Last data filed at 03/08/2022 2006 Gross per 24 hour  Intake 751.3 ml  Output 290 ml  Net 461.3 ml      03/08/2022    8:49 PM 03/08/2022    1:53 PM 12/22/2021   10:42 AM  Last 3 Weights  Weight (lbs) 227 lb 1.2 oz 225 lb 240 lb 8 oz  Weight (kg) 103 kg 102.059 kg 109.09 kg     Body mass index is 37.79 kg/m.  General:  Well nourished, well developed, in no acute distress HEENT: normal Neck: no JVD Vascular: No carotid bruits; Distal pulses 2+ bilaterally Cardiac:  normal S1, S2; irreg irreg; no murmur  Lungs:  clear to auscultation bilaterally, no wheezing, rhonchi or rales  Abd: soft, nontender, no hepatomegaly  Ext: no edema Musculoskeletal:  No deformities Skin: warm and dry  Neuro:  no focal abnormalities noted Psych:  Normal affect   EKG:  The EKG was personally reviewed and demonstrates:  AF w/ HR 141, LVH by voltage  Telemetry:  Telemetry was personally reviewed and demonstrates:  AF w/ HR 130s  Relevant CV Studies: TTE 10-28-18   1. The left ventricle has normal systolic function, with an  ejection  fraction of 55-60%. The cavity size was normal. Left ventricular diastolic  parameters were normal.   2. The right ventricle has normal systolic function. The cavity was  normal. There is no increase in right ventricular wall thickness.    12-22-21 NMST Pharmacological myocardial perfusion imaging study with no significant  ischemia Normal wall motion, EF estimated at 59% No EKG changes concerning for ischemia at peak stress or in recovery. CT attenuation correction images with no significant coronary calcification, minimal aortic atherosclerosis Low risk scan  Laboratory Data:  High Sensitivity Troponin:  No results for input(s): "TROPONINIHS" in the last 720 hours.   Chemistry Recent Labs  Lab 03/08/22 1404  NA 133*  K 4.5  CL 100  CO2 18*  GLUCOSE 121*  BUN 49*  CREATININE 2.71*  CALCIUM 9.4  MG 1.6*  GFRNONAA 18*  ANIONGAP 15    Recent Labs  Lab 03/08/22 1404  PROT 7.9  ALBUMIN 4.1  AST 16  ALT 33  ALKPHOS 271*  BILITOT 0.9   Lipids No results for input(s): "CHOL", "TRIG", "HDL", "LABVLDL", "LDLCALC", "CHOLHDL" in the last 168 hours.  Hematology Recent Labs  Lab 03/08/22 1404  WBC 15.9*  RBC 4.03  HGB 11.0*  HCT 35.1*  MCV 87.1  MCH 27.3  MCHC 31.3  RDW 13.8  PLT 416*   Thyroid  Recent Labs  Lab 03/08/22 1433  TSH 0.241*    BNP Recent Labs  Lab 03/08/22 1404  BNP 180.3*    DDimer No results for input(s): "DDIMER" in the last 168 hours.   Radiology/Studies:  CT CHEST ABDOMEN PELVIS WO CONTRAST  Result Date: 03/08/2022 CLINICAL DATA:  Right upper quadrant pain, recent per chole tube. EXAM: CT CHEST, ABDOMEN AND PELVIS WITHOUT CONTRAST TECHNIQUE: Multidetector CT imaging of the chest, abdomen and pelvis was performed following the standard protocol without IV contrast. RADIATION DOSE REDUCTION: This exam was performed according to the departmental dose-optimization program which includes automated exposure control, adjustment of  the mA and/or kV according to patient size and/or use of iterative reconstruction technique. COMPARISON:  None Available. FINDINGS: CT CHEST FINDINGS Cardiovascular: Normal heart size. No pericardial effusion. Normal caliber thoracic aorta with mild calcified plaque. No visible coronary artery calcifications. Mediastinum/Nodes: Small hiatal hernia. No pathologically enlarged lymph nodes seen in the chest Lungs/Pleura: Central airways are patent small right pleural effusion and right basilar atelectasis. No consolidation, pleural effusion or pneumothorax Musculoskeletal: No chest wall mass or suspicious bone lesions identified. CT ABDOMEN PELVIS FINDINGS Hepatobiliary: Small subcapsular collection located adjacent to the percutaneous biliary drain measuring 5.3 x 1.3 cm. Decompressed gallbladder with gallstones evidence of biliary ductal dilation. Common bile duct drain in place. No evidence of biliary ductal dilation. Pancreas: Unremarkable. No pancreatic ductal dilatation or surrounding inflammatory changes. Spleen: Normal in size without focal abnormality. Adrenals/Urinary Tract: Bilateral adrenal glands are unremarkable. No hydronephrosis or nephrolithiasis. Bladder is unremarkable. Stomach/Bowel: Postsurgical changes of the stomach. No bowel wall thickening, inflammatory change or evidence of obstruction. Normal appendix Vascular/Lymphatic: Aortic atherosclerosis. No enlarged abdominal or pelvic lymph nodes. Reproductive: Uterus and bilateral adnexa are unremarkable. Other: Trace high-density free fluid seen in the pelvis. Musculoskeletal: No acute or significant osseous findings. IMPRESSION: 1. Small subcapsular collection located adjacent to the percutaneous biliary drain, likely postprocedural hematoma or biloma. HIDA scan could assist with further evaluation. 2. Small right pleural effusion and right basilar atelectasis. 3. Trace high-density free fluid seen in the pelvis, likely due to small volume  hemoperitoneum related to recent procedure. 4. Aortic Atherosclerosis (ICD10-I70.0). Electronically Signed   By: Yetta Glassman M.D.   On: 03/08/2022 15:48     Assessment and Plan:   New onset atrial fibrillation [facility]) Patient presenting with new-onset afib.  Etiology is thought to be related to infection and/or AKI HR remains 120s-130s on  dilt gtt @ 7.'5mg'$ /hr. pt has been on metoprolol prior to admission. BP is low 100s, so up-titration of dilt may be limited by low BP. I would suggest trying metoprolol to see if it is any more effective at rate control; she was on '100mg'$  tartrate bid at home. Could try toprol XL '100mg'$  bid, first dose now.  CHA2DS2-VASc Score is >2 and so patient would benefit from oral anticoagulation, however given possibility of  possible subcapsular hematoma, would hold [place] at present. DCCV would probably be helpful, but may not be an option in the short term as she is currently unable to be anti-coagulated.  -No h/o CAD, HF or significant valvular pathology. Woulld keep NPO overnight in case surgery gives the OK for anti-coagulation, in which case DCCV may be a more viable option.   Risk Assessment/Risk Scores:   For questions or updates, please contact [place] Please consult [email or web address] for contact info under    Signed, Rudean Curt, MD, [facility] 03/08/2022 9:19 PM

## 2022-03-08 NOTE — Assessment & Plan Note (Signed)
-  Continue Synthroid at current dose for now  -Her TSH is suppressed and so it may be reasonable to decrease her Synthroid dose

## 2022-03-08 NOTE — ED Provider Notes (Signed)
[facility] EMERGENCY DEPT Provider Note   CSN: [id number] Arrival date & time: 03/08/22  1345     History  Chief Complaint  Patient presents with   Atrial Fibrillation    Lynn Joseph is a 76 y.o. female.  76 year old female with a history of HFpEF on metoprolol, hypertension, hypothyroidism on levothyroxine, and recent diagnosis of cholecystitis status post percutaneous cholecystostomy tube on 11/8 who presents to the emergency department with palpitations.  Since having her procedure done at [facility] on 11/8 when she had internal and external biliary drains placed she says that she has had increased fatigue.  Has felt more fatigued than usual recently.  Says that today she started having sensation of palpitations and checked her watch and noticed that her heart rate was elevated despite taking her metoprolol this morning.  Not on blood thinners and does not have a history of atrial fibrillation.  She then presented to the emergency department for evaluation.  Says that she does have right-sided pain near the cystostomy site but denies any drainage.  No fevers.  Has had fatigue and dyspnea on exertion.  Also says that she has chest pain radiating up her right side from the cholecystectomy site.  Was discharged home with Augmentin which she has been taking and reports having diarrhea over the past several days.       Home Medications Prior to Admission medications   Medication Sig Start Date End Date Taking? Authorizing Provider  Calcium Carbonate-Vitamin D (CALCIUM 600+D HIGH POTENCY) 600-400 MG-UNIT tablet Take 1 tablet by mouth daily.    [provider]  carvedilol (COREG) 25 MG tablet Take 1 tablet (25 mg total) by mouth 2 (two) times daily. 02/13/22   Minna Merritts, MD  gabapentin (NEURONTIN) 100 MG capsule Take 100 mg by mouth 3 times/day as needed-between meals & bedtime. 02/23/20   [provider]  gabapentin  (NEURONTIN) 300 MG capsule Take 1 capsule (300 mg total) by mouth at bedtime. Patient not taking: Reported on 12/22/2021 01/18/20   Mcarthur Rossetti, MD  hydrALAZINE (APRESOLINE) 50 MG tablet Take 1 tablet (50 mg total) by mouth 3 (three) times daily. 02/13/22   Minna Merritts, MD  Multiple Vitamin (MULTIVITAMIN WITH MINERALS) TABS tablet Take 1 tablet by mouth daily.    [provider]  rosuvastatin (CRESTOR) 5 MG tablet Take 5 mg by mouth daily. 02/23/20   [provider]  TIROSINT 125 MCG CAPS Take 125 mcg by mouth daily. 10/22/18   [provider]  triamterene-hydrochlorothiazide (DYAZIDE) 37.5-25 MG capsule Take 1 capsule by mouth daily. 08/07/18   [provider]      Allergies    No known allergies    Review of Systems   Review of Systems  Physical Exam Updated Vital Signs BP 111/71   Pulse (!) 109   Temp 97.9 F (36.6 C)   Resp 20   Ht '5\' 5"'$  (1.651 m)   Wt 102.1 kg   SpO2 94%   BMI 37.44 kg/m  Physical Exam Vitals and nursing note reviewed.  Constitutional:      General: She is not in acute distress.    Appearance: She is well-developed.  HENT:     Head: Normocephalic and atraumatic.     Right Ear: External ear normal.     Left Ear: External ear normal.     Nose: Nose normal.  Eyes:     Extraocular Movements: Extraocular movements intact.  Conjunctiva/sclera: Conjunctivae normal.     Pupils: Pupils are equal, round, and reactive to light.  Cardiovascular:     Rate and Rhythm: Tachycardia present. Rhythm irregular.     Heart sounds: No murmur heard. Pulmonary:     Effort: Pulmonary effort is normal. No respiratory distress.     Breath sounds: Normal breath sounds.  Abdominal:     General: Abdomen is flat. There is no distension.     Palpations: Abdomen is soft. There is no mass.     Tenderness: There is abdominal tenderness (Right upper quadrant). There is no guarding.     Comments: Cholecystostomy drainage bag with  bile present  Musculoskeletal:        General: No swelling.     Cervical back: Normal range of motion and neck supple.     Right lower leg: No edema.     Left lower leg: No edema.  Skin:    General: Skin is warm and dry.     Capillary Refill: Capillary refill takes less than 2 seconds.  Neurological:     Mental Status: She is alert and oriented to person, place, and time. Mental status is at baseline.  Psychiatric:        Mood and Affect: Mood normal.     ED Results / Procedures / Treatments   Labs (all labs ordered are listed, but only abnormal results are displayed) Labs Reviewed  CBC WITH DIFFERENTIAL/PLATELET - Abnormal; Notable for the following components:      Result Value   WBC 15.9 (*)    Hemoglobin 11.0 (*)    HCT 35.1 (*)    Platelets 416 (*)    Neutro Abs 12.6 (*)    Monocytes Absolute 1.6 (*)    Abs Immature Granulocytes 0.17 (*)    All other components within normal limits  COMPREHENSIVE METABOLIC PANEL - Abnormal; Notable for the following components:   Sodium 133 (*)    CO2 18 (*)    Glucose, Bld 121 (*)    BUN 49 (*)    Creatinine, Ser 2.71 (*)    Alkaline Phosphatase 271 (*)    GFR, Estimated 18 (*)    All other components within normal limits  TSH - Abnormal; Notable for the following components:   TSH 0.241 (*)    All other components within normal limits  BRAIN NATRIURETIC PEPTIDE - Abnormal; Notable for the following components:   B Natriuretic Peptide 180.3 (*)    All other components within normal limits  MAGNESIUM - Abnormal; Notable for the following components:   Magnesium 1.6 (*)    All other components within normal limits  CULTURE, BLOOD (SINGLE)  LACTIC ACID, PLASMA  LACTIC ACID, PLASMA    EKG EKG Interpretation  Date/Time:  Thursday March 08 2022 13:54:41 EST Ventricular Rate:  141 PR Interval:    QRS Duration: 82 QT Interval:  306 QTC Calculation: 468 R Axis:   24 Text Interpretation: Atrial fibrillation with rapid  ventricular response Minimal voltage criteria for LVH, may be normal variant ( R in aVL ) Marked ST abnormality, possible inferior subendocardial injury Abnormal ECG No previous ECGs available Confirmed by Margaretmary Eddy [id number]) on 03/08/2022 2:01:45 PM  Radiology CT CHEST ABDOMEN PELVIS WO CONTRAST  Result Date: 03/08/2022 CLINICAL DATA:  Right upper quadrant pain, recent per chole tube. EXAM: CT CHEST, ABDOMEN AND PELVIS WITHOUT CONTRAST TECHNIQUE: Multidetector CT imaging of the chest, abdomen and pelvis was performed following the standard protocol without IV contrast.  RADIATION DOSE REDUCTION: This exam was performed according to the departmental dose-optimization program which includes automated exposure control, adjustment of the mA and/or kV according to patient size and/or use of iterative reconstruction technique. COMPARISON:  None Available. FINDINGS: CT CHEST FINDINGS Cardiovascular: Normal heart size. No pericardial effusion. Normal caliber thoracic aorta with mild calcified plaque. No visible coronary artery calcifications. Mediastinum/Nodes: Small hiatal hernia. No pathologically enlarged lymph nodes seen in the chest Lungs/Pleura: Central airways are patent small right pleural effusion and right basilar atelectasis. No consolidation, pleural effusion or pneumothorax Musculoskeletal: No chest wall mass or suspicious bone lesions identified. CT ABDOMEN PELVIS FINDINGS Hepatobiliary: Small subcapsular collection located adjacent to the percutaneous biliary drain measuring 5.3 x 1.3 cm. Decompressed gallbladder with gallstones evidence of biliary ductal dilation. Common bile duct drain in place. No evidence of biliary ductal dilation. Pancreas: Unremarkable. No pancreatic ductal dilatation or surrounding inflammatory changes. Spleen: Normal in size without focal abnormality. Adrenals/Urinary Tract: Bilateral adrenal glands are unremarkable. No hydronephrosis or nephrolithiasis. Bladder is  unremarkable. Stomach/Bowel: Postsurgical changes of the stomach. No bowel wall thickening, inflammatory change or evidence of obstruction. Normal appendix Vascular/Lymphatic: Aortic atherosclerosis. No enlarged abdominal or pelvic lymph nodes. Reproductive: Uterus and bilateral adnexa are unremarkable. Other: Trace high-density free fluid seen in the pelvis. Musculoskeletal: No acute or significant osseous findings. IMPRESSION: 1. Small subcapsular collection located adjacent to the percutaneous biliary drain, likely postprocedural hematoma or biloma. HIDA scan could assist with further evaluation. 2. Small right pleural effusion and right basilar atelectasis. 3. Trace high-density free fluid seen in the pelvis, likely due to small volume hemoperitoneum related to recent procedure. 4. Aortic Atherosclerosis (ICD10-I70.0). Electronically Signed   By: Yetta Glassman M.D.   On: 03/08/2022 15:48    Procedures Procedures   Medications Ordered in ED Medications  diltiazem (CARDIZEM) 125 mg in dextrose 5% 125 mL (1 mg/mL) infusion (7.5 mg/hr Intravenous Rate/Dose Change 03/08/22 1540)  ceFEPIme (MAXIPIME) 2 g in sodium chloride 0.9 % 100 mL IVPB (has no administration in time range)  0.9 %  sodium chloride infusion (0 mLs Intravenous Stopped 03/08/22 1603)  sodium chloride 0.9 % bolus 500 mL (0 mLs Intravenous Stopped 03/08/22 1717)  ceFEPIme (MAXIPIME) 2 g in sodium chloride 0.9 % 100 mL IVPB (0 g Intravenous Stopped 03/08/22 1635)  metroNIDAZOLE (FLAGYL) IVPB 500 mg (0 mg Intravenous Stopped 03/08/22 1750)  magnesium sulfate IVPB 2 g 50 mL (0 g Intravenous Stopped 03/08/22 1750)    ED Course/ Medical Decision Making/ A&P Clinical Course as of 03/08/22 1826  Thu Mar 08, 2022  1604 Dr Gasper Sells from cardiology was contacted.  Recommends continuing current plan for her rate control.  Recommends withholding anticoagulation at this time with her postoperative hematoma that was noted. [RP]  0998  Spoke with Saverio Danker from trauma surgery.  States that the patient can have her percutaneous cholecystostomy tube in place for an indefinite period of time and I will not require cholecystectomy this admission.  States that she can follow-up as an outpatient with Dr. Harlow Asa after her atrial fibrillation with RVR is under control.   [RP]  1652 Dr Lorin Mercy has accepted the patient to [facility].  [RP]    Clinical Course User Index [RP] Fransico Meadow, MD                           Medical Decision Making Amount and/or Complexity of Data Reviewed Labs: ordered. Radiology: ordered.  Risk  Prescription drug management. Decision regarding hospitalization.   TWILLA KHOURI is a 76 y.o. female with comorbidities that complicate the patient evaluation including HFpEF on metoprolol, hypertension, hypothyroidism on levothyroxine, and recent diagnosis of cholecystitis status post percutaneous cholecystostomy tube on 11/8 who presents to the emergency department with palpitations and diarrhea in the setting of recent percutaneous cholecystostomy.  This patient presents to the ED for concern of complaints listed in HPI, this involves an extensive number of treatment options, and is a complaint that carries with it a high risk of complications and morbidity. Disposition including potential need for admission considered.   Initial Ddx:  Atrial fibrillation RVR, dehydration, sepsis, bile leak, PE, MI, hyperthyroidism  MDM:  Patient peers to be in new onset atrial fibrillation with RVR at this time.  Could be due to idiopathic atrial fibrillation but does also have possible secondary causes such as dehydration from her diarrhea, sepsis due to infection of her gallbladder, or hyperthyroidism.  Did consider PE so will obtain contrasted scan today if possible especially with her dyspnea on exertion and recent surgery.  Considered MI but feel it is less likely with her atrial fibrillation RVR other  symptoms.  With her history of heart failure and soft blood pressures will start the patient on diltiazem drip without a bolus and give fluids.  Plan:  Labs Electrolytes TSH EKG IV fluids Diltiazem  ED Summary/Re-evaluation:  Patient reassessed and heart rate had improved after fluids and diltiazem drip was started.  It was increased to a rate of 7.5 mg an hour.  Labs did show AKI and patient was given an additional 500 mL bolus of fluids.  Had leukocytosis and with her tachycardia did meet sepsis criteria so was initiated on cefepime and Flagyl.  Initially ordered CT scans with contrast but due to her renal function they had to be changed to a dry CT scan which showed small subcapsular collection concerning for possible postoperative hematoma.  Discussed with general surgery and cardiology.  See ED course further recommendations.  Patient was then admitted to hospitalist for further management.  Dispo: Admit to Step Down   Additional history obtained from  family friend Records reviewed Outpatient Clinic Notes The following labs were independently interpreted: Chemistry and show AKI I personally reviewed and interpreted cardiac monitoring: atrial fibrillation with RVR I personally reviewed and interpreted the pt's EKG: see above for interpretation  I have reviewed the patients home medications and made adjustments as needed Consults: Cardiology, Hospitalist, and General Surgery   Final Clinical Impression(s) / ED Diagnoses Final diagnoses:  Atrial fibrillation with RVR ([facility])  AKI (acute kidney injury) [facility])    Rx / [facility] Orders ED Discharge Orders     None         Fransico Meadow, MD 03/08/22 1826

## 2022-03-08 NOTE — ED Triage Notes (Signed)
Pt arrives to ED with c/o palpations that's started this morning. Associated with SOB. She reports she needs a cholecystectomy, last week had her common bile duct cleaned out. Has drain to right abdomen. From [place]. No hc Afib.

## 2022-03-08 NOTE — Assessment & Plan Note (Signed)
-  Patient presenting with new-onset afib.  -Etiology is thought to be related to infection and/or AKI -Will admit to SDU for Diltiazem drip as per protocol with plan to transition to PO Diltiazem once heart rate is controlled (resting HR 110 or lower); patient needs admission based on afib associated with a high-risk situation (acute cholecystitis). -Will request Echocardiogram for further evaluation  -EDP consulted cardiology and I have sent an in-box message asking that she be seen in the AM.  -Heart rate is better controlled with Dilt drip. -CHA2DS2-VASc Score is >2 and so patient would benefit from oral anticoagulation. There is evidence of net benefit even in the extremely elderly population. -Cardiology has recommended holding [facility] for now given a possible subcapsular hematoma.

## 2022-03-08 NOTE — H&P (Addendum)
PCP:   Pcp, No   Chief Complaint:    HPI: This is a 76 year old female with recent complex past medical history. She has a past medical history of hypertension [poor control, suspect renovascular disease], hypothyroidism, symptomatic gallstones  Patient has a residence in [place], mid-October she drove to visit.  She was ill the entire ride and approximately 10 days total with what appeared to be a cold.  Additionally around the same time her medications of metoprolol and hydralazine was changed by her cardiologist, to Coreg with an increase in the dose of hydralazine.  Patient had an adverse reaction to Coreg, this was discontinued and metoprolol resume.  As was also hypotensive her hydralazine original dosing of 50 mg twice daily was resumed.  This episode resulted in patient with a brief hospitalization.  The patient felt okay until Monday, May 6 when she started feeling poorly again.  She developed significant abdominal pain and pressure that never passed.  She returned to the ER.  At that time she was diagnosed with blocked bile ducts and gallstones.  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  Because of this surgery she was unable to get the traditional ERCP.  MRCP was done with an alternative approach through the liver.  A biliary drain was placed.  Patient discharged with instruction to follow-up with surgery for outpatient cholecystectomy.  Patient return home to [place].  She has an appointment with a surgeon next week.  The biliary drain output is consistent at approximately 800 cc/day of bilious fluids.  Since being home patient continues to feel weak, puny, sluggish.  She has not been eating or drinking.  She has lost 15 pounds over this time.   This a.m. she developed fluttering in her chest.  She was taken to the ER where she was diagnosed with A-fib with RVR. She was seen by telemetry hospitalist Dr. Lorin Mercy and transferred to [facility] by direct admission.  The patient denies fever,  chills, nausea, vomiting or abdominal pain currently.  In the ER she was placed on diltiazem drip 7.5 mcg/h.  In the room patient's heart rate ranges between 111 to 125.  Cardiology consult has been placed.  History per patient and her daughter who is present at bedside.  Review of Systems:  Per HPI  Past Medical History: Past Medical History:  Diagnosis Date   Anemia of chronic disease    CHF (congestive heart failure) (HCC)    Chronic diarrhea    Glaucoma    HTN (hypertension)    Hypertension    Hypothyroid    Obesity    Skin cancer    Past Surgical History:  Procedure Laterality Date   GASTRIC BYPASS     GASTRIC RESTRICTION SURGERY     TUBAL LIGATION      Medications: Prior to Admission medications   Medication Sig Start Date End Date Taking? Authorizing Provider  Calcium Carbonate-Vitamin D (CALCIUM 600+D HIGH POTENCY) 600-400 MG-UNIT tablet Take 1 tablet by mouth daily.    [provider]  gabapentin (NEURONTIN) 100 MG capsule Take 100 mg by mouth 3 times/day as needed-between meals & bedtime. 02/23/20   [provider]  hydrALAZINE (APRESOLINE) 50 MG tablet Take 1 tablet (50 mg total) by mouth 3 (three) times daily. 02/13/22   Minna Merritts, MD  Multiple Vitamin (MULTIVITAMIN WITH MINERALS) TABS tablet Take 1 tablet by mouth daily.    [provider]  rosuvastatin (CRESTOR) 5 MG tablet Take 5 mg by  mouth daily. 02/23/20   [provider]  TIROSINT 125 MCG CAPS Take 125 mcg by mouth daily. 10/22/18   [provider]    Allergies:   Allergies  Allergen Reactions   No Known Allergies     Social History:  reports that she has never smoked. She has never used smokeless tobacco. She reports that she does not drink alcohol and does not use drugs.  Family History: Family History  Problem Relation Age of Onset   Hypertension Mother    Thyroid disease Mother    Stroke Mother    Kidney disease Father    Diabetes Father      Physical Exam: Vitals:   03/08/22 1815 03/08/22 1945 03/08/22 1950 03/08/22 2049  BP: (!) 126/47 (!) 112/53 113/77 108/66  Pulse: (!) 111 (!) 103 (!) 101 (!) 119  Resp: '20 15 20 20  '$ Temp:   98.3 F (36.8 C) 97.9 F (36.6 C)  TempSrc:   Oral Oral  SpO2: 95% 93% 95% 96%  Weight:    103 kg  Height:    '5\' 5"'$  (1.651 m)    General:  Alert and oriented times three, well developed and nourished, no acute distress, weak Eyes: PERRLA, pink conjunctiva, no scleral icterus ENT: Moist oral mucosa, neck supple, no thyromegaly Lungs: clear to ascultation, no wheeze, no crackles, no use of accessory muscles Cardiovascular: irregularly irregular rythmn, no regurgitation, no gallops, no murmurs. No carotid bruits, no JVD Abdomen: soft, positive BS, non-tender, non-distended, no organomegaly, drsin in place. not an acute abdomen GU: not examined Neuro: CN II - XII grossly intact, sensation intact Musculoskeletal: strength 5/5 all extremities, no clubbing, cyanosis or edema Skin: no rash, no subcutaneous crepitation, no decubitus Psych: appropriate patient   Labs on Admission:  Recent Labs    03/08/22 1404  NA 133*  K 4.5  CL 100  CO2 18*  GLUCOSE 121*  BUN 49*  CREATININE 2.71*  CALCIUM 9.4  MG 1.6*   Recent Labs    03/08/22 1404  AST 16  ALT 33  ALKPHOS 271*  BILITOT 0.9  PROT 7.9  ALBUMIN 4.1    Recent Labs    03/08/22 1404  WBC 15.9*  NEUTROABS 12.6*  HGB 11.0*  HCT 35.1*  MCV 87.1  PLT 416*    Recent Labs    03/08/22 1433  TSH 0.241*     Radiological Exams on Admission: CT CHEST ABDOMEN PELVIS WO CONTRAST  Result Date: 03/08/2022 CLINICAL DATA:  Right upper quadrant pain, recent per chole tube. EXAM: CT CHEST, ABDOMEN AND PELVIS WITHOUT CONTRAST TECHNIQUE: Multidetector CT imaging of the chest, abdomen and pelvis was performed following the standard protocol without IV contrast. RADIATION DOSE REDUCTION: This exam was performed according to the  departmental dose-optimization program which includes automated exposure control, adjustment of the mA and/or kV according to patient size and/or use of iterative reconstruction technique. COMPARISON:  None Available. FINDINGS: CT CHEST FINDINGS Cardiovascular: Normal heart size. No pericardial effusion. Normal caliber thoracic aorta with mild calcified plaque. No visible coronary artery calcifications. Mediastinum/Nodes: Small hiatal hernia. No pathologically enlarged lymph nodes seen in the chest Lungs/Pleura: Central airways are patent small right pleural effusion and right basilar atelectasis. No consolidation, pleural effusion or pneumothorax Musculoskeletal: No chest wall mass or suspicious bone lesions identified. CT ABDOMEN PELVIS FINDINGS Hepatobiliary: Small subcapsular collection located adjacent to the percutaneous biliary drain measuring 5.3 x 1.3 cm. Decompressed gallbladder with gallstones evidence of biliary ductal dilation. Common bile  duct drain in place. No evidence of biliary ductal dilation. Pancreas: Unremarkable. No pancreatic ductal dilatation or surrounding inflammatory changes. Spleen: Normal in size without focal abnormality. Adrenals/Urinary Tract: Bilateral adrenal glands are unremarkable. No hydronephrosis or nephrolithiasis. Bladder is unremarkable. Stomach/Bowel: Postsurgical changes of the stomach. No bowel wall thickening, inflammatory change or evidence of obstruction. Normal appendix Vascular/Lymphatic: Aortic atherosclerosis. No enlarged abdominal or pelvic lymph nodes. Reproductive: Uterus and bilateral adnexa are unremarkable. Other: Trace high-density free fluid seen in the pelvis. Musculoskeletal: No acute or significant osseous findings. IMPRESSION: 1. Small subcapsular collection located adjacent to the percutaneous biliary drain, likely postprocedural hematoma or biloma. HIDA scan could assist with further evaluation. 2. Small right pleural effusion and right basilar  atelectasis. 3. Trace high-density free fluid seen in the pelvis, likely due to small volume hemoperitoneum related to recent procedure. 4. Aortic Atherosclerosis (ICD10-I70.0). Electronically Signed   By: Yetta Glassman M.D.   On: 03/08/2022 15:48    Assessment/Plan Present on Admission:  New onset atrial fibrillation w RVR(HCC) -Admit to med telemetry -Cardiologist at beside, rec d/c dilt drip, and initiating metoprolol XL'100mg'$  daily.  Cardiology consult appreciated. -50 mg PO metoprolol twice daily initiated.  Patient's most recent blood pressure 108/64.  Normal saline bolus 500 cc ordered.  LR due to continued.  Will titrate up PO metoprolol as blood pressure allows.  We will wean diltiazem drip. -Eliquis not ordered as CT abdomen/pelvis shows possible postprocedural hematoma -N.p.o. at midnight -2D echo in a.m. -Atrial fibrillation likely secondary to ongoing illness.  Patient ever so mildly hypothyroid.   Acute cholecystitis/biliary drain -Surgery consult per a.m. team -Currently n.p.o.   Acute on chronic kidney disease/metabolic acidosis -Creatinine 2.71, baseline 1.6 [10/29/2021] -IV fluid hydration, BMP in a.m.   Hypothyroidism -Tirosint 125 mg p.o. daily.  TSH low at 0.241.  Free T4 mildly elevated at 1.17 (0.61-1.12).  This is unlikely to be causing patient's atrial fibrillation, however, culture sent 24 to 48 hours   Essential hypertension -For now HCTZ, Norvasc and hydralazine on hold -Continue titrate Dilt drip off and titrate increase of p.o. metoprolol dosing to rate control  Marlen Mollica 03/08/2022, 9:30 PM

## 2022-03-08 NOTE — Assessment & Plan Note (Signed)
-  Records are not currently available but her daughter does have records with her -The patient appears to have had acute cholecystitis/choledocholithiasis while in [place] -Given her h/o gastric bypass, they were unable to do ERCP and so based on MRCP placed a biliary drain -She was recommended to have cholecystectomy but this was deferred until she came back to [facility] -She had a visit today where she was scheduled to see surgery, but this was missed when she had to come to the ER instead -Surgery will plan to consult tomorrow; if issues develop overnight requiring more acute intervention, [facility] can call for her to be seen sooner  -In the meantime, will continue antibiotics but transition to IV -Will allow her to eat but make her NPO after MN

## 2022-03-08 NOTE — ED Notes (Signed)
Called [facility] and spoke to [place]; patient Bed Ready for transport

## 2022-03-09 ENCOUNTER — Inpatient Hospital Stay (HOSPITAL_COMMUNITY): Payer: Medicare Other

## 2022-03-09 ENCOUNTER — Encounter (HOSPITAL_COMMUNITY): Payer: Self-pay | Admitting: Anesthesiology

## 2022-03-09 ENCOUNTER — Other Ambulatory Visit (HOSPITAL_COMMUNITY): Payer: Self-pay

## 2022-03-09 DIAGNOSIS — I4891 Unspecified atrial fibrillation: Secondary | ICD-10-CM

## 2022-03-09 HISTORY — PX: IR SINUS/FIST TUBE CHK-NON GI: IMG673

## 2022-03-09 LAB — CBC WITH DIFFERENTIAL/PLATELET
Abs Immature Granulocytes: 0.11 10*3/uL — ABNORMAL HIGH (ref 0.00–0.07)
Basophils Absolute: 0 10*3/uL (ref 0.0–0.1)
Basophils Relative: 0 %
Eosinophils Absolute: 0.1 10*3/uL (ref 0.0–0.5)
Eosinophils Relative: 1 %
HCT: 34.2 % — ABNORMAL LOW (ref 36.0–46.0)
Hemoglobin: 11.2 g/dL — ABNORMAL LOW (ref 12.0–15.0)
Immature Granulocytes: 1 %
Lymphocytes Relative: 9 %
Lymphs Abs: 1 10*3/uL (ref 0.7–4.0)
MCH: 28.1 pg (ref 26.0–34.0)
MCHC: 32.7 g/dL (ref 30.0–36.0)
MCV: 85.9 fL (ref 80.0–100.0)
Monocytes Absolute: 1 10*3/uL (ref 0.1–1.0)
Monocytes Relative: 9 %
Neutro Abs: 8.9 10*3/uL — ABNORMAL HIGH (ref 1.7–7.7)
Neutrophils Relative %: 80 %
Platelets: 334 10*3/uL (ref 150–400)
RBC: 3.98 MIL/uL (ref 3.87–5.11)
RDW: 13.8 % (ref 11.5–15.5)
WBC: 11.2 10*3/uL — ABNORMAL HIGH (ref 4.0–10.5)
nRBC: 0 % (ref 0.0–0.2)

## 2022-03-09 LAB — URINALYSIS, ROUTINE W REFLEX MICROSCOPIC
Bacteria, UA: NONE SEEN
Bilirubin Urine: NEGATIVE
Glucose, UA: NEGATIVE mg/dL
Hgb urine dipstick: NEGATIVE
Ketones, ur: NEGATIVE mg/dL
Nitrite: NEGATIVE
Protein, ur: NEGATIVE mg/dL
Specific Gravity, Urine: 1.009 (ref 1.005–1.030)
pH: 5 (ref 5.0–8.0)

## 2022-03-09 LAB — ECHOCARDIOGRAM COMPLETE
AR max vel: 2.49 cm2
AV Area VTI: 2.59 cm2
AV Area mean vel: 2.35 cm2
AV Mean grad: 4 mmHg
AV Peak grad: 6.4 mmHg
Ao pk vel: 1.26 m/s
Height: 65 in
S' Lateral: 2.7 cm
Weight: 3633.18 oz

## 2022-03-09 LAB — COMPREHENSIVE METABOLIC PANEL
ALT: 35 U/L (ref 0–44)
AST: 21 U/L (ref 15–41)
Albumin: 3.1 g/dL — ABNORMAL LOW (ref 3.5–5.0)
Alkaline Phosphatase: 277 U/L — ABNORMAL HIGH (ref 38–126)
Anion gap: 15 (ref 5–15)
BUN: 41 mg/dL — ABNORMAL HIGH (ref 8–23)
CO2: 15 mmol/L — ABNORMAL LOW (ref 22–32)
Calcium: 9 mg/dL (ref 8.9–10.3)
Chloride: 109 mmol/L (ref 98–111)
Creatinine, Ser: 2.27 mg/dL — ABNORMAL HIGH (ref 0.44–1.00)
GFR, Estimated: 22 mL/min — ABNORMAL LOW (ref >60)
Glucose, Bld: 97 mg/dL (ref 70–99)
Potassium: 4.1 mmol/L (ref 3.5–5.1)
Sodium: 139 mmol/L (ref 135–145)
Total Bilirubin: 0.8 mg/dL (ref 0.3–1.2)
Total Protein: 7 g/dL (ref 6.5–8.1)

## 2022-03-09 LAB — MAGNESIUM: Magnesium: 2.1 mg/dL (ref 1.7–2.4)

## 2022-03-09 LAB — PHOSPHORUS: Phosphorus: 4.4 mg/dL (ref 2.5–4.6)

## 2022-03-09 MED ORDER — METOPROLOL TARTRATE 50 MG PO TABS
50.0000 mg | ORAL_TABLET | Freq: Three times a day (TID) | ORAL | Status: DC
  Administered 2022-03-09 (×3): 50 mg via ORAL
  Filled 2022-03-09 (×3): qty 1

## 2022-03-09 MED ORDER — METOPROLOL TARTRATE 5 MG/5ML IV SOLN
5.0000 mg | INTRAVENOUS | Status: DC | PRN
  Filled 2022-03-09: qty 5

## 2022-03-09 MED ORDER — METOPROLOL TARTRATE 5 MG/5ML IV SOLN
2.5000 mg | INTRAVENOUS | Status: DC | PRN
  Administered 2022-03-09: 2.5 mg via INTRAVENOUS
  Filled 2022-03-09: qty 5

## 2022-03-09 MED ORDER — IOHEXOL 300 MG/ML  SOLN
50.0000 mL | Freq: Once | INTRAMUSCULAR | Status: AC | PRN
  Administered 2022-03-09: 8 mL

## 2022-03-09 NOTE — Progress Notes (Signed)
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  She was recently seen in [place] for CBD obstruction and had perc chole drain placed on 11/8.  Plan was for her to pursue surgery outpatient for cholecystostomy, but she came to ED due to her malaise and new atrial fibrillation.  See below for additional details  Assessment & Plan:   Principal Problem:   New onset atrial fibrillation ([place]) Active Problems:   Obesity   Hypothyroidism   Benign essential hypertension   Acute cholecystitis   AKI (acute kidney injury) ([place])   Assessment and Plan: Common Bile Duct Obstruction s/p Perc Chole Tube CT abdomen pelvis with small subcapsular collection adjacent to percutaneous biliary drain, likely post procedural hematoma or biloma S/p cholangiogram by IR -> catheter well position and normally functioning, patent cystic duct.  Internal/external biliary drainage catheter was capped. Appreciate surgery assistance - plan is for lap chole 11/18 pending risk stratification (appreciate cardiology assistance, "patient low risk for CV issues given recent low risk stress test").  Post op anticoagulation will be determined post op. Cefepime, flagyl  New onset atrial fibrillation with RVR Echo with EF 55-60%, no RWMA TSH is low (she's on tirosint), doubt this is cause of her RVR (would repeat labs in [place] few weeks outside of acute illness, her dose of thyroid meds may need to be decreased) Appreciate cardiology recommendations - metoprolol, diltiazem gtt.  No plans for DCCV with plan for surgery  at this time.  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  Hold HCTZ. Abdominal imaging without hydro UA without protein or RBC's 2.71 at presentation, improving with IVF Will continue IVF at this time, appears euvolemic  Abnormal CT Scan Subcapsular collection adjacent to perc biliary drain - post procedural hematoma or biloma Small right effusion, follow  Suspected small volume hemoperitoneum Continue to monitor  Hypothyroidism Abnormal thyroid function tests in setting of acute illness, would repeat outpatient With low TSH, would wonder if she needs reduced dose of tirosint  Hypertension Metoprolol as above (looks like she takes 100 mg twice daily at home) Doesn't appear she's taking dyazide.  Home hydralazine currently on hold.  Obesity  Hx Roux en y gastric bypass Body mass index is 37.79 kg/m.    DVT prophylaxis: SCD Code Status: full Family Communication: daughter at bedside Disposition:   Status is: Inpatient Remains inpatient appropriate because: continued need for inpatient treatment, surgery   Consultants:  Cardiology surgery  Procedures:  Echo IMPRESSIONS     1. Left ventricular ejection fraction, by estimation, is 55 to 60%. The  left ventricle has normal function. The left ventricle has no regional  wall motion abnormalities. Left ventricular diastolic function could not  be evaluated.   2. Right ventricular systolic function is normal. The right ventricular  size is normal. There is normal pulmonary artery systolic pressure. The  estimated right ventricular systolic pressure is 86.7 mmHg.   3. The mitral valve is grossly normal. Trivial mitral valve  regurgitation. No evidence of mitral stenosis.   4. The aortic valve  is tricuspid. There is mild calcification of the  aortic valve. Aortic valve regurgitation is not visualized. Aortic valve  sclerosis is present, with no evidence of aortic valve stenosis.    5. The inferior vena cava is normal in size with greater than 50%  respiratory variability, suggesting right atrial pressure of 3 mmHg.   Comparison(s): Changes from prior study are noted. EF unchanged. Patient  now in Afib.   Antimicrobials:  Anti-infectives (From admission, onward)    Start     Dose/Rate Route Frequency Ordered Stop   03/09/22 1600  ceFEPIme (MAXIPIME) 2 g in sodium chloride 0.9 % 100 mL IVPB        2 g 200 mL/hr over 30 Minutes Intravenous Every 24 hours 03/08/22 1514     03/09/22 0300  metroNIDAZOLE (FLAGYL) IVPB 500 mg        500 mg 100 mL/hr over 60 Minutes Intravenous  Once 03/08/22 1835 03/09/22 0410   03/08/22 1515  ceFEPIme (MAXIPIME) 2 g in sodium chloride 0.9 % 100 mL IVPB        2 g 200 mL/hr over 30 Minutes Intravenous  Once 03/08/22 1507 03/08/22 1635   03/08/22 1515  metroNIDAZOLE (FLAGYL) IVPB 500 mg        500 mg 100 mL/hr over 60 Minutes Intravenous  Once 03/08/22 1507 03/08/22 1750       Subjective: No CP Notes RUQ pain  Objective: Vitals:   03/09/22 0432 03/09/22 0906 03/09/22 1118 03/09/22 1531  BP: 136/74 123/75 (!) 157/69 (!) 146/71  Pulse: 85 (!) 111 (!) 117 (!) 102  Resp: '16 20 20 20  '$ Temp: 97.6 F (36.4 C) 97.8 F (36.6 C) 98 F (36.7 C) 97.9 F (36.6 C)  TempSrc: Oral Oral Oral Oral  SpO2: 94% 95% 94% 95%  Weight:      Height:        Intake/Output Summary (Last 24 hours) at 03/09/2022 1722 Last data filed at 03/09/2022 0626 Gross per 24 hour  Intake 1965.03 ml  Output 490 ml  Net 1475.03 ml   Filed Weights   03/08/22 1353 03/08/22 2049  Weight: 102.1 kg 103 kg    Examination:  General exam: Appears calm and comfortable  Respiratory system: unlabored Cardiovascular system: irregularly irregular Gastrointestinal system: perc chole tube in RUQ, mildly TTP Central nervous system: Alert and oriented. No focal neurological deficits. Extremities: no LEE   Data Reviewed: I have personally reviewed following labs  and imaging studies  CBC: Recent Labs  Lab 03/08/22 1404 03/09/22 0826  WBC 15.9* 11.2*  NEUTROABS 12.6* 8.9*  HGB 11.0* 11.2*  HCT 35.1* 34.2*  MCV 87.1 85.9  PLT 416* 948    Basic Metabolic Panel: Recent Labs  Lab 03/08/22 1404 03/09/22 0826  NA 133* 139  K 4.5 4.1  CL 100 109  CO2 18* 15*  GLUCOSE 121* 97  BUN 49* 41*  CREATININE 2.71* 2.27*  CALCIUM 9.4 9.0  MG 1.6* 2.1  PHOS  --  4.4    GFR: Estimated Creatinine Clearance: 25.1 mL/min (Smayan Hackbart) (by C-G formula based on SCr of 2.27 mg/dL (H)).  Liver Function Tests: Recent Labs  Lab 03/08/22 1404 03/09/22 0826  AST 16 21  ALT 33 35  ALKPHOS 271* 277*  BILITOT 0.9 0.8  PROT 7.9 7.0  ALBUMIN 4.1 3.1*    CBG: No results for input(s): "GLUCAP" in the last 168 hours.   Recent Results (from the past 240 hour(s))  Culture, blood (single)  Status: None (Preliminary result)   Collection Time: 03/08/22  3:47 PM   Specimen: Right Antecubital; Blood  Result Value Ref Range Status   Specimen Description   Final    RIGHT ANTECUBITAL BLOOD Performed at [facility] [address], [place], [place] [address]    Special Requests   Final    BOTTLES DRAWN AEROBIC AND ANAEROBIC Blood Culture adequate volume Performed at [facility], [address], [place], [place] [address]    Culture   Final    NO GROWTH < 24 HOURS Performed at [facility], [place] [address], [place], [place] [address]    Report Status PENDING  Incomplete         Radiology Studies: ECHOCARDIOGRAM COMPLETE  Result Date: 03/09/2022    ECHOCARDIOGRAM REPORT   Patient Name:   Lynn Joseph Date of Exam: 03/09/2022 Medical Rec #:  [id number]         Height:       65.0 in Accession #:    [id number]        Weight:       227.1 lb Date of Birth:  10-14-1945         BSA:          2.088 m Patient Age:    34 years          BP:           146/71 mmHg Patient Gender: F                 HR:           105 bpm. Exam  Location:  Inpatient Procedure: 2D Echo, Cardiac Doppler and Color Doppler Indications:    Atrial fibrillation  History:        Patient has prior history of Echocardiogram examinations, most                 recent 10/28/2018. CHF; Risk Factors:Hypertension.  Sonographer:    Clayton Lefort RDCS (AE) Referring Phys: Karmen Bongo IMPRESSIONS  1. Left ventricular ejection fraction, by estimation, is 55 to 60%. The left ventricle has normal function. The left ventricle has no regional wall motion abnormalities. Left ventricular diastolic function could not be evaluated.  2. Right ventricular systolic function is normal. The right ventricular size is normal. There is normal pulmonary artery systolic pressure. The estimated right ventricular systolic pressure is 79.0 mmHg.  3. The mitral valve is grossly normal. Trivial mitral valve regurgitation. No evidence of mitral stenosis.  4. The aortic valve is tricuspid. There is mild calcification of the aortic valve. Aortic valve regurgitation is not visualized. Aortic valve sclerosis is present, with no evidence of aortic valve stenosis.  5. The inferior vena cava is normal in size with greater than 50% respiratory variability, suggesting right atrial pressure of 3 mmHg. Comparison(s): Changes from prior study are noted. EF unchanged. Patient now in Afib. FINDINGS  Left Ventricle: Left ventricular ejection fraction, by estimation, is 55 to 60%. The left ventricle has normal function. The left ventricle has no regional wall motion abnormalities. The left ventricular internal cavity size was normal in size. There is  no left ventricular hypertrophy. Left ventricular diastolic function could not be evaluated due to atrial fibrillation. Left ventricular diastolic function could not be evaluated. Right Ventricle: The right ventricular size is normal. No increase in right ventricular wall thickness. Right ventricular systolic function is normal. There is normal pulmonary artery systolic  pressure. The  tricuspid regurgitant velocity is 1.79 m/s, and  with an assumed right atrial pressure of 3 mmHg, the estimated right ventricular systolic pressure is 16.1 mmHg. Left Atrium: Left atrial size was normal in size. Right Atrium: Right atrial size was normal in size. Pericardium: There is no evidence of pericardial effusion. Presence of epicardial fat layer. Mitral Valve: The mitral valve is grossly normal. Trivial mitral valve regurgitation. No evidence of mitral valve stenosis. Tricuspid Valve: The tricuspid valve is grossly normal. Tricuspid valve regurgitation is trivial. No evidence of tricuspid stenosis. Aortic Valve: The aortic valve is tricuspid. There is mild calcification of the aortic valve. Aortic valve regurgitation is not visualized. Aortic valve sclerosis is present, with no evidence of aortic valve stenosis. Aortic valve mean gradient measures 4.0 mmHg. Aortic valve peak gradient measures 6.4 mmHg. Aortic valve area, by VTI measures 2.59 cm. Pulmonic Valve: The pulmonic valve was grossly normal. Pulmonic valve regurgitation is trivial. No evidence of pulmonic stenosis. Aorta: The aortic root and ascending aorta are structurally normal, with no evidence of dilitation. Venous: The inferior vena cava is normal in size with greater than 50% respiratory variability, suggesting right atrial pressure of 3 mmHg. IAS/Shunts: The atrial septum is grossly normal.  LEFT VENTRICLE PLAX 2D LVIDd:         4.40 cm LVIDs:         2.70 cm LV PW:         1.20 cm LV IVS:        1.10 cm LVOT diam:     2.00 cm LV SV:         51 LV SV Index:   25 LVOT Area:     3.14 cm  RIGHT VENTRICLE             IVC RV Basal diam:  3.10 cm     IVC diam: 1.60 cm RV S prime:     12.90 cm/s TAPSE (M-mode): 1.8 cm LEFT ATRIUM           Index        RIGHT ATRIUM           Index LA diam:      4.20 cm 2.01 cm/m   RA Area:     16.30 cm LA Vol (A2C): 55.5 ml 26.58 ml/m  RA Volume:   42.50 ml  20.36 ml/m LA Vol (A4C): 61.0 ml 29.22  ml/m  AORTIC VALVE AV Area (Vmax):    2.49 cm AV Area (Vmean):   2.35 cm AV Area (VTI):     2.59 cm AV Vmax:           126.00 cm/s AV Vmean:          91.300 cm/s AV VTI:            0.198 m AV Peak Grad:      6.4 mmHg AV Mean Grad:      4.0 mmHg LVOT Vmax:         100.00 cm/s LVOT Vmean:        68.300 cm/s LVOT VTI:          0.163 m LVOT/AV VTI ratio: 0.82  AORTA Ao Root diam: 3.20 cm Ao Asc diam:  3.70 cm TRICUSPID VALVE TR Peak grad:   12.8 mmHg TR Vmax:        179.00 cm/s  SHUNTS Systemic VTI:  0.16 m Systemic Diam: 2.00 cm Eleonore Chiquito MD Electronically signed by Eleonore Chiquito MD Signature Date/Time: 03/09/2022/5:19:26  PM    Final    IR Sinus/Fist Tube Chk-Non GI  Result Date: 03/09/2022 INDICATION: 76 year old female who presents with Burleigh Brockmann biliary drainage catheter placed at an outside institution. EXAM: SINUS TRACT INJECTION/FISTULOGRAM MEDICATIONS: None. ANESTHESIA/SEDATION: None. FLUOROSCOPY TIME:  Radiation exposure index: 2 mGy reference air kerma COMPLICATIONS: None immediate. PROCEDURE: Informed written consent was obtained from the patient after Eddie Koc thorough discussion of the procedural risks, benefits and alternatives. All questions were addressed. Maximal Sterile Barrier Technique was utilized including caps, mask, sterile gowns, sterile gloves, sterile drape, hand hygiene and skin antiseptic. Xylah Early timeout was performed prior to the initiation of the procedure. Gevork Ayyad gentle hand injection of contrast material was performed. This is an 15 [place] internal/external biliary drainage catheter. The drainage catheter is patent. No evidence of biliary ductal dilatation. Contrast material passes freely into the ampulla. Additionally, contrast material refluxes through the patent cystic duct and partially opacifies the collapsed gallbladder. IMPRESSION: 1. Eight [place] internal/external biliary drainage catheter is well-positioned and normally functioning. 2. Patent cystic duct. The internal/external biliary  drainage catheter was capped. Electronically Signed   By: Jacqulynn Cadet M.D.   On: 03/09/2022 13:07   CT CHEST ABDOMEN PELVIS WO CONTRAST  Result Date: 03/08/2022 CLINICAL DATA:  Right upper quadrant pain, recent per chole tube. EXAM: CT CHEST, ABDOMEN AND PELVIS WITHOUT CONTRAST TECHNIQUE: Multidetector CT imaging of the chest, abdomen and pelvis was performed following the standard protocol without IV contrast. RADIATION DOSE REDUCTION: This exam was performed according to the departmental dose-optimization program which includes automated exposure control, adjustment of the mA and/or kV according to patient size and/or use of iterative reconstruction technique. COMPARISON:  None Available. FINDINGS: CT CHEST FINDINGS Cardiovascular: Normal heart size. No pericardial effusion. Normal caliber thoracic aorta with mild calcified plaque. No visible coronary artery calcifications. Mediastinum/Nodes: Small hiatal hernia. No pathologically enlarged lymph nodes seen in the chest Lungs/Pleura: Central airways are patent small right pleural effusion and right basilar atelectasis. No consolidation, pleural effusion or pneumothorax Musculoskeletal: No chest wall mass or suspicious bone lesions identified. CT ABDOMEN PELVIS FINDINGS Hepatobiliary: Small subcapsular collection located adjacent to the percutaneous biliary drain measuring 5.3 x 1.3 cm. Decompressed gallbladder with gallstones evidence of biliary ductal dilation. Common bile duct drain in place. No evidence of biliary ductal dilation. Pancreas: Unremarkable. No pancreatic ductal dilatation or surrounding inflammatory changes. Spleen: Normal in size without focal abnormality. Adrenals/Urinary Tract: Bilateral adrenal glands are unremarkable. No hydronephrosis or nephrolithiasis. Bladder is unremarkable. Stomach/Bowel: Postsurgical changes of the stomach. No bowel wall thickening, inflammatory change or evidence of obstruction. Normal appendix  Vascular/Lymphatic: Aortic atherosclerosis. No enlarged abdominal or pelvic lymph nodes. Reproductive: Uterus and bilateral adnexa are unremarkable. Other: Trace high-density free fluid seen in the pelvis. Musculoskeletal: No acute or significant osseous findings. IMPRESSION: 1. Small subcapsular collection located adjacent to the percutaneous biliary drain, likely postprocedural hematoma or biloma. HIDA scan could assist with further evaluation. 2. Small right pleural effusion and right basilar atelectasis. 3. Trace high-density free fluid seen in the pelvis, likely due to small volume hemoperitoneum related to recent procedure. 4. Aortic Atherosclerosis (ICD10-I70.0). Electronically Signed   By: Yetta Glassman M.D.   On: 03/08/2022 15:48        Scheduled Meds:  levothyroxine  125 mcg Oral Q0600   metoprolol tartrate  50 mg Oral TID   sodium chloride flush  3 mL Intravenous Q12H   Continuous Infusions:  ceFEPime (MAXIPIME) IV 2 g (03/09/22 1621)   diltiazem (CARDIZEM) infusion Stopped (  03/09/22 0447)   lactated ringers 125 mL/hr at 03/09/22 1404     LOS: 1 day    Time spent: over 30 min    Fayrene Helper, MD [facility]   To contact the attending provider between 7A-7P or the covering provider during after hours 7P-7A, please log into the web site [email or web address] and access using universal [place] password for that web site. If you do not have the password, please call the hospital operator.  03/09/2022, 5:22 PM

## 2022-03-09 NOTE — Consult Note (Signed)
Reason for Consult/Chief Complaint: PTC drain in place Consultant: Florene Glen, MD  Lynn Joseph is an 76 y.o. female.   HPI: 89F who traveled to [place] approximately 2w ago and had an OV with cardiology. Increased dose of one med and added a second for HTN. After taking these, began having CP, abdominal pain, lethargy that was presumed to be an adverse reaction. Seen in the ED for this. On 11/6, began having abdominal pain, pressure, burping. Seen in ED and dx with gallbladder disease, transferred to [facility] in [place], [place]. She states they told her they could not do an ERCP due to prior h/o RnYGBP.Marland Kitchen She reports that she had a surgical consultation and was told she was not a candidate for surgery until after her CBD had been cleared. 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 She had an appointment scheduled with Dr. Harlow Asa and a family member who is a nurse brought her to [facility] instead because she felt the patient was in atrial fibrillation. She is currently in [place], I suspect due to dehydration from her high PTC output.    Past Medical History:  Diagnosis Date   Anemia of chronic disease    Chronic diarrhea    Glaucoma    HTN (hypertension)    Hypertension    Hypothyroid    Obesity    Skin cancer     Past Surgical History:  Procedure Laterality Date   GASTRIC BYPASS     GASTRIC RESTRICTION SURGERY     TUBAL LIGATION      Family History  Problem Relation Age of Onset   Hypertension Mother    Thyroid disease Mother    Stroke Mother    Kidney disease Father    Diabetes Father     Social History:  reports that she has never smoked. She has never used smokeless tobacco. She reports that she does not drink alcohol and does not use drugs.  Allergies:  Allergies  Allergen Reactions    Amlodipine Swelling and Other (See Comments)    Lowered BP to much, lethargic    Penicillin G Sodium Other (See Comments)    GI upset     Medications: I have reviewed the patient's current medications.  Results for orders placed or performed during the hospital encounter of 03/08/22 (from the past 48 hour(s))  CBC with Differential     Status: Abnormal   Collection Time: 03/08/22  2:04 PM  Result Value Ref Range   WBC 15.9 (H) 4.0 - 10.5 K/uL   RBC 4.03 3.87 - 5.11 MIL/uL   Hemoglobin 11.0 (L) 12.0 - 15.0 g/dL   HCT 35.1 (L) 36.0 - 46.0 %   MCV 87.1 80.0 - 100.0 fL   MCH 27.3 26.0 - 34.0 pg   MCHC 31.3 30.0 - 36.0 g/dL   RDW 13.8 11.5 - 15.5 %   Platelets 416 (H) 150 - 400 K/uL   nRBC 0.0 0.0 - 0.2 %   Neutrophils Relative % 80 %   Neutro Abs 12.6 (H) 1.7 - 7.7 K/uL   Lymphocytes Relative 9 %   Lymphs Abs 1.4 0.7 - 4.0 K/uL   Monocytes Relative 10 %   Monocytes Absolute 1.6 (H) 0.1 - 1.0 K/uL   Eosinophils Relative 0 %   Eosinophils Absolute 0.1  0.0 - 0.5 K/uL   Basophils Relative 0 %   Basophils Absolute 0.1 0.0 - 0.1 K/uL   Immature Granulocytes 1 %   Abs Immature Granulocytes 0.17 (H) 0.00 - 0.07 K/uL    Comment: Performed at [facility], [address], [place], [place] [address]  Comprehensive metabolic panel     Status: Abnormal   Collection Time: 03/08/22  2:04 PM  Result Value Ref Range   Sodium 133 (L) 135 - 145 mmol/L   Potassium 4.5 3.5 - 5.1 mmol/L   Chloride 100 98 - 111 mmol/L   CO2 18 (L) 22 - 32 mmol/L   Glucose, Bld 121 (H) 70 - 99 mg/dL    Comment: Glucose reference range applies only to samples taken after fasting for at least 8 hours.   BUN 49 (H) 8 - 23 mg/dL   Creatinine, Ser 2.71 (H) 0.44 - 1.00 mg/dL   Calcium 9.4 8.9 - 10.3 mg/dL   Total Protein 7.9 6.5 - 8.1 g/dL   Albumin 4.1 3.5 - 5.0 g/dL   AST 16 15 - 41 U/L   ALT 33 0 - 44 U/L   Alkaline Phosphatase 271 (H) 38 - 126 U/L   Total Bilirubin 0.9 0.3 - 1.2 mg/dL    GFR, Estimated 18 (L) >60 mL/min    Comment: (NOTE) Calculated using the CKD-EPI Creatinine Equation (2021)    Anion gap 15 5 - 15    Comment: Performed at [facility], [address], [place], [place] [address]  Brain natriuretic peptide     Status: Abnormal   Collection Time: 03/08/22  2:04 PM  Result Value Ref Range   B Natriuretic Peptide 180.3 (H) 0.0 - 100.0 pg/mL    Comment: Performed at [facility], [address] [place], [place] [address]  Magnesium     Status: Abnormal   Collection Time: 03/08/22  2:04 PM  Result Value Ref Range   Magnesium 1.6 (L) 1.7 - 2.4 mg/dL    Comment: Performed at [facility], [address], [place], [place] [address]  TSH     Status: Abnormal   Collection Time: 03/08/22  2:33 PM  Result Value Ref Range   TSH 0.241 (L) 0.350 - 4.500 uIU/mL    Comment: Performed by a 3rd Generation assay with a functional sensitivity of <=0.01 uIU/mL. Performed at [facility], [address], [place], [place] [address]   Lactic acid, plasma     Status: None   Collection Time: 03/08/22  2:33 PM  Result Value Ref Range   Lactic Acid, Venous 0.8 0.5 - 1.9 mmol/L    Comment: Performed at [facility], [address], [place], [place] [address]  Culture, blood (single)     Status: None (Preliminary result)   Collection Time: 03/08/22  3:47 PM   Specimen: Right Antecubital; Blood  Result Value Ref Range   Specimen Description      RIGHT ANTECUBITAL BLOOD Performed at [facility] [address] [place], [place] [address]    Special Requests      BOTTLES DRAWN AEROBIC AND ANAEROBIC Blood Culture adequate volume Performed at [facility], [address], [place], [place] [address]    Culture      NO GROWTH < 24 HOURS Performed at [facility], [place] [address], [place],  [address]    Report Status PENDING   T4, free      Status: Abnormal  Collection Time: 03/08/22 10:14 PM  Result Value Ref Range   Free T4 1.17 (H) 0.61 - 1.12 ng/dL    Comment: (NOTE) Biotin ingestion may interfere with free T4 tests. If the results are inconsistent with the TSH level, previous test results, or the clinical presentation, then consider biotin interference. If needed, order repeat testing after stopping biotin. Performed at [facility], [place] [address] [place], [place] [address]   CBC with Differential/Platelet     Status: Abnormal   Collection Time: 03/09/22  8:26 AM  Result Value Ref Range   WBC 11.2 (H) 4.0 - 10.5 K/uL   RBC 3.98 3.87 - 5.11 MIL/uL   Hemoglobin 11.2 (L) 12.0 - 15.0 g/dL   HCT 34.2 (L) 36.0 - 46.0 %   MCV 85.9 80.0 - 100.0 fL   MCH 28.1 26.0 - 34.0 pg   MCHC 32.7 30.0 - 36.0 g/dL   RDW 13.8 11.5 - 15.5 %   Platelets 334 150 - 400 K/uL    Comment: REPEATED TO VERIFY   nRBC 0.0 0.0 - 0.2 %   Neutrophils Relative % 80 %   Neutro Abs 8.9 (H) 1.7 - 7.7 K/uL   Lymphocytes Relative 9 %   Lymphs Abs 1.0 0.7 - 4.0 K/uL   Monocytes Relative 9 %   Monocytes Absolute 1.0 0.1 - 1.0 K/uL   Eosinophils Relative 1 %   Eosinophils Absolute 0.1 0.0 - 0.5 K/uL   Basophils Relative 0 %   Basophils Absolute 0.0 0.0 - 0.1 K/uL   Immature Granulocytes 1 %   Abs Immature Granulocytes 0.11 (H) 0.00 - 0.07 K/uL    Comment: Performed at [facility] [address], [place], [place] [address]    CT CHEST ABDOMEN PELVIS WO CONTRAST  Result Date: 03/08/2022 CLINICAL DATA:  Right upper quadrant pain, recent per chole tube. EXAM: CT CHEST, ABDOMEN AND PELVIS WITHOUT CONTRAST TECHNIQUE: Multidetector CT imaging of the chest, abdomen and pelvis was performed following the standard protocol without IV contrast. RADIATION DOSE REDUCTION: This exam was performed according to the departmental dose-optimization program which includes automated exposure control, adjustment of the mA and/or kV according to patient  size and/or use of iterative reconstruction technique. COMPARISON:  None Available. FINDINGS: CT CHEST FINDINGS Cardiovascular: Normal heart size. No pericardial effusion. Normal caliber thoracic aorta with mild calcified plaque. No visible coronary artery calcifications. Mediastinum/Nodes: Small hiatal hernia. No pathologically enlarged lymph nodes seen in the chest Lungs/Pleura: Central airways are patent small right pleural effusion and right basilar atelectasis. No consolidation, pleural effusion or pneumothorax Musculoskeletal: No chest wall mass or suspicious bone lesions identified. CT ABDOMEN PELVIS FINDINGS Hepatobiliary: Small subcapsular collection located adjacent to the percutaneous biliary drain measuring 5.3 x 1.3 cm. Decompressed gallbladder with gallstones evidence of biliary ductal dilation. Common bile duct drain in place. No evidence of biliary ductal dilation. Pancreas: Unremarkable. No pancreatic ductal dilatation or surrounding inflammatory changes. Spleen: Normal in size without focal abnormality. Adrenals/Urinary Tract: Bilateral adrenal glands are unremarkable. No hydronephrosis or nephrolithiasis. Bladder is unremarkable. Stomach/Bowel: Postsurgical changes of the stomach. No bowel wall thickening, inflammatory change or evidence of obstruction. Normal appendix Vascular/Lymphatic: Aortic atherosclerosis. No enlarged abdominal or pelvic lymph nodes. Reproductive: Uterus and bilateral adnexa are unremarkable. Other: Trace high-density free fluid seen in the pelvis. Musculoskeletal: No acute or significant osseous findings. IMPRESSION: 1. Small subcapsular collection located adjacent to the percutaneous biliary drain, likely postprocedural hematoma or biloma. HIDA scan could assist with  further evaluation. 2. Small right pleural effusion and right basilar atelectasis. 3. Trace high-density free fluid seen in the pelvis, likely due to small volume hemoperitoneum related to recent procedure. 4.  Aortic Atherosclerosis (ICD10-I70.0). Electronically Signed   By: Yetta Glassman M.D.   On: 03/08/2022 15:48    ROS 10 point review of systems is negative except as listed above in HPI.   Physical Exam Blood pressure 123/75, pulse (!) 111, temperature 97.8 F (36.6 C), temperature source Oral, resp. rate 20, height _0  (1.651 m), weight 103 kg, SpO2 95 %. Constitutional: well-developed, well-nourished HEENT: pupils equal, round, reactive to light, 68m b/l, moist conjunctiva, external inspection of ears and nose normal, hearing intact Oropharynx: normal oropharyngeal mucosa, poor dentition Neck: no thyromegaly, trachea midline, no midline cervical tenderness to palpation Chest: breath sounds equal bilaterally, normal respiratory effort, no midline or lateral chest wall tenderness to palpation/deformity Abdomen: soft, tender around the drain, gravity bag with bilious o/p, no bruising, no hepatosplenomegaly GU: normal female genitalia  Back: no wounds, no thoracic/lumbar spine tenderness to palpation, no thoracic/lumbar spine stepoffs Rectal: deferred Extremities: 2+ radial and pedal pulses bilaterally, intact motor and sensation bilateral UE and LE, no peripheral edema MSK: unable to assess gait/station, no clubbing/cyanosis of fingers/toes, normal ROM of all four extremities Skin: warm, dry, no rashes Psych: normal memory, normal mood/affect     Assessment/Plan: 79F with h/o RnYGBP and 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 [facility] Recommend IR cholangiogram to confirm patency of CBD and plan to cap PTC. Recommend cardiac risk stratification, pending risk level and indications for cardiac optimization, may be a candidate for lap chole 11/18 vs outpatient elective lap chole. Okay for regular diet after IR cholangiogram, NPO at midnight in anticipation of surgery. Okay for heparin drip/DOAC post-op, timing of initiation to  be determined post-op.    AJesusita Oka MD General and [facility]

## 2022-03-09 NOTE — Progress Notes (Signed)
  Echocardiogram 2D Echocardiogram has been performed.  Lynn Joseph 03/09/2022, 5:16 PM

## 2022-03-09 NOTE — TOC Benefit Eligibility Note (Signed)
Patient Teacher, English as a foreign language completed.    The patient is currently admitted and upon discharge could be taking Eliquis 5 mg.  The current 30 day co-pay is $38.00.   The patient is currently admitted and upon discharge could be taking Xarelto 20 mg.  The current 30 day co-pay is $38.00.   The patient is insured through [place] ([place] is the only [employer] that takes T[employer]are)     Lyndel Safe, [place] Patient Advocate Specialist [place] Patient Advocate Team Direct Number: [phone]  Fax: [phone]

## 2022-03-09 NOTE — Progress Notes (Addendum)
Rounding Note    Patient Name: Lynn Joseph Date of Encounter: 03/09/2022  [facility] Cardiologist: Early Osmond, MD - new  Subjective   Pt feeling much better.   Inpatient Medications    Scheduled Meds:  levothyroxine  125 mcg Oral Q0600   metoprolol tartrate  50 mg Oral BID   sodium chloride flush  3 mL Intravenous Q12H   Continuous Infusions:  ceFEPime (MAXIPIME) IV     diltiazem (CARDIZEM) infusion Stopped (03/09/22 0447)   lactated ringers 125 mL/hr at 03/09/22 0552   PRN Meds: acetaminophen **OR** acetaminophen, hydrALAZINE, ondansetron **OR** ondansetron (ZOFRAN) IV, oxyCODONE   Vital Signs    Vitals:   03/08/22 1950 03/08/22 2049 03/08/22 2313 03/09/22 0432  BP: 113/77 108/66 (!) 109/51 136/74  Pulse: (!) 101 (!) 119 76 85  Resp: '20 20 15 16  '$ Temp: 98.3 F (36.8 C) 97.9 F (36.6 C) 97.6 F (36.4 C) 97.6 F (36.4 C)  TempSrc: Oral Oral Oral Oral  SpO2: 95% 96% 94% 94%  Weight:  103 kg    Height:  '5\' 5"'$  (1.651 m)      Intake/Output Summary (Last 24 hours) at 03/09/2022 0812 Last data filed at 03/09/2022 0552 Gross per 24 hour  Intake 2570.52 ml  Output 490 ml  Net 2080.52 ml      03/08/2022    8:49 PM 03/08/2022    1:53 PM 12/22/2021   10:42 AM  Last 3 Weights  Weight (lbs) 227 lb 1.2 oz 225 lb 240 lb 8 oz  Weight (kg) 103 kg 102.059 kg 109.09 kg      Telemetry    Afib with VR in the 80-120s - Personally Reviewed  ECG    No new tracings - Personally Reviewed  Physical Exam   GEN: No acute distress.   Neck: No JVD Cardiac: iRRR Respiratory: Clear to auscultation bilaterally. GI: tender at tube site MS: No edema; No deformity. Neuro:  Nonfocal  Psych: Normal affect   Labs    High Sensitivity Troponin:  No results for input(s): "TROPONINIHS" in the last 720 hours.   Chemistry Recent Labs  Lab 03/08/22 1404  NA 133*  K 4.5  CL 100  CO2 18*  GLUCOSE 121*  BUN 49*  CREATININE 2.71*  CALCIUM 9.4  MG  1.6*  PROT 7.9  ALBUMIN 4.1  AST 16  ALT 33  ALKPHOS 271*  BILITOT 0.9  GFRNONAA 18*  ANIONGAP 15    Lipids No results for input(s): "CHOL", "TRIG", "HDL", "LABVLDL", "LDLCALC", "CHOLHDL" in the last 168 hours.  Hematology Recent Labs  Lab 03/08/22 1404  WBC 15.9*  RBC 4.03  HGB 11.0*  HCT 35.1*  MCV 87.1  MCH 27.3  MCHC 31.3  RDW 13.8  PLT 416*   Thyroid  Recent Labs  Lab 03/08/22 1433 03/08/22 2214  TSH 0.241*  --   FREET4  --  1.17*    BNP Recent Labs  Lab 03/08/22 1404  BNP 180.3*    DDimer No results for input(s): "DDIMER" in the last 168 hours.   Radiology    CT CHEST ABDOMEN PELVIS WO CONTRAST  Result Date: 03/08/2022 CLINICAL DATA:  Right upper quadrant pain, recent per chole tube. EXAM: CT CHEST, ABDOMEN AND PELVIS WITHOUT CONTRAST TECHNIQUE: Multidetector CT imaging of the chest, abdomen and pelvis was performed following the standard protocol without IV contrast. RADIATION DOSE REDUCTION: This exam was performed according to the departmental dose-optimization program which includes automated exposure  control, adjustment of the mA and/or kV according to patient size and/or use of iterative reconstruction technique. COMPARISON:  None Available. FINDINGS: CT CHEST FINDINGS Cardiovascular: Normal heart size. No pericardial effusion. Normal caliber thoracic aorta with mild calcified plaque. No visible coronary artery calcifications. Mediastinum/Nodes: Small hiatal hernia. No pathologically enlarged lymph nodes seen in the chest Lungs/Pleura: Central airways are patent small right pleural effusion and right basilar atelectasis. No consolidation, pleural effusion or pneumothorax Musculoskeletal: No chest wall mass or suspicious bone lesions identified. CT ABDOMEN PELVIS FINDINGS Hepatobiliary: Small subcapsular collection located adjacent to the percutaneous biliary drain measuring 5.3 x 1.3 cm. Decompressed gallbladder with gallstones evidence of biliary ductal  dilation. Common bile duct drain in place. No evidence of biliary ductal dilation. Pancreas: Unremarkable. No pancreatic ductal dilatation or surrounding inflammatory changes. Spleen: Normal in size without focal abnormality. Adrenals/Urinary Tract: Bilateral adrenal glands are unremarkable. No hydronephrosis or nephrolithiasis. Bladder is unremarkable. Stomach/Bowel: Postsurgical changes of the stomach. No bowel wall thickening, inflammatory change or evidence of obstruction. Normal appendix Vascular/Lymphatic: Aortic atherosclerosis. No enlarged abdominal or pelvic lymph nodes. Reproductive: Uterus and bilateral adnexa are unremarkable. Other: Trace high-density free fluid seen in the pelvis. Musculoskeletal: No acute or significant osseous findings. IMPRESSION: 1. Small subcapsular collection located adjacent to the percutaneous biliary drain, likely postprocedural hematoma or biloma. HIDA scan could assist with further evaluation. 2. Small right pleural effusion and right basilar atelectasis. 3. Trace high-density free fluid seen in the pelvis, likely due to small volume hemoperitoneum related to recent procedure. 4. Aortic Atherosclerosis (ICD10-I70.0). Electronically Signed   By: Yetta Glassman M.D.   On: 03/08/2022 15:48    Cardiac Studies   Echo pending  Heart monitor 01/15/22: Event monitor Patch Wear Time:  11 days and 16 hours (2023-09-05T16:41:08-398 to 2023-09-17T09:31:56-0400)   Normal sinus rhythm Patient had a min HR of 50 bpm, max HR of 171 bpm, and avg HR of 68 bpm.    2 Ventricular Tachycardia runs occurred, the run with the fastest interval lasting 4 beats with a max rate of 132 bpm (avg 102 bpm); the run with the fastest interval was also the longest.    9 Supraventricular Tachycardia runs occurred, the run with the fastest interval lasting 4 beats with a max rate of 171 bpm, the longest lasting 11 beats with an avg rate of 119 bpm.    Isolated SVEs were occasional (1.1%,  12376), SVE Couplets were rare (<1.0%, 106), and SVE Triplets were rare (<1.0%, 26).  Isolated VEs were rare (<1.0%), VE Couplets were rare (<1.0%), and no VE Triplets were present.    Patient triggered events associated with normal sinus rhythm   Nuclear stress test 01/11/22: Pharmacological myocardial perfusion imaging study with no significant  ischemia Normal wall motion, EF estimated at 59% No EKG changes concerning for ischemia at peak stress or in recovery. CT attenuation correction images with no significant coronary calcification, minimal aortic atherosclerosis Low risk scan   Patient Profile     77 y.o. female with HTN, HLD, thyroid disorder, CRI, and obesity who is being seen 03/08/2022 for the evaluation of new-dx AF at the request of Dr. Lorin Mercy.   Pt recently referred to cardiology for CP and palpitations. Renal functio precluded use of CCTA. Nuclear stress test was not ischemic. Heart monitor did not show Afib/flutter. Review of prior echos does not show CHF.   Assessment & Plan    Paroxysmal atrial fibrillation Afib with RVR Presented to [facility] with Afib  RVR with VR 141 This is a new diagnosis for her In the setting of sepsis due to cholecystitis  Started on cardizem gtt running at 5 mg/hr --> this has since been titrated off with increase in lopressor - rates in the 100-120s while I was in the room - BP in the 100-130s - increase lopressor to 50 mg TID for better rate control while we obtain echocardiogram - if EF normal, she would benefit from a low dose of cardizem, perhaps 120 mg, but will need to monitor symptoms and BP - if Afib persists, can consider OP DCCV after at least 3 weeks of anticoagulation - will not consider DCCV prior to surgery unless she becomes hemodynamically unstable since DCCV would require 4 weeks of anticoagulation post cardioversion   Sepsis 2/2 cholecystitis  Surgery consulted   Need for chronic  anticoagulation Pericapsular hematoma This patients CHA2DS2-VASc Score and unadjusted Ischemic Stroke Rate (% per year) is equal to 4.8 % stroke rate/year from a score of 4 (2age, female, HTN) - appreciate input from surgical team regarding size of hematoma and plans for surgery - there is mention in a prior note that surgery is ok with anticoagulation - will await formal surgery consult - if OK with surgery, we recommend 5 mg eliquis BID   Hypertension She had a recent change in her medications (metoprolol changed to coreg) resulting in dizziness, chest pain, and lethargy. Coreg changed back to lopressor and continued home hydralazine. PTA: 100 mg lopressor BID, 25 mg hydralazine TID - was not taking triamterene-HCTZ 37.5-25 - continue lopressor 50 mg BID - hold additional BP medications for rate controlling agents - avoid amlodipine and coreg based on prior side effects   AoCKD stage III Baseline creatinine near 1.5-1.6 sCr this admission 2.71 - has had poor PO intake, suspect a component of dehydration   Thyroid disease TSH 0.241, free T4 1.17, T3 pending   Preoperative risk evaluation Echo pending Recent reassuring nuclear stress test Do not anticipate additional cardiology testing prior to cholecystectomy, but will wait for formal echo.  If she is anticoagulated, she may hold eliquis 2 days prior to surgery.       For questions or updates, please contact [place] Please consult [email or web address] for contact info under        Signed, Ledora Bottcher, PA  03/09/2022, 8:12 AM    ATTENDING ATTESTATION:  After conducting a review of all available clinical information with the care team, interviewing the patient, and performing a physical exam, I agree with the findings and plan described in this note.   GEN: No acute distress.   HEENT:  MMM, no JVD, no scleral icterus Cardiac: Irregular RR, no murmurs, rubs, or gallops.  Respiratory: Clear to  auscultation bilaterally. GI: Soft, nontender, non-distended  MS: No edema; No deformity. Neuro:  Nonfocal  Vasc:  +2 radial pulses  Patient with new onset AF RVR in the setting of cholecystitis requiring cholecystecomy.  CT with trace hemoperitoneum and subcapsular hematoma.  Awaiting surgery recs regarding timing and permissibility of anticoagulation.  For now we will pursue rate control.  If A/C is permitted from a surgical perspective, would start heparin gtt especially if surgery to be done this admission.  If not, then Eliquis 5 mg BID.  Surgical plans will inform whether we pursue a TEE CV as well. Follow up TTE tdoay.  Patient is at low risk for CV issues given recent low risk stress test.    Lenna Sciara,  MD Pager [phone]

## 2022-03-10 ENCOUNTER — Encounter (HOSPITAL_COMMUNITY)
Admission: EM | Disposition: A | Discharge status: Home / Self Care | Payer: Self-pay | Source: Home / Self Care | Attending: Internal Medicine

## 2022-03-10 DIAGNOSIS — I4891 Unspecified atrial fibrillation: Secondary | ICD-10-CM | POA: Diagnosis not present

## 2022-03-10 LAB — COMPREHENSIVE METABOLIC PANEL
ALT: 28 U/L (ref 0–44)
AST: 21 U/L (ref 15–41)
Albumin: 2.7 g/dL — ABNORMAL LOW (ref 3.5–5.0)
Alkaline Phosphatase: 225 U/L — ABNORMAL HIGH (ref 38–126)
Anion gap: 11 (ref 5–15)
BUN: 36 mg/dL — ABNORMAL HIGH (ref 8–23)
CO2: 16 mmol/L — ABNORMAL LOW (ref 22–32)
Calcium: 8.4 mg/dL — ABNORMAL LOW (ref 8.9–10.3)
Chloride: 110 mmol/L (ref 98–111)
Creatinine, Ser: 2.03 mg/dL — ABNORMAL HIGH (ref 0.44–1.00)
GFR, Estimated: 25 mL/min — ABNORMAL LOW (ref >60)
Glucose, Bld: 110 mg/dL — ABNORMAL HIGH (ref 70–99)
Potassium: 3.8 mmol/L (ref 3.5–5.1)
Sodium: 137 mmol/L (ref 135–145)
Total Bilirubin: 0.7 mg/dL (ref 0.3–1.2)
Total Protein: 6.1 g/dL — ABNORMAL LOW (ref 6.5–8.1)

## 2022-03-10 LAB — CBC WITH DIFFERENTIAL/PLATELET
Abs Immature Granulocytes: 0.14 10*3/uL — ABNORMAL HIGH (ref 0.00–0.07)
Basophils Absolute: 0.1 10*3/uL (ref 0.0–0.1)
Basophils Relative: 1 %
Eosinophils Absolute: 0.1 10*3/uL (ref 0.0–0.5)
Eosinophils Relative: 1 %
HCT: 30.3 % — ABNORMAL LOW (ref 36.0–46.0)
Hemoglobin: 9.8 g/dL — ABNORMAL LOW (ref 12.0–15.0)
Immature Granulocytes: 1 %
Lymphocytes Relative: 13 %
Lymphs Abs: 1.3 10*3/uL (ref 0.7–4.0)
MCH: 28.3 pg (ref 26.0–34.0)
MCHC: 32.3 g/dL (ref 30.0–36.0)
MCV: 87.6 fL (ref 80.0–100.0)
Monocytes Absolute: 1.1 10*3/uL — ABNORMAL HIGH (ref 0.1–1.0)
Monocytes Relative: 11 %
Neutro Abs: 7.1 10*3/uL (ref 1.7–7.7)
Neutrophils Relative %: 73 %
Platelets: 296 10*3/uL (ref 150–400)
RBC: 3.46 MIL/uL — ABNORMAL LOW (ref 3.87–5.11)
RDW: 13.8 % (ref 11.5–15.5)
WBC: 9.8 10*3/uL (ref 4.0–10.5)
nRBC: 0 % (ref 0.0–0.2)

## 2022-03-10 LAB — PHOSPHORUS: Phosphorus: 3.7 mg/dL (ref 2.5–4.6)

## 2022-03-10 LAB — T3: T3, Total: 42 ng/dL — ABNORMAL LOW (ref 71–180)

## 2022-03-10 LAB — MAGNESIUM: Magnesium: 1.8 mg/dL (ref 1.7–2.4)

## 2022-03-10 SURGERY — LAPAROSCOPIC CHOLECYSTECTOMY
Anesthesia: General

## 2022-03-10 MED ORDER — SODIUM CHLORIDE 0.9 % IV SOLN
2.0000 g | INTRAVENOUS | Status: DC
  Administered 2022-03-10: 2 g via INTRAVENOUS
  Filled 2022-03-10 (×2): qty 20

## 2022-03-10 MED ORDER — METOPROLOL TARTRATE 50 MG PO TABS
75.0000 mg | ORAL_TABLET | Freq: Three times a day (TID) | ORAL | Status: DC
  Administered 2022-03-10 – 2022-03-12 (×7): 75 mg via ORAL
  Filled 2022-03-10 (×7): qty 1

## 2022-03-10 MED ORDER — METRONIDAZOLE 500 MG/100ML IV SOLN
500.0000 mg | Freq: Three times a day (TID) | INTRAVENOUS | Status: DC
  Administered 2022-03-10 – 2022-03-11 (×3): 500 mg via INTRAVENOUS
  Filled 2022-03-10 (×3): qty 100

## 2022-03-10 MED ORDER — SODIUM BICARBONATE 650 MG PO TABS
650.0000 mg | ORAL_TABLET | Freq: Three times a day (TID) | ORAL | Status: DC
  Administered 2022-03-10 – 2022-03-12 (×8): 650 mg via ORAL
  Filled 2022-03-10 (×8): qty 1

## 2022-03-10 NOTE — Progress Notes (Signed)
Subjective: Creatinine downtrending, bicarb 16. Patient has some discomfort around drain, but overall reports she is feeling better today.  Objective: Vital signs in last 24 hours: Temp:  [97.8 F (36.6 C)-98 F (36.7 C)] 97.8 F (36.6 C) (11/18 0441) Pulse Rate:  [50-117] 68 (11/18 0405) Resp:  [15-20] 16 (11/18 0441) BP: (109-157)/(63-75) 123/63 (11/18 0441) SpO2:  [82 %-95 %] 82 % (11/18 0441) Weight:  [104.3 kg] 104.3 kg (11/18 0405) Last BM Date : 03/07/22  Intake/Output from previous day: 11/17 0701 - 11/18 0700 In: 437.3 [P.O.:150; I.V.:287.3] Out: 200 [Drains:200] Intake/Output this shift: No intake/output data recorded.  PE: General: resting comfortably, NAD Neuro: alert and oriented, no focal deficits Resp: normal work of breathing CV: tachycardic 120s to 130s, irregular rhythm Abdomen: soft, nondistended, nontender to palpation. PTC in place RUQ, capped. Extremities: warm and well-perfused   Lab Results:  Recent Labs    03/09/22 0826 03/10/22 0117  WBC 11.2* 9.8  HGB 11.2* 9.8*  HCT 34.2* 30.3*  PLT 334 296   BMET Recent Labs    03/09/22 0826 03/10/22 0117  NA 139 137  K 4.1 3.8  CL 109 110  CO2 15* 16*  GLUCOSE 97 110*  BUN 41* 36*  CREATININE 2.27* 2.03*  CALCIUM 9.0 8.4*   PT/INR No results for input(s): "LABPROT", "INR" in the last 72 hours. CMP     Component Value Date/Time   NA 137 03/10/2022 0117   K 3.8 03/10/2022 0117   CL 110 03/10/2022 0117   CO2 16 (L) 03/10/2022 0117   GLUCOSE 110 (H) 03/10/2022 0117   BUN 36 (H) 03/10/2022 0117   CREATININE 2.03 (H) 03/10/2022 0117   CALCIUM 8.4 (L) 03/10/2022 0117   PROT 6.1 (L) 03/10/2022 0117   ALBUMIN 2.7 (L) 03/10/2022 0117   AST 21 03/10/2022 0117   ALT 28 03/10/2022 0117   ALKPHOS 225 (H) 03/10/2022 0117   BILITOT 0.7 03/10/2022 0117   GFRNONAA 25 (L) 03/10/2022 0117   GFRAA 35 (L) 10/30/2018 0617   Lipase     Component Value Date/Time   LIPASE 48 10/28/2018  1234       Studies/Results: ECHOCARDIOGRAM COMPLETE  Result Date: 03/09/2022    ECHOCARDIOGRAM REPORT   Patient Name:   Lynn Joseph Date of Exam: 03/09/2022 Medical Rec #:  [id number]         Height:       65.0 in Accession #:    [id number]        Weight:       227.1 lb Date of Birth:  1946/04/23         BSA:          2.088 m Patient Age:    76 years          BP:           146/71 mmHg Patient Gender: F                 HR:           105 bpm. Exam Location:  Inpatient Procedure: 2D Echo, Cardiac Doppler and Color Doppler Indications:    Atrial fibrillation  History:        Patient has prior history of Echocardiogram examinations, most                 recent 10/28/2018. CHF; Risk Factors:Hypertension.  Sonographer:    Clayton Lefort RDCS (AE) Referring Phys: Karmen Bongo  IMPRESSIONS  1. Left ventricular ejection fraction, by estimation, is 55 to 60%. The left ventricle has normal function. The left ventricle has no regional wall motion abnormalities. Left ventricular diastolic function could not be evaluated.  2. Right ventricular systolic function is normal. The right ventricular size is normal. There is normal pulmonary artery systolic pressure. The estimated right ventricular systolic pressure is 03.7 mmHg.  3. The mitral valve is grossly normal. Trivial mitral valve regurgitation. No evidence of mitral stenosis.  4. The aortic valve is tricuspid. There is mild calcification of the aortic valve. Aortic valve regurgitation is not visualized. Aortic valve sclerosis is present, with no evidence of aortic valve stenosis.  5. The inferior vena cava is normal in size with greater than 50% respiratory variability, suggesting right atrial pressure of 3 mmHg. Comparison(s): Changes from prior study are noted. EF unchanged. Patient now in Afib. FINDINGS  Left Ventricle: Left ventricular ejection fraction, by estimation, is 55 to 60%. The left ventricle has normal function. The left ventricle has no regional wall  motion abnormalities. The left ventricular internal cavity size was normal in size. There is  no left ventricular hypertrophy. Left ventricular diastolic function could not be evaluated due to atrial fibrillation. Left ventricular diastolic function could not be evaluated. Right Ventricle: The right ventricular size is normal. No increase in right ventricular wall thickness. Right ventricular systolic function is normal. There is normal pulmonary artery systolic pressure. The tricuspid regurgitant velocity is 1.79 m/s, and  with an assumed right atrial pressure of 3 mmHg, the estimated right ventricular systolic pressure is 04.8 mmHg. Left Atrium: Left atrial size was normal in size. Right Atrium: Right atrial size was normal in size. Pericardium: There is no evidence of pericardial effusion. Presence of epicardial fat layer. Mitral Valve: The mitral valve is grossly normal. Trivial mitral valve regurgitation. No evidence of mitral valve stenosis. Tricuspid Valve: The tricuspid valve is grossly normal. Tricuspid valve regurgitation is trivial. No evidence of tricuspid stenosis. Aortic Valve: The aortic valve is tricuspid. There is mild calcification of the aortic valve. Aortic valve regurgitation is not visualized. Aortic valve sclerosis is present, with no evidence of aortic valve stenosis. Aortic valve mean gradient measures 4.0 mmHg. Aortic valve peak gradient measures 6.4 mmHg. Aortic valve area, by VTI measures 2.59 cm. Pulmonic Valve: The pulmonic valve was grossly normal. Pulmonic valve regurgitation is trivial. No evidence of pulmonic stenosis. Aorta: The aortic root and ascending aorta are structurally normal, with no evidence of dilitation. Venous: The inferior vena cava is normal in size with greater than 50% respiratory variability, suggesting right atrial pressure of 3 mmHg. IAS/Shunts: The atrial septum is grossly normal.  LEFT VENTRICLE PLAX 2D LVIDd:         4.40 cm LVIDs:         2.70 cm LV PW:          1.20 cm LV IVS:        1.10 cm LVOT diam:     2.00 cm LV SV:         51 LV SV Index:   25 LVOT Area:     3.14 cm  RIGHT VENTRICLE             IVC RV Basal diam:  3.10 cm     IVC diam: 1.60 cm RV S prime:     12.90 cm/s TAPSE (M-mode): 1.8 cm LEFT ATRIUM           Index  RIGHT ATRIUM           Index LA diam:      4.20 cm 2.01 cm/m   RA Area:     16.30 cm LA Vol (A2C): 55.5 ml 26.58 ml/m  RA Volume:   42.50 ml  20.36 ml/m LA Vol (A4C): 61.0 ml 29.22 ml/m  AORTIC VALVE AV Area (Vmax):    2.49 cm AV Area (Vmean):   2.35 cm AV Area (VTI):     2.59 cm AV Vmax:           126.00 cm/s AV Vmean:          91.300 cm/s AV VTI:            0.198 m AV Peak Grad:      6.4 mmHg AV Mean Grad:      4.0 mmHg LVOT Vmax:         100.00 cm/s LVOT Vmean:        68.300 cm/s LVOT VTI:          0.163 m LVOT/AV VTI ratio: 0.82  AORTA Ao Root diam: 3.20 cm Ao Asc diam:  3.70 cm TRICUSPID VALVE TR Peak grad:   12.8 mmHg TR Vmax:        179.00 cm/s  SHUNTS Systemic VTI:  0.16 m Systemic Diam: 2.00 cm Eleonore Chiquito MD Electronically signed by Eleonore Chiquito MD Signature Date/Time: 03/09/2022/5:19:26 PM    Final    IR Sinus/Fist Tube Chk-Non GI  Result Date: 03/09/2022 INDICATION: 76 year old female who presents with a biliary drainage catheter placed at an outside institution. EXAM: SINUS TRACT INJECTION/FISTULOGRAM MEDICATIONS: None. ANESTHESIA/SEDATION: None. FLUOROSCOPY TIME:  Radiation exposure index: 2 mGy reference air kerma COMPLICATIONS: None immediate. PROCEDURE: Informed written consent was obtained from the patient after a thorough discussion of the procedural risks, benefits and alternatives. All questions were addressed. Maximal Sterile Barrier Technique was utilized including caps, mask, sterile gowns, sterile gloves, sterile drape, hand hygiene and skin antiseptic. A timeout was performed prior to the initiation of the procedure. A gentle hand injection of contrast material was performed. This is an 79 [place]  internal/external biliary drainage catheter. The drainage catheter is patent. No evidence of biliary ductal dilatation. Contrast material passes freely into the ampulla. Additionally, contrast material refluxes through the patent cystic duct and partially opacifies the collapsed gallbladder. IMPRESSION: 1. Eight [place] internal/external biliary drainage catheter is well-positioned and normally functioning. 2. Patent cystic duct. The internal/external biliary drainage catheter was capped. Electronically Signed   By: Jacqulynn Cadet M.D.   On: 03/09/2022 13:07   CT CHEST ABDOMEN PELVIS WO CONTRAST  Result Date: 03/08/2022 CLINICAL DATA:  Right upper quadrant pain, recent per chole tube. EXAM: CT CHEST, ABDOMEN AND PELVIS WITHOUT CONTRAST TECHNIQUE: Multidetector CT imaging of the chest, abdomen and pelvis was performed following the standard protocol without IV contrast. RADIATION DOSE REDUCTION: This exam was performed according to the departmental dose-optimization program which includes automated exposure control, adjustment of the mA and/or kV according to patient size and/or use of iterative reconstruction technique. COMPARISON:  None Available. FINDINGS: CT CHEST FINDINGS Cardiovascular: Normal heart size. No pericardial effusion. Normal caliber thoracic aorta with mild calcified plaque. No visible coronary artery calcifications. Mediastinum/Nodes: Small hiatal hernia. No pathologically enlarged lymph nodes seen in the chest Lungs/Pleura: Central airways are patent small right pleural effusion and right basilar atelectasis. No consolidation, pleural effusion or pneumothorax Musculoskeletal: No chest wall mass or suspicious bone lesions identified. CT ABDOMEN PELVIS FINDINGS  Hepatobiliary: Small subcapsular collection located adjacent to the percutaneous biliary drain measuring 5.3 x 1.3 cm. Decompressed gallbladder with gallstones evidence of biliary ductal dilation. Common bile duct drain in place. No  evidence of biliary ductal dilation. Pancreas: Unremarkable. No pancreatic ductal dilatation or surrounding inflammatory changes. Spleen: Normal in size without focal abnormality. Adrenals/Urinary Tract: Bilateral adrenal glands are unremarkable. No hydronephrosis or nephrolithiasis. Bladder is unremarkable. Stomach/Bowel: Postsurgical changes of the stomach. No bowel wall thickening, inflammatory change or evidence of obstruction. Normal appendix Vascular/Lymphatic: Aortic atherosclerosis. No enlarged abdominal or pelvic lymph nodes. Reproductive: Uterus and bilateral adnexa are unremarkable. Other: Trace high-density free fluid seen in the pelvis. Musculoskeletal: No acute or significant osseous findings. IMPRESSION: 1. Small subcapsular collection located adjacent to the percutaneous biliary drain, likely postprocedural hematoma or biloma. HIDA scan could assist with further evaluation. 2. Small right pleural effusion and right basilar atelectasis. 3. Trace high-density free fluid seen in the pelvis, likely due to small volume hemoperitoneum related to recent procedure. 4. Aortic Atherosclerosis (ICD10-I70.0). Electronically Signed   By: Yetta Glassman M.D.   On: 03/08/2022 15:48    Anti-infectives: Anti-infectives (From admission, onward)    Start     Dose/Rate Route Frequency Ordered Stop   03/09/22 1600  ceFEPIme (MAXIPIME) 2 g in sodium chloride 0.9 % 100 mL IVPB        2 g 200 mL/hr over 30 Minutes Intravenous Every 24 hours 03/08/22 1514     03/09/22 0300  metroNIDAZOLE (FLAGYL) IVPB 500 mg        500 mg 100 mL/hr over 60 Minutes Intravenous  Once 03/08/22 1835 03/09/22 0410   03/08/22 1515  ceFEPIme (MAXIPIME) 2 g in sodium chloride 0.9 % 100 mL IVPB        2 g 200 mL/hr over 30 Minutes Intravenous  Once 03/08/22 1507 03/08/22 1635   03/08/22 1515  metroNIDAZOLE (FLAGYL) IVPB 500 mg        500 mg 100 mL/hr over 60 Minutes Intravenous  Once 03/08/22 1507 03/08/22 1750         Assessment/Plan 76 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 She underwent PTC and balloon sweep of the CBD via percutaneous access in [place], and was discharged with a percutaneous biliary drain. She is now admitted with AKI and new-onset a-fib, presumably secondary to dehydration from PTC losses. Cholangiogram yesterday showed patency of the biliary tree and cystic duct, with visible filling defects in the common bile duct. She does not have cholecystitis. She previously had an [name] in 2020 demonstrating cholelithiasis.  She should have a cholecystectomy to prevent recurrent choledocholithiasis, but as she does not have cholecystitis and she has a functional drain in her common bile duct, there is no need to do this urgently. It would be best to allow time for her renal function to improve and to get better control of her a-fib. Once she is medically optimized, we will plan for cholecystectomy. I discussed that this would be easiest to do during this admission prior to discharge, but it would not be unreasonable to defer and perform as an outpatient surgery. The patient already has an appointment with Dr. Harlow Asa in early December and strongly prefers to keep that appointment.   We will continue to follow while inpatient. [place] for diet today. Remainder of medical management per primary team. Monitor LFTs, keep PTC capped.    LOS: 2 days  Michaelle Birks, MD [facility] Surgery General, Hepatobiliary and Pancreatic Surgery 03/10/22 7:26 AM

## 2022-03-10 NOTE — Progress Notes (Signed)
I had a discussion with cardiologist Dr. Quentin Ore regarding patient's clinical course. The patient is currently on max dilt gtt and metoprolol is being increased. Cardiology team would like to consider cardioversion but this would require 4 weeks anticoagulation without interruption.   I had a discussion with patient and via phone her friend who is an Therapist, sports. I have reiterated that the patient does not have any clinical or imaging findings to indicate that she has acute cholecystitis, so cholecystectomy may not improve her a-fib. As cardioversion is being considered though, I recommended we do a cholecystectomy this admission so that she can be fully anticoagulated without a future need to interrupt for elective cholecystectomy. She will still need to keep the biliary drain for 4-6 weeks before removal is safe. Would also prefer to allow more time for renal function to improve. Will tentatively plan for cholecystectomy tomorrow if renal function is better and there is reasonable rate control of a-fib.   Michaelle Birks, [place] [facility] General, Hepatobiliary and Pancreatic Surgery 03/10/22 12:25 PM

## 2022-03-10 NOTE — Progress Notes (Signed)
Rounding Note    Patient Name: Lynn Joseph Date of Encounter: 03/10/2022  [place] Cardiologist: Early Osmond, MD   Subjective   No acute events overnight  EF yesterday normal, 55%  Inpatient Medications    Scheduled Meds:  levothyroxine  125 mcg Oral Q0600   metoprolol tartrate  75 mg Oral TID   sodium chloride flush  3 mL Intravenous Q12H   Continuous Infusions:  ceFEPime (MAXIPIME) IV 2 g (03/09/22 1621)   diltiazem (CARDIZEM) infusion 5 mg/hr (03/09/22 2318)   lactated ringers 125 mL/hr at 03/10/22 0723   PRN Meds: acetaminophen **OR** acetaminophen, metoprolol tartrate, ondansetron **OR** ondansetron (ZOFRAN) IV, oxyCODONE   Vital Signs    Vitals:   03/09/22 2315 03/10/22 0405 03/10/22 0441 03/10/22 0825  BP: 128/72 131/64 123/63 (!) 143/77  Pulse: (!) 50 68  (!) 108  Resp: '18 15 16 18  '$ Temp: 97.9 F (36.6 C) 97.9 F (36.6 C) 97.8 F (36.6 C) 98.2 F (36.8 C)  TempSrc: Oral Oral Oral Oral  SpO2: 94% 92% (!) 82% 96%  Weight:  104.3 kg    Height:        Intake/Output Summary (Last 24 hours) at 03/10/2022 0949 Last data filed at 03/10/2022 0626 Gross per 24 hour  Intake 437.28 ml  Output 200 ml  Net 237.28 ml      03/10/2022    4:05 AM 03/08/2022    8:49 PM 03/08/2022    1:53 PM  Last 3 Weights  Weight (lbs) 230 lb 227 lb 1.2 oz 225 lb  Weight (kg) 104.327 kg 103 kg 102.059 kg      Telemetry    Atrial fibrillation with rapid ventricular rates into the 120s- Personally Reviewed  ECG    Personally Reviewed  Physical Exam   GEN: No acute distress.   Neck: No JVD Cardiac: Irregularly irregular, tachycardic, no murmurs, rubs, or gallops.  Respiratory: Clear to auscultation bilaterally. GI: Soft, nontender, non-distended  MS: No edema; No deformity. Neuro:  Nonfocal  Psych: Normal affect   Labs    High Sensitivity Troponin:  No results for input(s): "TROPONINIHS" in the last 720 hours.   Chemistry Recent  Labs  Lab 03/08/22 1404 03/09/22 0826 03/10/22 0117  NA 133* 139 137  K 4.5 4.1 3.8  CL 100 109 110  CO2 18* 15* 16*  GLUCOSE 121* 97 110*  BUN 49* 41* 36*  CREATININE 2.71* 2.27* 2.03*  CALCIUM 9.4 9.0 8.4*  MG 1.6* 2.1 1.8  PROT 7.9 7.0 6.1*  ALBUMIN 4.1 3.1* 2.7*  AST '16 21 21  '$ ALT 33 35 28  ALKPHOS 271* 277* 225*  BILITOT 0.9 0.8 0.7  GFRNONAA 18* 22* 25*  ANIONGAP '15 15 11    '$ Lipids No results for input(s): "CHOL", "TRIG", "HDL", "LABVLDL", "LDLCALC", "CHOLHDL" in the last 168 hours.  Hematology Recent Labs  Lab 03/08/22 1404 03/09/22 0826 03/10/22 0117  WBC 15.9* 11.2* 9.8  RBC 4.03 3.98 3.46*  HGB 11.0* 11.2* 9.8*  HCT 35.1* 34.2* 30.3*  MCV 87.1 85.9 87.6  MCH 27.3 28.1 28.3  MCHC 31.3 32.7 32.3  RDW 13.8 13.8 13.8  PLT 416* 334 296   Thyroid  Recent Labs  Lab 03/08/22 1433 03/08/22 2214  TSH 0.241*  --   FREET4  --  1.17*    BNP Recent Labs  Lab 03/08/22 1404  BNP 180.3*    DDimer No results for input(s): "DDIMER" in the last 168 hours.  Radiology    ECHOCARDIOGRAM COMPLETE  Result Date: 03/09/2022    ECHOCARDIOGRAM REPORT   Patient Name:   Lynn Joseph Date of Exam: 03/09/2022 Medical Rec #:  [id number]         Height:       65.0 in Accession #:    [id number]        Weight:       227.1 lb Date of Birth:  March 24, 1946         BSA:          2.088 m Patient Age:    63 years          BP:           146/71 mmHg Patient Gender: F                 HR:           105 bpm. Exam Location:  Inpatient Procedure: 2D Echo, Cardiac Doppler and Color Doppler Indications:    Atrial fibrillation  History:        Patient has prior history of Echocardiogram examinations, most                 recent 10/28/2018. CHF; Risk Factors:Hypertension.  Sonographer:    Clayton Lefort RDCS (AE) Referring Phys: Karmen Bongo IMPRESSIONS  1. Left ventricular ejection fraction, by estimation, is 55 to 60%. The left ventricle has normal function. The left ventricle has no regional wall  motion abnormalities. Left ventricular diastolic function could not be evaluated.  2. Right ventricular systolic function is normal. The right ventricular size is normal. There is normal pulmonary artery systolic pressure. The estimated right ventricular systolic pressure is 81.8 mmHg.  3. The mitral valve is grossly normal. Trivial mitral valve regurgitation. No evidence of mitral stenosis.  4. The aortic valve is tricuspid. There is mild calcification of the aortic valve. Aortic valve regurgitation is not visualized. Aortic valve sclerosis is present, with no evidence of aortic valve stenosis.  5. The inferior vena cava is normal in size with greater than 50% respiratory variability, suggesting right atrial pressure of 3 mmHg. Comparison(s): Changes from prior study are noted. EF unchanged. Patient now in Afib. FINDINGS  Left Ventricle: Left ventricular ejection fraction, by estimation, is 55 to 60%. The left ventricle has normal function. The left ventricle has no regional wall motion abnormalities. The left ventricular internal cavity size was normal in size. There is  no left ventricular hypertrophy. Left ventricular diastolic function could not be evaluated due to atrial fibrillation. Left ventricular diastolic function could not be evaluated. Right Ventricle: The right ventricular size is normal. No increase in right ventricular wall thickness. Right ventricular systolic function is normal. There is normal pulmonary artery systolic pressure. The tricuspid regurgitant velocity is 1.79 m/s, and  with an assumed right atrial pressure of 3 mmHg, the estimated right ventricular systolic pressure is 56.3 mmHg. Left Atrium: Left atrial size was normal in size. Right Atrium: Right atrial size was normal in size. Pericardium: There is no evidence of pericardial effusion. Presence of epicardial fat layer. Mitral Valve: The mitral valve is grossly normal. Trivial mitral valve regurgitation. No evidence of mitral valve  stenosis. Tricuspid Valve: The tricuspid valve is grossly normal. Tricuspid valve regurgitation is trivial. No evidence of tricuspid stenosis. Aortic Valve: The aortic valve is tricuspid. There is mild calcification of the aortic valve. Aortic valve regurgitation is not visualized. Aortic valve sclerosis is present, with no evidence of aortic  valve stenosis. Aortic valve mean gradient measures 4.0 mmHg. Aortic valve peak gradient measures 6.4 mmHg. Aortic valve area, by VTI measures 2.59 cm. Pulmonic Valve: The pulmonic valve was grossly normal. Pulmonic valve regurgitation is trivial. No evidence of pulmonic stenosis. Aorta: The aortic root and ascending aorta are structurally normal, with no evidence of dilitation. Venous: The inferior vena cava is normal in size with greater than 50% respiratory variability, suggesting right atrial pressure of 3 mmHg. IAS/Shunts: The atrial septum is grossly normal.  LEFT VENTRICLE PLAX 2D LVIDd:         4.40 cm LVIDs:         2.70 cm LV PW:         1.20 cm LV IVS:        1.10 cm LVOT diam:     2.00 cm LV SV:         51 LV SV Index:   25 LVOT Area:     3.14 cm  RIGHT VENTRICLE             IVC RV Basal diam:  3.10 cm     IVC diam: 1.60 cm RV S prime:     12.90 cm/s TAPSE (M-mode): 1.8 cm LEFT ATRIUM           Index        RIGHT ATRIUM           Index LA diam:      4.20 cm 2.01 cm/m   RA Area:     16.30 cm LA Vol (A2C): 55.5 ml 26.58 ml/m  RA Volume:   42.50 ml  20.36 ml/m LA Vol (A4C): 61.0 ml 29.22 ml/m  AORTIC VALVE AV Area (Vmax):    2.49 cm AV Area (Vmean):   2.35 cm AV Area (VTI):     2.59 cm AV Vmax:           126.00 cm/s AV Vmean:          91.300 cm/s AV VTI:            0.198 m AV Peak Grad:      6.4 mmHg AV Mean Grad:      4.0 mmHg LVOT Vmax:         100.00 cm/s LVOT Vmean:        68.300 cm/s LVOT VTI:          0.163 m LVOT/AV VTI ratio: 0.82  AORTA Ao Root diam: 3.20 cm Ao Asc diam:  3.70 cm TRICUSPID VALVE TR Peak grad:   12.8 mmHg TR Vmax:        179.00 cm/s   SHUNTS Systemic VTI:  0.16 m Systemic Diam: 2.00 cm Eleonore Chiquito MD Electronically signed by Eleonore Chiquito MD Signature Date/Time: 03/09/2022/5:19:26 PM    Final    IR Sinus/Fist Tube Chk-Non GI  Result Date: 03/09/2022 INDICATION: 76 year old female who presents with a biliary drainage catheter placed at an outside institution. EXAM: SINUS TRACT INJECTION/FISTULOGRAM MEDICATIONS: None. ANESTHESIA/SEDATION: None. FLUOROSCOPY TIME:  Radiation exposure index: 2 mGy reference air kerma COMPLICATIONS: None immediate. PROCEDURE: Informed written consent was obtained from the patient after a thorough discussion of the procedural risks, benefits and alternatives. All questions were addressed. Maximal Sterile Barrier Technique was utilized including caps, mask, sterile gowns, sterile gloves, sterile drape, hand hygiene and skin antiseptic. A timeout was performed prior to the initiation of the procedure. A gentle hand injection of contrast material was performed. This is an 22 [place] internal/external  biliary drainage catheter. The drainage catheter is patent. No evidence of biliary ductal dilatation. Contrast material passes freely into the ampulla. Additionally, contrast material refluxes through the patent cystic duct and partially opacifies the collapsed gallbladder. IMPRESSION: 1. Eight [place] internal/external biliary drainage catheter is well-positioned and normally functioning. 2. Patent cystic duct. The internal/external biliary drainage catheter was capped. Electronically Signed   By: Jacqulynn Cadet M.D.   On: 03/09/2022 13:07   CT CHEST ABDOMEN PELVIS WO CONTRAST  Result Date: 03/08/2022 CLINICAL DATA:  Right upper quadrant pain, recent per chole tube. EXAM: CT CHEST, ABDOMEN AND PELVIS WITHOUT CONTRAST TECHNIQUE: Multidetector CT imaging of the chest, abdomen and pelvis was performed following the standard protocol without IV contrast. RADIATION DOSE REDUCTION: This exam was performed according to  the departmental dose-optimization program which includes automated exposure control, adjustment of the mA and/or kV according to patient size and/or use of iterative reconstruction technique. COMPARISON:  None Available. FINDINGS: CT CHEST FINDINGS Cardiovascular: Normal heart size. No pericardial effusion. Normal caliber thoracic aorta with mild calcified plaque. No visible coronary artery calcifications. Mediastinum/Nodes: Small hiatal hernia. No pathologically enlarged lymph nodes seen in the chest Lungs/Pleura: Central airways are patent small right pleural effusion and right basilar atelectasis. No consolidation, pleural effusion or pneumothorax Musculoskeletal: No chest wall mass or suspicious bone lesions identified. CT ABDOMEN PELVIS FINDINGS Hepatobiliary: Small subcapsular collection located adjacent to the percutaneous biliary drain measuring 5.3 x 1.3 cm. Decompressed gallbladder with gallstones evidence of biliary ductal dilation. Common bile duct drain in place. No evidence of biliary ductal dilation. Pancreas: Unremarkable. No pancreatic ductal dilatation or surrounding inflammatory changes. Spleen: Normal in size without focal abnormality. Adrenals/Urinary Tract: Bilateral adrenal glands are unremarkable. No hydronephrosis or nephrolithiasis. Bladder is unremarkable. Stomach/Bowel: Postsurgical changes of the stomach. No bowel wall thickening, inflammatory change or evidence of obstruction. Normal appendix Vascular/Lymphatic: Aortic atherosclerosis. No enlarged abdominal or pelvic lymph nodes. Reproductive: Uterus and bilateral adnexa are unremarkable. Other: Trace high-density free fluid seen in the pelvis. Musculoskeletal: No acute or significant osseous findings. IMPRESSION: 1. Small subcapsular collection located adjacent to the percutaneous biliary drain, likely postprocedural hematoma or biloma. HIDA scan could assist with further evaluation. 2. Small right pleural effusion and right basilar  atelectasis. 3. Trace high-density free fluid seen in the pelvis, likely due to small volume hemoperitoneum related to recent procedure. 4. Aortic Atherosclerosis (ICD10-I70.0). Electronically Signed   By: Yetta Glassman M.D.   On: 03/08/2022 15:48      Assessment & Plan    Lynn Joseph is a 76 year old woman with new diagnosis atrial fibrillation in the setting of acute cholecystitis.  #Paroxysmal atrial fibrillation Rapidly conducted ventricular rates Increase metoprolol tartrate today in hopes of achieving better rate control Increase diltiazem drip to 10 I would not cardiovert right now given pending cholecystectomy Recommend anticoagulation once felt safe from a surgical perspective  I suspect it will be difficult to achieve adequate rate control for her atrial fibrillation until her inflammation/infection associated with cholecystitis has resolved.  I do not think the presence of atrial fibrillation should necessarily preclude her from undergoing cholecystectomy.  Ideally, would get slightly better rate control using a combination of IV and oral medications prior to going to the operating room.  For questions or updates, please contact [facility] [place] Please consult [email or web address] for contact info under        Signed, Vickie Epley, MD  03/10/2022, 9:49 AM

## 2022-03-10 NOTE — Progress Notes (Signed)
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  Plan was to pursue surgery as an outpatient for cholecystostomy.On admission, she was found to be in A-fib with RVR.  Cardiology, general surgery consulted.  Cardiology considering cardioversion at some point.  General surgery planning for cholecystectomy tomorrow  Assessment & Plan:  Principal Problem:   New onset atrial fibrillation (HCC) Active Problems:   Obesity   Hypothyroidism   Benign essential hypertension   Acute cholecystitis   AKI (acute kidney injury) ([place])   Common Bile Duct Obstruction s/p Perc Chole Tube CT abdomen pelvis with small subcapsular collection adjacent to percutaneous biliary drain, likely post procedural hematoma or biloma S/p cholangiogram by IR -> catheter well position and normally functioning, patent cystic duct.  Internal/external biliary drainage catheter was capped. Appreciate surgery assistance - plan is for lap chole 11/18. Continue ceftriaxone and Flagyl for now.  She does not complain of any abdominal pain today.   New onset atrial fibrillation with RVR Echo with EF 55-60%, no RWMA Currently on metoprolol, diltiazem gtt. cardiology contemplating cardioversion at some point.  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            DVT prophylaxis:Place and maintain sequential compression device Start: 03/09/22 1725 SCDs Start: 03/08/22 1835     Code Status: Full Code  Family Communication: Discussed with daughter Lynn Joseph on phone  Patient status:Inpatient  Patient is from :Home  Anticipated discharge ST:[facility]  Estimated DC date:3-4 days   Consultants: cardiology,surgery  Procedures:None   Antimicrobials:  Anti-infectives (From admission, onward)    Start     Dose/Rate Route Frequency Ordered Stop   03/09/22 1600  ceFEPIme (MAXIPIME) 2 g in sodium chloride 0.9 % 100 mL IVPB        2 g 200 mL/hr over 30 Minutes Intravenous Every 24 hours 03/08/22 1514     03/09/22 0300  metroNIDAZOLE (FLAGYL) IVPB 500 mg        500 mg 100 mL/hr over 60 Minutes Intravenous  Once 03/08/22 1835 03/09/22 0410   03/08/22 1515  ceFEPIme (MAXIPIME) 2 g in sodium chloride 0.9 % 100 mL IVPB        2 g 200 mL/hr over 30 Minutes Intravenous  Once 03/08/22 1507 03/08/22 1635   03/08/22 1515  metroNIDAZOLE (FLAGYL) IVPB 500 mg        500 mg 100 mL/hr over 60 Minutes Intravenous  Once 03/08/22 1507 03/08/22 1750       Subjective: Patient seen  and examined at bedside today.  Hemodynamically stable.  Heart rate has been better after increasing on Cardizem drip.  Denies any abdominal pain, nausea, vomiting or chest pain.  Lying comfortably in bed  Objective: Vitals:   03/10/22 0441 03/10/22 0825 03/10/22 1015 03/10/22 1239  BP: 123/63 (!) 143/77  118/73  Pulse:  (!) 108 (!) 153 67  Resp: '16 18  18  '$ Temp: 97.8 F (36.6 C) 98.2 F (36.8 C)  98 F (36.7  C)  TempSrc: Oral Oral  Oral  SpO2: (!) 82% 96%  92%  Weight:      Height:        Intake/Output Summary (Last 24 hours) at 03/10/2022 1305 Last data filed at 03/10/2022 1241 Gross per 24 hour  Intake 677.28 ml  Output 0 ml  Net 677.28 ml   Filed Weights   03/08/22 1353 03/08/22 2049 03/10/22 0405  Weight: 102.1 kg 103 kg 104.3 kg    Examination:  General exam: Overall comfortable, not in distress,pleasant elderly female HEENT: PERRL Respiratory system:  no wheezes or crackles  Cardiovascular system: irregularly irregular rhythm.  Gastrointestinal system: Abdomen is nondistended, soft and nontender.biliary drain Central nervous system: Alert and oriented Extremities: No edema, no clubbing ,no cyanosis Skin: No rashes, no ulcers,no icterus     Data Reviewed: I have personally reviewed following labs and imaging studies  CBC: Recent Labs  Lab 03/08/22 1404 03/09/22 0826 03/10/22 0117  WBC 15.9* 11.2* 9.8  NEUTROABS 12.6* 8.9* 7.1  HGB 11.0* 11.2* 9.8*  HCT 35.1* 34.2* 30.3*  MCV 87.1 85.9 87.6  PLT 416* 334 030   Basic Metabolic Panel: Recent Labs  Lab 03/08/22 1404 03/09/22 0826 03/10/22 0117  NA 133* 139 137  K 4.5 4.1 3.8  CL 100 109 110  CO2 18* 15* 16*  GLUCOSE 121* 97 110*  BUN 49* 41* 36*  CREATININE 2.71* 2.27* 2.03*  CALCIUM 9.4 9.0 8.4*  MG 1.6* 2.1 1.8  PHOS  --  4.4 3.7     Recent Results (from the past 240 hour(s))  Culture, blood (single)     Status: None (Preliminary result)   Collection Time: 03/08/22  3:47 PM   Specimen: Right Antecubital; Blood  Result Value Ref Range Status   Specimen Description   Final    RIGHT ANTECUBITAL BLOOD Performed at [facility], [address], [place], [place] [address]    Special Requests   Final    BOTTLES DRAWN AEROBIC AND ANAEROBIC Blood Culture adequate volume Performed at [facility], [address] [place], [place] [address]    Culture   Final    NO GROWTH 2  DAYS Performed at [facility], [place] [address], [place], [place] [address]    Report Status PENDING  Incomplete     Radiology Studies: ECHOCARDIOGRAM COMPLETE  Result Date: 03/09/2022    ECHOCARDIOGRAM REPORT   Patient Name:   Lynn Joseph Date of Exam: 03/09/2022 Medical Rec #:  [id number]         Height:       65.0 in Accession #:    [id number]        Weight:       227.1 lb Date of Birth:  20-Apr-1946         BSA:          2.088 m Patient Age:    59 years          BP:  146/71 mmHg Patient Gender: F                 HR:           105 bpm. Exam Location:  Inpatient Procedure: 2D Echo, Cardiac Doppler and Color Doppler Indications:    Atrial fibrillation  History:        Patient has prior history of Echocardiogram examinations, most                 recent 10/28/2018. CHF; Risk Factors:Hypertension.  Sonographer:    Clayton Lefort RDCS (AE) Referring Phys: Karmen Bongo IMPRESSIONS  1. Left ventricular ejection fraction, by estimation, is 55 to 60%. The left ventricle has normal function. The left ventricle has no regional wall motion abnormalities. Left ventricular diastolic function could not be evaluated.  2. Right ventricular systolic function is normal. The right ventricular size is normal. There is normal pulmonary artery systolic pressure. The estimated right ventricular systolic pressure is 18.2 mmHg.  3. The mitral valve is grossly normal. Trivial mitral valve regurgitation. No evidence of mitral stenosis.  4. The aortic valve is tricuspid. There is mild calcification of the aortic valve. Aortic valve regurgitation is not visualized. Aortic valve sclerosis is present, with no evidence of aortic valve stenosis.  5. The inferior vena cava is normal in size with greater than 50% respiratory variability, suggesting right atrial pressure of 3 mmHg. Comparison(s): Changes from prior study are noted. EF unchanged. Patient now in Afib. FINDINGS  Left Ventricle: Left ventricular ejection  fraction, by estimation, is 55 to 60%. The left ventricle has normal function. The left ventricle has no regional wall motion abnormalities. The left ventricular internal cavity size was normal in size. There is  no left ventricular hypertrophy. Left ventricular diastolic function could not be evaluated due to atrial fibrillation. Left ventricular diastolic function could not be evaluated. Right Ventricle: The right ventricular size is normal. No increase in right ventricular wall thickness. Right ventricular systolic function is normal. There is normal pulmonary artery systolic pressure. The tricuspid regurgitant velocity is 1.79 m/s, and  with an assumed right atrial pressure of 3 mmHg, the estimated right ventricular systolic pressure is 99.3 mmHg. Left Atrium: Left atrial size was normal in size. Right Atrium: Right atrial size was normal in size. Pericardium: There is no evidence of pericardial effusion. Presence of epicardial fat layer. Mitral Valve: The mitral valve is grossly normal. Trivial mitral valve regurgitation. No evidence of mitral valve stenosis. Tricuspid Valve: The tricuspid valve is grossly normal. Tricuspid valve regurgitation is trivial. No evidence of tricuspid stenosis. Aortic Valve: The aortic valve is tricuspid. There is mild calcification of the aortic valve. Aortic valve regurgitation is not visualized. Aortic valve sclerosis is present, with no evidence of aortic valve stenosis. Aortic valve mean gradient measures 4.0 mmHg. Aortic valve peak gradient measures 6.4 mmHg. Aortic valve area, by VTI measures 2.59 cm. Pulmonic Valve: The pulmonic valve was grossly normal. Pulmonic valve regurgitation is trivial. No evidence of pulmonic stenosis. Aorta: The aortic root and ascending aorta are structurally normal, with no evidence of dilitation. Venous: The inferior vena cava is normal in size with greater than 50% respiratory variability, suggesting right atrial pressure of 3 mmHg.  IAS/Shunts: The atrial septum is grossly normal.  LEFT VENTRICLE PLAX 2D LVIDd:         4.40 cm LVIDs:         2.70 cm LV PW:         1.20  cm LV IVS:        1.10 cm LVOT diam:     2.00 cm LV SV:         51 LV SV Index:   25 LVOT Area:     3.14 cm  RIGHT VENTRICLE             IVC RV Basal diam:  3.10 cm     IVC diam: 1.60 cm RV S prime:     12.90 cm/s TAPSE (M-mode): 1.8 cm LEFT ATRIUM           Index        RIGHT ATRIUM           Index LA diam:      4.20 cm 2.01 cm/m   RA Area:     16.30 cm LA Vol (A2C): 55.5 ml 26.58 ml/m  RA Volume:   42.50 ml  20.36 ml/m LA Vol (A4C): 61.0 ml 29.22 ml/m  AORTIC VALVE AV Area (Vmax):    2.49 cm AV Area (Vmean):   2.35 cm AV Area (VTI):     2.59 cm AV Vmax:           126.00 cm/s AV Vmean:          91.300 cm/s AV VTI:            0.198 m AV Peak Grad:      6.4 mmHg AV Mean Grad:      4.0 mmHg LVOT Vmax:         100.00 cm/s LVOT Vmean:        68.300 cm/s LVOT VTI:          0.163 m LVOT/AV VTI ratio: 0.82  AORTA Ao Root diam: 3.20 cm Ao Asc diam:  3.70 cm TRICUSPID VALVE TR Peak grad:   12.8 mmHg TR Vmax:        179.00 cm/s  SHUNTS Systemic VTI:  0.16 m Systemic Diam: 2.00 cm Eleonore Chiquito MD Electronically signed by Eleonore Chiquito MD Signature Date/Time: 03/09/2022/5:19:26 PM    Final    IR Sinus/Fist Tube Chk-Non GI  Result Date: 03/09/2022 INDICATION: 76 year old female who presents with a biliary drainage catheter placed at an outside institution. EXAM: SINUS TRACT INJECTION/FISTULOGRAM MEDICATIONS: None. ANESTHESIA/SEDATION: None. FLUOROSCOPY TIME:  Radiation exposure index: 2 mGy reference air kerma COMPLICATIONS: None immediate. PROCEDURE: Informed written consent was obtained from the patient after a thorough discussion of the procedural risks, benefits and alternatives. All questions were addressed. Maximal Sterile Barrier Technique was utilized including caps, mask, sterile gowns, sterile gloves, sterile drape, hand hygiene and skin antiseptic. A timeout was  performed prior to the initiation of the procedure. A gentle hand injection of contrast material was performed. This is an 65 [place] internal/external biliary drainage catheter. The drainage catheter is patent. No evidence of biliary ductal dilatation. Contrast material passes freely into the ampulla. Additionally, contrast material refluxes through the patent cystic duct and partially opacifies the collapsed gallbladder. IMPRESSION: 1. Eight [place] internal/external biliary drainage catheter is well-positioned and normally functioning. 2. Patent cystic duct. The internal/external biliary drainage catheter was capped. Electronically Signed   By: Jacqulynn Cadet M.D.   On: 03/09/2022 13:07   CT CHEST ABDOMEN PELVIS WO CONTRAST  Result Date: 03/08/2022 CLINICAL DATA:  Right upper quadrant pain, recent per chole tube. EXAM: CT CHEST, ABDOMEN AND PELVIS WITHOUT CONTRAST TECHNIQUE: Multidetector CT imaging of the chest, abdomen and pelvis was performed following the standard protocol without IV contrast.  RADIATION DOSE REDUCTION: This exam was performed according to the departmental dose-optimization program which includes automated exposure control, adjustment of the mA and/or kV according to patient size and/or use of iterative reconstruction technique. COMPARISON:  None Available. FINDINGS: CT CHEST FINDINGS Cardiovascular: Normal heart size. No pericardial effusion. Normal caliber thoracic aorta with mild calcified plaque. No visible coronary artery calcifications. Mediastinum/Nodes: Small hiatal hernia. No pathologically enlarged lymph nodes seen in the chest Lungs/Pleura: Central airways are patent small right pleural effusion and right basilar atelectasis. No consolidation, pleural effusion or pneumothorax Musculoskeletal: No chest wall mass or suspicious bone lesions identified. CT ABDOMEN PELVIS FINDINGS Hepatobiliary: Small subcapsular collection located adjacent to the percutaneous biliary drain  measuring 5.3 x 1.3 cm. Decompressed gallbladder with gallstones evidence of biliary ductal dilation. Common bile duct drain in place. No evidence of biliary ductal dilation. Pancreas: Unremarkable. No pancreatic ductal dilatation or surrounding inflammatory changes. Spleen: Normal in size without focal abnormality. Adrenals/Urinary Tract: Bilateral adrenal glands are unremarkable. No hydronephrosis or nephrolithiasis. Bladder is unremarkable. Stomach/Bowel: Postsurgical changes of the stomach. No bowel wall thickening, inflammatory change or evidence of obstruction. Normal appendix Vascular/Lymphatic: Aortic atherosclerosis. No enlarged abdominal or pelvic lymph nodes. Reproductive: Uterus and bilateral adnexa are unremarkable. Other: Trace high-density free fluid seen in the pelvis. Musculoskeletal: No acute or significant osseous findings. IMPRESSION: 1. Small subcapsular collection located adjacent to the percutaneous biliary drain, likely postprocedural hematoma or biloma. HIDA scan could assist with further evaluation. 2. Small right pleural effusion and right basilar atelectasis. 3. Trace high-density free fluid seen in the pelvis, likely due to small volume hemoperitoneum related to recent procedure. 4. Aortic Atherosclerosis (ICD10-I70.0). Electronically Signed   By: Yetta Glassman M.D.   On: 03/08/2022 15:48    Scheduled Meds:  levothyroxine  125 mcg Oral Q0600   metoprolol tartrate  75 mg Oral TID   sodium chloride flush  3 mL Intravenous Q12H   Continuous Infusions:  ceFEPime (MAXIPIME) IV 2 g (03/09/22 1621)   diltiazem (CARDIZEM) infusion 10 mg/hr (03/10/22 0953)   lactated ringers 125 mL/hr at 03/10/22 0723     LOS: 2 days   Shelly Coss, MD [facility] P11/18/2023, 1:05 PM

## 2022-03-10 NOTE — Progress Notes (Signed)
I received a request to call patient's daughter Mick Sell. I called the phone number listed in chart with no answer.

## 2022-03-11 ENCOUNTER — Inpatient Hospital Stay (HOSPITAL_COMMUNITY): Payer: Medicare Other | Admitting: General Practice

## 2022-03-11 ENCOUNTER — Encounter (HOSPITAL_COMMUNITY): Payer: Self-pay | Admitting: Internal Medicine

## 2022-03-11 ENCOUNTER — Other Ambulatory Visit: Payer: Self-pay

## 2022-03-11 ENCOUNTER — Encounter (HOSPITAL_COMMUNITY)
Admission: EM | Disposition: A | Discharge status: Home / Self Care | Payer: Self-pay | Source: Home / Self Care | Attending: Internal Medicine

## 2022-03-11 ENCOUNTER — Inpatient Hospital Stay (HOSPITAL_COMMUNITY): Payer: Medicare Other

## 2022-03-11 DIAGNOSIS — I10 Essential (primary) hypertension: Secondary | ICD-10-CM

## 2022-03-11 DIAGNOSIS — I4891 Unspecified atrial fibrillation: Secondary | ICD-10-CM | POA: Diagnosis not present

## 2022-03-11 DIAGNOSIS — D638 Anemia in other chronic diseases classified elsewhere: Secondary | ICD-10-CM

## 2022-03-11 DIAGNOSIS — E039 Hypothyroidism, unspecified: Secondary | ICD-10-CM

## 2022-03-11 DIAGNOSIS — K8070 Calculus of gallbladder and bile duct without cholecystitis without obstruction: Secondary | ICD-10-CM

## 2022-03-11 HISTORY — PX: CHOLECYSTECTOMY: SHX55

## 2022-03-11 LAB — CBC
HCT: 27.8 % — ABNORMAL LOW (ref 36.0–46.0)
Hemoglobin: 9 g/dL — ABNORMAL LOW (ref 12.0–15.0)
MCH: 28 pg (ref 26.0–34.0)
MCHC: 32.4 g/dL (ref 30.0–36.0)
MCV: 86.6 fL (ref 80.0–100.0)
Platelets: 266 10*3/uL (ref 150–400)
RBC: 3.21 MIL/uL — ABNORMAL LOW (ref 3.87–5.11)
RDW: 13.8 % (ref 11.5–15.5)
WBC: 9.2 10*3/uL (ref 4.0–10.5)
nRBC: 0 % (ref 0.0–0.2)

## 2022-03-11 LAB — BASIC METABOLIC PANEL
Anion gap: 7 (ref 5–15)
BUN: 30 mg/dL — ABNORMAL HIGH (ref 8–23)
CO2: 20 mmol/L — ABNORMAL LOW (ref 22–32)
Calcium: 8.1 mg/dL — ABNORMAL LOW (ref 8.9–10.3)
Chloride: 112 mmol/L — ABNORMAL HIGH (ref 98–111)
Creatinine, Ser: 1.93 mg/dL — ABNORMAL HIGH (ref 0.44–1.00)
GFR, Estimated: 27 mL/min — ABNORMAL LOW (ref >60)
Glucose, Bld: 104 mg/dL — ABNORMAL HIGH (ref 70–99)
Potassium: 4.3 mmol/L (ref 3.5–5.1)
Sodium: 139 mmol/L (ref 135–145)

## 2022-03-11 LAB — PROTIME-INR
INR: 1.3 — ABNORMAL HIGH (ref 0.8–1.2)
Prothrombin Time: 15.7 seconds — ABNORMAL HIGH (ref 11.4–15.2)

## 2022-03-11 LAB — SURGICAL PCR SCREEN
MRSA, PCR: NEGATIVE
Staphylococcus aureus: NEGATIVE

## 2022-03-11 LAB — TYPE AND SCREEN
ABO/RH(D): A POS
Antibody Screen: NEGATIVE

## 2022-03-11 LAB — BRAIN NATRIURETIC PEPTIDE: B Natriuretic Peptide: 660 pg/mL — ABNORMAL HIGH (ref 0.0–100.0)

## 2022-03-11 LAB — ABO/RH: ABO/RH(D): A POS

## 2022-03-11 SURGERY — LAPAROSCOPIC CHOLECYSTECTOMY
Anesthesia: General

## 2022-03-11 MED ORDER — DEXAMETHASONE SODIUM PHOSPHATE 10 MG/ML IJ SOLN
INTRAMUSCULAR | Status: AC
  Filled 2022-03-11: qty 1

## 2022-03-11 MED ORDER — FENTANYL CITRATE (PF) 250 MCG/5ML IJ SOLN
INTRAMUSCULAR | Status: DC | PRN
  Administered 2022-03-11: 100 ug via INTRAVENOUS
  Administered 2022-03-11: 50 ug via INTRAVENOUS

## 2022-03-11 MED ORDER — DEXAMETHASONE SODIUM PHOSPHATE 10 MG/ML IJ SOLN
INTRAMUSCULAR | Status: DC | PRN
  Administered 2022-03-11: 5 mg via INTRAVENOUS

## 2022-03-11 MED ORDER — BUPIVACAINE-EPINEPHRINE 0.25% -1:200000 IJ SOLN
INTRAMUSCULAR | Status: DC | PRN
  Administered 2022-03-11: 25 mL

## 2022-03-11 MED ORDER — SODIUM CHLORIDE 0.9 % IR SOLN
Status: DC | PRN
  Administered 2022-03-11: 500 mL

## 2022-03-11 MED ORDER — CELECOXIB 200 MG PO CAPS
200.0000 mg | ORAL_CAPSULE | Freq: Once | ORAL | Status: DC
  Filled 2022-03-11: qty 1

## 2022-03-11 MED ORDER — CELECOXIB 200 MG PO CAPS
ORAL_CAPSULE | ORAL | Status: AC
  Filled 2022-03-11: qty 1

## 2022-03-11 MED ORDER — LACTATED RINGERS IV SOLN: INTRAVENOUS | Status: DC | PRN

## 2022-03-11 MED ORDER — 0.9 % SODIUM CHLORIDE (POUR BTL) OPTIME
TOPICAL | Status: DC | PRN
  Administered 2022-03-11: 500 mL

## 2022-03-11 MED ORDER — ROCURONIUM BROMIDE 10 MG/ML (PF) SYRINGE
PREFILLED_SYRINGE | INTRAVENOUS | Status: DC | PRN
  Administered 2022-03-11: 20 mg via INTRAVENOUS
  Administered 2022-03-11: 60 mg via INTRAVENOUS

## 2022-03-11 MED ORDER — LIDOCAINE 2% (20 MG/ML) 5 ML SYRINGE
INTRAMUSCULAR | Status: AC
  Filled 2022-03-11: qty 5

## 2022-03-11 MED ORDER — DROPERIDOL 2.5 MG/ML IJ SOLN: 0.6250 mg | Freq: Once | INTRAMUSCULAR | Status: DC | PRN

## 2022-03-11 MED ORDER — SUGAMMADEX SODIUM 200 MG/2ML IV SOLN
INTRAVENOUS | Status: DC | PRN
  Administered 2022-03-11: 300 mg via INTRAVENOUS

## 2022-03-11 MED ORDER — ROCURONIUM BROMIDE 10 MG/ML (PF) SYRINGE
PREFILLED_SYRINGE | INTRAVENOUS | Status: AC
  Filled 2022-03-11: qty 10

## 2022-03-11 MED ORDER — ONDANSETRON HCL 4 MG/2ML IJ SOLN
INTRAMUSCULAR | Status: AC
  Filled 2022-03-11: qty 2

## 2022-03-11 MED ORDER — PROPOFOL 10 MG/ML IV BOLUS
INTRAVENOUS | Status: DC | PRN
  Administered 2022-03-11: 130 mg via INTRAVENOUS

## 2022-03-11 MED ORDER — LACTATED RINGERS IV SOLN: INTRAVENOUS | Status: DC

## 2022-03-11 MED ORDER — PHENYLEPHRINE HCL-NACL 20-0.9 MG/250ML-% IV SOLN
INTRAVENOUS | Status: DC | PRN
  Administered 2022-03-11: 30 ug/min via INTRAVENOUS

## 2022-03-11 MED ORDER — ONDANSETRON HCL 4 MG/2ML IJ SOLN
INTRAMUSCULAR | Status: DC | PRN
  Administered 2022-03-11: 4 mg via INTRAVENOUS

## 2022-03-11 MED ORDER — EPHEDRINE SULFATE-NACL 50-0.9 MG/10ML-% IV SOSY
PREFILLED_SYRINGE | INTRAVENOUS | Status: DC | PRN
  Administered 2022-03-11 (×2): 5 mg via INTRAVENOUS

## 2022-03-11 MED ORDER — ACETAMINOPHEN 500 MG PO TABS
ORAL_TABLET | ORAL | Status: AC
  Filled 2022-03-11: qty 2

## 2022-03-11 MED ORDER — EPHEDRINE 5 MG/ML INJ
INTRAVENOUS | Status: AC
  Filled 2022-03-11: qty 5

## 2022-03-11 MED ORDER — FENTANYL CITRATE (PF) 250 MCG/5ML IJ SOLN
INTRAMUSCULAR | Status: AC
  Filled 2022-03-11: qty 5

## 2022-03-11 MED ORDER — HYDROMORPHONE HCL 1 MG/ML IJ SOLN
0.2500 mg | INTRAMUSCULAR | Status: DC | PRN
  Administered 2022-03-11: 0.5 mg via INTRAVENOUS

## 2022-03-11 MED ORDER — ACETAMINOPHEN 500 MG PO TABS: 1000.0000 mg | ORAL_TABLET | Freq: Once | ORAL | Status: DC

## 2022-03-11 MED ORDER — PROPOFOL 10 MG/ML IV BOLUS
INTRAVENOUS | Status: AC
  Filled 2022-03-11: qty 20

## 2022-03-11 MED ORDER — CHLORHEXIDINE GLUCONATE 0.12 % MT SOLN
15.0000 mL | Freq: Once | OROMUCOSAL | Status: AC
  Administered 2022-03-11: 15 mL via OROMUCOSAL

## 2022-03-11 MED ORDER — GABAPENTIN 100 MG PO CAPS
100.0000 mg | ORAL_CAPSULE | Freq: Every evening | ORAL | Status: DC | PRN
  Administered 2022-03-11 – 2022-03-12 (×2): 100 mg via ORAL
  Filled 2022-03-11 (×2): qty 1

## 2022-03-11 MED ORDER — ORAL CARE MOUTH RINSE: 15.0000 mL | Freq: Once | OROMUCOSAL | Status: AC

## 2022-03-11 MED ORDER — LIDOCAINE 2% (20 MG/ML) 5 ML SYRINGE
INTRAMUSCULAR | Status: DC | PRN
  Administered 2022-03-11: 100 mg via INTRAVENOUS

## 2022-03-11 MED ORDER — HYDROMORPHONE HCL 1 MG/ML IJ SOLN
INTRAMUSCULAR | Status: AC
  Administered 2022-03-11: 0.5 mg via INTRAVENOUS
  Filled 2022-03-11: qty 1

## 2022-03-11 MED ORDER — MUPIROCIN 2 % EX OINT
1.0000 | TOPICAL_OINTMENT | Freq: Two times a day (BID) | CUTANEOUS | Status: DC
  Administered 2022-03-11 – 2022-03-13 (×4): 1 via NASAL
  Filled 2022-03-11 (×2): qty 22

## 2022-03-11 MED ORDER — BUPIVACAINE-EPINEPHRINE (PF) 0.25% -1:200000 IJ SOLN
INTRAMUSCULAR | Status: AC
  Filled 2022-03-11: qty 30

## 2022-03-11 SURGICAL SUPPLY — 44 items
ADH SKN CLS APL DERMABOND .7 (GAUZE/BANDAGES/DRESSINGS) ×1
APL PRP STRL LF DISP 70% ISPRP (MISCELLANEOUS) ×1
APPLIER CLIP 5 13 M/L LIGAMAX5 (MISCELLANEOUS) ×1
APR CLP MED LRG 5 ANG JAW (MISCELLANEOUS) ×1
BAG COUNTER SPONGE SURGICOUNT (BAG) ×1 IMPLANT
BAG SPEC RTRVL LRG 6X4 10 (ENDOMECHANICALS) ×1
BAG SPNG CNTER NS LX DISP (BAG) ×1
CANISTER SUCT 3000ML PPV (MISCELLANEOUS) ×1 IMPLANT
CHLORAPREP W/TINT 26 (MISCELLANEOUS) ×1 IMPLANT
CLIP APPLIE 5 13 M/L LIGAMAX5 (MISCELLANEOUS) ×1 IMPLANT
COVER SURGICAL LIGHT HANDLE (MISCELLANEOUS) ×1 IMPLANT
DERMABOND ADVANCED .7 DNX12 (GAUZE/BANDAGES/DRESSINGS) ×1 IMPLANT
ELECT REM PT RETURN 9FT ADLT (ELECTROSURGICAL) ×1
ELECTRODE REM PT RTRN 9FT ADLT (ELECTROSURGICAL) ×1 IMPLANT
ENDOLOOP SUT PDS II  0 18 (SUTURE) ×2
ENDOLOOP SUT PDS II 0 18 (SUTURE) IMPLANT
GLOVE BIOGEL PI IND STRL 6 (GLOVE) ×1 IMPLANT
GLOVE BIOGEL PI MICRO STRL 5.5 (GLOVE) ×1 IMPLANT
GOWN STRL REUS W/ TWL LRG LVL3 (GOWN DISPOSABLE) ×3 IMPLANT
GOWN STRL REUS W/TWL LRG LVL3 (GOWN DISPOSABLE) ×3
KIT BASIN OR (CUSTOM PROCEDURE TRAY) ×1 IMPLANT
KIT TURNOVER KIT B (KITS) ×1 IMPLANT
L-HOOK LAP DISP 36CM (ELECTROSURGICAL) ×1
LHOOK LAP DISP 36CM (ELECTROSURGICAL) ×1 IMPLANT
NDL INSUFFLATION 14GA 120MM (NEEDLE) IMPLANT
NEEDLE INSUFFLATION 14GA 120MM (NEEDLE) IMPLANT
NS IRRIG 1000ML POUR BTL (IV SOLUTION) ×1 IMPLANT
PAD ARMBOARD 7.5X6 YLW CONV (MISCELLANEOUS) ×1 IMPLANT
PENCIL BUTTON HOLSTER BLD 10FT (ELECTRODE) ×1 IMPLANT
POUCH SPECIMEN RETRIEVAL 10MM (ENDOMECHANICALS) IMPLANT
SCISSORS LAP 5X35 DISP (ENDOMECHANICALS) ×1 IMPLANT
SET IRRIG TUBING LAPAROSCOPIC (IRRIGATION / IRRIGATOR) ×1 IMPLANT
SET TUBE SMOKE EVAC HIGH FLOW (TUBING) ×1 IMPLANT
SLEEVE ENDOPATH XCEL 5M (ENDOMECHANICALS) ×2 IMPLANT
SLEEVE Z-THREAD 5X100MM (TROCAR) IMPLANT
SPONGE DRAIN TRACH 4X4 STRL 2S (GAUZE/BANDAGES/DRESSINGS) IMPLANT
SUT MNCRL AB 4-0 PS2 18 (SUTURE) ×1 IMPLANT
TOWEL GREEN STERILE (TOWEL DISPOSABLE) ×1 IMPLANT
TRAY LAPAROSCOPIC MC (CUSTOM PROCEDURE TRAY) ×1 IMPLANT
TROCAR XCEL 12X100 BLDLESS (ENDOMECHANICALS) IMPLANT
TROCAR XCEL BLUNT TIP 100MML (ENDOMECHANICALS) ×1 IMPLANT
TROCAR Z-THREAD OPTICAL 5X100M (TROCAR) ×1 IMPLANT
WARMER LAPAROSCOPE (MISCELLANEOUS) ×1 IMPLANT
WATER STERILE IRR 1000ML POUR (IV SOLUTION) ×1 IMPLANT

## 2022-03-11 NOTE — Progress Notes (Signed)
Attempted to decrease cardizem gTT to 7.5 from 10. Patient HR elevated to 120s-130s. Patient denied SOB, difficulty breathing, and CP. Cardizem increased back to 10 and patient's HR was 95-115. Will continue to monitor.

## 2022-03-11 NOTE — Progress Notes (Signed)
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  Plan was to pursue surgery as an outpatient for cholecystostomy.On admission, she was found to be in A-fib with RVR.  Cardiology, general surgery consulted.  Cardiology considering cardioversion at some point.  General surgery planning for cholecystectomy tomorrow  Assessment & Plan:  Common Bile Duct Obstruction s/p Perc Chole Tube CT abdomen pelvis with small subcapsular collection adjacent to percutaneous biliary drain, likely post procedural hematoma or biloma S/p cholangiogram by IR -> catheter well position and normally functioning, patent cystic duct.  Internal/external biliary drainage catheter was capped. Appreciate surgery assistance - plan is for lap chole on 03/11/2022, general surgery has explained the risks and benefits to daughter and patient in detail on 03/11/2022.  They are willing to proceed with the procedure. Continue ceftriaxone and Flagyl for now.  She does not complain of any abdominal pain today.   New onset atrial fibrillation with RVR Echo with EF 55-60%, no RWMA Currently on metoprolol, diltiazem gtt. cardiology contemplating cardioversion at some point.  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  Also on oral sodium bicarbonate, outpatient follow-up with nephrology postdischarge once stable.   Hyperthyroidism vs Sick euthyroid Mildly suppressed TSH could be sick  euthyroid, repeat TSH with free T4 and T3 on 03/12/2022.   Hypertension Current medications to be continued for now  Normocytic anemia  Hemoglobin currently stable in the range of 9-10   Obesity  Hx Roux en y gastric bypass Body mass index is 37.79 kg/m.            DVT prophylaxis:Place and maintain sequential compression device Start: 03/09/22 1725 SCDs Start: 03/08/22 1835     Code Status: Full Code  Family Communication: Discussed with daughter [name] on phone, she had called patient's phone on 03/11/2022.  Patient status:Inpatient  Patient is from :Home  Anticipated discharge KZ:[facility]  Estimated DC date:3-4 days   Consultants: cardiology, surgery    Subjective:  Patient in bed, appears comfortable, denies any headache, no fever, no chest pain or pressure, no shortness of breath , no abdominal pain. No new focal weakness.   Objective: Vitals:   03/10/22 2310 03/11/22 0428 03/11/22 0737 03/11/22 0829  BP: (!) 99/58  (!) 130/54   Pulse: 62  (!) 120   Resp: 17  18   Temp: 97.7 F (36.5 C)  97.8 F (36.6 C)   TempSrc: Oral  Oral   SpO2: 93%  100%   Weight:  106.7 kg  106.7 kg  Height:    '5\' 5"'$  (1.651 m)    Intake/Output Summary (Last 24 hours) at 03/11/2022 0857 Last data filed at 03/11/2022 0717 Gross per 24 hour  Intake 3643.02 ml  Output 225 ml  Net 3418.02 ml   Filed Weights   03/10/22 0405 03/11/22 0428 03/11/22 0829  Weight: 104.3 kg 106.7 kg 106.7 kg    Examination:  Awake Alert, No new F.N deficits, Normal affect [place].AT,PERRAL Supple Neck, No JVD,   Symmetrical  Chest wall movement, Good air movement bilaterally, CTAB iRRR,No Gallops, Rubs or new Murmurs,  +ve B.Sounds, Abd Soft, No tenderness, RUQ quadrant gallbladder drain. No Cyanosis, Clubbing or edema     Data Reviewed: I have personally reviewed following labs and imaging studies  Recent Labs  Lab 03/08/22 1404 03/09/22 0826 03/10/22 0117 03/11/22 0056  WBC 15.9*  11.2* 9.8 9.2  HGB 11.0* 11.2* 9.8* 9.0*  HCT 35.1* 34.2* 30.3* 27.8*  PLT 416* 334 296 266  MCV 87.1 85.9 87.6 86.6  MCH 27.3 28.1 28.3 28.0  MCHC 31.3 32.7 32.3 32.4  RDW 13.8 13.8 13.8 13.8  LYMPHSABS 1.4 1.0 1.3  --   MONOABS 1.6* 1.0 1.1*  --   EOSABS 0.1 0.1 0.1  --   BASOSABS 0.1 0.0 0.1  --     Recent Labs  Lab 03/08/22 1404 03/08/22 1433 03/09/22 0826 03/10/22 0117 03/11/22 0056 03/11/22 0650  NA 133*  --  139 137 139  --   K 4.5  --  4.1 3.8 4.3  --   CL 100  --  109 110 112*  --   CO2 18*  --  15* 16* 20*  --   GLUCOSE 121*  --  97 110* 104*  --   BUN 49*  --  41* 36* 30*  --   CREATININE 2.71*  --  2.27* 2.03* 1.93*  --   AST 16  --  21 21  --   --   ALT 33  --  35 28  --   --   ALKPHOS 271*  --  277* 225*  --   --   BILITOT 0.9  --  0.8 0.7  --   --   ALBUMIN 4.1  --  3.1* 2.7*  --   --   LATICACIDVEN  --  0.8  --   --   --   --   INR  --   --   --   --   --  1.3*  TSH  --  0.241*  --   --   --   --   BNP 180.3*  --   --   --   --  660.0*  MG 1.6*  --  2.1 1.8  --   --   CALCIUM 9.4  --  9.0 8.4* 8.1*  --     Radiology Studies: DG Chest Port 1 View  Result Date: 03/11/2022 CLINICAL DATA:  [id number] with shortness of breath and atrial fibrillation. EXAM: PORTABLE CHEST 1 VIEW COMPARISON:  Chest CT without contrast 03/08/2022 FINDINGS: 6:57 a.m. The heart is enlarged. Central vascular prominence is again noted. There is no overt edema. Moderately elevated right hemidiaphragm again is seen and an overlying small or moderate sized right pleural effusion. Right lower lung field is largely obscured, previously demonstrating atelectasis or consolidation in the lower lobe alongside the effusion. The remaining lungs are clear. The overall aeration seems unchanged. Aortic tortuosity and calcification are again noted.  Osteopenia. IMPRESSION: 1. Cardiomegaly with central vascular prominence. No overt edema. 2. Small to moderate sized right pleural effusion. 3. Right lower  lung field is obscured, on CT demonstrating atelectasis or consolidation in the lower lobe alongside the effusion. No new abnormality is suspected. Electronically Signed   By: Telford Nab M.D.   On: 03/11/2022 07:10   ECHOCARDIOGRAM COMPLETE  Result Date: 03/09/2022    ECHOCARDIOGRAM REPORT   Patient Name:   KIONI STAHL Date of  Exam: 03/09/2022 Medical Rec #:  [id number]         Height:       65.0 in Accession #:    [id number]        Weight:       227.1 lb Date of Birth:  01-12-1946         BSA:          2.088 m Patient Age:    68 years          BP:           146/71 mmHg Patient Gender: F                 HR:           105 bpm. Exam Location:  Inpatient Procedure: 2D Echo, Cardiac Doppler and Color Doppler Indications:    Atrial fibrillation  History:        Patient has prior history of Echocardiogram examinations, most                 recent 10/28/2018. CHF; Risk Factors:Hypertension.  Sonographer:    Clayton Lefort RDCS (AE) Referring Phys: Karmen Bongo IMPRESSIONS  1. Left ventricular ejection fraction, by estimation, is 55 to 60%. The left ventricle has normal function. The left ventricle has no regional wall motion abnormalities. Left ventricular diastolic function could not be evaluated.  2. Right ventricular systolic function is normal. The right ventricular size is normal. There is normal pulmonary artery systolic pressure. The estimated right ventricular systolic pressure is 99.3 mmHg.  3. The mitral valve is grossly normal. Trivial mitral valve regurgitation. No evidence of mitral stenosis.  4. The aortic valve is tricuspid. There is mild calcification of the aortic valve. Aortic valve regurgitation is not visualized. Aortic valve sclerosis is present, with no evidence of aortic valve stenosis.  5. The inferior vena cava is normal in size with greater than 50% respiratory variability, suggesting right atrial pressure of 3 mmHg. Comparison(s): Changes from prior study are noted. EF unchanged.  Patient now in Afib. FINDINGS  Left Ventricle: Left ventricular ejection fraction, by estimation, is 55 to 60%. The left ventricle has normal function. The left ventricle has no regional wall motion abnormalities. The left ventricular internal cavity size was normal in size. There is  no left ventricular hypertrophy. Left ventricular diastolic function could not be evaluated due to atrial fibrillation. Left ventricular diastolic function could not be evaluated. Right Ventricle: The right ventricular size is normal. No increase in right ventricular wall thickness. Right ventricular systolic function is normal. There is normal pulmonary artery systolic pressure. The tricuspid regurgitant velocity is 1.79 m/s, and  with an assumed right atrial pressure of 3 mmHg, the estimated right ventricular systolic pressure is 71.6 mmHg. Left Atrium: Left atrial size was normal in size. Right Atrium: Right atrial size was normal in size. Pericardium: There is no evidence of pericardial effusion. Presence of epicardial fat layer. Mitral Valve: The mitral valve is grossly normal. Trivial mitral valve regurgitation. No evidence of mitral valve stenosis. Tricuspid Valve: The tricuspid valve is grossly normal. Tricuspid valve regurgitation is trivial. No evidence of tricuspid stenosis. Aortic Valve: The aortic valve is tricuspid. There is mild calcification of the aortic valve. Aortic valve regurgitation is not visualized. Aortic valve sclerosis is present, with no evidence of aortic valve stenosis. Aortic valve mean gradient measures 4.0 mmHg. Aortic valve peak gradient measures 6.4 mmHg. Aortic valve area, by VTI measures 2.59 cm. Pulmonic Valve: The  pulmonic valve was grossly normal. Pulmonic valve regurgitation is trivial. No evidence of pulmonic stenosis. Aorta: The aortic root and ascending aorta are structurally normal, with no evidence of dilitation. Venous: The inferior vena cava is normal in size with greater than 50%  respiratory variability, suggesting right atrial pressure of 3 mmHg. IAS/Shunts: The atrial septum is grossly normal.  LEFT VENTRICLE PLAX 2D LVIDd:         4.40 cm LVIDs:         2.70 cm LV PW:         1.20 cm LV IVS:        1.10 cm LVOT diam:     2.00 cm LV SV:         51 LV SV Index:   25 LVOT Area:     3.14 cm  RIGHT VENTRICLE             IVC RV Basal diam:  3.10 cm     IVC diam: 1.60 cm RV S prime:     12.90 cm/s TAPSE (M-mode): 1.8 cm LEFT ATRIUM           Index        RIGHT ATRIUM           Index LA diam:      4.20 cm 2.01 cm/m   RA Area:     16.30 cm LA Vol (A2C): 55.5 ml 26.58 ml/m  RA Volume:   42.50 ml  20.36 ml/m LA Vol (A4C): 61.0 ml 29.22 ml/m  AORTIC VALVE AV Area (Vmax):    2.49 cm AV Area (Vmean):   2.35 cm AV Area (VTI):     2.59 cm AV Vmax:           126.00 cm/s AV Vmean:          91.300 cm/s AV VTI:            0.198 m AV Peak Grad:      6.4 mmHg AV Mean Grad:      4.0 mmHg LVOT Vmax:         100.00 cm/s LVOT Vmean:        68.300 cm/s LVOT VTI:          0.163 m LVOT/AV VTI ratio: 0.82  AORTA Ao Root diam: 3.20 cm Ao Asc diam:  3.70 cm TRICUSPID VALVE TR Peak grad:   12.8 mmHg TR Vmax:        179.00 cm/s  SHUNTS Systemic VTI:  0.16 m Systemic Diam: 2.00 cm Eleonore Chiquito MD Electronically signed by Eleonore Chiquito MD Signature Date/Time: 03/09/2022/5:19:26 PM    Final    IR Sinus/Fist Tube Chk-Non GI  Result Date: 03/09/2022 INDICATION: 76 year old female who presents with a biliary drainage catheter placed at an outside institution. EXAM: SINUS TRACT INJECTION/FISTULOGRAM MEDICATIONS: None. ANESTHESIA/SEDATION: None. FLUOROSCOPY TIME:  Radiation exposure index: 2 mGy reference air kerma COMPLICATIONS: None immediate. PROCEDURE: Informed written consent was obtained from the patient after a thorough discussion of the procedural risks, benefits and alternatives. All questions were addressed. Maximal Sterile Barrier Technique was utilized including caps, mask, sterile gowns, sterile  gloves, sterile drape, hand hygiene and skin antiseptic. A timeout was performed prior to the initiation of the procedure. A gentle hand injection of contrast material was performed. This is an 63 [place] internal/external biliary drainage catheter. The drainage catheter is patent. No evidence of biliary ductal dilatation. Contrast material passes freely into the ampulla. Additionally, contrast material refluxes through  the patent cystic duct and partially opacifies the collapsed gallbladder. IMPRESSION: 1. Eight [place] internal/external biliary drainage catheter is well-positioned and normally functioning. 2. Patent cystic duct. The internal/external biliary drainage catheter was capped. Electronically Signed   By: Jacqulynn Cadet M.D.   On: 03/09/2022 13:07    Scheduled Meds:  acetaminophen  1,000 mg Oral Once   celecoxib  200 mg Oral Once   [MAR Hold] levothyroxine  125 mcg Oral Q0600   [MAR Hold] metoprolol tartrate  75 mg Oral TID   [MAR Hold] mupirocin ointment  1 Application Nasal BID   [MAR Hold] sodium bicarbonate  650 mg Oral TID   [MAR Hold] sodium chloride flush  3 mL Intravenous Q12H   Continuous Infusions:  [MAR Hold] cefTRIAXone (ROCEPHIN)  IV Stopped (03/10/22 1635)   diltiazem (CARDIZEM) infusion 10 mg/hr (03/11/22 0827)   lactated ringers 75 mL/hr at 03/11/22 0827   lactated ringers 10 mL/hr at 03/11/22 0850   [MAR Hold] metronidazole Stopped (03/11/22 0635)     LOS: 3 days   Lala Lund, MD [facility] P11/19/2023, 8:57 AM

## 2022-03-11 NOTE — Op Note (Signed)
Date: 03/11/22  Patient: Lynn Joseph MRN: [id number]  Preoperative Diagnosis: Cholelithiasis with a history of choledocholithiasis Postoperative Diagnosis: Same  Procedure: Laparoscopic lysis of adhesions, laparoscopic cholecystectomy  Surgeon: Michaelle Birks, MD Assistant: Georganna Skeans, MD  EBL: Minimal  Anesthesia: General endotracheal  Specimens: Gallbladder  Indications: [name] is a 76 yo female who was recently hospitalized in [place] abdominal pain and was found to have choledocholithiasis.  She has a history of Roux-en-Y gastric bypass, and underwent percutaneous transhepatic cholangiography with balloon sweep of the biliary tree to clear the stones.  A percutaneous biliary drain was left in place to drainage.  She returned to [facility] and was admitted with dehydration, acute kidney injury, and onset atrial fibrillation.  She has been hydrated with improvement in her AKI, and rate control of her A-fib has improved.  She has no clinical signs of acute cholecystitis, and tube cholangiogram 2 days ago did not show any filling defects in the biliary tree.  Cholecystectomy was recommended to prevent recurrent choledocholithiasis, and after discussion of the risks and benefits of surgery, the patient agreed to proceed.  Findings: Adhesions to the previous upper midline surgical scar.  Cholelithiasis without signs of acute cholecystitis.  Procedure details: Informed consent was obtained in the preoperative area prior to the procedure. The patient was brought to the operating room and placed on the table in the supine position.  General anesthesia was induced and appropriate lines and drains were placed for intraoperative monitoring. Perioperative antibiotics were administered per SCIP guidelines. The abdomen was prepped and draped in the usual sterile fashion. A pre-procedure timeout was taken verifying patient identity, surgical site and procedure to be performed.  A small  infraumbilical skin incision was made, the subcutaneous tissue was divided with cautery, and the umbilical stalk was grasped and elevated. The fascia was incised and the peritoneal cavity was directly visualized. A 63m Hassan trocar was placed and the abdomen was insufflated. The peritoneal cavity was inspected with no evidence of visceral or vascular injury.  There were signs of omental adhesions to the abdominal wall at the previous midline incision in the left upper quadrant.  The right upper quadrant seemed mostly clear of adhesions.  A 5 mm port was placed in the right lateral costal margin, and an additional 5 mm port was placed in the right mid abdomen to facilitate adhesiolysis.  The adhesions to the midline were then taken down sharply to facilitate visualization.  In the medial left upper quadrant, there appeared to be either stomach or bowel adherent to the abdominal wall.  This was left in place as it was not in the way of port placement.  It was closely examined and there were no signs of injury.  Additional 5 mm ports were placed in the right mid subcostal margin and at the midline epigastric area.  The drain tract of the PTC was visible in the right lobe of the liver.  The fundus of the gallbladder was visible and was grasped, but there were adhesions to the infundibulum of the gallbladder.  These were taken down using cautery and gentle blunt dissection.  More medially, the duodenum was adherent to the gallbladder via thin filmy adhesions, which were divided sharply.  This allowed visualization of the entire gallbladder.  The fundus was grasped and retracted cephalad, and the infundibulum was retracted laterally. The cystic triangle was dissected out using cautery and blunt dissection, and the critical view of safety was obtained.  The cystic artery was  clipped and divided.  The cystic duct appeared dilated.  The cystic duct was clipped and divided.  On division of the duct, there were stones  visible in the cystic duct stump.  The clip on the cystic duct stump was removed, and two more pigmented stones were milked out of the cystic duct stump.  At this point the duct stump was soft with no evidence of remaining stones.  A PDS Endoloop was used to close the cystic duct stump.  The gallbladder was taken off the liver using cautery.  The posterior branch of the cystic artery was clipped during this dissection.  The specimen was completely taken off the liver and placed in an endocatch bag. The surgical site was irrigated with saline until the effluent was clear. Hemostasis was achieved in the gallbladder fossa using cautery. The cystic duct and artery stumps were visually inspected and there was no evidence of bile leak or bleeding.  The adherent bowel in the left upper quadrant was again examined, and did not have any signs of injury.  The ports were removed under direct visualization and the abdomen was desufflated. The specimen was extracted via the umbilical port site. The umbilical port site fascia was closed with a 0 vicryl suture. The skin at all port sites was closed with 4-0 monocryl subcuticular suture. Dermabond was applied.  Patient tolerated the procedure well with no apparent complications.  All counts were correct x2 at the end of the procedure. The patient was extubated and taken to PACU in stable condition.  Michaelle Birks, MD 03/11/22 2:13 PM

## 2022-03-11 NOTE — Anesthesia Preprocedure Evaluation (Addendum)
Anesthesia Evaluation    Reviewed: Allergy & Precautions, Patient's Chart, lab work & pertinent test results, reviewed documented beta blocker date and time , Unable to perform ROS - Chart review only  Airway Mallampati: II  TM Distance: >3 FB Neck ROM: Full    Dental  (+) Dental Advisory Given, Teeth Intact, Caps   Pulmonary neg pulmonary ROS   Pulmonary exam normal breath sounds clear to auscultation       Cardiovascular hypertension, Pt. on medications and Pt. on home beta blockers Normal cardiovascular exam Rhythm:Regular Rate:Normal  Echo 02/2022  1. Left ventricular ejection fraction, by estimation, is 55 to 60%. The left ventricle has normal function. The left ventricle has no regional wall motion abnormalities. Left ventricular diastolic function could not be evaluated.   2. Right ventricular systolic function is normal. The right ventricular size is normal. There is normal pulmonary artery systolic pressure. The estimated right ventricular systolic pressure is 84.5 mmHg.   3. The mitral valve is grossly normal. Trivial mitral valve regurgitation. No evidence of mitral stenosis.   4. The aortic valve is tricuspid. There is mild calcification of the aortic valve. Aortic valve regurgitation is not visualized. Aortic valve sclerosis is present, with no evidence of aortic valve stenosis.   5. The inferior vena cava is normal in size with greater than 50% respiratory variability, suggesting right atrial pressure of 3 mmHg.   Comparison(s): Changes from prior study are noted. EF unchanged. Patient  now in Afib.    NM Stress 12/2021 Pharmacological myocardial perfusion imaging study with no significant  ischemia Normal wall motion, EF estimated at 59% No EKG changes concerning for ischemia at peak stress or in recovery. CT attenuation correction images with no significant coronary calcification, minimal aortic atherosclerosis Low risk  scan    Neuro/Psych negative neurological ROS     GI/Hepatic negative GI ROS, Neg liver ROS,,,  Endo/Other  Hypothyroidism    Renal/GU Renal InsufficiencyRenal disease     Musculoskeletal negative musculoskeletal ROS (+)    Abdominal  (+) + obese  Peds  Hematology  (+) Blood dyscrasia, anemia   Anesthesia Other Findings   Reproductive/Obstetrics                              Anesthesia Physical Anesthesia Plan  ASA: 3  Anesthesia Plan: General   Post-op Pain Management: Tylenol PO (pre-op)* and Celebrex PO (pre-op)*   Induction: Intravenous  PONV Risk Score and Plan: 4 or greater and Ondansetron, Dexamethasone and Treatment may vary due to age or medical condition  Airway Management Planned: Oral ETT  Additional Equipment:   Intra-op Plan:   Post-operative Plan: Extubation in OR  Informed Consent: I have reviewed the patients History and Physical, chart, labs and discussed the procedure including the risks, benefits and alternatives for the proposed anesthesia with the patient or authorized representative who has indicated his/her understanding and acceptance.     Dental advisory given  Plan Discussed with: CRNA  Anesthesia Plan Comments:         Anesthesia Quick Evaluation

## 2022-03-11 NOTE — Anesthesia Procedure Notes (Signed)
Procedure Name: Intubation Date/Time: 03/11/2022 10:12 AM  Performed by: Dorann Lodge, CRNAPre-anesthesia Checklist: Patient identified, Emergency Drugs available, Suction available and Patient being monitored Patient Re-evaluated:Patient Re-evaluated prior to induction Oxygen Delivery Method: Circle System Utilized Preoxygenation: Pre-oxygenation with 100% oxygen Induction Type: IV induction Ventilation: Mask ventilation without difficulty Laryngoscope Size: Mac and 3 Grade View: Grade II Tube type: Oral Tube size: 7.0 mm Number of attempts: 1 Airway Equipment and Method: Stylet Placement Confirmation: ETT inserted through vocal cords under direct vision, positive ETCO2 and breath sounds checked- equal and bilateral Secured at: 22 cm Tube secured with: Tape Dental Injury: Teeth and Oropharynx as per pre-operative assessment

## 2022-03-11 NOTE — Progress Notes (Signed)
Laparoscopic cholecystectomy completed. No signs of acute cholecystitis intra-op, no indication for antibiotics from a surgical standpoint. [place] for regular diet. Check hemoglobin tomorrow morning and if stable, ok to begin therapeutic anticoagulation 24 hours postop.

## 2022-03-11 NOTE — Progress Notes (Signed)
Day of Surgery  Subjective: Creatinine downtrending. Afebrile.  Objective: Vital signs in last 24 hours: Temp:  [97.7 F (36.5 C)-98.1 F (36.7 C)] 97.8 F (36.6 C) (11/19 0737) Pulse Rate:  [62-153] 120 (11/19 0737) Resp:  [17-20] 18 (11/19 0737) BP: (99-143)/(54-73) 130/54 (11/19 0737) SpO2:  [92 %-100 %] 100 % (11/19 0737) Weight:  [106.7 kg] 106.7 kg (11/19 0829) Last BM Date : 03/10/22  Intake/Output from previous day: 11/18 0701 - 11/19 0700 In: 3409.8 [P.O.:720; I.V.:2380.9; IV Piggyback:308.9] Out: 225 [Urine:225] Intake/Output this shift: Total I/O In: 233.3 [I.V.:141.7; IV Piggyback:91.6] Out: -   PE: General: resting comfortably, NAD Neuro: alert and oriented, no focal deficits Resp: normal work of breathing CV: HR 80s, irregular rhythm Abdomen: soft, nondistended, nontender to palpation. PTC in place RUQ, capped. Extremities: warm and well-perfused   Lab Results:  Recent Labs    03/10/22 0117 03/11/22 0056  WBC 9.8 9.2  HGB 9.8* 9.0*  HCT 30.3* 27.8*  PLT 296 266   BMET Recent Labs    03/10/22 0117 03/11/22 0056  NA 137 139  K 3.8 4.3  CL 110 112*  CO2 16* 20*  GLUCOSE 110* 104*  BUN 36* 30*  CREATININE 2.03* 1.93*  CALCIUM 8.4* 8.1*   PT/INR Recent Labs    03/11/22 0650  LABPROT 15.7*  INR 1.3*   CMP     Component Value Date/Time   NA 139 03/11/2022 0056   K 4.3 03/11/2022 0056   CL 112 (H) 03/11/2022 0056   CO2 20 (L) 03/11/2022 0056   GLUCOSE 104 (H) 03/11/2022 0056   BUN 30 (H) 03/11/2022 0056   CREATININE 1.93 (H) 03/11/2022 0056   CALCIUM 8.1 (L) 03/11/2022 0056   PROT 6.1 (L) 03/10/2022 0117   ALBUMIN 2.7 (L) 03/10/2022 0117   AST 21 03/10/2022 0117   ALT 28 03/10/2022 0117   ALKPHOS 225 (H) 03/10/2022 0117   BILITOT 0.7 03/10/2022 0117   GFRNONAA 27 (L) 03/11/2022 0056   GFRAA 35 (L) 10/30/2018 0617   Lipase     Component Value Date/Time   LIPASE 48 10/28/2018 1234       Studies/Results: DG Chest  Port 1 View  Result Date: 03/11/2022 CLINICAL DATA:  [id number] with shortness of breath and atrial fibrillation. EXAM: PORTABLE CHEST 1 VIEW COMPARISON:  Chest CT without contrast 03/08/2022 FINDINGS: 6:57 a.m. The heart is enlarged. Central vascular prominence is again noted. There is no overt edema. Moderately elevated right hemidiaphragm again is seen and an overlying small or moderate sized right pleural effusion. Right lower lung field is largely obscured, previously demonstrating atelectasis or consolidation in the lower lobe alongside the effusion. The remaining lungs are clear. The overall aeration seems unchanged. Aortic tortuosity and calcification are again noted.  Osteopenia. IMPRESSION: 1. Cardiomegaly with central vascular prominence. No overt edema. 2. Small to moderate sized right pleural effusion. 3. Right lower lung field is obscured, on CT demonstrating atelectasis or consolidation in the lower lobe alongside the effusion. No new abnormality is suspected. Electronically Signed   By: Telford Nab M.D.   On: 03/11/2022 07:10   ECHOCARDIOGRAM COMPLETE  Result Date: 03/09/2022    ECHOCARDIOGRAM REPORT   Patient Name:   Lynn Joseph Date of Exam: 03/09/2022 Medical Rec #:  [id number]         Height:       65.0 in Accession #:    [id number]        Weight:  227.1 lb Date of Birth:  [birth year]         BSA:          2.088 m Patient Age:    76 years          BP:           146/71 mmHg Patient Gender: F                 HR:           105 bpm. Exam Location:  Inpatient Procedure: 2D Echo, Cardiac Doppler and Color Doppler Indications:    Atrial fibrillation  History:        Patient has prior history of Echocardiogram examinations, most                 recent 10/28/2018. CHF; Risk Factors:Hypertension.  Sonographer:    Clayton Lefort RDCS (AE) Referring Phys: Karmen Bongo IMPRESSIONS  1. Left ventricular ejection fraction, by estimation, is 55 to 60%. The left ventricle has normal function. The left  ventricle has no regional wall motion abnormalities. Left ventricular diastolic function could not be evaluated.  2. Right ventricular systolic function is normal. The right ventricular size is normal. There is normal pulmonary artery systolic pressure. The estimated right ventricular systolic pressure is 66.2 mmHg.  3. The mitral valve is grossly normal. Trivial mitral valve regurgitation. No evidence of mitral stenosis.  4. The aortic valve is tricuspid. There is mild calcification of the aortic valve. Aortic valve regurgitation is not visualized. Aortic valve sclerosis is present, with no evidence of aortic valve stenosis.  5. The inferior vena cava is normal in size with greater than 50% respiratory variability, suggesting right atrial pressure of 3 mmHg. Comparison(s): Changes from prior study are noted. EF unchanged. Patient now in Afib. FINDINGS  Left Ventricle: Left ventricular ejection fraction, by estimation, is 55 to 60%. The left ventricle has normal function. The left ventricle has no regional wall motion abnormalities. The left ventricular internal cavity size was normal in size. There is  no left ventricular hypertrophy. Left ventricular diastolic function could not be evaluated due to atrial fibrillation. Left ventricular diastolic function could not be evaluated. Right Ventricle: The right ventricular size is normal. No increase in right ventricular wall thickness. Right ventricular systolic function is normal. There is normal pulmonary artery systolic pressure. The tricuspid regurgitant velocity is 1.79 m/s, and  with an assumed right atrial pressure of 3 mmHg, the estimated right ventricular systolic pressure is 94.7 mmHg. Left Atrium: Left atrial size was normal in size. Right Atrium: Right atrial size was normal in size. Pericardium: There is no evidence of pericardial effusion. Presence of epicardial fat layer. Mitral Valve: The mitral valve is grossly normal. Trivial mitral valve  regurgitation. No evidence of mitral valve stenosis. Tricuspid Valve: The tricuspid valve is grossly normal. Tricuspid valve regurgitation is trivial. No evidence of tricuspid stenosis. Aortic Valve: The aortic valve is tricuspid. There is mild calcification of the aortic valve. Aortic valve regurgitation is not visualized. Aortic valve sclerosis is present, with no evidence of aortic valve stenosis. Aortic valve mean gradient measures 4.0 mmHg. Aortic valve peak gradient measures 6.4 mmHg. Aortic valve area, by VTI measures 2.59 cm. Pulmonic Valve: The pulmonic valve was grossly normal. Pulmonic valve regurgitation is trivial. No evidence of pulmonic stenosis. Aorta: The aortic root and ascending aorta are structurally normal, with no evidence of dilitation. Venous: The inferior vena cava is normal in size with greater than 50%  respiratory variability, suggesting right atrial pressure of 3 mmHg. IAS/Shunts: The atrial septum is grossly normal.  LEFT VENTRICLE PLAX 2D LVIDd:         4.40 cm LVIDs:         2.70 cm LV PW:         1.20 cm LV IVS:        1.10 cm LVOT diam:     2.00 cm LV SV:         51 LV SV Index:   25 LVOT Area:     3.14 cm  RIGHT VENTRICLE             IVC RV Basal diam:  3.10 cm     IVC diam: 1.60 cm RV S prime:     12.90 cm/s TAPSE (M-mode): 1.8 cm LEFT ATRIUM           Index        RIGHT ATRIUM           Index LA diam:      4.20 cm 2.01 cm/m   RA Area:     16.30 cm LA Vol (A2C): 55.5 ml 26.58 ml/m  RA Volume:   42.50 ml  20.36 ml/m LA Vol (A4C): 61.0 ml 29.22 ml/m  AORTIC VALVE AV Area (Vmax):    2.49 cm AV Area (Vmean):   2.35 cm AV Area (VTI):     2.59 cm AV Vmax:           126.00 cm/s AV Vmean:          91.300 cm/s AV VTI:            0.198 m AV Peak Grad:      6.4 mmHg AV Mean Grad:      4.0 mmHg LVOT Vmax:         100.00 cm/s LVOT Vmean:        68.300 cm/s LVOT VTI:          0.163 m LVOT/AV VTI ratio: 0.82  AORTA Ao Root diam: 3.20 cm Ao Asc diam:  3.70 cm TRICUSPID VALVE TR Peak  grad:   12.8 mmHg TR Vmax:        179.00 cm/s  SHUNTS Systemic VTI:  0.16 m Systemic Diam: 2.00 cm Eleonore Chiquito MD Electronically signed by Eleonore Chiquito MD Signature Date/Time: 03/09/2022/5:19:26 PM    Final    IR Sinus/Fist Tube Chk-Non GI  Result Date: 03/09/2022 INDICATION: 76 year old female who presents with a biliary drainage catheter placed at an outside institution. EXAM: SINUS TRACT INJECTION/FISTULOGRAM MEDICATIONS: None. ANESTHESIA/SEDATION: None. FLUOROSCOPY TIME:  Radiation exposure index: 2 mGy reference air kerma COMPLICATIONS: None immediate. PROCEDURE: Informed written consent was obtained from the patient after a thorough discussion of the procedural risks, benefits and alternatives. All questions were addressed. Maximal Sterile Barrier Technique was utilized including caps, mask, sterile gowns, sterile gloves, sterile drape, hand hygiene and skin antiseptic. A timeout was performed prior to the initiation of the procedure. A gentle hand injection of contrast material was performed. This is an 81 [place] internal/external biliary drainage catheter. The drainage catheter is patent. No evidence of biliary ductal dilatation. Contrast material passes freely into the ampulla. Additionally, contrast material refluxes through the patent cystic duct and partially opacifies the collapsed gallbladder. IMPRESSION: 1. Eight [place] internal/external biliary drainage catheter is well-positioned and normally functioning. 2. Patent cystic duct. The internal/external biliary drainage catheter was capped. Electronically Signed   By: Dellis Filbert.D.  On: 03/09/2022 13:07    Anti-infectives: Anti-infectives (From admission, onward)    Start     Dose/Rate Route Frequency Ordered Stop   03/10/22 1400  [MAR Hold]  cefTRIAXone (ROCEPHIN) 2 g in sodium chloride 0.9 % 100 mL IVPB        (MAR Hold since Sun 03/11/2022 at 0826.Hold Reason: Transfer to a Procedural area)   2 g 200 mL/hr over 30 Minutes  Intravenous Every 24 hours 03/10/22 1310     03/10/22 1400  [MAR Hold]  metroNIDAZOLE (FLAGYL) IVPB 500 mg        (MAR Hold since Sun 03/11/2022 at 0826.Hold Reason: Transfer to a Procedural area)   500 mg 100 mL/hr over 60 Minutes Intravenous Every 8 hours 03/10/22 1310     03/09/22 1600  ceFEPIme (MAXIPIME) 2 g in sodium chloride 0.9 % 100 mL IVPB  Status:  Discontinued        2 g 200 mL/hr over 30 Minutes Intravenous Every 24 hours 03/08/22 1514 03/10/22 1310   03/09/22 0300  metroNIDAZOLE (FLAGYL) IVPB 500 mg        500 mg 100 mL/hr over 60 Minutes Intravenous  Once 03/08/22 1835 03/09/22 0410   03/08/22 1515  ceFEPIme (MAXIPIME) 2 g in sodium chloride 0.9 % 100 mL IVPB        2 g 200 mL/hr over 30 Minutes Intravenous  Once 03/08/22 1507 03/08/22 1635   03/08/22 1515  metroNIDAZOLE (FLAGYL) IVPB 500 mg        500 mg 100 mL/hr over 60 Minutes Intravenous  Once 03/08/22 1507 03/08/22 1750        Assessment/Plan 76 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 She underwent PTC and balloon sweep of the CBD via percutaneous access in [place], and was discharged with a percutaneous biliary drain. She is now admitted with AKI and new-onset a-fib, presumably secondary to dehydration from PTC losses. Cholangiogram 11/17 showed patency of the biliary tree and cystic duct, with visible filling defects in the common bile duct. She does not have cholecystitis. She previously had an [name] in 2020 demonstrating cholelithiasis. - Renal function improving - OR today for laparoscopic possible open cholecystectomy. Patient has previously had an open surgery and I discussed if extensive adhesions are present, conversion to laparotomy may be necessary. She expressed understanding and agrees to proceed with surgery.  - NPO for OR - I discussed the plan with the patient and via phone her daughter.    LOS: 3 days    Michaelle Birks,  MD [facility] General, Hepatobiliary and Pancreatic Surgery 03/11/22 8:33 AM

## 2022-03-11 NOTE — Transfer of Care (Signed)
Immediate Anesthesia Transfer of Care Note  Patient: Lynn Joseph  Procedure(s) Performed: LAPAROSCOPIC CHOLECYSTECTOMY  Patient Location: PACU  Anesthesia Type:General  Level of Consciousness: awake and alert   Airway & Oxygen Therapy: Patient Spontanous Breathing  Post-op Assessment: Report given to RN and Post -op Vital signs reviewed and stable  Post vital signs: Reviewed and stable  Last Vitals:  Vitals Value Taken Time  BP 140/107 03/11/22 1148  Temp 36.5 C 03/11/22 1145  Pulse 68 03/11/22 1150  Resp 11 03/11/22 1150  SpO2 91 % 03/11/22 1150  Vitals shown include unvalidated device data.  Last Pain:  Vitals:   03/11/22 1145  TempSrc:   PainSc: Asleep      Patients Stated Pain Goal: 0 (75/44/92 0100)  Complications: No notable events documented.

## 2022-03-11 NOTE — Progress Notes (Signed)
Pt returned from OR.  Pt is A&O X4 and neuro intact. Pt placed on telemetry and CCMD notified.  Vitals taken and within normal range.  Pt is currently comfortable and not in pain.  All surgical incisions are clean, dry, and intact with no signs of bleeding or hematomas.  Pt is oriented to unit with call in reach and daughter at bedside.        03/11/22 1258  Vitals  Temp 97.8 F (36.6 C)  Temp Source Oral  BP (!) 139/55  MAP (mmHg) 81  BP Location Right Arm  BP Method Automatic  Patient Position (if appropriate) Lying  Pulse Rate 69  Pulse Rate Source Monitor  ECG Heart Rate 70  Resp 15  Level of Consciousness  Level of Consciousness Alert  Oxygen Therapy  SpO2 97 %  O2 Device Nasal Cannula  O2 Flow Rate (L/min) 2 L/min  Pain Assessment  Pain Scale 0-10  Pain Score 0  POSS Scale ([place]ero Opioid Sedation Scale)  POSS *See Group Information* 1-Acceptable,Awake and alert  PCA/Epidural/Spinal Assessment  Respiratory Pattern Regular;Unlabored  Glasgow Coma Scale  Eye Opening 4  Best Verbal Response (NON-intubated) 5  Best Motor Response 6  Glasgow Coma Scale Score 15  MEWS Score  MEWS Temp 0  MEWS Systolic 0  MEWS Pulse 0  MEWS RR 0  MEWS LOC 0  MEWS Score 0  MEWS Score Color Green

## 2022-03-11 NOTE — Anesthesia Postprocedure Evaluation (Signed)
Anesthesia Post Note  Patient: Lynn Joseph  Procedure(s) Performed: LAPAROSCOPIC CHOLECYSTECTOMY     Patient location during evaluation: PACU Anesthesia Type: General Level of consciousness: sedated and patient cooperative Pain management: pain level controlled Vital Signs Assessment: post-procedure vital signs reviewed and stable Respiratory status: spontaneous breathing Cardiovascular status: stable Anesthetic complications: no   No notable events documented.  Last Vitals:  Vitals:   03/11/22 1215 03/11/22 1230  BP: (!) 138/56 (!) 136/55  Pulse: 66 67  Resp: 10 12  Temp:    SpO2: 94% 97%    Last Pain:  Vitals:   03/11/22 1215  TempSrc:   PainSc: [place]

## 2022-03-11 NOTE — Progress Notes (Signed)
PT Cancellation Note  Patient Details Name: Lynn Joseph MRN: [id number] DOB: 1945-11-29   Cancelled Treatment:    Reason Eval/Treat Not Completed: Patient at procedure or test/unavailable  Wyona Almas, PT, DPT Acute Rehabilitation Services Office [place] 03/11/2022, 8:35 AM

## 2022-03-11 NOTE — Progress Notes (Signed)
OT Cancellation Note  Patient Details Name: Lynn Joseph MRN: [id number] DOB: 1945/10/28   Cancelled Treatment:    Reason Eval/Treat Not Completed: Patient at procedure or test/ unavailable. Golden Circle, OTR/L Acute Rehab Services [facility] [place] [phone] Office [phone]    Almon Register 03/11/2022, 8:43 AM

## 2022-03-12 ENCOUNTER — Other Ambulatory Visit (HOSPITAL_COMMUNITY): Payer: Self-pay

## 2022-03-12 ENCOUNTER — Encounter (HOSPITAL_COMMUNITY): Payer: Self-pay | Admitting: Surgery

## 2022-03-12 DIAGNOSIS — K810 Acute cholecystitis: Secondary | ICD-10-CM

## 2022-03-12 DIAGNOSIS — I4891 Unspecified atrial fibrillation: Secondary | ICD-10-CM | POA: Diagnosis not present

## 2022-03-12 LAB — MAGNESIUM: Magnesium: 1.4 mg/dL — ABNORMAL LOW (ref 1.7–2.4)

## 2022-03-12 LAB — CBC WITH DIFFERENTIAL/PLATELET
Abs Immature Granulocytes: 0.15 10*3/uL — ABNORMAL HIGH (ref 0.00–0.07)
Basophils Absolute: 0 10*3/uL (ref 0.0–0.1)
Basophils Relative: 0 %
Eosinophils Absolute: 0 10*3/uL (ref 0.0–0.5)
Eosinophils Relative: 0 %
HCT: 26.6 % — ABNORMAL LOW (ref 36.0–46.0)
Hemoglobin: 8.3 g/dL — ABNORMAL LOW (ref 12.0–15.0)
Immature Granulocytes: 1 %
Lymphocytes Relative: 3 %
Lymphs Abs: 0.6 10*3/uL — ABNORMAL LOW (ref 0.7–4.0)
MCH: 28.1 pg (ref 26.0–34.0)
MCHC: 31.2 g/dL (ref 30.0–36.0)
MCV: 90.2 fL (ref 80.0–100.0)
Monocytes Absolute: 0.6 10*3/uL (ref 0.1–1.0)
Monocytes Relative: 3 %
Neutro Abs: 15.4 10*3/uL — ABNORMAL HIGH (ref 1.7–7.7)
Neutrophils Relative %: 93 %
Platelets: 250 10*3/uL (ref 150–400)
RBC: 2.95 MIL/uL — ABNORMAL LOW (ref 3.87–5.11)
RDW: 13.7 % (ref 11.5–15.5)
WBC: 16.7 10*3/uL — ABNORMAL HIGH (ref 4.0–10.5)
nRBC: 0 % (ref 0.0–0.2)

## 2022-03-12 LAB — COMPREHENSIVE METABOLIC PANEL
ALT: 26 U/L (ref 0–44)
AST: 23 U/L (ref 15–41)
Albumin: 2.4 g/dL — ABNORMAL LOW (ref 3.5–5.0)
Alkaline Phosphatase: 143 U/L — ABNORMAL HIGH (ref 38–126)
Anion gap: 10 (ref 5–15)
BUN: 26 mg/dL — ABNORMAL HIGH (ref 8–23)
CO2: 20 mmol/L — ABNORMAL LOW (ref 22–32)
Calcium: 8.2 mg/dL — ABNORMAL LOW (ref 8.9–10.3)
Chloride: 108 mmol/L (ref 98–111)
Creatinine, Ser: 1.76 mg/dL — ABNORMAL HIGH (ref 0.44–1.00)
GFR, Estimated: 30 mL/min — ABNORMAL LOW (ref >60)
Glucose, Bld: 155 mg/dL — ABNORMAL HIGH (ref 70–99)
Potassium: 4.7 mmol/L (ref 3.5–5.1)
Sodium: 138 mmol/L (ref 135–145)
Total Bilirubin: 0.6 mg/dL (ref 0.3–1.2)
Total Protein: 5.5 g/dL — ABNORMAL LOW (ref 6.5–8.1)

## 2022-03-12 LAB — T4, FREE: Free T4: 1.55 ng/dL — ABNORMAL HIGH (ref 0.61–1.12)

## 2022-03-12 LAB — BRAIN NATRIURETIC PEPTIDE: B Natriuretic Peptide: 435.5 pg/mL — ABNORMAL HIGH (ref 0.0–100.0)

## 2022-03-12 LAB — TSH: TSH: 0.127 u[IU]/mL — ABNORMAL LOW (ref 0.350–4.500)

## 2022-03-12 MED ORDER — OXYCODONE HCL 5 MG PO TABS: 5.0000 mg | ORAL_TABLET | Freq: Four times a day (QID) | ORAL | 0 refills | Status: DC | PRN

## 2022-03-12 MED ORDER — MAGNESIUM SULFATE 4 GM/100ML IV SOLN
4.0000 g | Freq: Once | INTRAVENOUS | Status: AC
  Administered 2022-03-12: 4 g via INTRAVENOUS
  Filled 2022-03-12: qty 100

## 2022-03-12 MED ORDER — HYDRALAZINE HCL 25 MG PO TABS
25.0000 mg | ORAL_TABLET | Freq: Three times a day (TID) | ORAL | Status: DC
  Administered 2022-03-12 – 2022-03-13 (×3): 25 mg via ORAL
  Filled 2022-03-12 (×3): qty 1

## 2022-03-12 MED ORDER — METHIMAZOLE 10 MG PO TABS: 10.0000 mg | ORAL_TABLET | Freq: Two times a day (BID) | ORAL | Status: DC

## 2022-03-12 MED ORDER — DILTIAZEM HCL 25 MG/5ML IV SOLN: 10.0000 mg | Freq: Four times a day (QID) | INTRAVENOUS | Status: DC | PRN

## 2022-03-12 MED ORDER — OXYCODONE HCL 5 MG PO TABS
5.0000 mg | ORAL_TABLET | Freq: Four times a day (QID) | ORAL | 0 refills | Status: DC | PRN
  Filled 2022-03-12: qty 5, 2d supply, fill #0

## 2022-03-12 MED ORDER — METOPROLOL TARTRATE 50 MG PO TABS
50.0000 mg | ORAL_TABLET | Freq: Two times a day (BID) | ORAL | Status: DC
  Administered 2022-03-12: 50 mg via ORAL
  Filled 2022-03-12: qty 1

## 2022-03-12 NOTE — TOC Benefit Eligibility Note (Signed)
Patient Teacher, English as a foreign language completed.    The patient is currently admitted and upon discharge could be taking Eliquis 5 mg.  The current 30 day co-pay is $38.00.   The patient is insured through [place], [place] Patient Advocate Specialist [place] Patient Advocate Team Direct Number: [phone]  Fax: [phone]

## 2022-03-12 NOTE — Progress Notes (Signed)
Mobility Specialist Progress Note:   03/12/22 0929  Mobility  Activity Ambulated with assistance in hallway  Level of Assistance Modified independent, requires aide device or extra time  Assistive Device  (IV pole)  Distance Ambulated (ft) 550 ft  Activity Response Tolerated well  $Mobility charge 1 Mobility   Pt received up at sink brushing teeth. No complaints of pain. Left in chair with call bell in reach and all needs met.   Gareth Eagle Ellesse Antenucci Mobility Specialist Please contact via [employer] or  Rehab Office at [phone]

## 2022-03-12 NOTE — Progress Notes (Addendum)
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  Plan was to pursue surgery as an outpatient for cholecystostomy.On admission, she was found to be in A-fib with RVR.  Cardiology, general surgery consulted.  Cardiology considering cardioversion at some point.  General surgery planning for cholecystectomy tomorrow  Assessment & Plan:  Common Bile Duct Obstruction s/p Perc Chole Tube CT abdomen pelvis with small subcapsular collection adjacent to percutaneous biliary drain, likely post procedural hematoma or biloma, S/p cholangiogram by IR -> catheter well position and normally functioning, patent cystic duct.  Internal/external biliary drainage catheter was capped.  Seen by general surgery placed on Rocephin and Flagyl combination initially underwent lap cholecystectomy on 03/11/2022 tolerated the procedure well.  Still has right upper quadrant drain.  Defer any antibiotic continuation to general surgery.  Clinically much improved.   New onset atrial fibrillation with RVR Echo with EF 55-60%, no RWMA, now off of diltiazem drip, on oral beta-blocker.  Defer anticoagulation to cardiology team.     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  Also on oral sodium bicarbonate, outpatient follow-up with nephrology postdischarge once stable.   Hyperthyroidism  Suppressed TSH with elevated free T4, hold oral Synthroid supplementation for now, upon discharge resume at a lower dose PCP to monitor.   Hypertension Current medications  to be continued for now  Normocytic anemia  Hemoglobin currently stable in the range of 9-10   Obesity  Hx Roux en y gastric bypass Body mass index is 37.79 kg/m.            DVT prophylaxis:Place and maintain sequential compression device Start: 03/09/22 1725 SCDs Start: 03/08/22 1835     Code Status: Full Code  Family Communication: Discussed with daughter [name] on phone, she had called patient's phone on 03/11/2022, 03/12/2022  Patient status:Inpatient  Patient is from :Home  Anticipated discharge OM:[facility]  Estimated DC date:3-4 days   Consultants: cardiology, surgery    Subjective:  Patient in bed, appears comfortable, denies any headache, no fever, no chest pain or pressure, no shortness of breath , no abdominal pain. No new focal weakness.   Objective: Vitals:   03/11/22 1658 03/11/22 2236 03/12/22 0500 03/12/22 0814  BP: (!) 159/64 (!) 154/66  (!) 159/73  Pulse: 76 80  70  Resp: '20 20 15 16  '$ Temp: 97.8 F (36.6 C) 98.4 F (36.9 C)  (!) 97.4 F (36.3 C)  TempSrc: Oral Oral  Oral  SpO2: 94% 98%  99%  Weight:   108.4 kg   Height:        Intake/Output Summary (Last 24 hours) at 03/12/2022 1025 Last data filed at 03/11/2022 1800 Gross per 24 hour  Intake 1260 ml  Output 20 ml  Net 1240 ml   Filed Weights   03/11/22 0428 03/11/22 0829 03/12/22 0500  Weight: 106.7 kg 106.7 kg 108.4 kg    Examination:  Awake Alert, No new F.N deficits, Normal affect [place].AT,PERRAL Supple Neck, No JVD,   Symmetrical Chest wall movement, Good air movement bilaterally, CTAB iRRR,No Gallops, Rubs or new  Murmurs,  +ve B.Sounds, Abd Soft, No tenderness, RUQ quadrant drain. No Cyanosis, Clubbing or edema     Data Reviewed: I have personally reviewed following labs and imaging studies  Recent Labs  Lab 03/08/22 1404 03/09/22 0826 03/10/22 0117 03/11/22 0056 03/12/22 0106  WBC 15.9* 11.2* 9.8 9.2 16.7*  HGB 11.0* 11.2* 9.8* 9.0* 8.3*  HCT 35.1* 34.2* 30.3*  27.8* 26.6*  PLT 416* 334 296 266 250  MCV 87.1 85.9 87.6 86.6 90.2  MCH 27.3 28.1 28.3 28.0 28.1  MCHC 31.3 32.7 32.3 32.4 31.2  RDW 13.8 13.8 13.8 13.8 13.7  LYMPHSABS 1.4 1.0 1.3  --  0.6*  MONOABS 1.6* 1.0 1.1*  --  0.6  EOSABS 0.1 0.1 0.1  --  0.0  BASOSABS 0.1 0.0 0.1  --  0.0    Recent Labs  Lab 03/08/22 1404 03/08/22 1433 03/09/22 0826 03/10/22 0117 03/11/22 0056 03/11/22 0650 03/12/22 0106  NA 133*  --  139 137 139  --  138  K 4.5  --  4.1 3.8 4.3  --  4.7  CL 100  --  109 110 112*  --  108  CO2 18*  --  15* 16* 20*  --  20*  GLUCOSE 121*  --  97 110* 104*  --  155*  BUN 49*  --  41* 36* 30*  --  26*  CREATININE 2.71*  --  2.27* 2.03* 1.93*  --  1.76*  AST 16  --  21 21  --   --  23  ALT 33  --  35 28  --   --  26  ALKPHOS 271*  --  277* 225*  --   --  143*  BILITOT 0.9  --  0.8 0.7  --   --  0.6  ALBUMIN 4.1  --  3.1* 2.7*  --   --  2.4*  LATICACIDVEN  --  0.8  --   --   --   --   --   INR  --   --   --   --   --  1.3*  --   TSH  --  0.241*  --   --   --   --  0.127*  BNP 180.3*  --   --   --   --  660.0* 435.5*  MG 1.6*  --  2.1 1.8  --   --  1.4*  CALCIUM 9.4  --  9.0 8.4* 8.1*  --  8.2*    Radiology Studies: DG Chest Port 1 View  Result Date: 03/11/2022 CLINICAL DATA:  [id number] with shortness of breath and atrial fibrillation. EXAM: PORTABLE CHEST 1 VIEW COMPARISON:  Chest CT without contrast 03/08/2022 FINDINGS: 6:57 a.m. The heart is enlarged. Central vascular prominence is again noted. There is no overt edema. Moderately elevated right hemidiaphragm again is seen and an overlying small or moderate sized right pleural effusion. Right lower lung field is largely obscured, previously demonstrating atelectasis or consolidation in the lower lobe alongside the effusion. The remaining lungs are clear. The overall aeration seems unchanged. Aortic tortuosity and calcification are again noted.  Osteopenia. IMPRESSION: 1. Cardiomegaly with central vascular prominence.  No overt edema. 2. Small to moderate sized right pleural effusion. 3. Right lower lung field is obscured, on CT demonstrating atelectasis or consolidation in the lower lobe alongside the effusion. No new abnormality is suspected. Electronically Signed   By: Telford Nab M.D.   On: 03/11/2022 07:10  Scheduled Meds:  hydrALAZINE  25 mg Oral Q8H   metoprolol tartrate  50 mg Oral BID   mupirocin ointment  1 Application Nasal BID   sodium bicarbonate  650 mg Oral TID   sodium chloride flush  3 mL Intravenous Q12H   Continuous Infusions:     LOS: 4 days   Lala Lund, MD [facility] P11/20/2023, 10:25 AM

## 2022-03-12 NOTE — Evaluation (Signed)
Occupational Therapy Evaluation Patient Details Name: Lynn Joseph MRN: [id number] DOB: 1945/11/17 Today's Date: 03/12/2022   History of Present Illness Pt is a 76 y/o female presenting on 03/08/22 with A-fib with RVR. Of note, pt recently seen in [place] for CBD obstruction and had perc chole drain placed on 11/8. Plan was to pursue surgery as an outpatient for cholecystostomy. S/p laparoscopic lysis of adhesions and cholecystectomy 11/19. 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 Admitted for above and presents near baseline at modified independent to independent level for ADLs and mobility.  She demonstrates ability to complete LB dressing from sitting with mild discomfort when bending forward, but plans to wear slip no shoes; simulated shower transfers and LB bathing with independence.  Discussed energy conservation and progression of return to daily routines. Patient with no further questions or concerns. OT will sign off.      Recommendations for follow up therapy are one component of a multi-disciplinary discharge planning process, led by the attending physician.  Recommendations may be updated based on patient status, additional functional criteria and insurance authorization.   Follow Up Recommendations  No OT follow up     Assistance Recommended at Discharge PRN  Patient can return home with the following Assistance with cooking/housework    Functional Status Assessment  Patient has not had a recent decline in their functional status  Equipment Recommendations  None recommended by OT    Recommendations for Other Services       Precautions / Restrictions Precautions Precautions: Fall Restrictions Weight Bearing Restrictions: No      Mobility Bed Mobility               General bed mobility comments: OOB in recliner upon entry     Transfers Overall transfer level: Independent Equipment used: None                      Balance Overall balance assessment: Mild deficits observed, not formally tested                                         ADL either performed or assessed with clinical judgement   ADL Overall ADL's : Modified independent                                             Vision Baseline Vision/History: 1 Wears glasses Ability to See in Adequate Light: 0 Adequate Patient Visual Report: No change from baseline Vision Assessment?: No apparent visual deficits     Perception     Praxis      Pertinent Vitals/Pain Pain Assessment Pain Assessment: No/denies pain     Hand Dominance     Extremity/Trunk Assessment Upper Extremity Assessment Upper Extremity Assessment: Overall WFL for tasks assessed   Lower Extremity Assessment Lower Extremity Assessment: Defer to PT evaluation RLE Deficits / Details: slight numbness noted in toes, but reports this is baseline due to "sciatica"; MMT scores of 4+ to 5 grossly RLE Sensation: decreased light touch LLE Deficits / Details: slight numbness noted in foot and toes, worse on L than on R, but reports this is baseline due to "sciatica"; MMT  scores of 4+ to 5 grossly LLE Sensation: decreased light touch   Cervical / Trunk Assessment Cervical / Trunk Assessment: Normal   Communication Communication Communication: No difficulties   Cognition Arousal/Alertness: Awake/alert Behavior During Therapy: WFL for tasks assessed/performed Overall Cognitive Status: Within Functional Limits for tasks assessed                                       General Comments  family at side and supportive; reviewed energy conservation techniques for ADLs and IADLs    Exercises     Shoulder Instructions      Home Living Family/patient expects to be discharged to:: Private residence Living Arrangements:  Alone;Other (Comment) (grandson stays with her half the week) Available Help at Discharge: Family;Available PRN/intermittently Type of Home: House Home Access: Level entry     Home Layout: One level     Bathroom Shower/Tub: Occupational psychologist: Handicapped height Bathroom Accessibility: Yes   Home Equipment: Other (comment) (old RW)          Prior Functioning/Environment Prior Level of Function : Independent/Modified Independent;Driving             Mobility Comments: Does not use AD. Reports she trips on things, but has not had a recent fall. ADLs Comments: Performs all ADLs and iADLs without assistance        OT Problem List: Decreased activity tolerance      OT Treatment/Interventions:      OT Goals(Current goals can be found in the care plan section) Acute Rehab OT Goals Patient Stated Goal: home OT Goal Formulation: With patient  OT Frequency:      Co-evaluation              AM-PAC OT "6 Clicks" Daily Activity     Outcome Measure Help from another person eating meals?: None Help from another person taking care of personal grooming?: None Help from another person toileting, which includes using toliet, bedpan, or urinal?: None Help from another person bathing (including washing, rinsing, drying)?: None Help from another person to put on and taking off regular upper body clothing?: None Help from another person to put on and taking off regular lower body clothing?: None 6 Click Score: 24   End of Session Nurse Communication: Mobility status  Activity Tolerance: Patient tolerated treatment well Patient left: in chair;with call bell/phone within reach;with family/visitor present  OT Visit Diagnosis: Other abnormalities of gait and mobility (R26.89);Pain                Time: 5631-4970 OT Time Calculation (min): 16 min Charges:  OT General Charges $OT Visit: 1 Visit OT Evaluation $OT Eval Low Complexity: Graham, OT Acute  Rehabilitation Services Office [phone]   Delight Stare 03/12/2022, 1:02 PM

## 2022-03-12 NOTE — Care Management Important Message (Signed)
Important Message  Patient Details  Name: Lynn Joseph MRN: [id number] Date of Birth: June 01, 1945   Medicare Important Message Given:  Yes     Shelda Altes 03/12/2022, 10:24 AM

## 2022-03-12 NOTE — Progress Notes (Addendum)
Rounding Note    Patient Name: Lynn Joseph Date of Encounter: 03/12/2022  [facility] Cardiologist: Early Osmond, MD   Subjective   Feeling much better this morning.   Inpatient Medications    Scheduled Meds:  metoprolol tartrate  75 mg Oral TID   mupirocin ointment  1 Application Nasal BID   sodium bicarbonate  650 mg Oral TID   sodium chloride flush  3 mL Intravenous Q12H   Continuous Infusions:  diltiazem (CARDIZEM) infusion Stopped (03/11/22 1046)   PRN Meds: acetaminophen **OR** acetaminophen, gabapentin, metoprolol tartrate, ondansetron **OR** ondansetron (ZOFRAN) IV, oxyCODONE   Vital Signs    Vitals:   03/11/22 1658 03/11/22 2236 03/12/22 0500 03/12/22 0814  BP: (!) 159/64 (!) 154/66  (!) 159/73  Pulse: 76 80  70  Resp: '20 20 15 16  '$ Temp: 97.8 F (36.6 C) 98.4 F (36.9 C)  (!) 97.4 F (36.3 C)  TempSrc: Oral Oral  Oral  SpO2: 94% 98%  99%  Weight:   108.4 kg   Height:        Intake/Output Summary (Last 24 hours) at 03/12/2022 0959 Last data filed at 03/11/2022 1800 Gross per 24 hour  Intake 1260 ml  Output 20 ml  Net 1240 ml      03/12/2022    5:00 AM 03/11/2022    8:29 AM 03/11/2022    4:28 AM  Last 3 Weights  Weight (lbs) 238 lb 14.4 oz 235 lb 3.7 oz 235 lb 3.7 oz  Weight (kg) 108.364 kg 106.7 kg 106.7 kg      Telemetry    Sinus Rhythm - Personally Reviewed  ECG    No new tracing this morning  Physical Exam   GEN: No acute distress.   Neck: No JVD Cardiac: RRR, no murmurs, rubs, or gallops.  Respiratory: Clear to auscultation bilaterally. GI: Soft, nontender, non-distended  MS: No edema; No deformity. Neuro:  Nonfocal  Psych: Normal affect   Labs    High Sensitivity Troponin:  No results for input(s): "TROPONINIHS" in the last 720 hours.   Chemistry Recent Labs  Lab 03/09/22 0826 03/10/22 0117 03/11/22 0056 03/12/22 0106  NA 139 137 139 138  K 4.1 3.8 4.3 4.7  CL 109 110 112* 108  CO2 15* 16*  20* 20*  GLUCOSE 97 110* 104* 155*  BUN 41* 36* 30* 26*  CREATININE 2.27* 2.03* 1.93* 1.76*  CALCIUM 9.0 8.4* 8.1* 8.2*  MG 2.1 1.8  --  1.4*  PROT 7.0 6.1*  --  5.5*  ALBUMIN 3.1* 2.7*  --  2.4*  AST 21 21  --  23  ALT 35 28  --  26  ALKPHOS 277* 225*  --  143*  BILITOT 0.8 0.7  --  0.6  GFRNONAA 22* 25* 27* 30*  ANIONGAP '15 11 7 10    '$ Lipids No results for input(s): "CHOL", "TRIG", "HDL", "LABVLDL", "LDLCALC", "CHOLHDL" in the last 168 hours.  Hematology Recent Labs  Lab 03/10/22 0117 03/11/22 0056 03/12/22 0106  WBC 9.8 9.2 16.7*  RBC 3.46* 3.21* 2.95*  HGB 9.8* 9.0* 8.3*  HCT 30.3* 27.8* 26.6*  MCV 87.6 86.6 90.2  MCH 28.3 28.0 28.1  MCHC 32.3 32.4 31.2  RDW 13.8 13.8 13.7  PLT 296 266 250   Thyroid  Recent Labs  Lab 03/12/22 0106  TSH 0.127*  FREET4 1.55*    BNP Recent Labs  Lab 03/08/22 1404 03/11/22 0650 03/12/22 0106  BNP 180.3* 660.0*  435.5*    DDimer No results for input(s): "DDIMER" in the last 168 hours.   Radiology    DG Chest Port 1 View  Result Date: 03/11/2022 CLINICAL DATA:  [id number] with shortness of breath and atrial fibrillation. EXAM: PORTABLE CHEST 1 VIEW COMPARISON:  Chest CT without contrast 03/08/2022 FINDINGS: 6:57 a.m. The heart is enlarged. Central vascular prominence is again noted. There is no overt edema. Moderately elevated right hemidiaphragm again is seen and an overlying small or moderate sized right pleural effusion. Right lower lung field is largely obscured, previously demonstrating atelectasis or consolidation in the lower lobe alongside the effusion. The remaining lungs are clear. The overall aeration seems unchanged. Aortic tortuosity and calcification are again noted.  Osteopenia. IMPRESSION: 1. Cardiomegaly with central vascular prominence. No overt edema. 2. Small to moderate sized right pleural effusion. 3. Right lower lung field is obscured, on CT demonstrating atelectasis or consolidation in the lower lobe alongside  the effusion. No new abnormality is suspected. Electronically Signed   By: Telford Nab M.D.   On: 03/11/2022 07:10    Cardiac Studies   Echo: 03/09/22  IMPRESSIONS     1. Left ventricular ejection fraction, by estimation, is 55 to 60%. The  left ventricle has normal function. The left ventricle has no regional  wall motion abnormalities. Left ventricular diastolic function could not  be evaluated.   2. Right ventricular systolic function is normal. The right ventricular  size is normal. There is normal pulmonary artery systolic pressure. The  estimated right ventricular systolic pressure is 74.2 mmHg.   3. The mitral valve is grossly normal. Trivial mitral valve  regurgitation. No evidence of mitral stenosis.   4. The aortic valve is tricuspid. There is mild calcification of the  aortic valve. Aortic valve regurgitation is not visualized. Aortic valve  sclerosis is present, with no evidence of aortic valve stenosis.   5. The inferior vena cava is normal in size with greater than 50%  respiratory variability, suggesting right atrial pressure of 3 mmHg.   Comparison(s): Changes from prior study are noted. EF unchanged. Patient  now in Afib.   FINDINGS   Left Ventricle: Left ventricular ejection fraction, by estimation, is 55  to 60%. The left ventricle has normal function. The left ventricle has no  regional wall motion abnormalities. The left ventricular internal cavity  size was normal in size. There is   no left ventricular hypertrophy. Left ventricular diastolic function  could not be evaluated due to atrial fibrillation. Left ventricular  diastolic function could not be evaluated.   Right Ventricle: The right ventricular size is normal. No increase in  right ventricular wall thickness. Right ventricular systolic function is  normal. There is normal pulmonary artery systolic pressure. The tricuspid  regurgitant velocity is 1.79 m/s, and   with an assumed right atrial  pressure of 3 mmHg, the estimated right  ventricular systolic pressure is 59.5 mmHg.   Left Atrium: Left atrial size was normal in size.   Right Atrium: Right atrial size was normal in size.   Pericardium: There is no evidence of pericardial effusion. Presence of  epicardial fat layer.   Mitral Valve: The mitral valve is grossly normal. Trivial mitral valve  regurgitation. No evidence of mitral valve stenosis.   Tricuspid Valve: The tricuspid valve is grossly normal. Tricuspid valve  regurgitation is trivial. No evidence of tricuspid stenosis.   Aortic Valve: The aortic valve is tricuspid. There is mild calcification  of the aortic  valve. Aortic valve regurgitation is not visualized. Aortic  valve sclerosis is present, with no evidence of aortic valve stenosis.  Aortic valve mean gradient measures  4.0 mmHg. Aortic valve peak gradient measures 6.4 mmHg. Aortic valve area,  by VTI measures 2.59 cm.   Pulmonic Valve: The pulmonic valve was grossly normal. Pulmonic valve  regurgitation is trivial. No evidence of pulmonic stenosis.   Aorta: The aortic root and ascending aorta are structurally normal, with  no evidence of dilitation.   Venous: The inferior vena cava is normal in size with greater than 50%  respiratory variability, suggesting right atrial pressure of 3 mmHg.   IAS/Shunts: The atrial septum is grossly normal.    Patient Profile     76 y.o. female with HTN, HLD, thyroid disorder, CRI, and obesity who is being seen 03/08/2022 for the evaluation of new-dx AF at the request of Dr. Lorin Mercy.    Pt recently referred to cardiology for CP and palpitations. Renal functio precluded use of CCTA. Nuclear stress test was not ischemic. Heart monitor did not show Afib/flutter. Review of prior echos does not show CHF.   Assessment & Plan    New onset Paroxsymal Afib with RVR -- presented to [facility] with Afib RVR in the setting of sepsis 2/2 CBD obstruction with perc tube -- placed  on IV cardizem along with metoprolol for rate control with improvement in order to undergo surgery -- currently in SR -- has been cleared to start [place] 24hrs post op if stable Hgb. Hgb 8.2 today therefore would plan for tomorrow pending stable Hgb and ok from general surgery  CBD obstruction s/p cholecystectomy Cholelithiasis -- completed 11/19 with Dr. Zenia Resides -- management per surgery  AKI with CKD III -- Cr peaked at 2.71, now improving to baseline 1.7 with hydration  -- follow BMET  Hyperthyroidism -- TSH 0.127, T3 42, T4 1.55  HTN -- blood pressures elevated  -- currently on metoprolol '50mg'$  BID, add hydralazine '25mg'$  TID (PTA meds)  For questions or updates, please contact [place] Please consult [email or web address] for contact info under        Signed, Reino Bellis, NP  03/12/2022, 9:59 AM

## 2022-03-12 NOTE — TOC Transition Note (Signed)
Transition of Care (TOC) - CM/SW Discharge Note [name] RN, BSN Transitions of Care Unit 4E- RN Case Manager See Treatment Team for direct phone #   Patient Details  Name: Lynn Joseph MRN: [id number] Date of Birth: 1946/01/21  Transition [facility]) CM/SW Contact:  [name], RN Phone Number: 03/12/2022, 4:11 PM   Clinical Narrative:    Pt seen at bedside for transition needs Noted Eliquis to start on discharge - Benefits check requested and copay noted as $38- info relayed to pt-  Per pt she prefers to use [facility] to fill scripts- location confirmed as [facility] store [id number]  Pt has been provided the Eliquis 30 day coupon to use on discharge to fill first month at no cost.   Discussed with pt recommendations for DME-RW and outpt PT- pt voiced she is agreeable to both- pt states she prefers outpt location in [place] at [facility]- MD provided verbal order for referral to [facility] outpt PT. Referral sent via epic.   Pt does not have preference for DME- and agreeable to in house provider- referral sent to [employer] via parachute system to have delivered to room once processed with insurance.   No further TOC needs noted. RNCM will sign off for now as intervention is no longer needed. Please re-consult  if new needs arise, or contact RNCM assigned to treatment team for further questions/concerns.    Final next level of care: OP Rehab Barriers to Discharge: No Barriers Identified   Patient Goals and CMS Choice Patient states their goals for this hospitalization and ongoing recovery are:: return home CMS Medicare.gov Compare Post Acute Care list provided to:: Patient Choice offered to / list presented to : Patient  Discharge Placement                 Home      Discharge Plan and Services   Discharge Planning Services: CM Consult Post Acute Care Choice: Durable Medical Equipment          DME Arranged: Walker rolling DME Agency: [employer] Date DME  Agency Contacted: 03/12/22 Time DME Agency Contacted: [phone] Representative spoke with at DME Agency: [place] system HH Arranged: NA [place] Agency: NA        Social Determinants of Health ([place]) Interventions     Readmission Risk Interventions     No data to display

## 2022-03-12 NOTE — Evaluation (Signed)
Physical Therapy Evaluation Patient Details Name: Lynn Joseph MRN: [id number] DOB: 01-28-1946 Today's Date: 03/12/2022  History of Present Illness  Pt is a 76 y/o female presenting on 03/08/22 with A-fib with RVR. Of note, pt recently seen in [place] for CBD obstruction and had perc chole drain placed on 11/8. Plan was to pursue surgery as an outpatient for cholecystostomy. S/p laparoscopic lysis of adhesions and cholecystectomy 11/19. 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 PTA, she was independent without DME, living alone (grandson stays with her ~1/2 the week to reduce commute to his job) in a 1-level house with a level entry. Pt reports some mild imbalance at baseline. She is currently demonstrating deficits in endurance noted by DOE, but VSS on RA throughout, along with deficits in balance as she would drift L/R when ambulating intermittently. Her slow gait pace indicates she is at risk for falls as well. She is able to mobilize without needing assistance at this time though. She would benefit from follow-up with OPPT to address her deficits in endurance and balance though. Recommending a RW at d/c as her current one is old and breaking down and she reports she needs a RW when her back pain is bad. Will continue to follow acutely.     Recommendations for follow up therapy are one component of a multi-disciplinary discharge planning process, led by the attending physician.  Recommendations may be updated based on patient status, additional functional criteria and insurance authorization.  Follow Up Recommendations Outpatient PT      Assistance Recommended at Discharge PRN  Patient can return home with the following       Equipment Recommendations Rolling walker (2 wheels)  Recommendations for Other Services        Functional Status Assessment Patient has had a recent decline in their functional status and demonstrates the ability to make significant improvements in function in a reasonable and predictable amount of time.     Precautions / Restrictions Precautions Precautions: Fall Restrictions Weight Bearing Restrictions: No      Mobility  Bed Mobility               General bed mobility comments: Pt up in chair upon arrival.    Transfers Overall transfer level: Independent Equipment used: None               General transfer comment: Able to come to stand without LOB or need for assistance.    Ambulation/Gait Ambulation/Gait assistance: Min guard, Supervision Gait Distance (Feet): 380 Feet Assistive device: None Gait Pattern/deviations: Step-through pattern, Decreased stride length, Drifts right/left, Narrow base of support Gait velocity: reduced Gait velocity interpretation: <1.8 ft/sec, indicate of risk for recurrent falls   General Gait Details: Pt with semi-slow and guarded gait, displaying intermittent bouts of lateral swaying, especially when distracted or turning quickly. Able to maintain her balance though without UE support or assistance. Intermittently reaches out for walls  Stairs            Wheelchair Mobility    Modified Rankin (Stroke Patients Only)       Balance Overall balance assessment: Mild deficits observed, not formally tested  Pertinent Vitals/Pain Pain Assessment Pain Assessment: No/denies pain    Home Living Family/patient expects to be discharged to:: Private residence Living Arrangements: Alone;Other (Comment) (grandson stays with her half the week) Available Help at Discharge: Family;Available PRN/intermittently Type of Home: House Home Access: Level entry       Home Layout: One level Home Equipment: Other (comment) (old RW)      Prior Function Prior Level of  Function : Independent/Modified Independent;Driving             Mobility Comments: Does not use AD. Reports she trips on things, but has not had a recent fall. ADLs Comments: Performs all ADLs and iADLs without assistance     Hand Dominance        Extremity/Trunk Assessment   Upper Extremity Assessment Upper Extremity Assessment: Defer to OT evaluation    Lower Extremity Assessment Lower Extremity Assessment: RLE deficits/detail;LLE deficits/detail RLE Deficits / Details: slight numbness noted in toes, but reports this is baseline due to "sciatica"; MMT scores of 4+ to 5 grossly RLE Sensation: decreased light touch LLE Deficits / Details: slight numbness noted in foot and toes, worse on L than on R, but reports this is baseline due to "sciatica"; MMT scores of 4+ to 5 grossly LLE Sensation: decreased light touch    Cervical / Trunk Assessment Cervical / Trunk Assessment: Normal  Communication   Communication: No difficulties  Cognition Arousal/Alertness: Awake/alert Behavior During Therapy: WFL for tasks assessed/performed Overall Cognitive Status: Within Functional Limits for tasks assessed                                          General Comments General comments (skin integrity, edema, etc.): DOE noted but VSS on RA; encouraged pt and her daughter on progressing a walking program    Exercises     Assessment/Plan    PT Assessment Patient needs continued PT services  PT Problem List Decreased activity tolerance;Decreased balance;Decreased mobility;Cardiopulmonary status limiting activity       PT Treatment Interventions DME instruction;Gait training;Functional mobility training;Therapeutic activities;Therapeutic exercise;Balance training;Neuromuscular re-education;Patient/family education    PT Goals (Current goals can be found in the Care Plan section)  Acute Rehab PT Goals Patient Stated Goal: to go home PT Goal Formulation: With  patient/family Time For Goal Achievement: 03/26/22 Potential to Achieve Goals: Good    Frequency Min 3X/week     Co-evaluation               AM-PAC PT "6 Clicks" Mobility  Outcome Measure Help needed turning from your back to your side while in a flat bed without using bedrails?: None Help needed moving from lying on your back to sitting on the side of a flat bed without using bedrails?: None Help needed moving to and from a bed to a chair (including a wheelchair)?: None Help needed standing up from a chair using your arms (e.g., wheelchair or bedside chair)?: None Help needed to walk in hospital room?: A Little Help needed climbing 3-5 steps with a railing? : A Little 6 Click Score: 22    End of Session   Activity Tolerance: Patient tolerated treatment well Patient left: in chair;with call bell/phone within reach;with family/visitor present   PT Visit Diagnosis: Unsteadiness on feet (R26.81);Other abnormalities of gait and mobility (R26.89)    Time: 6568-1275 PT Time Calculation (min) (ACUTE ONLY): 12 min   Charges:  PT Evaluation $PT Eval Low Complexity: 1 Low          Moishe Spice, PT, DPT Acute Rehabilitation Services  Office: [phone]   Orvan Falconer 03/12/2022, 11:33 AM

## 2022-03-12 NOTE — Progress Notes (Signed)
1 Day Post-Op  Subjective: Feeling well this am. No significant abdominal pain since right after surgery. Tolerating solid food. Passing flatus and loose Bms. ambulating  Daughter bedside  Objective: Vital signs in last 24 hours: Temp:  [97.4 F (36.3 C)-98.4 F (36.9 C)] 97.4 F (36.3 C) (11/20 0814) Pulse Rate:  [66-80] 70 (11/20 0814) Resp:  [10-20] 16 (11/20 0814) BP: (136-159)/(55-107) 159/73 (11/20 0814) SpO2:  [91 %-99 %] 99 % (11/20 0814) Weight:  [108.4 kg] 108.4 kg (11/20 0500) Last BM Date : 03/10/22  Intake/Output from previous day: 11/19 0701 - 11/20 0700 In: 1493.3 [P.O.:360; I.V.:1041.7; IV Piggyback:91.6] Out: 321 [Urine:300; Stool:1; Blood:20] Intake/Output this shift: No intake/output data recorded.  PE: General: resting comfortably, NAD Neuro: alert and oriented, no focal deficits Resp: normal work of breathing Abdomen: soft, nondistended, nontender to palpation. PTC in place RUQ, capped. Incisions c/d/I with surgical glue Extremities: warm and well-perfused   Lab Results:  Recent Labs    03/11/22 0056 03/12/22 0106  WBC 9.2 16.7*  HGB 9.0* 8.3*  HCT 27.8* 26.6*  PLT 266 250    BMET Recent Labs    03/11/22 0056 03/12/22 0106  NA 139 138  K 4.3 4.7  CL 112* 108  CO2 20* 20*  GLUCOSE 104* 155*  BUN 30* 26*  CREATININE 1.93* 1.76*  CALCIUM 8.1* 8.2*    PT/INR Recent Labs    03/11/22 0650  LABPROT 15.7*  INR 1.3*    CMP     Component Value Date/Time   NA 138 03/12/2022 0106   K 4.7 03/12/2022 0106   CL 108 03/12/2022 0106   CO2 20 (L) 03/12/2022 0106   GLUCOSE 155 (H) 03/12/2022 0106   BUN 26 (H) 03/12/2022 0106   CREATININE 1.76 (H) 03/12/2022 0106   CALCIUM 8.2 (L) 03/12/2022 0106   PROT 5.5 (L) 03/12/2022 0106   ALBUMIN 2.4 (L) 03/12/2022 0106   AST 23 03/12/2022 0106   ALT 26 03/12/2022 0106   ALKPHOS 143 (H) 03/12/2022 0106   BILITOT 0.6 03/12/2022 0106   GFRNONAA 30 (L) 03/12/2022 0106   GFRAA 35 (L)  10/30/2018 0617   Lipase     Component Value Date/Time   LIPASE 48 10/28/2018 1234       Studies/Results: DG Chest Port 1 View  Result Date: 03/11/2022 CLINICAL DATA:  [id number] with shortness of breath and atrial fibrillation. EXAM: PORTABLE CHEST 1 VIEW COMPARISON:  Chest CT without contrast 03/08/2022 FINDINGS: 6:57 a.m. The heart is enlarged. Central vascular prominence is again noted. There is no overt edema. Moderately elevated right hemidiaphragm again is seen and an overlying small or moderate sized right pleural effusion. Right lower lung field is largely obscured, previously demonstrating atelectasis or consolidation in the lower lobe alongside the effusion. The remaining lungs are clear. The overall aeration seems unchanged. Aortic tortuosity and calcification are again noted.  Osteopenia. IMPRESSION: 1. Cardiomegaly with central vascular prominence. No overt edema. 2. Small to moderate sized right pleural effusion. 3. Right lower lung field is obscured, on CT demonstrating atelectasis or consolidation in the lower lobe alongside the effusion. No new abnormality is suspected. Electronically Signed   By: Telford Nab M.D.   On: 03/11/2022 07:10    Anti-infectives: Anti-infectives (From admission, onward)    Start     Dose/Rate Route Frequency Ordered Stop   03/10/22 1400  cefTRIAXone (ROCEPHIN) 2 g in sodium chloride 0.9 % 100 mL IVPB  Status:  Discontinued  2 g 200 mL/hr over 30 Minutes Intravenous Every 24 hours 03/10/22 1310 03/11/22 1157   03/10/22 1400  metroNIDAZOLE (FLAGYL) IVPB 500 mg  Status:  Discontinued        500 mg 100 mL/hr over 60 Minutes Intravenous Every 8 hours 03/10/22 1310 03/11/22 1157   03/09/22 1600  ceFEPIme (MAXIPIME) 2 g in sodium chloride 0.9 % 100 mL IVPB  Status:  Discontinued        2 g 200 mL/hr over 30 Minutes Intravenous Every 24 hours 03/08/22 1514 03/10/22 1310   03/09/22 0300  metroNIDAZOLE (FLAGYL) IVPB 500 mg        500 mg 100  mL/hr over 60 Minutes Intravenous  Once 03/08/22 1835 03/09/22 0410   03/08/22 1515  ceFEPIme (MAXIPIME) 2 g in sodium chloride 0.9 % 100 mL IVPB        2 g 200 mL/hr over 30 Minutes Intravenous  Once 03/08/22 1507 03/08/22 1635   03/08/22 1515  metroNIDAZOLE (FLAGYL) IVPB 500 mg        500 mg 100 mL/hr over 60 Minutes Intravenous  Once 03/08/22 1507 03/08/22 1750        Assessment/Plan 76 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 She underwent PTC and balloon sweep of the CBD via percutaneous access in [place], and was discharged with a percutaneous biliary drain. She is now admitted with AKI and new-onset a-fib, presumably secondary to dehydration from PTC losses. Cholangiogram 11/17 showed patency of the biliary tree and cystic duct, with visible filling defects in the common bile duct.   POD1 S/p Laparoscopic lysis of adhesions, laparoscopic cholecystectomy by Dr. Zenia Resides on 11/19 - no findings of acute cholecystitis and no need for further abx - hgb 8.2 - can resume anticoagulation 24 hours post op from our standpoint but defer to cardiology reccs for repeat hgb tomorrow am - follow up with IR for PTC drain - will send pain meds and arrange surgical follow up  FEN: [place] ID: [email or web address] periop VTE: held   LOS: 4 days   Winferd Humphrey, [facility] 03/12/2022, 11:48 AM Please see Amion for pager number during day hours 7:00am-4:30pm

## 2022-03-12 NOTE — Discharge Instructions (Signed)
Follow with Primary MD Velna Hatchet, MD in 7 days   Get CBC, CMP, TSH, magnesium, 2 view Chest X ray -  checked next visit with your primary MD   Activity: As tolerated with Full fall precautions use walker/cane & assistance as needed  Disposition Home    Diet: Heart Healthy    Special Instructions: If you have smoked or chewed Tobacco  in the last 2 yrs please stop smoking, stop any regular Alcohol  and or any Recreational drug use.  On your next visit with your primary care physician please Get Medicines reviewed and adjusted.  Please request your Prim.MD to go over all Hospital Tests and Procedure/Radiological results at the follow up, please get all Hospital records sent to your Prim MD by signing hospital release before you go home.  If you experience worsening of your admission symptoms, develop shortness of breath, life threatening emergency, suicidal or homicidal thoughts you must seek medical attention immediately by calling 911 or calling your MD immediately  if symptoms less severe.  You Must read complete instructions/literature along with all the possible adverse reactions/side effects for all the Medicines you take and that have been prescribed to you. Take any new Medicines after you have completely understood and accpet all the possible adverse reactions/side effects.       [facility]  Please arrive at least 30 min before your appointment to complete your check in paperwork.  If you are unable to arrive 30 min prior to your appointment time we may have to cancel or reschedule you. LAPAROSCOPIC SURGERY: POST OP INSTRUCTIONS Always review your discharge instruction sheet given to you by the facility where your surgery was performed. IF YOU HAVE DISABILITY OR FAMILY LEAVE FORMS, YOU MUST BRING THEM TO THE OFFICE FOR PROCESSING.   DO NOT GIVE THEM TO YOUR DOCTOR.  PAIN CONTROL  First take acetaminophen (Tylenol) AND/or ibuprofen (Advil) to  control your pain after surgery.  Follow directions on package.  Taking acetaminophen (Tylenol) and/or ibuprofen (Advil) regularly after surgery will help to control your pain and lower the amount of prescription pain medication you may need.  You should not take more than 4,000 mg (4 grams) of acetaminophen (Tylenol) in 24 hours.  You should not take ibuprofen (Advil), aleve, motrin, naprosyn or other NSAIDS if you have a history of stomach ulcers or chronic kidney disease.  A prescription for pain medication may be given to you upon discharge.  Take your pain medication as prescribed, if you still have uncontrolled pain after taking acetaminophen (Tylenol) or ibuprofen (Advil). Use ice packs to help control pain. If you need a refill on your pain medication, please contact your pharmacy.  They will contact our office to request authorization. Prescriptions will not be filled after 5pm or on week-ends.  HOME MEDICATIONS Take your usually prescribed medications unless otherwise directed.  DIET You should follow a light diet the first few days after arrival home.  Be sure to include lots of fluids daily. Avoid fatty, fried foods.   CONSTIPATION It is common to experience some constipation after surgery and if you are taking pain medication.  Increasing fluid intake and taking a stool softener (such as Colace) will usually help or prevent this problem from occurring.  A mild laxative (Milk of Magnesia or Miralax) should be taken according to package instructions if there are no bowel movements after 48 hours.  WOUND/INCISION CARE Most patients will experience some swelling and bruising in the  area of the incisions.  Ice packs will help.  Swelling and bruising can take several days to resolve.  Unless discharge instructions indicate otherwise, follow guidelines below  STERI-STRIPS - you may remove your outer bandages 48 hours after surgery, and you may shower at that time.  You have steri-strips (small  skin tapes) in place directly over the incision.  These strips should be left on the skin for 7-10 days.   DERMABOND/SKIN GLUE - you may shower in 24 hours.  The glue will flake off over the next 2-3 weeks. Any sutures or staples will be removed at the office during your follow-up visit.  ACTIVITIES You may resume regular (light) daily activities beginning the next day--such as daily self-care, walking, climbing stairs--gradually increasing activities as tolerated.  You may have sexual intercourse when it is comfortable.  Refrain from any heavy lifting or straining until approved by your doctor. You may drive when you are no longer taking prescription pain medication, you can comfortably wear a seatbelt, and you can safely maneuver your car and apply brakes.  FOLLOW-UP You should see your doctor in the office for a follow-up appointment approximately 2-3 weeks after your surgery.  You should have been given your post-op/follow-up appointment when your surgery was scheduled.  If you did not receive a post-op/follow-up appointment, make sure that you call for this appointment within a day or two after you arrive home to insure a convenient appointment time.   WHEN TO CALL YOUR DOCTOR: Fever over 101.0 Inability to urinate Continued bleeding from incision. Increased pain, redness, or drainage from the incision. Increasing abdominal pain  The clinic staff is available to answer your questions during regular business hours.  Please don't hesitate to call and ask to speak to one of the nurses for clinical concerns.  If you have a medical emergency, go to the nearest emergency room or call 911.  A surgeon from [facility] is always on call at the hospital. [address] [place], [place], [place]  [address] ? [address] [place], [place], [place]   [address] [phone] ? [phone] ? FAX [phone]    Information on my medicine - ELIQUIS (apixaban)  Why was Eliquis  prescribed for you? Eliquis was prescribed for you to reduce the risk of a blood clot forming that can cause a stroke if you have a medical condition called atrial fibrillation (a type of irregular heartbeat).  What do You need to know about Eliquis ? Take your Eliquis TWICE DAILY - one tablet in the morning and one tablet in the evening with or without food. If you have difficulty swallowing the tablet whole please discuss with your pharmacist how to take the medication safely.  Take Eliquis exactly as prescribed by your doctor and DO NOT stop taking Eliquis without talking to the doctor who prescribed the medication.  Stopping may increase your risk of developing a stroke.  Refill your prescription before you run out.  After discharge, you should have regular check-up appointments with your healthcare provider that is prescribing your Eliquis.  In the future your dose may need to be changed if your kidney function or weight changes by a significant amount or as you get older.  What do you do if you miss a dose? If you miss a dose, take it as soon as you remember on the same day and resume taking twice daily.  Do not take more than one dose of ELIQUIS at the same time  to make up a missed dose.  Important Safety Information A possible side effect of Eliquis is bleeding. You should call your healthcare provider right away if you experience any of the following: Bleeding from an injury or your nose that does not stop. Unusual colored urine (red or dark brown) or unusual colored stools (red or black). Unusual bruising for unknown reasons. A serious fall or if you hit your head (even if there is no bleeding).  Some medicines may interact with Eliquis and might increase your risk of bleeding or clotting while on Eliquis. To help avoid this, consult your healthcare provider or pharmacist prior to using any new prescription or non-prescription medications, including herbals, vitamins,  non-steroidal anti-inflammatory drugs (NSAIDs) and supplements.  This website has more information on Eliquis (apixaban): http://[email or web address].com/eliquis/home

## 2022-03-13 ENCOUNTER — Other Ambulatory Visit (HOSPITAL_COMMUNITY): Payer: Self-pay

## 2022-03-13 DIAGNOSIS — I10 Essential (primary) hypertension: Secondary | ICD-10-CM

## 2022-03-13 DIAGNOSIS — I4891 Unspecified atrial fibrillation: Secondary | ICD-10-CM | POA: Diagnosis not present

## 2022-03-13 DIAGNOSIS — K810 Acute cholecystitis: Secondary | ICD-10-CM | POA: Diagnosis not present

## 2022-03-13 LAB — CBC WITH DIFFERENTIAL/PLATELET
Abs Immature Granulocytes: 0.34 10*3/uL — ABNORMAL HIGH (ref 0.00–0.07)
Basophils Absolute: 0.1 10*3/uL (ref 0.0–0.1)
Basophils Relative: 0 %
Eosinophils Absolute: 0 10*3/uL (ref 0.0–0.5)
Eosinophils Relative: 0 %
HCT: 30.7 % — ABNORMAL LOW (ref 36.0–46.0)
Hemoglobin: 10 g/dL — ABNORMAL LOW (ref 12.0–15.0)
Immature Granulocytes: 2 %
Lymphocytes Relative: 8 %
Lymphs Abs: 1.3 10*3/uL (ref 0.7–4.0)
MCH: 28.3 pg (ref 26.0–34.0)
MCHC: 32.6 g/dL (ref 30.0–36.0)
MCV: 87 fL (ref 80.0–100.0)
Monocytes Absolute: 1 10*3/uL (ref 0.1–1.0)
Monocytes Relative: 6 %
Neutro Abs: 13.9 10*3/uL — ABNORMAL HIGH (ref 1.7–7.7)
Neutrophils Relative %: 84 %
Platelets: 377 10*3/uL (ref 150–400)
RBC: 3.53 MIL/uL — ABNORMAL LOW (ref 3.87–5.11)
RDW: 13.7 % (ref 11.5–15.5)
WBC: 16.6 10*3/uL — ABNORMAL HIGH (ref 4.0–10.5)
nRBC: 0 % (ref 0.0–0.2)

## 2022-03-13 LAB — COMPREHENSIVE METABOLIC PANEL
ALT: 28 U/L (ref 0–44)
AST: 27 U/L (ref 15–41)
Albumin: 3.1 g/dL — ABNORMAL LOW (ref 3.5–5.0)
Alkaline Phosphatase: 170 U/L — ABNORMAL HIGH (ref 38–126)
Anion gap: 12 (ref 5–15)
BUN: 26 mg/dL — ABNORMAL HIGH (ref 8–23)
CO2: 22 mmol/L (ref 22–32)
Calcium: 8.7 mg/dL — ABNORMAL LOW (ref 8.9–10.3)
Chloride: 105 mmol/L (ref 98–111)
Creatinine, Ser: 1.67 mg/dL — ABNORMAL HIGH (ref 0.44–1.00)
GFR, Estimated: 32 mL/min — ABNORMAL LOW (ref >60)
Glucose, Bld: 106 mg/dL — ABNORMAL HIGH (ref 70–99)
Potassium: 4.5 mmol/L (ref 3.5–5.1)
Sodium: 139 mmol/L (ref 135–145)
Total Bilirubin: 0.6 mg/dL (ref 0.3–1.2)
Total Protein: 6.7 g/dL (ref 6.5–8.1)

## 2022-03-13 LAB — MAGNESIUM: Magnesium: 2.1 mg/dL (ref 1.7–2.4)

## 2022-03-13 LAB — T3: T3, Total: 53 ng/dL — ABNORMAL LOW (ref 71–180)

## 2022-03-13 LAB — BRAIN NATRIURETIC PEPTIDE: B Natriuretic Peptide: 423.1 pg/mL — ABNORMAL HIGH (ref 0.0–100.0)

## 2022-03-13 LAB — CULTURE, BLOOD (SINGLE)
Culture: NO GROWTH
Special Requests: ADEQUATE

## 2022-03-13 LAB — SURGICAL PATHOLOGY

## 2022-03-13 MED ORDER — HYDRALAZINE HCL 25 MG PO TABS
25.0000 mg | ORAL_TABLET | Freq: Four times a day (QID) | ORAL | Status: DC | PRN
  Administered 2022-03-13: 25 mg via ORAL
  Filled 2022-03-13: qty 1

## 2022-03-13 MED ORDER — MELATONIN 3 MG PO TABS
3.0000 mg | ORAL_TABLET | Freq: Every evening | ORAL | Status: DC | PRN
  Administered 2022-03-13: 3 mg via ORAL
  Filled 2022-03-13: qty 1

## 2022-03-13 MED ORDER — METOPROLOL TARTRATE 100 MG PO TABS
100.0000 mg | ORAL_TABLET | Freq: Two times a day (BID) | ORAL | Status: DC
  Administered 2022-03-13: 100 mg via ORAL
  Filled 2022-03-13: qty 1

## 2022-03-13 MED ORDER — HYDRALAZINE HCL 50 MG PO TABS: 50.0000 mg | ORAL_TABLET | Freq: Three times a day (TID) | ORAL | Status: DC

## 2022-03-13 MED ORDER — HYDRALAZINE HCL 25 MG PO TABS
25.0000 mg | ORAL_TABLET | Freq: Three times a day (TID) | ORAL | Status: DC
  Administered 2022-03-13: 25 mg via ORAL
  Filled 2022-03-13: qty 1

## 2022-03-13 MED ORDER — TIROSINT 125 MCG PO CAPS
100.0000 ug | ORAL_CAPSULE | Freq: Every day | ORAL | 0 refills | Status: DC
  Filled 2022-03-13: qty 30, 37d supply, fill #0

## 2022-03-13 MED ORDER — APIXABAN 5 MG PO TABS
5.0000 mg | ORAL_TABLET | Freq: Two times a day (BID) | ORAL | 0 refills | Status: DC
  Filled 2022-03-13: qty 60, 30d supply, fill #0

## 2022-03-13 MED ORDER — HYDRALAZINE HCL 20 MG/ML IJ SOLN
10.0000 mg | Freq: Four times a day (QID) | INTRAMUSCULAR | Status: DC | PRN
  Administered 2022-03-13: 10 mg via INTRAVENOUS
  Filled 2022-03-13: qty 1

## 2022-03-13 MED ORDER — APIXABAN 5 MG PO TABS
5.0000 mg | ORAL_TABLET | Freq: Two times a day (BID) | ORAL | Status: DC
  Administered 2022-03-13: 5 mg via ORAL
  Filled 2022-03-13: qty 1

## 2022-03-13 NOTE — Discharge Summary (Signed)
Lynn Joseph RFF:[id number] DOB: 12-03-1945 DOA: 03/08/2022  PCP: Velna Hatchet, MD  Admit date: 03/08/2022  Discharge date: 03/13/2022  Admitted From: Home   Disposition:  Home   Recommendations for Outpatient Follow-up:   Follow up with PCP in 1-2 weeks  PCP Please obtain BMP/CBC, 2 view CXR in 1week,  (see Discharge instructions)   PCP Please follow up on the following pending results: Check CBC, BMP, magnesium and TSH in 7 to 10 days.  Needs close outpatient follow-up with IR for her percutaneous biliary drain management and with general surgery in the next 7 to 10 days.   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  Plan was to pursue surgery as an outpatient for cholecystostomy. On admission, she was found to be in A-fib with RVR.  Cardiology, general surgery consulted.  Cardiology considering cardioversion at some point.                                                                  Hospital Course   Common Bile Duct Obstruction s/p Perc Chole Tube CT abdomen pelvis with small subcapsular collection adjacent to percutaneous biliary drain, likely post procedural hematoma or biloma,  S/p cholangiogram by IR > catheter well position and normally functioning, patent cystic duct.  Internal/external biliary drainage catheter was capped  She was seen by general surgery initially placed on Rocephin and Flagyl combination initially underwent lap cholecystectomy on 03/11/2022 tolerated the procedure well, postsurgery antibiotics stopped by surgery, has right upper quadrant percutaneous biliary drain which will be continued upon discharge and she will follow-up with IR for management of this.  Discussed with general surgery no need for continued antibiotics outpatient follow-up with general surgery stable for anticoagulation and discharge.   New onset atrial fibrillation with RVR, [place] vas 2 score of greater at least 3- Echo with EF 55-60%, no RWMA, now off of diltiazem drip, on oral beta-blocker and currently seems to be in sinus.  As per general surgery and cardiology commence Eliquis on 03/13/2022 thereafter follow-up with cardiology.   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   Hypertension Home medications Lopressor and hydralazine continued.   Normocytic anemia  Hemoglobin currently stable in the range of 9-10   Obesity  Hx Roux en y gastric bypass Body mass index is 37.79 kg/m.  Follow-up with PCP for monitoring.   Discharge diagnosis     Principal Problem:   New onset atrial fibrillation (HCC) Active Problems:   Obesity   Hypothyroidism   Benign essential hypertension   Acute cholecystitis   AKI (acute kidney injury) [facility])    Discharge instructions    Discharge Instructions     Amb referral to AFIB Clinic   Complete by: As directed    Discharge  instructions   Complete by: As directed    Follow with Primary MD Velna Hatchet, MD in 7 days   Get CBC, CMP, TSH, magnesium, 2 view Chest X ray -  checked next visit with your primary MD   Activity: As tolerated with Full fall precautions use walker/cane & assistance as needed  Disposition Home    Diet: Heart Healthy    Special Instructions: If you have smoked or chewed Tobacco  in the last 2 yrs please stop smoking, stop any regular Alcohol  and or any Recreational drug use.  On your next visit with your primary care physician please Get Medicines reviewed and adjusted.  Please request your Prim.MD to go over all Hospital Tests and Procedure/Radiological results at the follow up, please get all Hospital records sent to your Prim MD by signing hospital release before you go home.  If you experience worsening of your admission symptoms, develop shortness of breath, life threatening emergency, suicidal or homicidal thoughts you must seek medical attention immediately by calling 911 or calling your MD immediately  if symptoms less severe.  You Must read complete instructions/literature along with all the possible adverse reactions/side effects for all the Medicines you take and that have been prescribed to you. Take any new Medicines after you have completely understood and accpet all the possible adverse reactions/side effects.   Discharge wound care:   Complete by: As directed    Keep your biliary drain clean and dry at all times.   Increase activity slowly   Complete by: As directed        Discharge Medications   Allergies as of 03/13/2022       Reactions   Amlodipine Swelling, Other (See Comments)   Lowered BP to much, lethargic    Coreg [carvedilol]    Penicillin G Sodium Other (See Comments)   GI upset         Medication List     STOP taking these medications    amoxicillin-clavulanate 875-125 MG tablet Commonly known as: AUGMENTIN   Aspirin 81 81 MG chewable  tablet Generic drug: aspirin   Calcium 600+D High Potency 600-400 MG-UNIT tablet Generic drug: Calcium Carbonate-Vitamin D   cetirizine 10 MG tablet Commonly known as: ZYRTEC   hydrOXYzine 25 MG tablet Commonly known as: ATARAX   multivitamin with minerals Tabs tablet   rosuvastatin 5 MG tablet Commonly known as: CRESTOR   triamterene-hydrochlorothiazide 37.5-25 MG capsule Commonly known as: DYAZIDE       TAKE these medications    apixaban 5 MG Tabs tablet Commonly known as: ELIQUIS Take 1 tablet (5 mg total) by mouth 2 (two)  times daily.   benzonatate 200 MG capsule Commonly known as: TESSALON Take 200 mg by mouth as needed for cough.   gabapentin 100 MG capsule Commonly known as: NEURONTIN Take 100 mg by mouth at bedtime as needed (pain).   hydrALAZINE 25 MG tablet Commonly known as: APRESOLINE Take 25 mg by mouth 3 (three) times daily. What changed: Another medication with the same name was removed. Continue taking this medication, and follow the directions you see here.   metoprolol tartrate 100 MG tablet Commonly known as: LOPRESSOR Take 100 mg by mouth 2 (two) times daily.   oxyCODONE 5 MG immediate release tablet Commonly known as: Oxy IR/ROXICODONE Take 1 tablet (5 mg total) by mouth every 6 (six) hours as needed for up to 3 days for severe pain. What changed:  when to take this reasons to take this   Tirosint 125 MCG Caps Generic drug: Levothyroxine Sodium Take 0.8 capsules (100 mcg total) by mouth daily. Start taking on: March 16, 2022 What changed:  how much to take These instructions start on March 16, 2022. If you are unsure what to do until then, ask your doctor or other care provider.   TYLENOL PO Take 2 tablets by mouth 2 (two) times daily as needed (pain).   VITAMIN B-1 PO Take 1 tablet by mouth daily.               Durable Medical Equipment  (From admission, onward)           Start     Ordered   03/12/22 1139   For home use only DME Walker rolling  Once       Question Answer Comment  Walker: With [place] Wheels   Patient needs a walker to treat with the following condition Decreased functional mobility and endurance      03/12/22 1139              Discharge Care Instructions  (From admission, onward)           Start     Ordered   03/13/22 0000  Discharge wound care:       Comments: Keep your biliary drain clean and dry at all times.   03/13/22 5027             Follow-up Information     Dwan Bolt, MD. Go on 04/03/2022.   Specialty: General Surgery Why: follow up on12/12/23 at 9:20 am. Please arrive 30 minutes early to complete check in, and bring photo ID and insurance card. Contact information: [address] [place] [address] [phone]         [facility] Follow up.   Why: (Adapt)- rolling walker arranged- to be delivered to room prior to discharge Contact information: [address] [place] [address] [phone]         [facility] Follow up.   Specialty: Rehabilitation Why: outpt PT referral made- they will contact you to schedule- or if you have not heard from them by end of week you may call them after the holiday Contact information: [place] [phone] [address] [place]        Velna Hatchet, MD. Schedule an appointment as soon as possible for a visit in 1 week(s).   Specialty: Internal Medicine Contact information: [address] [place] [address] [phone]         Aletta Edouard, MD. Schedule  an appointment as soon as possible for a visit in 1 week(s).   Specialties: Interventional Radiology, Radiology Why: or your IR MD in 1 week for Hepatic drain management Contact information: [place]  [address] [phone]                 Major procedures and Radiology  Reports - PLEASE review detailed and final reports thoroughly  -     DG Chest Port 1 View  Result Date: 03/11/2022 CLINICAL DATA:  [id number] with shortness of breath and atrial fibrillation. EXAM: PORTABLE CHEST 1 VIEW COMPARISON:  Chest CT without contrast 03/08/2022 FINDINGS: 6:57 a.m. The heart is enlarged. Central vascular prominence is again noted. There is no overt edema. Moderately elevated right hemidiaphragm again is seen and an overlying small or moderate sized right pleural effusion. Right lower lung field is largely obscured, previously demonstrating atelectasis or consolidation in the lower lobe alongside the effusion. The remaining lungs are clear. The overall aeration seems unchanged. Aortic tortuosity and calcification are again noted.  Osteopenia. IMPRESSION: 1. Cardiomegaly with central vascular prominence. No overt edema. 2. Small to moderate sized right pleural effusion. 3. Right lower lung field is obscured, on CT demonstrating atelectasis or consolidation in the lower lobe alongside the effusion. No new abnormality is suspected. Electronically Signed   By: Telford Nab M.D.   On: 03/11/2022 07:10   ECHOCARDIOGRAM COMPLETE  Result Date: 03/09/2022    ECHOCARDIOGRAM REPORT   Patient Name:   Lynn Joseph Date of Exam: 03/09/2022 Medical Rec #:  [id number]         Height:       65.0 in Accession #:    [id number]        Weight:       227.1 lb Date of Birth:  22-May-1945         BSA:          2.088 m Patient Age:    57 years          BP:           146/71 mmHg Patient Gender: F                 HR:           105 bpm. Exam Location:  Inpatient Procedure: 2D Echo, Cardiac Doppler and Color Doppler Indications:    Atrial fibrillation  History:        Patient has prior history of Echocardiogram examinations, most                 recent 10/28/2018. CHF; Risk Factors:Hypertension.  Sonographer:    Clayton Lefort RDCS (AE) Referring Phys: Karmen Bongo IMPRESSIONS  1. Left ventricular ejection  fraction, by estimation, is 55 to 60%. The left ventricle has normal function. The left ventricle has no regional wall motion abnormalities. Left ventricular diastolic function could not be evaluated.  2. Right ventricular systolic function is normal. The right ventricular size is normal. There is normal pulmonary artery systolic pressure. The estimated right ventricular systolic pressure is 21.2 mmHg.  3. The mitral valve is grossly normal. Trivial mitral valve regurgitation. No evidence of mitral stenosis.  4. The aortic valve is tricuspid. There is mild calcification of the aortic valve. Aortic valve regurgitation is not visualized. Aortic valve sclerosis is present, with no evidence of aortic valve stenosis.  5. The inferior vena cava is normal in size with greater than 50% respiratory variability, suggesting right  atrial pressure of 3 mmHg. Comparison(s): Changes from prior study are noted. EF unchanged. Patient now in Afib. FINDINGS  Left Ventricle: Left ventricular ejection fraction, by estimation, is 55 to 60%. The left ventricle has normal function. The left ventricle has no regional wall motion abnormalities. The left ventricular internal cavity size was normal in size. There is  no left ventricular hypertrophy. Left ventricular diastolic function could not be evaluated due to atrial fibrillation. Left ventricular diastolic function could not be evaluated. Right Ventricle: The right ventricular size is normal. No increase in right ventricular wall thickness. Right ventricular systolic function is normal. There is normal pulmonary artery systolic pressure. The tricuspid regurgitant velocity is 1.79 m/s, and  with an assumed right atrial pressure of 3 mmHg, the estimated right ventricular systolic pressure is 34.2 mmHg. Left Atrium: Left atrial size was normal in size. Right Atrium: Right atrial size was normal in size. Pericardium: There is no evidence of pericardial effusion. Presence of epicardial fat  layer. Mitral Valve: The mitral valve is grossly normal. Trivial mitral valve regurgitation. No evidence of mitral valve stenosis. Tricuspid Valve: The tricuspid valve is grossly normal. Tricuspid valve regurgitation is trivial. No evidence of tricuspid stenosis. Aortic Valve: The aortic valve is tricuspid. There is mild calcification of the aortic valve. Aortic valve regurgitation is not visualized. Aortic valve sclerosis is present, with no evidence of aortic valve stenosis. Aortic valve mean gradient measures 4.0 mmHg. Aortic valve peak gradient measures 6.4 mmHg. Aortic valve area, by VTI measures 2.59 cm. Pulmonic Valve: The pulmonic valve was grossly normal. Pulmonic valve regurgitation is trivial. No evidence of pulmonic stenosis. Aorta: The aortic root and ascending aorta are structurally normal, with no evidence of dilitation. Venous: The inferior vena cava is normal in size with greater than 50% respiratory variability, suggesting right atrial pressure of 3 mmHg. IAS/Shunts: The atrial septum is grossly normal.  LEFT VENTRICLE PLAX 2D LVIDd:         4.40 cm LVIDs:         2.70 cm LV PW:         1.20 cm LV IVS:        1.10 cm LVOT diam:     2.00 cm LV SV:         51 LV SV Index:   25 LVOT Area:     3.14 cm  RIGHT VENTRICLE             IVC RV Basal diam:  3.10 cm     IVC diam: 1.60 cm RV S prime:     12.90 cm/s TAPSE (M-mode): 1.8 cm LEFT ATRIUM           Index        RIGHT ATRIUM           Index LA diam:      4.20 cm 2.01 cm/m   RA Area:     16.30 cm LA Vol (A2C): 55.5 ml 26.58 ml/m  RA Volume:   42.50 ml  20.36 ml/m LA Vol (A4C): 61.0 ml 29.22 ml/m  AORTIC VALVE AV Area (Vmax):    2.49 cm AV Area (Vmean):   2.35 cm AV Area (VTI):     2.59 cm AV Vmax:           126.00 cm/s AV Vmean:          91.300 cm/s AV VTI:            0.198 m AV Peak Grad:  6.4 mmHg AV Mean Grad:      4.0 mmHg LVOT Vmax:         100.00 cm/s LVOT Vmean:        68.300 cm/s LVOT VTI:          0.163 m LVOT/AV VTI ratio: 0.82   AORTA Ao Root diam: 3.20 cm Ao Asc diam:  3.70 cm TRICUSPID VALVE TR Peak grad:   12.8 mmHg TR Vmax:        179.00 cm/s  SHUNTS Systemic VTI:  0.16 m Systemic Diam: 2.00 cm Eleonore Chiquito MD Electronically signed by Eleonore Chiquito MD Signature Date/Time: 03/09/2022/5:19:26 PM    Final    IR Sinus/Fist Tube Chk-Non GI  Result Date: 03/09/2022 INDICATION: 76 year old female who presents with a biliary drainage catheter placed at an outside institution. EXAM: SINUS TRACT INJECTION/FISTULOGRAM MEDICATIONS: None. ANESTHESIA/SEDATION: None. FLUOROSCOPY TIME:  Radiation exposure index: 2 mGy reference air kerma COMPLICATIONS: None immediate. PROCEDURE: Informed written consent was obtained from the patient after a thorough discussion of the procedural risks, benefits and alternatives. All questions were addressed. Maximal Sterile Barrier Technique was utilized including caps, mask, sterile gowns, sterile gloves, sterile drape, hand hygiene and skin antiseptic. A timeout was performed prior to the initiation of the procedure. A gentle hand injection of contrast material was performed. This is an 79 [place] internal/external biliary drainage catheter. The drainage catheter is patent. No evidence of biliary ductal dilatation. Contrast material passes freely into the ampulla. Additionally, contrast material refluxes through the patent cystic duct and partially opacifies the collapsed gallbladder. IMPRESSION: 1. Eight [place] internal/external biliary drainage catheter is well-positioned and normally functioning. 2. Patent cystic duct. The internal/external biliary drainage catheter was capped. Electronically Signed   By: Jacqulynn Cadet M.D.   On: 03/09/2022 13:07   CT CHEST ABDOMEN PELVIS WO CONTRAST  Result Date: 03/08/2022 CLINICAL DATA:  Right upper quadrant pain, recent per chole tube. EXAM: CT CHEST, ABDOMEN AND PELVIS WITHOUT CONTRAST TECHNIQUE: Multidetector CT imaging of the chest, abdomen and pelvis was  performed following the standard protocol without IV contrast. RADIATION DOSE REDUCTION: This exam was performed according to the departmental dose-optimization program which includes automated exposure control, adjustment of the mA and/or kV according to patient size and/or use of iterative reconstruction technique. COMPARISON:  None Available. FINDINGS: CT CHEST FINDINGS Cardiovascular: Normal heart size. No pericardial effusion. Normal caliber thoracic aorta with mild calcified plaque. No visible coronary artery calcifications. Mediastinum/Nodes: Small hiatal hernia. No pathologically enlarged lymph nodes seen in the chest Lungs/Pleura: Central airways are patent small right pleural effusion and right basilar atelectasis. No consolidation, pleural effusion or pneumothorax Musculoskeletal: No chest wall mass or suspicious bone lesions identified. CT ABDOMEN PELVIS FINDINGS Hepatobiliary: Small subcapsular collection located adjacent to the percutaneous biliary drain measuring 5.3 x 1.3 cm. Decompressed gallbladder with gallstones evidence of biliary ductal dilation. Common bile duct drain in place. No evidence of biliary ductal dilation. Pancreas: Unremarkable. No pancreatic ductal dilatation or surrounding inflammatory changes. Spleen: Normal in size without focal abnormality. Adrenals/Urinary Tract: Bilateral adrenal glands are unremarkable. No hydronephrosis or nephrolithiasis. Bladder is unremarkable. Stomach/Bowel: Postsurgical changes of the stomach. No bowel wall thickening, inflammatory change or evidence of obstruction. Normal appendix Vascular/Lymphatic: Aortic atherosclerosis. No enlarged abdominal or pelvic lymph nodes. Reproductive: Uterus and bilateral adnexa are unremarkable. Other: Trace high-density free fluid seen in the pelvis. Musculoskeletal: No acute or significant osseous findings. IMPRESSION: 1. Small subcapsular collection located adjacent to the percutaneous biliary drain, likely  postprocedural  hematoma or biloma. HIDA scan could assist with further evaluation. 2. Small right pleural effusion and right basilar atelectasis. 3. Trace high-density free fluid seen in the pelvis, likely due to small volume hemoperitoneum related to recent procedure. 4. Aortic Atherosclerosis (ICD10-I70.0). Electronically Signed   By: Yetta Glassman M.D.   On: 03/08/2022 15:48    Micro Results    Recent Results (from the past 240 hour(s))  Culture, blood (single)     Status: None   Collection Time: 03/08/22  3:47 PM   Specimen: Right Antecubital; Blood  Result Value Ref Range Status   Specimen Description   Final    RIGHT ANTECUBITAL BLOOD Performed at [facility] [address], [place], [place] [address]    Special Requests   Final    BOTTLES DRAWN AEROBIC AND ANAEROBIC Blood Culture adequate volume Performed at [facility], [address] [place], [place] [address]    Culture   Final    NO GROWTH 5 DAYS Performed at [facility], [place] [address], [place], [place] [address]    Report Status 03/13/2022 FINAL  Final  Surgical PCR screen     Status: None   Collection Time: 03/11/22  4:15 AM   Specimen: Nasal Mucosa; Nasal Swab  Result Value Ref Range Status   MRSA, PCR NEGATIVE NEGATIVE Final   Staphylococcus aureus NEGATIVE NEGATIVE Final    Comment: (NOTE) The Xpert SA Assay (FDA approved for NASAL specimens in patients 69 years of age and older), is one component of a comprehensive surveillance program. It is not intended to diagnose infection nor to guide or monitor treatment. Performed at [facility], [place] [address], [place], [place] [address]     Today   Subjective    Lynn Joseph today has no headache,no chest abdominal pain,no new weakness tingling or numbness, feels much better wants to go home today.    Objective   Blood pressure (!) 180/67, pulse 81, temperature 98 F (36.7 C), temperature source Oral, resp.  rate 20, height '5\' 5"'$  (1.651 m), weight 108.1 kg, SpO2 95 %.   Intake/Output Summary (Last 24 hours) at 03/13/2022 0824 Last data filed at 03/12/2022 2117 Gross per 24 hour  Intake 240 ml  Output --  Net 240 ml    Exam  Awake Alert, No new F.N deficits,    .AT,PERRAL Supple Neck,   Symmetrical Chest wall movement, Good air movement bilaterally, CTAB RRR,No Gallops,   +ve B.Sounds, Abd Soft, Non tender, right upper quadrant percutaneous drain under bandage and capped. No Cyanosis, Clubbing or edema    Data Review   Recent Labs  Lab 03/08/22 1404 03/09/22 0826 03/10/22 0117 03/11/22 0056 03/12/22 0106 03/13/22 0101  WBC 15.9* 11.2* 9.8 9.2 16.7* 16.6*  HGB 11.0* 11.2* 9.8* 9.0* 8.3* 10.0*  HCT 35.1* 34.2* 30.3* 27.8* 26.6* 30.7*  PLT 416* 334 296 266 250 377  MCV 87.1 85.9 87.6 86.6 90.2 87.0  MCH 27.3 28.1 28.3 28.0 28.1 28.3  MCHC 31.3 32.7 32.3 32.4 31.2 32.6  RDW 13.8 13.8 13.8 13.8 13.7 13.7  LYMPHSABS 1.4 1.0 1.3  --  0.6* 1.3  MONOABS 1.6* 1.0 1.1*  --  0.6 1.0  EOSABS 0.1 0.1 0.1  --  0.0 0.0  BASOSABS 0.1 0.0 0.1  --  0.0 0.1    Recent Labs  Lab 03/08/22 1404 03/08/22 1433 03/09/22 0826 03/10/22 0117 03/11/22 0056 03/11/22 0650 03/12/22 0106 03/13/22 0101  NA 133*  --  139 137 139  --  138 139  K 4.5  --  4.1 3.8 4.3  --  4.7 4.5  MG 1.6*  --  2.1 1.8  --   --  1.4* 2.1  CL 100  --  109 110 112*  --  108 105  CO2 18*  --  15* 16* 20*  --  20* 22  GLUCOSE 121*  --  97 110* 104*  --  155* 106*  BUN 49*  --  41* 36* 30*  --  26* 26*  CREATININE 2.71*  --  2.27* 2.03* 1.93*  --  1.76* 1.67*  CALCIUM 9.4  --  9.0 8.4* 8.1*  --  8.2* 8.7*  AST 16  --  21 21  --   --  23 27  ALT 33  --  35 28  --   --  26 28  ALKPHOS 271*  --  277* 225*  --   --  143* 170*  BILITOT 0.9  --  0.8 0.7  --   --  0.6 0.6  ALBUMIN 4.1  --  3.1* 2.7*  --   --  2.4* 3.1*  LATICACIDVEN  --  0.8  --   --   --   --   --   --   INR  --   --   --   --   --  1.3*  --   --    TSH  --  0.241*  --   --   --   --  0.127*  --   BNP 180.3*  --   --   --   --  660.0* 435.5* 423.1*    Total Time in preparing paper work, data evaluation and todays exam - 35 minutes  Lala Lund M.D on 03/13/2022 at 8:24 AM  [facility]

## 2022-03-13 NOTE — Progress Notes (Signed)
ANTICOAGULATION CONSULT NOTE - Initial Consult  Pharmacy Consult for Apixaban  Indication: atrial fibrillation  Allergies  Allergen Reactions   Amlodipine Swelling and Other (See Comments)    Lowered BP to much, lethargic    Coreg [Carvedilol]    Penicillin G Sodium Other (See Comments)    GI upset     Patient Measurements: Height: '5\' 5"'$  (165.1 cm) Weight: 108.1 kg (238 lb 5.1 oz) IBW/kg (Calculated) : 57   Vital Signs: Temp: 97.8 F (36.6 C) (11/21 0435) Temp Source: Oral (11/21 0435) BP: 181/70 (11/21 0435) Pulse Rate: 82 (11/21 0435)  Labs: Recent Labs    03/11/22 0056 03/11/22 0650 03/12/22 0106 03/13/22 0101  HGB 9.0*  --  8.3* 10.0*  HCT 27.8*  --  26.6* 30.7*  PLT 266  --  250 377  LABPROT  --  15.7*  --   --   INR  --  1.3*  --   --   CREATININE 1.93*  --  1.76* 1.67*    Estimated Creatinine Clearance: 35 mL/min (A) (by C-G formula based on SCr of 1.67 mg/dL (H)).   Medical History: Past Medical History:  Diagnosis Date   Anemia of chronic disease    Chronic diarrhea    Glaucoma    HTN (hypertension)    Hypertension    Hypothyroid    Obesity    Skin cancer    Assessment: POD#2 lap chole, in afib, starting apixaban, Hgb stable  Goal of Therapy:  Monitor platelets by anticoagulation protocol: Yes   Plan:  Start Apixaban 5 mg BID Daily CBC while inpatient Monitor for bleeding  Narda Bonds, PharmD, BCPS Clinical Pharmacist Phone: [phone]

## 2022-03-13 NOTE — Progress Notes (Signed)
Patient was given discharge instructions and stated understanding.  Daughter was also in her room and listened to discharge instructions as well.

## 2022-03-13 NOTE — Plan of Care (Signed)
  Problem: Education: Goal: Knowledge of disease or condition will improve Outcome: Adequate for Discharge Goal: Understanding of medication regimen will improve Outcome: Adequate for Discharge   Problem: Activity: Goal: Ability to tolerate increased activity will improve Outcome: Adequate for Discharge   Problem: Cardiac: Goal: Ability to achieve and maintain adequate cardiopulmonary perfusion will improve Outcome: Adequate for Discharge   Problem: Health Behavior/Discharge Planning: Goal: Ability to safely manage health-related needs after discharge will improve Outcome: Adequate for Discharge   Problem: Education: Goal: Knowledge of General Education information will improve Description: Including pain rating scale, medication(s)/side effects and non-pharmacologic comfort measures Outcome: Adequate for Discharge   Problem: Health Behavior/Discharge Planning: Goal: Ability to manage health-related needs will improve Outcome: Adequate for Discharge   Problem: Clinical Measurements: Goal: Ability to maintain clinical measurements within normal limits will improve Outcome: Adequate for Discharge Goal: Will remain free from infection Outcome: Adequate for Discharge Goal: Diagnostic test results will improve Outcome: Adequate for Discharge Goal: Respiratory complications will improve Outcome: Adequate for Discharge Goal: Cardiovascular complication will be avoided Outcome: Adequate for Discharge   Problem: Activity: Goal: Risk for activity intolerance will decrease Outcome: Adequate for Discharge   Problem: Nutrition: Goal: Adequate nutrition will be maintained Outcome: Adequate for Discharge   Problem: Coping: Goal: Level of anxiety will decrease Outcome: Adequate for Discharge   Problem: Elimination: Goal: Will not experience complications related to bowel motility Outcome: Adequate for Discharge Goal: Will not experience complications related to urinary  retention Outcome: Adequate for Discharge   Problem: Pain Managment: Goal: General experience of comfort will improve Outcome: Adequate for Discharge   Problem: Safety: Goal: Ability to remain free from injury will improve Outcome: Adequate for Discharge   Problem: Skin Integrity: Goal: Risk for impaired skin integrity will decrease Outcome: Adequate for Discharge   Problem: Acute Rehab PT Goals(only PT should resolve) Goal: Pt Will Ambulate Outcome: Adequate for Discharge Goal: Pt/caregiver will Perform Home Exercise Program Outcome: Adequate for Discharge

## 2022-03-13 NOTE — Progress Notes (Signed)
Rounding Note    Patient Name: Lynn Joseph Date of Encounter: 03/13/2022  [place] Cardiologist: Early Osmond, MD   Subjective   Hopeful to go home today. Had a panic attack last night, but better this morning.   Inpatient Medications    Scheduled Meds:  apixaban  5 mg Oral BID   hydrALAZINE  25 mg Oral Q8H   metoprolol tartrate  100 mg Oral BID   mupirocin ointment  1 Application Nasal BID   sodium chloride flush  3 mL Intravenous Q12H   Continuous Infusions:  PRN Meds: acetaminophen **OR** acetaminophen, diltiazem, gabapentin, hydrALAZINE, melatonin, metoprolol tartrate, ondansetron **OR** ondansetron (ZOFRAN) IV, oxyCODONE   Vital Signs    Vitals:   03/13/22 0023 03/13/22 0435 03/13/22 0436 03/13/22 0617  BP: (!) 186/70 (!) 181/70  (!) 167/67  Pulse: 76 82  82  Resp: '20 20  17  '$ Temp:  97.8 F (36.6 C)    TempSrc:  Oral    SpO2:  94%    Weight:   108.1 kg   Height:        Intake/Output Summary (Last 24 hours) at 03/13/2022 0732 Last data filed at 03/12/2022 2117 Gross per 24 hour  Intake 240 ml  Output --  Net 240 ml      03/13/2022    4:36 AM 03/12/2022    5:00 AM 03/11/2022    8:29 AM  Last 3 Weights  Weight (lbs) 238 lb 5.1 oz 238 lb 14.4 oz 235 lb 3.7 oz  Weight (kg) 108.1 kg 108.364 kg 106.7 kg      Telemetry    Sinus Rhythm - Personally Reviewed  ECG    No new tracing  Physical Exam   GEN: No acute distress.   Neck: No JVD Cardiac: RRR, no murmurs, rubs, or gallops.  Respiratory: Clear to auscultation bilaterally. GI: Soft, nontender, non-distended  MS: No edema; No deformity. Neuro:  Nonfocal  Psych: Normal affect   Labs    High Sensitivity Troponin:  No results for input(s): "TROPONINIHS" in the last 720 hours.   Chemistry Recent Labs  Lab 03/10/22 0117 03/11/22 0056 03/12/22 0106 03/13/22 0101  NA 137 139 138 139  K 3.8 4.3 4.7 4.5  CL 110 112* 108 105  CO2 16* 20* 20* 22  GLUCOSE 110*  104* 155* 106*  BUN 36* 30* 26* 26*  CREATININE 2.03* 1.93* 1.76* 1.67*  CALCIUM 8.4* 8.1* 8.2* 8.7*  MG 1.8  --  1.4* 2.1  PROT 6.1*  --  5.5* 6.7  ALBUMIN 2.7*  --  2.4* 3.1*  AST 21  --  23 27  ALT 28  --  26 28  ALKPHOS 225*  --  143* 170*  BILITOT 0.7  --  0.6 0.6  GFRNONAA 25* 27* 30* 32*  ANIONGAP '11 7 10 12    '$ Lipids No results for input(s): "CHOL", "TRIG", "HDL", "LABVLDL", "LDLCALC", "CHOLHDL" in the last 168 hours.  Hematology Recent Labs  Lab 03/11/22 0056 03/12/22 0106 03/13/22 0101  WBC 9.2 16.7* 16.6*  RBC 3.21* 2.95* 3.53*  HGB 9.0* 8.3* 10.0*  HCT 27.8* 26.6* 30.7*  MCV 86.6 90.2 87.0  MCH 28.0 28.1 28.3  MCHC 32.4 31.2 32.6  RDW 13.8 13.7 13.7  PLT 266 250 377   Thyroid  Recent Labs  Lab 03/12/22 0106  TSH 0.127*  FREET4 1.55*    BNP Recent Labs  Lab 03/11/22 0650 03/12/22 0106 03/13/22 0101  BNP  660.0* 435.5* 423.1*    DDimer No results for input(s): "DDIMER" in the last 168 hours.   Radiology    No results found.  Cardiac Studies   Echo: 03/09/22   IMPRESSIONS     1. Left ventricular ejection fraction, by estimation, is 55 to 60%. The  left ventricle has normal function. The left ventricle has no regional  wall motion abnormalities. Left ventricular diastolic function could not  be evaluated.   2. Right ventricular systolic function is normal. The right ventricular  size is normal. There is normal pulmonary artery systolic pressure. The  estimated right ventricular systolic pressure is 99.2 mmHg.   3. The mitral valve is grossly normal. Trivial mitral valve  regurgitation. No evidence of mitral stenosis.   4. The aortic valve is tricuspid. There is mild calcification of the  aortic valve. Aortic valve regurgitation is not visualized. Aortic valve  sclerosis is present, with no evidence of aortic valve stenosis.   5. The inferior vena cava is normal in size with greater than 50%  respiratory variability, suggesting right  atrial pressure of 3 mmHg.   Comparison(s): Changes from prior study are noted. EF unchanged. Patient  now in Afib.   FINDINGS   Left Ventricle: Left ventricular ejection fraction, by estimation, is 55  to 60%. The left ventricle has normal function. The left ventricle has no  regional wall motion abnormalities. The left ventricular internal cavity  size was normal in size. There is   no left ventricular hypertrophy. Left ventricular diastolic function  could not be evaluated due to atrial fibrillation. Left ventricular  diastolic function could not be evaluated.   Right Ventricle: The right ventricular size is normal. No increase in  right ventricular wall thickness. Right ventricular systolic function is  normal. There is normal pulmonary artery systolic pressure. The tricuspid  regurgitant velocity is 1.79 m/s, and   with an assumed right atrial pressure of 3 mmHg, the estimated right  ventricular systolic pressure is 42.6 mmHg.   Left Atrium: Left atrial size was normal in size.   Right Atrium: Right atrial size was normal in size.   Pericardium: There is no evidence of pericardial effusion. Presence of  epicardial fat layer.   Mitral Valve: The mitral valve is grossly normal. Trivial mitral valve  regurgitation. No evidence of mitral valve stenosis.   Tricuspid Valve: The tricuspid valve is grossly normal. Tricuspid valve  regurgitation is trivial. No evidence of tricuspid stenosis.   Aortic Valve: The aortic valve is tricuspid. There is mild calcification  of the aortic valve. Aortic valve regurgitation is not visualized. Aortic  valve sclerosis is present, with no evidence of aortic valve stenosis.  Aortic valve mean gradient measures  4.0 mmHg. Aortic valve peak gradient measures 6.4 mmHg. Aortic valve area,  by VTI measures 2.59 cm.   Pulmonic Valve: The pulmonic valve was grossly normal. Pulmonic valve  regurgitation is trivial. No evidence of pulmonic stenosis.    Aorta: The aortic root and ascending aorta are structurally normal, with  no evidence of dilitation.   Venous: The inferior vena cava is normal in size with greater than 50%  respiratory variability, suggesting right atrial pressure of 3 mmHg.   IAS/Shunts: The atrial septum is grossly normal.     Patient Profile     76 y.o. female with HTN, HLD, thyroid disorder, CRI, and obesity who is being seen 03/08/2022 for the evaluation of new-dx AF at the request of Dr. Lorin Mercy.  Pt recently referred to cardiology for CP and palpitations. Renal functio precluded use of CCTA. Nuclear stress test was not ischemic. Heart monitor did not show Afib/flutter. Review of prior echos does not show CHF.   Assessment & Plan    New onset Paroxsymal Afib with RVR -- presented to [facility] with Afib RVR in the setting of sepsis 2/2 CBD obstruction with perc tube -- placed on IV cardizem along with metoprolol for rate control with improvement in order to undergo surgery -- converted to SR and maintaining presently on metoprolol '100mg'$  BID -- has been cleared to start [place] 24hrs post op if stable Hgb. Hgb 10 today, placed on Eliquis '5mg'$  BID.    CBD obstruction s/p cholecystectomy Cholelithiasis -- completed 11/19 with Dr. Zenia Resides -- management per surgery   AKI with CKD III -- Cr peaked at 2.71, now improving to baseline 1.67 with hydration  -- follow BMET   Hyperthyroidism -- TSH 0.127, T3 42, T4 1.55   HTN -- blood pressures elevated  -- increase metoprolol to 100 mg BID, and continue hydralazine '25mg'$  TID (PTA meds)  [facility] will sign off.   Medication Recommendations:  noted above Other recommendations (labs, testing, etc):  none Follow up as an outpatient:  will arrange outpatient follow up in [place] office  For questions or updates, please contact [place] Please consult [email or web address] for contact info under        Signed, Reino Bellis, NP  03/13/2022, 7:32  AM

## 2022-03-14 ENCOUNTER — Observation Stay (HOSPITAL_BASED_OUTPATIENT_CLINIC_OR_DEPARTMENT_OTHER)
Admission: EM | Admit: 2022-03-14 | Discharge: 2022-03-16 | Disposition: A | Discharge status: Home / Self Care | Payer: Medicare Other | Attending: Internal Medicine | Admitting: Internal Medicine

## 2022-03-14 ENCOUNTER — Emergency Department (HOSPITAL_BASED_OUTPATIENT_CLINIC_OR_DEPARTMENT_OTHER): Payer: Medicare Other | Admitting: Radiology

## 2022-03-14 ENCOUNTER — Encounter (HOSPITAL_COMMUNITY): Payer: Self-pay

## 2022-03-14 ENCOUNTER — Encounter (HOSPITAL_BASED_OUTPATIENT_CLINIC_OR_DEPARTMENT_OTHER): Payer: Self-pay | Admitting: Emergency Medicine

## 2022-03-14 DIAGNOSIS — I509 Heart failure, unspecified: Secondary | ICD-10-CM | POA: Insufficient documentation

## 2022-03-14 DIAGNOSIS — D638 Anemia in other chronic diseases classified elsewhere: Secondary | ICD-10-CM | POA: Diagnosis not present

## 2022-03-14 DIAGNOSIS — Z7901 Long term (current) use of anticoagulants: Secondary | ICD-10-CM | POA: Insufficient documentation

## 2022-03-14 DIAGNOSIS — E669 Obesity, unspecified: Secondary | ICD-10-CM | POA: Diagnosis present

## 2022-03-14 DIAGNOSIS — K831 Obstruction of bile duct: Secondary | ICD-10-CM | POA: Insufficient documentation

## 2022-03-14 DIAGNOSIS — G4733 Obstructive sleep apnea (adult) (pediatric): Secondary | ICD-10-CM | POA: Diagnosis not present

## 2022-03-14 DIAGNOSIS — I130 Hypertensive heart and chronic kidney disease with heart failure and stage 1 through stage 4 chronic kidney disease, or unspecified chronic kidney disease: Secondary | ICD-10-CM | POA: Insufficient documentation

## 2022-03-14 DIAGNOSIS — Z79899 Other long term (current) drug therapy: Secondary | ICD-10-CM | POA: Diagnosis not present

## 2022-03-14 DIAGNOSIS — N1832 Chronic kidney disease, stage 3b: Secondary | ICD-10-CM | POA: Insufficient documentation

## 2022-03-14 DIAGNOSIS — D631 Anemia in chronic kidney disease: Secondary | ICD-10-CM | POA: Insufficient documentation

## 2022-03-14 DIAGNOSIS — N183 Chronic kidney disease, stage 3 unspecified: Secondary | ICD-10-CM | POA: Diagnosis present

## 2022-03-14 DIAGNOSIS — E039 Hypothyroidism, unspecified: Secondary | ICD-10-CM | POA: Diagnosis present

## 2022-03-14 DIAGNOSIS — Z85828 Personal history of other malignant neoplasm of skin: Secondary | ICD-10-CM | POA: Diagnosis not present

## 2022-03-14 DIAGNOSIS — R0602 Shortness of breath: Secondary | ICD-10-CM | POA: Diagnosis not present

## 2022-03-14 DIAGNOSIS — Z9884 Bariatric surgery status: Secondary | ICD-10-CM | POA: Diagnosis not present

## 2022-03-14 DIAGNOSIS — Z6839 Body mass index (BMI) 39.0-39.9, adult: Secondary | ICD-10-CM | POA: Diagnosis not present

## 2022-03-14 DIAGNOSIS — I10 Essential (primary) hypertension: Secondary | ICD-10-CM | POA: Diagnosis present

## 2022-03-14 DIAGNOSIS — J90 Pleural effusion, not elsewhere classified: Secondary | ICD-10-CM | POA: Diagnosis not present

## 2022-03-14 DIAGNOSIS — I480 Paroxysmal atrial fibrillation: Secondary | ICD-10-CM | POA: Diagnosis present

## 2022-03-14 DIAGNOSIS — I4891 Unspecified atrial fibrillation: Secondary | ICD-10-CM | POA: Diagnosis not present

## 2022-03-14 DIAGNOSIS — R262 Difficulty in walking, not elsewhere classified: Secondary | ICD-10-CM | POA: Insufficient documentation

## 2022-03-14 LAB — CBC WITH DIFFERENTIAL/PLATELET
Abs Immature Granulocytes: 0.27 10*3/uL — ABNORMAL HIGH (ref 0.00–0.07)
Basophils Absolute: 0.1 10*3/uL (ref 0.0–0.1)
Basophils Relative: 1 %
Eosinophils Absolute: 0.1 10*3/uL (ref 0.0–0.5)
Eosinophils Relative: 1 %
HCT: 29.7 % — ABNORMAL LOW (ref 36.0–46.0)
Hemoglobin: 9.2 g/dL — ABNORMAL LOW (ref 12.0–15.0)
Immature Granulocytes: 2 %
Lymphocytes Relative: 10 %
Lymphs Abs: 1.2 10*3/uL (ref 0.7–4.0)
MCH: 27.5 pg (ref 26.0–34.0)
MCHC: 31 g/dL (ref 30.0–36.0)
MCV: 88.7 fL (ref 80.0–100.0)
Monocytes Absolute: 0.8 10*3/uL (ref 0.1–1.0)
Monocytes Relative: 6 %
Neutro Abs: 9.7 10*3/uL — ABNORMAL HIGH (ref 1.7–7.7)
Neutrophils Relative %: 80 %
Platelets: 350 10*3/uL (ref 150–400)
RBC: 3.35 MIL/uL — ABNORMAL LOW (ref 3.87–5.11)
RDW: 14.1 % (ref 11.5–15.5)
WBC: 12.2 10*3/uL — ABNORMAL HIGH (ref 4.0–10.5)
nRBC: 0 % (ref 0.0–0.2)

## 2022-03-14 LAB — PHOSPHORUS: Phosphorus: 3.3 mg/dL (ref 2.5–4.6)

## 2022-03-14 LAB — TROPONIN I (HIGH SENSITIVITY): Troponin I (High Sensitivity): 6 ng/L (ref <18)

## 2022-03-14 LAB — MAGNESIUM
Magnesium: 1.6 mg/dL — ABNORMAL LOW (ref 1.7–2.4)
Magnesium: 1.6 mg/dL — ABNORMAL LOW (ref 1.7–2.4)

## 2022-03-14 LAB — COMPREHENSIVE METABOLIC PANEL
ALT: 18 U/L (ref 0–44)
AST: 21 U/L (ref 15–41)
Albumin: 3.6 g/dL (ref 3.5–5.0)
Alkaline Phosphatase: 134 U/L — ABNORMAL HIGH (ref 38–126)
Anion gap: 10 (ref 5–15)
BUN: 24 mg/dL — ABNORMAL HIGH (ref 8–23)
CO2: 24 mmol/L (ref 22–32)
Calcium: 8.9 mg/dL (ref 8.9–10.3)
Chloride: 104 mmol/L (ref 98–111)
Creatinine, Ser: 1.46 mg/dL — ABNORMAL HIGH (ref 0.44–1.00)
GFR, Estimated: 37 mL/min — ABNORMAL LOW (ref >60)
Glucose, Bld: 98 mg/dL (ref 70–99)
Potassium: 4 mmol/L (ref 3.5–5.1)
Sodium: 138 mmol/L (ref 135–145)
Total Bilirubin: 0.7 mg/dL (ref 0.3–1.2)
Total Protein: 6.8 g/dL (ref 6.5–8.1)

## 2022-03-14 LAB — FOLATE: Folate: 9.6 ng/mL (ref >5.9)

## 2022-03-14 LAB — IRON AND TIBC
Iron: 29 ug/dL (ref 28–170)
Saturation Ratios: 11 % (ref 10.4–31.8)
TIBC: 262 ug/dL (ref 250–450)
UIBC: 233 ug/dL

## 2022-03-14 LAB — RETICULOCYTES
Immature Retic Fract: 20.4 % — ABNORMAL HIGH (ref 2.3–15.9)
RBC.: 3.28 MIL/uL — ABNORMAL LOW (ref 3.87–5.11)
Retic Count, Absolute: 93.8 10*3/uL (ref 19.0–186.0)
Retic Ct Pct: 2.9 % (ref 0.4–3.1)

## 2022-03-14 LAB — VITAMIN B12: Vitamin B-12: 199 pg/mL (ref 180–914)

## 2022-03-14 LAB — BRAIN NATRIURETIC PEPTIDE: B Natriuretic Peptide: 371.7 pg/mL — ABNORMAL HIGH (ref 0.0–100.0)

## 2022-03-14 LAB — TSH: TSH: 0.328 u[IU]/mL — ABNORMAL LOW (ref 0.350–4.500)

## 2022-03-14 LAB — FERRITIN: Ferritin: 143 ng/mL (ref 11–307)

## 2022-03-14 LAB — T4, FREE: Free T4: 1.19 ng/dL — ABNORMAL HIGH (ref 0.61–1.12)

## 2022-03-14 MED ORDER — HYDROCODONE-ACETAMINOPHEN 5-325 MG PO TABS: 1.0000 | ORAL_TABLET | ORAL | Status: DC | PRN

## 2022-03-14 MED ORDER — HYDRALAZINE HCL 25 MG PO TABS
25.0000 mg | ORAL_TABLET | Freq: Three times a day (TID) | ORAL | Status: DC
  Administered 2022-03-14 – 2022-03-16 (×5): 25 mg via ORAL
  Filled 2022-03-14 (×5): qty 1

## 2022-03-14 MED ORDER — ALBUTEROL SULFATE (2.5 MG/3ML) 0.083% IN NEBU: 2.5000 mg | INHALATION_SOLUTION | RESPIRATORY_TRACT | Status: DC | PRN

## 2022-03-14 MED ORDER — METOPROLOL TARTRATE 50 MG PO TABS
100.0000 mg | ORAL_TABLET | Freq: Two times a day (BID) | ORAL | Status: DC
  Administered 2022-03-14 – 2022-03-16 (×4): 100 mg via ORAL
  Filled 2022-03-14 (×4): qty 2

## 2022-03-14 MED ORDER — ACETAMINOPHEN 650 MG RE SUPP: 650.0000 mg | Freq: Four times a day (QID) | RECTAL | Status: DC | PRN

## 2022-03-14 MED ORDER — FUROSEMIDE 10 MG/ML IJ SOLN
20.0000 mg | Freq: Once | INTRAMUSCULAR | Status: AC
  Administered 2022-03-14: 20 mg via INTRAVENOUS
  Filled 2022-03-14: qty 2

## 2022-03-14 MED ORDER — ACETAMINOPHEN 325 MG PO TABS: 650.0000 mg | ORAL_TABLET | Freq: Four times a day (QID) | ORAL | Status: DC | PRN

## 2022-03-14 MED ORDER — SODIUM CHLORIDE 0.9 % IV SOLN: 250.0000 mL | INTRAVENOUS | Status: DC | PRN

## 2022-03-14 MED ORDER — SODIUM CHLORIDE 0.9% FLUSH
3.0000 mL | Freq: Two times a day (BID) | INTRAVENOUS | Status: DC
  Administered 2022-03-14 – 2022-03-16 (×4): 3 mL via INTRAVENOUS

## 2022-03-14 MED ORDER — SODIUM CHLORIDE 0.9% FLUSH: 3.0000 mL | INTRAVENOUS | Status: DC | PRN

## 2022-03-14 MED ORDER — OXYCODONE HCL 5 MG PO TABS
5.0000 mg | ORAL_TABLET | Freq: Four times a day (QID) | ORAL | Status: DC | PRN
  Administered 2022-03-14 – 2022-03-15 (×2): 5 mg via ORAL
  Filled 2022-03-14 (×2): qty 1

## 2022-03-14 MED ORDER — GABAPENTIN 100 MG PO CAPS: 100.0000 mg | ORAL_CAPSULE | Freq: Every evening | ORAL | Status: DC | PRN

## 2022-03-14 MED ORDER — MAGNESIUM SULFATE 2 GM/50ML IV SOLN
2.0000 g | Freq: Once | INTRAVENOUS | Status: AC
  Administered 2022-03-14: 2 g via INTRAVENOUS
  Filled 2022-03-14: qty 50

## 2022-03-14 NOTE — Assessment & Plan Note (Signed)
-  chronic avoid nephrotoxic medications such as NSAIDs, Vanco Zosyn combo,  avoid hypotension, continue to follow renal function  

## 2022-03-14 NOTE — Assessment & Plan Note (Signed)
Chronic stable check anemia panel

## 2022-03-14 NOTE — ED Notes (Signed)
Called [facility] and spoke to [place]; informed [facility] for patient transport

## 2022-03-14 NOTE — Assessment & Plan Note (Signed)
Order IR consult for thoracentesis Obtaining fluid samples. Continue to gently diurese. Recent echogram showed no evidence of CHF but patient does appear to have evidence of fluid overload No cough or fever to suggest underlying infectious process

## 2022-03-14 NOTE — Assessment & Plan Note (Signed)
Chronic stable need to have outpatient follow-up nutrition

## 2022-03-14 NOTE — Assessment & Plan Note (Signed)
Currently has been hyperparathyroid Hold Synthroid for tonight May need to restart at a lower dose

## 2022-03-14 NOTE — ED Triage Notes (Signed)
Discharged from [facility] yesterday following cholecystectomy and dx of Afib. States that when she laid down last night she was short of breath. Denies pain.

## 2022-03-14 NOTE — Assessment & Plan Note (Signed)
Recent continue metoprolol. Held Eliquis for need for thoracentesis tomorrow morning resume when able

## 2022-03-14 NOTE — H&P (Signed)
Lynn Joseph:[id number] DOB: Jan 07, 1946 DOA: 03/14/2022     PCP: Velna Hatchet, MD   Outpatient Specialists:   CARDS: Dr.  NEphrology: Dr. Rockey Situ    Patient arrived to ER on 03/14/22 at 1132 Referred by Attending Dwyane Dee, MD   Patient coming from:    home Lives alone Chief Complaint:   Chief Complaint  Patient presents with   Shortness of Breath    HPI: Lynn Joseph is a 76 y.o. 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   Presented with   worsening Shortness of breath Was just discharged from [facility] yesterday after undergoing cholecystectomy noted to have atrial fibrillation when she went home she started to feel short of breath worse when she lays down flat patient does has known history of CHF as well as obesity Started on Eliquis last night patient has drain in place During her hospitalization her diuretics were stopped she was hypotensive she also found to have new hypothyroidism secondary to taking large dose of levothyroxine she was given fluids for her AKI and then started worsening shortness of breath persisted for her admission and gotten a little worse after discharge.  She is not able to lay down flat to sleep flat.  She has regained 15 pounds back over the past 2 days she noticed that her legs and abdomen is swollen.  She has chronic right-sided pleural effusion Prior to her discharge patient had had an echogram which was fairly unremarkable with preserved EF   Has a bad reaction to coreg but does tolerate metoprolol She does not smoke or drink Denies any fevers or chest pain although does have some right upper quadrant pain which she describes as her lungs hurting.  Not worse with inspiration. Patient has persistent expiratory high-pitched wheeze which seems to concentrated in upper airway and not audible on lung auscultation Regarding pertinent Chronic problems:      Hyperlipidemia - no longer on statin Lipid Panel     Component Value Date/Time   CHOL 160 10/28/2018 1758   TRIG 64 10/28/2018 1758   HDL 55 10/28/2018 1758   CHOLHDL 2.9 10/28/2018 1758   VLDL 13 10/28/2018 1758   LDLCALC 92 10/28/2018 1758     HTN on metoprolol, hydralazine      Hypothyroidism:  Lab Results  Component Value Date   TSH 0.328 (L) 03/14/2022   on synthroid    Morbid obesity-   BMI Readings from Last 1 Encounters:  03/13/22 39.66 kg/m      A. 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  Lab Results  Component Value Date   CREATININE 1.46 (H) 03/14/2022   CREATININE 1.67 (H) 03/13/2022   CREATININE 1.76 (H) 03/12/2022      Chronic anemia - baseline hg Hemoglobin & Hematocrit  Recent Labs    03/12/22 0106 03/13/22 0101 03/14/22 1205  HGB 8.3* 10.0* 9.2*    While in ER:  Not hypoxic but given worsening dyspnea was uncomfortable to go home Given persistent right pleural effusion plan was for patient to be admitted for thoracentesis  CXR -persistent right pleural effusion  Grossly stable moderate right pleural effusion is noted with underlying atelectasis or infiltrate.    Following Medications were ordered in ER: Medications  furosemide (LASIX) injection 20 mg (20 mg Intravenous Given 03/14/22 1308)      ED Triage Vitals  Enc Vitals Group     BP 03/14/22 1139 (!) 184/81     Pulse Rate 03/14/22 1139 78     Resp 03/14/22 1139 18     Temp 03/14/22 1139 98.5 F (36.9 C)     Temp Source 03/14/22 1139 Oral     SpO2 03/14/22 1139 94 %     Weight --      Height --      Head Circumference --      Peak Flow --      Pain Score 03/14/22 1137 0     Pain Loc --      Pain Edu? --      Excl. in [place]? --    TMAX(24)@     _________________________________________ Significant initial  Findings: Abnormal Labs Reviewed  CBC WITH DIFFERENTIAL/PLATELET - Abnormal; Notable for the following components:      Result Value   WBC 12.2 (*)    RBC 3.35 (*)    Hemoglobin 9.2 (*)    HCT 29.7 (*)    Neutro Abs 9.7 (*)    Abs Immature Granulocytes 0.27 (*)    All other components within normal limits  COMPREHENSIVE METABOLIC PANEL - Abnormal; Notable for the following components:   BUN 24 (*)    Creatinine, Ser 1.46 (*)    Alkaline Phosphatase 134 (*)    GFR, Estimated 37 (*)    All other components within normal limits  BRAIN NATRIURETIC PEPTIDE - Abnormal; Notable for the following components:   B Natriuretic Peptide 371.7 (*)    All other components within normal limits  MAGNESIUM - Abnormal; Notable for the following components:   Magnesium 1.6 (*)    All other components within normal limits  TSH - Abnormal; Notable for the following components:   TSH 0.328 (*)    All other components within normal limits  T4, FREE - Abnormal; Notable for the following components:   Free T4 1.19 (*)    All other components within normal limits    _________________________ Troponin  ordered ECG: Ordered Personally reviewed and interpreted by me showing: HR : 74 Rhythm: Normal sinus rhythm Non-specific ST-t changes When compared with ECG of 08-Mar-2022 13:54, Sinus rhythm has replaced Atrial fibrillation Vent. rate has decreased BY 67 BPM Nonspecific T wave abnormality, improved in Inferior leads T wave inversion no longer evident in Lateral leads QTC 435   ____________________ This patient meets SIRS Criteria and may be septic.    The recent clinical data is shown below. Vitals:   03/14/22 1825 03/14/22 1826 03/14/22 1902 03/14/22 1906  BP: (!) 179/89  (!) 172/74 (!) 162/72  Pulse:   85   Resp: 20  19   Temp: 99.2 F (37.3 C) 99.2 F (37.3 C) 98.7 F (37.1 C)   TempSrc:  Oral Oral    SpO2: 95%  98%     WBC     Component Value Date/Time   WBC 12.2 (H) 03/14/2022 1205   LYMPHSABS 1.2 03/14/2022 1205   MONOABS 0.8 03/14/2022 1205   EOSABS 0.1 03/14/2022 1205   BASOSABS 0.1 03/14/2022 1205       UA  ordered     Results for  orders placed or performed during the hospital encounter of 03/08/22  Culture, blood (single)     Status: None   Collection Time: 03/08/22  3:47 PM   Specimen: Right Antecubital; Blood  Result Value Ref Range Status   Specimen Description   Final    RIGHT ANTECUBITAL BLOOD Performed at [facility] [address] [place], [place] [address]    Special Requests   Final    BOTTLES DRAWN AEROBIC AND ANAEROBIC Blood Culture adequate volume Performed at [facility], [address], [place], [place] [address]    Culture   Final    NO GROWTH 5 DAYS Performed at [facility], [place] [address] [place], [place] [address]    Report Status 03/13/2022 FINAL  Final  Surgical PCR screen     Status: None   Collection Time: 03/11/22  4:15 AM   Specimen: Nasal Mucosa; Nasal Swab  Result Value Ref Range Status   MRSA, PCR NEGATIVE NEGATIVE Final   Staphylococcus aureus NEGATIVE NEGATIVE Final    Comment: (NOTE) The Xpert SA Assay (FDA approved for NASAL specimens in patients 52 years of age and older), is one component of a comprehensive surveillance program. It is not intended to diagnose infection nor to guide or monitor treatment. Performed at [facility], [place] [address], [place], [place] [address]      _______________________________________________ Hospitalist was called for admission for    Pleural effusion on right    The following Work up has been ordered so far:  Orders Placed This Encounter  Procedures   DG Chest 2 View   CBC with Differential   Comprehensive metabolic panel   Brain natriuretic peptide   Magnesium   TSH   T3   T4, free   Cardiac monitoring   Consult to hospitalist    Pulse oximetry, continuous   ED EKG   EKG 12-Lead   Place in observation (patient's expected length of stay will be less than 2 midnights)     OTHER Significant initial  Findings:  labs showing:    Recent Labs  Lab 03/09/22 0826 03/10/22 0117 03/11/22 0056 03/12/22 0106 03/13/22 0101 03/14/22 1205  NA 139 137 139 138 139 138  K 4.1 3.8 4.3 4.7 4.5 4.0  CO2 15* 16* 20* 20* 22 24  GLUCOSE 97 110* 104* 155* 106* 98  BUN 41* 36* 30* 26* 26* 24*  CREATININE 2.27* 2.03* 1.93* 1.76* 1.67* 1.46*  CALCIUM 9.0 8.4* 8.1* 8.2* 8.7* 8.9  MG 2.1 1.8  --  1.4* 2.1 1.6*  PHOS 4.4 3.7  --   --   --   --     Cr   stable,  baseline see below Lab Results  Component Value Date   CREATININE 1.46 (H) 03/14/2022   CREATININE 1.67 (H) 03/13/2022   CREATININE 1.76 (H) 03/12/2022    Recent Labs  Lab 03/09/22 0826 03/10/22 0117 03/12/22 0106 03/13/22 0101 03/14/22 1205  AST '21 21 23 27 21  '$ ALT 35 '28 26 28 18  '$ ALKPHOS 277* 225* 143* 170* 134*  BILITOT 0.8 0.7 0.6 0.6 0.7  PROT 7.0 6.1* 5.5* 6.7 6.8  ALBUMIN 3.1* 2.7* 2.4* 3.1* 3.6   Lab Results  Component Value Date   CALCIUM 8.9 03/14/2022   PHOS 3.7 03/10/2022    Plt: Lab Results  Component Value Date   PLT 350 03/14/2022    COVID-19 Labs  No results for input(s): "DDIMER", "FERRITIN", "LDH", "CRP"  in the last 72 hours.  Lab Results  Component Value Date   SARSCOV2NAA NOT DETECTED 10/29/2018   [place] NEGATIVE 10/28/2018      Recent Labs  Lab 03/09/22 0826 03/10/22 0117 03/11/22 0056 03/12/22 0106 03/13/22 0101 03/14/22 1205  WBC 11.2* 9.8 9.2 16.7* 16.6* 12.2*  NEUTROABS 8.9* 7.1  --  15.4* 13.9* 9.7*  HGB 11.2* 9.8* 9.0* 8.3* 10.0* 9.2*  HCT 34.2* 30.3* 27.8* 26.6* 30.7* 29.7*  MCV 85.9 87.6 86.6 90.2 87.0 88.7  PLT 334 296 266 250 377 350    HG/HCT   stable,       Component Value Date/Time   HGB 9.2 (L) 03/14/2022 1205   HCT 29.7 (L) 03/14/2022 1205   MCV 88.7 03/14/2022 1205    BNP (last  3 results) Recent Labs    03/12/22 0106 03/13/22 0101 03/14/22 1205  BNP 435.5* 423.1* 371.7*    DM  labs:  HbA1C: No results for input(s): "HGBA1C" in the last 8760 hours.    CBG (last 3)  No results for input(s): "GLUCAP" in the last 72 hours.        Cultures:    Component Value Date/Time   SDES  03/08/2022 1547    RIGHT ANTECUBITAL BLOOD Performed at [facility], [place] [address], [place], [place] [address]    [facility]  03/08/2022 1547    BOTTLES DRAWN AEROBIC AND ANAEROBIC Blood Culture adequate volume Performed at [facility], [address] [place], [place] [address]    CULT  03/08/2022 1547    NO GROWTH 5 DAYS Performed at [facility], [place] [address] [place], [place] [address]    REPTSTATUS 03/13/2022 FINAL 03/08/2022 1547     Radiological Exams on Admission: DG Chest 2 View  Result Date: 03/14/2022 CLINICAL DATA:  Shortness of breath. EXAM: CHEST - 2 VIEW COMPARISON:  March 11, 2022. FINDINGS: Stable cardiomegaly. Left lung is clear. Moderate right pleural effusion is noted with underlying atelectasis or infiltrate. The visualized skeletal structures are unremarkable. IMPRESSION: Grossly stable moderate right pleural effusion is noted with underlying atelectasis or infiltrate. Electronically Signed   By: Marijo Conception M.D.   On: 03/14/2022 12:26   _______________________________________________________________________________________________________ Latest  Blood pressure (!) 162/72, pulse 85, temperature 98.7 F (37.1 C), temperature source Oral, resp. rate 19, SpO2 98 %.   Vitals  labs and radiology finding personally reviewed  Review of Systems:    Pertinent positives include:  shortness of breath at rest.  dyspnea on exertion Constitutional:  No weight loss, night sweats, Fevers, chills, fatigue, weight loss  HEENT:  No headaches, Difficulty swallowing,Tooth/dental problems,Sore throat,  No sneezing, itching,  ear ache, nasal congestion, post nasal drip,  Cardio-vascular:  No chest pain, Orthopnea, PND, anasarca, dizziness, palpitations.no Bilateral lower extremity swelling  GI:  No heartburn, indigestion, abdominal pain, nausea, vomiting, diarrhea, change in bowel habits, loss of appetite, melena, blood in stool, hematemesis Resp:    No , No excess mucus, no productive cough, No non-productive cough, No coughing up of blood.No change in color of mucus.No wheezing. Skin:  no rash or lesions. No jaundice GU:  no dysuria, change in color of urine, no urgency or frequency. No straining to urinate.  No flank pain.  Musculoskeletal:  No joint pain or no joint swelling. No decreased range of motion. No back pain.  Psych:  No change in mood or affect. No depression or anxiety. No memory loss.  Neuro: no localizing neurological complaints,  no tingling, no weakness, no double vision, no gait abnormality, no slurred speech, no confusion  All systems reviewed and apart from Portal all are negative _______________________________________________________________________________________________ Past Medical History:   Past Medical History:  Diagnosis Date   Anemia of chronic disease    Chronic diarrhea    Glaucoma    HTN (hypertension)    Hypertension    Hypothyroid    Obesity    Skin cancer     Past Surgical History:  Procedure Laterality Date   CHOLECYSTECTOMY N/A 03/11/2022   Procedure: LAPAROSCOPIC CHOLECYSTECTOMY;  Surgeon: Dwan Bolt, MD;  Location: [facility];  Service: General;  Laterality: N/A;   GASTRIC BYPASS     GASTRIC RESTRICTION SURGERY     IR SINUS/FIST TUBE CHK-NON GI  03/09/2022   TUBAL LIGATION      Social History:  Ambulatory   independently     reports that she has never smoked. She has never used smokeless tobacco. She reports that she does not drink alcohol and does not use drugs.   Family History:  Family History  Problem Relation Age of Onset   Hypertension  Mother    Thyroid disease Mother    Stroke Mother    Kidney disease Father    Diabetes Father    ______________________________________________________________________________________________ Allergies: Allergies  Allergen Reactions   Amlodipine Swelling and Other (See Comments)    Lowered BP to much, lethargic    Coreg [Carvedilol]    Penicillin G Sodium Other (See Comments)    GI upset      Prior to Admission medications   Medication Sig Start Date End Date Taking? Authorizing Provider  Acetaminophen (TYLENOL PO) Take 2 tablets by mouth 2 (two) times daily as needed (pain).   Yes [provider]  apixaban (ELIQUIS) 5 MG TABS tablet Take 1 tablet (5 mg total) by mouth 2 (two) times daily. 03/13/22  Yes Thurnell Lose, MD  gabapentin (NEURONTIN) 100 MG capsule Take 100 mg by mouth at bedtime as needed (pain). 02/23/20  Yes [provider]  hydrALAZINE (APRESOLINE) 25 MG tablet Take 25 mg by mouth 3 (three) times daily.   Yes [provider]  metoprolol tartrate (LOPRESSOR) 100 MG tablet Take 100 mg by mouth 2 (two) times daily. 08/17/13  Yes [provider]  oxyCODONE (OXY IR/ROXICODONE) 5 MG immediate release tablet Take 1 tablet (5 mg total) by mouth every 6 (six) hours as needed for up to 3 days for severe pain. 03/12/22 03/15/22 Yes Winferd Humphrey, PA-C  Thiamine HCl (VITAMIN B-1 PO) Take 1 tablet by mouth daily.   Yes [provider]  benzonatate (TESSALON) 200 MG capsule Take 200 mg by mouth as needed for cough. Patient not taking: Reported on 03/14/2022 02/18/22   [provider]  TIROSINT 125 MCG CAPS Take 0.8 capsules (100 mcg total) by mouth daily. 03/16/22   Thurnell Lose, MD    ___________________________________________________________________________________________________ Physical Exam:    03/14/2022    7:06 PM 03/14/2022    7:02 PM 03/14/2022    6:25 PM  Vitals with BMI  Systolic 371 062 694   Diastolic 72 74 89  Pulse  85      1. General:  in No  Acute distress   Chronically ill   -appearing 2. Psychological: Alert and   Oriented 3. Head/ENT:   Moist  Mucous Membranes  Head Non traumatic, neck supple                           Poor Dentition 4. SKIN: decreased Skin turgor,  Skin clean Dry and intact no rash 5. Heart: Regular rate and rhythm no  Murmur, no Rub or gallop 6. Lungs:  diminished on the right , high-pitched audible wheeze noted Which appears to be related to upper airway  7. Abdomen: Soft,  non-tender, Non distended   obese   8. Lower extremities: no clubbing, cyanosis, no  edema 9. Neurologically Grossly intact, moving all 4 extremities equally  10. MSK: Normal range of motion    Chart has been reviewed  ______________________________________________________________________________________________  Assessment/Plan  76 y.o. 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 Continue to gently diurese. Recent echogram showed no evidence of CHF but patient does appear to have evidence of fluid overload No cough or fever to suggest underlying infectious process  Obesity Chronic stable need to have outpatient follow-up nutrition  OSA (obstructive sleep apnea) Not on CPAP  Hypothyroidism Currently has been hyperparathyroid Hold Synthroid for tonight May need to restart at a lower dose  Benign essential hypertension Restart metoprolol at 100 mg p.o. twice daily  and hydralazine 25 mg p.o. 3 times a day  Anemia of chronic disease Chronic stable check anemia panel  New onset atrial fibrillation ([place]) Recent continue metoprolol. Held Eliquis for need for thoracentesis tomorrow morning resume when able  CKD (chronic kidney disease) stage 3, GFR 30-59 ml/min (HCC)  -chronic avoid nephrotoxic medications such as NSAIDs, Vanco Zosyn combo,  avoid hypotension, continue to follow renal function  High-pitched airway wheeze -if does not resolve patient would benefit from further evaluation by ENT to see if there is any vocal cord dysfunction   Other plan as per orders.  DVT prophylaxis:  SCD      Code Status:    Code Status: Prior FULL CODE  as per patient   I had personally discussed CODE STATUS with patient     Family Communication:   Family at  Bedside  plan of care was discussed with  Daughter   Disposition Plan:     To home once workup is complete and patient is stable   Following barriers for discharge:                            Electrolytes corrected                           Pleural effusion adressed                        Would benefit from PT/OT eval prior to DC  Ordered  Nutrition    consulted              Consults called: IR  Admission status:  ED Disposition     ED Disposition  Admit   Condition  --   [place]: [facility] [[id number]]  Level of Care: Med-Surg [16]  Interfacility transfer: Yes  May place patient in observation at [facility] or [facility] if equivalent level of care is available:: Yes  Covid Evaluation: Asymptomatic - no recent exposure (last 10 days) testing not required  Diagnosis: Pleural effusion on right [[id number]]  Admitting Physician: Dwyane Dee [id number]  Attending Physician: Dwyane Dee [phone]          Obs    Need for IV diuretics    Level of care     medical floor    Toy Baker 03/14/2022, 11:43 PM     [facility]     after 2 AM please page floor coverage PA If 7AM-7PM, please contact the day team taking care of the patient using [email or web address]   Patient was evaluated in the context of the global COVID-19 pandemic, which necessitated consideration that the patient might be at risk for infection with the SARS-CoV-2 virus that causes COVID-19. Institutional protocols and algorithms that pertain to the evaluation of patients at risk for COVID-19 are in a state of rapid change based on information released by regulatory bodies including the CDC and federal and state organizations. These policies and algorithms were followed during the patient's care.

## 2022-03-14 NOTE — ED Provider Notes (Signed)
[facility]ERGENC[facility] DEPT Provider Note   CSN: [id number] Arrival date & time: 03/14/22  1132     History  Chief Complaint  Patient presents with   Shortness of Breath    Lynn Joseph is a 76 y.o. female.  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 Patient states that she was just discharged from the hospital after being admitted for new onset atrial fibrillation. Patient initiated Elliquis last night and today for same. She also had a laparoscopic cholecystectomy during her admission and has a drain in place from same. This drain was placed initially in [place] when she was initially found to have cholelithiasis. During patients admission she was also stopped on her diuretics due to several soft blood pressures and a significant 15 lb weight loss. This was found to be attributed to new onset hyperthyroidism which was thought to be related to her taking too much levothyroxine for her chronic hypothyroidism. She was also found to have an AKI during her admission and was given fluids. Patient states that she has had worsening shortness of breath throughout her admission which has worsened further since her discharge yesterday. She states that last night she was unable to sleep lying flat due to dyspnea. She also states that she has already regained the 15 lbs that she had lost over the past few days. She states that she is having dyspnea on exertion as well and that her legs and abdomen are more swollen than usual.  Of note, patient was found to have a right sided pleural effusion on imaging during her admission. Daughter states that the patient was complaining that she was short of breath prior to discharge and does not feel this was appropriately addressed prior to her discharge.  The history is provided by the patient. No language interpreter was  used.  Shortness of Breath      Home Medications Prior to Admission medications   Medication Sig Start Date End Date Taking? Authorizing Provider  Acetaminophen (TYLENOL PO) Take 2 tablets by mouth 2 (two) times daily as needed (pain).   Yes [provider]  apixaban (ELIQUIS) 5 MG TABS tablet Take 1 tablet (5 mg total) by mouth 2 (two) times daily. 03/13/22  Yes Thurnell Lose, MD  gabapentin (NEURONTIN) 100 MG capsule Take 100 mg by mouth at bedtime as needed (pain). 02/23/20  Yes [provider]  hydrALAZINE (APRESOLINE) 25 MG tablet Take 25 mg by mouth 3 (three) times daily.   Yes [provider]  metoprolol tartrate (LOPRESSOR) 100 MG tablet Take 100 mg by mouth 2 (two) times daily. 08/17/13  Yes [provider]  oxyCODONE (OXY IR/ROXICODONE) 5 MG immediate release tablet Take 1 tablet (5 mg total) by mouth every 6 (six) hours as needed for up to 3 days for severe pain. 03/12/22 03/15/22 Yes Winferd Humphrey, PA-C  Thiamine HCl (VITAMIN B-1 PO) Take 1 tablet by mouth daily.   Yes [provider]  benzonatate (TESSALON) 200 MG capsule Take 200 mg by mouth as needed for cough. Patient not taking: Reported on 03/14/2022 02/18/22   [provider]  TIROSINT 125 MCG CAPS Take 0.8 capsules (100 mcg total) by mouth daily. 03/16/22   Thurnell Lose, MD      Allergies    Amlodipine, Coreg [carvedilol], and Penicillin g sodium    Review of Systems   Review  of Systems  Respiratory:  Positive for shortness of breath.   All other systems reviewed and are negative.   Physical Exam Updated Vital Signs BP (!) 160/64   Pulse 65   Temp 98.5 F (36.9 C) (Oral)   Resp 18   SpO2 96%  Physical Exam Vitals and nursing note reviewed.  Constitutional:      General: She is not in acute distress.    Appearance: Normal appearance. She is normal weight. She is not ill-appearing, toxic-appearing or diaphoretic.  HENT:     Head:  Normocephalic and atraumatic.  Cardiovascular:     Rate and Rhythm: Normal rate and regular rhythm.     Pulses: Normal pulses.     Heart sounds: Normal heart sounds.  Pulmonary:     Effort: Pulmonary effort is normal. No respiratory distress.  Abdominal:     Comments: Well-healing laparoscopic surgical scars located throughout the abdomen.  Drain in place which is capped and entry site is noninfectious appearing.  Musculoskeletal:        General: Normal range of motion.     Cervical back: Normal range of motion.     Comments: Trace pitting edema present in bilateral lower extremities. No unilateral calf swelling or tenderness.  Skin:    General: Skin is warm and dry.  Neurological:     General: No focal deficit present.     Mental Status: She is alert.  Psychiatric:        Mood and Affect: Mood normal.        Behavior: Behavior normal.     ED Results / Procedures / Treatments   Labs (all labs ordered are listed, but only abnormal results are displayed) Labs Reviewed  CBC WITH DIFFERENTIAL/PLATELET - Abnormal; Notable for the following components:      Result Value   WBC 12.2 (*)    RBC 3.35 (*)    Hemoglobin 9.2 (*)    HCT 29.7 (*)    Neutro Abs 9.7 (*)    Abs Immature Granulocytes 0.27 (*)    All other components within normal limits  COMPREHENSIVE METABOLIC PANEL - Abnormal; Notable for the following components:   BUN 24 (*)    Creatinine, Ser 1.46 (*)    Alkaline Phosphatase 134 (*)    GFR, Estimated 37 (*)    All other components within normal limits  BRAIN NATRIURETIC PEPTIDE - Abnormal; Notable for the following components:   B Natriuretic Peptide 371.7 (*)    All other components within normal limits  MAGNESIUM - Abnormal; Notable for the following components:   Magnesium 1.6 (*)    All other components within normal limits  TSH - Abnormal; Notable for the following components:   TSH 0.328 (*)    All other components within normal limits  T3  T4, FREE     EKG EKG Interpretation  Date/Time:  Wednesday March 14 2022 11:41:07 EST Ventricular Rate:  74 PR Interval:  150 QRS Duration: 82 QT Interval:  392 QTC Calculation: 435 R Axis:   34 Text Interpretation: Normal sinus rhythm Non-specific ST-t changes When compared with ECG of 08-Mar-2022 13:54, Sinus rhythm has replaced Atrial fibrillation Vent. rate has decreased BY  67 BPM Nonspecific T wave abnormality, improved in Inferior leads T wave inversion no longer evident in Lateral leads Confirmed by Pattricia Boss [phone]) on 03/14/2022 11:43:36 AM  Radiology DG Chest 2 View  Result Date: 03/14/2022 CLINICAL DATA:  Shortness of breath. EXAM: CHEST - 2 VIEW COMPARISON:  March 11, 2022. FINDINGS: Stable cardiomegaly. Left lung is clear. Moderate right pleural effusion is noted with underlying atelectasis or infiltrate. The visualized skeletal structures are unremarkable. IMPRESSION: Grossly stable moderate right pleural effusion is noted with underlying atelectasis or infiltrate. Electronically Signed   By: Marijo Conception M.D.   On: 03/14/2022 12:26    Procedures Procedures    Medications Ordered in ED Medications  furosemide (LASIX) injection 20 mg (20 mg Intravenous Given 03/14/22 1308)    ED Course/ Medical Decision Making/ A&P                           Medical Decision Making Amount and/or Complexity of Data Reviewed Labs: ordered. Radiology: ordered.  Risk Prescription drug management.   This patient is a 76 y.o. female who presents to the ED for concern of shortness of breath, this involves an extensive number of treatment options, and is a complaint that carries with it a high risk of complications and morbidity. The emergent differential diagnosis prior to evaluation includes, but is not limited to,  CHF, pericardial effusion/tamponade, arrhythmias, ACS, COPD, asthma, bronchitis, pneumonia, pneumothorax, PE, anemia   This is not an exhaustive differential.    Past Medical History / Co-morbidities / Social History: Hx CHF, afib on Elliquis, hypothyroidism, recent cholecystectomy  Physical Exam: Physical exam performed. The pertinent findings include: Trace pitting edema bilaterally  Lab Tests: I ordered, and personally interpreted labs.  The pertinent results include:  WBC 12.2 improved from yesterday. Hgb 9.2 down from 10 yesterday. Creatinine 1.46 improved from 1.67 yesterday. BNP 371, improved from 423 yesterday. Magnesium 1.6, down from 2.1 yesterday. TSH 0.328 up from 0.127 two days ago.  No other acute laboratory findings   Imaging Studies: I ordered imaging studies including CXR. I independently visualized and interpreted imaging which showed   Grossly stable moderate right pleural effusion is noted with underlying atelectasis or infiltrate.  I agree with the radiologist interpretation.   Cardiac Monitoring:  The patient was maintained on a cardiac monitor.  My attending physician Dr. Jeanell Sparrow viewed and interpreted the cardiac monitored which showed an underlying rhythm of: no STEMI, sinus rhythm. I agree with this interpretation.   Medications: I ordered medication including Lasix  for diuresis. Reevaluation of the patient after these medicines showed that the patient improved. I have reviewed the patients home medicines and have made adjustments as needed.    Disposition:  Patient presents today with complaints of persistent shortness of breath since she was discharged yesterday.  She is afebrile, nontoxic-appearing, and in no acute distress with reassuring vital signs.  Chest x-ray does show a persistent moderate-sized right pleural effusion.  She does show signs of fluid overload as well on exam.  She is mildly tachypneic but is not hypoxic or tachycardic.  Patient's daughter states that she is having substantially worse dyspnea on exertion and she is unable to lie flat at night.  Given the symptoms, I suspect patient will have cyst  substantial symptomatic improvement with a thoracentesis to drain her pleural effusion.  I have also given her 1 dose of IV Lasix for diuresis which she states has improved her symptoms.  Patient will need admission for thoracentesis.  Will likely also need a new diuresis regimen for her heart failure given that she is not currently on any diuretics.  Patient and her daughter present at bedside are understanding and amenable with plan.  Discussed patient with hospitalist who agrees  to admit.   This is a shared visit with supervising physician Dr. Jeanell Sparrow who has independently evaluated patient & provided guidance in evaluation/management/disposition, in agreement with care    Final Clinical Impression(s) / ED Diagnoses Final diagnoses:  Shortness of breath  Pleural effusion on right    Rx / DC Orders ED Discharge Orders     None         Nestor Lewandowsky 03/14/22 1822

## 2022-03-14 NOTE — Assessment & Plan Note (Signed)
Restart metoprolol at 100 mg p.o. twice daily and hydralazine 25 mg p.o. 3 times a day

## 2022-03-14 NOTE — Subjective & Objective (Signed)
Was just discharged from [facility] yesterday after undergoing cholecystectomy noted to have atrial fibrillation when she went home she started to feel short of breath worse when she lays down flat patient does has known history of CHF as well as obesity Started on Eliquis last night patient has drain in place During her hospitalization her diuretics were stopped she was hypotensive she also found to have new hypothyroidism secondary to taking large dose of levothyroxine she was given fluids for her AKI and then started worsening shortness of breath persisted for her admission and gotten a little worse after discharge.  She is not able to lay down flat to sleep flat.  She has regained 15 pounds back over the past 2 days she noticed that her legs and abdomen is swollen.  She has chronic right-sided pleural effusion

## 2022-03-14 NOTE — Plan of Care (Signed)
Plan of Care Note for accepted transfer   Patient: Lynn Joseph MRN: [id number]   DOA: 03/14/2022  Facility requesting transfer: [facility] Requesting Provider: Lavonna Rua, PA-C Reason for transfer: right pleural effusion Facility course:  [name] is a 76 yo female with PMH new onset AFIB, recent CBD obstruction s/p perc chole tube then lap CCY on 03/11/22. Admitted 11/16 - 11/21. Discharged with perc drain in place. Was also started on eliquis for afib and lopressor for rate control.  She presented to [facility] with worsening dyspnea. Prior imaging revealed a known right pleural effusion; repeat CXR on 11/22 so persisting effusion on the right. Patient not hypoxic on workup and labs otherwise unremarkable from discharge.  Patient uncomfortable discharging home due to the dyspnea. Transfer accepted for right thoracentesis and fluid analysis given unilateral in nature as well.   Plan of care: The patient is accepted for admission to [facility]  unit, at [facility] [place].  Author: Dwyane Dee, MD 03/14/2022  Check [email or web address] for on-call coverage.  Nursing staff, Please call [place] number on Amion as soon as patient's arrival, so appropriate admitting provider can evaluate the pt.

## 2022-03-15 ENCOUNTER — Other Ambulatory Visit: Payer: Self-pay

## 2022-03-15 DIAGNOSIS — E039 Hypothyroidism, unspecified: Secondary | ICD-10-CM | POA: Diagnosis not present

## 2022-03-15 DIAGNOSIS — Z85828 Personal history of other malignant neoplasm of skin: Secondary | ICD-10-CM | POA: Diagnosis not present

## 2022-03-15 DIAGNOSIS — I509 Heart failure, unspecified: Secondary | ICD-10-CM | POA: Diagnosis not present

## 2022-03-15 DIAGNOSIS — I130 Hypertensive heart and chronic kidney disease with heart failure and stage 1 through stage 4 chronic kidney disease, or unspecified chronic kidney disease: Secondary | ICD-10-CM | POA: Diagnosis not present

## 2022-03-15 DIAGNOSIS — N1832 Chronic kidney disease, stage 3b: Secondary | ICD-10-CM | POA: Diagnosis not present

## 2022-03-15 DIAGNOSIS — E669 Obesity, unspecified: Secondary | ICD-10-CM | POA: Diagnosis not present

## 2022-03-15 DIAGNOSIS — Z6839 Body mass index (BMI) 39.0-39.9, adult: Secondary | ICD-10-CM | POA: Diagnosis not present

## 2022-03-15 DIAGNOSIS — Z7901 Long term (current) use of anticoagulants: Secondary | ICD-10-CM | POA: Diagnosis not present

## 2022-03-15 DIAGNOSIS — D638 Anemia in other chronic diseases classified elsewhere: Secondary | ICD-10-CM | POA: Diagnosis not present

## 2022-03-15 DIAGNOSIS — I4891 Unspecified atrial fibrillation: Secondary | ICD-10-CM | POA: Diagnosis not present

## 2022-03-15 DIAGNOSIS — J90 Pleural effusion, not elsewhere classified: Secondary | ICD-10-CM | POA: Diagnosis not present

## 2022-03-15 DIAGNOSIS — Z9884 Bariatric surgery status: Secondary | ICD-10-CM | POA: Diagnosis not present

## 2022-03-15 DIAGNOSIS — G4733 Obstructive sleep apnea (adult) (pediatric): Secondary | ICD-10-CM

## 2022-03-15 DIAGNOSIS — D631 Anemia in chronic kidney disease: Secondary | ICD-10-CM | POA: Diagnosis not present

## 2022-03-15 DIAGNOSIS — I10 Essential (primary) hypertension: Secondary | ICD-10-CM | POA: Diagnosis not present

## 2022-03-15 LAB — COMPREHENSIVE METABOLIC PANEL
ALT: 18 U/L (ref 0–44)
AST: 16 U/L (ref 15–41)
Albumin: 2.8 g/dL — ABNORMAL LOW (ref 3.5–5.0)
Alkaline Phosphatase: 119 U/L (ref 38–126)
Anion gap: 7 (ref 5–15)
BUN: 24 mg/dL — ABNORMAL HIGH (ref 8–23)
CO2: 23 mmol/L (ref 22–32)
Calcium: 8.1 mg/dL — ABNORMAL LOW (ref 8.9–10.3)
Chloride: 110 mmol/L (ref 98–111)
Creatinine, Ser: 1.64 mg/dL — ABNORMAL HIGH (ref 0.44–1.00)
GFR, Estimated: 32 mL/min — ABNORMAL LOW (ref >60)
Glucose, Bld: 83 mg/dL (ref 70–99)
Potassium: 3.8 mmol/L (ref 3.5–5.1)
Sodium: 140 mmol/L (ref 135–145)
Total Bilirubin: 0.6 mg/dL (ref 0.3–1.2)
Total Protein: 5.8 g/dL — ABNORMAL LOW (ref 6.5–8.1)

## 2022-03-15 LAB — CBC
HCT: 29 % — ABNORMAL LOW (ref 36.0–46.0)
Hemoglobin: 9.1 g/dL — ABNORMAL LOW (ref 12.0–15.0)
MCH: 27.5 pg (ref 26.0–34.0)
MCHC: 31.4 g/dL (ref 30.0–36.0)
MCV: 87.6 fL (ref 80.0–100.0)
Platelets: 280 10*3/uL (ref 150–400)
RBC: 3.31 MIL/uL — ABNORMAL LOW (ref 3.87–5.11)
RDW: 13.9 % (ref 11.5–15.5)
WBC: 9.4 10*3/uL (ref 4.0–10.5)
nRBC: 0 % (ref 0.0–0.2)

## 2022-03-15 LAB — PROCALCITONIN: Procalcitonin: 0.18 ng/mL

## 2022-03-15 LAB — MAGNESIUM: Magnesium: 1.8 mg/dL (ref 1.7–2.4)

## 2022-03-15 LAB — PHOSPHORUS: Phosphorus: 3.5 mg/dL (ref 2.5–4.6)

## 2022-03-15 MED ORDER — MAGNESIUM SULFATE 2 GM/50ML IV SOLN: 2.0000 g | Freq: Once | INTRAVENOUS | Status: DC

## 2022-03-15 MED ORDER — MAGNESIUM SULFATE 2 GM/50ML IV SOLN
2.0000 g | Freq: Once | INTRAVENOUS | Status: AC
  Administered 2022-03-15: 2 g via INTRAVENOUS
  Filled 2022-03-15: qty 50

## 2022-03-15 NOTE — Evaluation (Signed)
Physical Therapy Evaluation Patient Details Name: Lynn Joseph MRN: [id number] DOB: 1945/06/12 Today's Date: 03/15/2022  History of Present Illness  Pt is 76 yo female admitted 03/14/22 with shortness of breath found to have pleural effusion.  Pt recently had cholecystectomy.  Other hx includes HTN, hypothyroidism, HF, obesity  Clinical Impression  Pt admitted with above diagnosis.  She was able to ambulate 380' with normal gait pattern and stable O2 sats on RA.  Demonstrates good balance and strength. Pt has been ambulating in room independently. Pt is at baseline mobility. No acute PT needs.       Recommendations for follow up therapy are one component of a multi-disciplinary discharge planning process, led by the attending physician.  Recommendations may be updated based on patient status, additional functional criteria and insurance authorization.  Follow Up Recommendations No PT follow up      Assistance Recommended at Discharge PRN  Patient can return home with the following       Equipment Recommendations None recommended by PT  Recommendations for Other Services       Functional Status Assessment Patient has not had a recent decline in their functional status     Precautions / Restrictions Precautions Precautions: Fall      Mobility  Bed Mobility               General bed mobility comments: OOB in recliner upon entry    Transfers Overall transfer level: Independent Equipment used: None               General transfer comment: Able to come to stand without LOB or need for assistance.    Ambulation/Gait Ambulation/Gait assistance: Independent Gait Distance (Feet): 380 Feet Assistive device: None, IV Pole Gait Pattern/deviations: WFL(Within Functional Limits)       General Gait Details: Pt has been ambulating in her room independently.  She demonstrated 46' with normal gait pattern and steady balance. She was able to [facility] IV pole around  room  Stairs            Wheelchair Mobility    Modified Rankin (Stroke Patients Only)       Balance Overall balance assessment: Independent Sitting-balance support: No upper extremity supported Sitting balance-Leahy Scale: Normal     Standing balance support: No upper extremity supported Standing balance-Leahy Scale: Normal                               Pertinent Vitals/Pain Pain Assessment Pain Assessment: 0-10 Pain Score: 2  Pain Location: "normal aches and pains" Pain Descriptors / Indicators: Aching Pain Intervention(s): Limited activity within patient's tolerance    Home Living Family/patient expects to be discharged to:: Private residence Living Arrangements: Alone Available Help at Discharge: Family;Available PRN/intermittently Type of Home: House Home Access: Level entry       Home Layout: One level Home Equipment: Conservation officer, nature (2 wheels)      Prior Function Prior Level of Function : Independent/Modified Independent;Driving             Mobility Comments: No recent falls; walks without AD ADLs Comments: Performs all ADLs and iADLs without assistance; runs a dog rescues     Hand Dominance        Extremity/Trunk Assessment   Upper Extremity Assessment Upper Extremity Assessment: Overall WFL for tasks assessed    Lower Extremity Assessment Lower Extremity Assessment: Overall WFL for tasks assessed  Communication   Communication: No difficulties  Cognition Arousal/Alertness: Awake/alert Behavior During Therapy: WFL for tasks assessed/performed Overall Cognitive Status: Within Functional Limits for tasks assessed                                          General Comments General comments (skin integrity, edema, etc.): Pt on RA with sats 98% rest 91% ambulation    Exercises     Assessment/Plan    PT Assessment Patient does not need any further PT services  PT Problem List         PT  Treatment Interventions      PT Goals (Current goals can be found in the Care Plan section)  Acute Rehab PT Goals Patient Stated Goal: to go home PT Goal Formulation: All assessment and education complete, DC therapy    Frequency       Co-evaluation               AM-PAC PT "6 Clicks" Mobility  Outcome Measure Help needed turning from your back to your side while in a flat bed without using bedrails?: None Help needed moving from lying on your back to sitting on the side of a flat bed without using bedrails?: None Help needed moving to and from a bed to a chair (including a wheelchair)?: None Help needed standing up from a chair using your arms (e.g., wheelchair or bedside chair)?: None Help needed to walk in hospital room?: None Help needed climbing 3-5 steps with a railing? : A Little 6 Click Score: 23    End of Session   Activity Tolerance: Patient tolerated treatment well Patient left: in chair;with call bell/phone within reach Nurse Communication: Mobility status PT Visit Diagnosis: Other abnormalities of gait and mobility (R26.89)    Time: 5956-3875 PT Time Calculation (min) (ACUTE ONLY): 12 min   Charges:   PT Evaluation $PT Eval Low Complexity: 1 Low          Makana Rostad, PT Acute Rehab [facility] [phone]   Karlton Lemon 03/15/2022, 11:26 AM

## 2022-03-15 NOTE — Consult Note (Signed)
NAME:  Lynn Joseph, MRN:  [id number], DOB:  [birth year], LOS: 0 ADMISSION DATE:  03/14/2022, CONSULTATION DATE:  03/15/22 REFERRING MD:  Irine Seal, MD CHIEF COMPLAINT:  Right pleural effusion   History of Present Illness:  76 year old female with recent cholecystitis s/p biliary drain placed 11/8 in [place] and recent hospitalization for new onset atrial fibrillation and pericapsular hematoma on 11/16-11/21. During this hospitalization she underwent lap cholecystectomy on 07/62 without complications. After discharge on 11/22 she returned to the ED with complaints of shortness of breath with laying down. Reports weight gain of 15lbs and lower extremity swelling. Denies fevers, chills. CXR demonstrated right moderate effusion with atelectasis. She was given lasix and admitted to [facility]. PCCM consulted for thoracentesis and pleural fluid management.  Patient and daughter reports annual history of respiratory illness requiring treatment and possible pleural effusion in Sept 2023. Denies any history of thoracentesis. CT CAP 03/08/22 with cardiomegaly and small/moderate right pleural effusion during this effusion. Has had shortness of breath, wheezing and cough.  Denies any smoking history however lived with smoker and had wood burning stove used for heat and cooking in home for >30 years  Pertinent  Medical History  HTN, hypothyroidism, atrial fibrillation on [facility], s/p roux en y gastric bypass in 2009, CKD IIIb, ?diastolic heart failure  Significant Hospital Events: Including procedures, antibiotic start and stop dates in addition to other pertinent events   11/22 Admitted to [facility]  Interim History / Subjective:  As above  Objective   Blood pressure (!) 160/66, pulse 64, temperature 98.2 F (36.8 C), temperature source Oral, resp. rate 20, height '5\' 5"'$  (1.651 m), weight 107 kg, SpO2 96 %.        Intake/Output Summary (Last 24 hours) at 03/15/2022 1717 Last data filed at 03/15/2022  1400 Gross per 24 hour  Intake 531.06 ml  Output --  Net 531.06 ml   Filed Weights   03/14/22 2300 03/15/22 0918  Weight: 106.9 kg 107 kg   Physical Exam: General: Well-appearing, no acute distress HENT: [place], AT, OP clear, MMM Eyes: EOMI, no scleral icterus Respiratory: Diminished basilar right air entry. Normal breath sounds on left.  No crackles, wheezing or rales Cardiovascular: RRR, -M/R/G, no JVD Neuro: AAO x4, CNII-XII grossly intact Skin: Intact, no rashes or bruising Psych: Normal mood, normal affect  Resolved Hospital Problem list   N/A  Assessment & Plan:   Right pleural effusion Unclear if this new since her prior admission 11/16-11/21 or chronic, possibly since June with self-reported effusion. Could be related to cardiac however effusion is unilateral. Prior history of recurrent pneumonias may suggest hydropneumothorax but would need to confirm with response to thoracentesis. Doubt infectious in the absence of fever or significant leukocytosis. Fortunately hemodynamically stable with no O2 requirements but does have O2 drop from 100% to 93% on room air with ambulation.  --Last Eliquis 11/22 7 am per patient --Plan for thoracentesis with Pulmonary tomorrow morning after 48 hour washout. Does not need to be NPO --No supplemental O2 requirements at this time --Patient wanting to be discharged home as soon as possible. Ok to follow-up with Pulmonary as outpatient for repeat CXR and to discuss labs as long as she remains HDS post-procedure  Best Practice (right click and "Reselect all SmartList Selections" daily)   Diet/type: Regular consistency (see orders) DVT prophylaxis: other Hold eliquis in setting of pending procedure GI prophylaxis: N/A Lines: N/A Foley:  N/A Code Status:  full code Last date of multidisciplinary  goals of care discussion [11/23] Per primary team  Labs   CBC: Recent Labs  Lab 03/09/22 0826 03/10/22 0117 03/11/22 0056 03/12/22 0106  03/13/22 0101 03/14/22 1205 03/15/22 0515  WBC 11.2* 9.8 9.2 16.7* 16.6* 12.2* 9.4  NEUTROABS 8.9* 7.1  --  15.4* 13.9* 9.7*  --   HGB 11.2* 9.8* 9.0* 8.3* 10.0* 9.2* 9.1*  HCT 34.2* 30.3* 27.8* 26.6* 30.7* 29.7* 29.0*  MCV 85.9 87.6 86.6 90.2 87.0 88.7 87.6  PLT 334 296 266 250 377 350 017    Basic Metabolic Panel: Recent Labs  Lab 03/09/22 0826 03/10/22 0117 03/11/22 0056 03/12/22 0106 03/13/22 0101 03/14/22 1205 03/14/22 2141 03/15/22 0515  NA 139 137 139 138 139 138  --  140  K 4.1 3.8 4.3 4.7 4.5 4.0  --  3.8  CL 109 110 112* 108 105 104  --  110  CO2 15* 16* 20* 20* 22 24  --  23  GLUCOSE 97 110* 104* 155* 106* 98  --  83  BUN 41* 36* 30* 26* 26* 24*  --  24*  CREATININE 2.27* 2.03* 1.93* 1.76* 1.67* 1.46*  --  1.64*  CALCIUM 9.0 8.4* 8.1* 8.2* 8.7* 8.9  --  8.1*  MG 2.1 1.8  --  1.4* 2.1 1.6* 1.6* 1.8  PHOS 4.4 3.7  --   --   --   --  3.3 3.5   GFR: Estimated Creatinine Clearance: 35.5 mL/min (A) (by C-G formula based on SCr of 1.64 mg/dL (H)). Recent Labs  Lab 03/12/22 0106 03/13/22 0101 03/14/22 1205 03/14/22 2141 03/15/22 0515 03/15/22 0921  PROCALCITON  --   --   --  0.13  --  0.18  WBC 16.7* 16.6* 12.2*  --  9.4  --     Liver Function Tests: Recent Labs  Lab 03/10/22 0117 03/12/22 0106 03/13/22 0101 03/14/22 1205 03/15/22 0515  AST '21 23 27 21 16  '$ ALT '28 26 28 18 18  '$ ALKPHOS 225* 143* 170* 134* 119  BILITOT 0.7 0.6 0.6 0.7 0.6  PROT 6.1* 5.5* 6.7 6.8 5.8*  ALBUMIN 2.7* 2.4* 3.1* 3.6 2.8*   No results for input(s): "LIPASE", "AMYLASE" in the last 168 hours. No results for input(s): "AMMONIA" in the last 168 hours.  ABG    Component Value Date/Time   HCO3 21.9 10/28/2018 0714   ACIDBASEDEF 5.6 (H) 10/28/2018 0714   O2SAT 12.1 10/28/2018 0714     Coagulation Profile: Recent Labs  Lab 03/11/22 0650  INR 1.3*    Cardiac Enzymes: No results for input(s): "CKTOTAL", "CKMB", "CKMBINDEX", "TROPONINI" in the last 168  hours.  HbA1C: No results found for: "HGBA1C"  CBG: No results for input(s): "GLUCAP" in the last 168 hours.  Review of Systems:   Review of Systems  Constitutional:  Negative for chills, diaphoresis, fever, malaise/fatigue and weight loss.  HENT:  Negative for congestion.   Respiratory:  Positive for cough, shortness of breath and wheezing. Negative for hemoptysis and sputum production.   Cardiovascular:  Negative for chest pain, palpitations and leg swelling.  Gastrointestinal:  Positive for abdominal pain.     Past Medical History:  She,  has a past medical history of Anemia of chronic disease, Chronic diarrhea, Glaucoma, HTN (hypertension), Hypertension, Hypothyroid, Obesity, and Skin cancer.   Surgical History:   Past Surgical History:  Procedure Laterality Date   CHOLECYSTECTOMY N/A 03/11/2022   Procedure: LAPAROSCOPIC CHOLECYSTECTOMY;  Surgeon: Dwan Bolt, MD;  Location: [facility];  Service:  General;  Laterality: N/A;   GASTRIC BYPASS     GASTRIC RESTRICTION SURGERY     IR SINUS/FIST TUBE CHK-NON GI  03/09/2022   TUBAL LIGATION       Social History:   reports that she has never smoked. She has never used smokeless tobacco. She reports that she does not drink alcohol and does not use drugs.   Family History:  Her family history includes Diabetes in her father; Hypertension in her mother; Kidney disease in her father; Stroke in her mother; Thyroid disease in her mother.   Allergies Allergies  Allergen Reactions   Amlodipine Swelling and Other (See Comments)    Lowered BP to much, lethargic    Coreg [Carvedilol]    Penicillin G Sodium Other (See Comments)    GI upset      Home Medications  Prior to Admission medications   Medication Sig Start Date End Date Taking? Authorizing Provider  Acetaminophen (TYLENOL PO) Take 2 tablets by mouth 2 (two) times daily as needed (pain).   Yes [provider]  apixaban (ELIQUIS) 5 MG TABS tablet Take 1 tablet (5 mg  total) by mouth 2 (two) times daily. 03/13/22  Yes Thurnell Lose, MD  gabapentin (NEURONTIN) 100 MG capsule Take 100 mg by mouth at bedtime as needed (pain). 02/23/20  Yes [provider]  hydrALAZINE (APRESOLINE) 25 MG tablet Take 25 mg by mouth 3 (three) times daily.   Yes [provider]  metoprolol tartrate (LOPRESSOR) 100 MG tablet Take 100 mg by mouth 2 (two) times daily. 08/17/13  Yes [provider]  oxyCODONE (OXY IR/ROXICODONE) 5 MG immediate release tablet Take 1 tablet (5 mg total) by mouth every 6 (six) hours as needed for up to 3 days for severe pain. 03/12/22 03/15/22 Yes Winferd Humphrey, PA-C  Thiamine HCl (VITAMIN B-1 PO) Take 1 tablet by mouth daily.   Yes [provider]  benzonatate (TESSALON) 200 MG capsule Take 200 mg by mouth as needed for cough. Patient not taking: Reported on 03/14/2022 02/18/22   [provider]  TIROSINT 125 MCG CAPS Take 0.8 capsules (100 mcg total) by mouth daily. 03/16/22   Thurnell Lose, MD     Critical care time: N/A    Care Time: 31 min  Rodman Pickle, M.D. [facility] Pulmonary/Critical Care Medicine 03/15/2022 5:17 PM   Please see Amion for pager number to reach on-call Pulmonary and Critical Care Team.

## 2022-03-15 NOTE — Progress Notes (Signed)
SATURATION QUALIFICATIONS: (This note is used to comply with regulatory documentation for home oxygen)  Patient Saturations on Room Air at Rest = 100%  Patient Saturations on Room Air while Ambulating = 93%

## 2022-03-15 NOTE — Progress Notes (Signed)
PROGRESS NOTE    Lynn Joseph  RFF:[id number] DOB: 07/12/1945 DOA: 03/14/2022 PCP: Velna Hatchet, MD    Chief Complaint  Patient presents with   Shortness of Breath    Brief Narrative:  Patient pleasant 76 year old female history of hypertension, hypothyroidism presented to the ED with worsening shortness of breath after being discharged 1 day prior to admission after undergoing cholecystectomy status post biliary drain in place, noted during her hospitalization regarding to A-fib with RVR requiring Cardizem drip subsequently transitioned from Cardizem drip to oral beta-blocker and back in sinus rhythm.  Patient seen by general surgery and cardiology and cleared for discharge on the day of discharge.  Patient also noted to go into AKI which resolved after hydration. Patient presented back to the ED with worsening shortness of breath with some orthopnea.  Chest x-ray done with moderate right-sided pleural effusion that had been stable.  Patient also noted to have expiratory high-pitched wheezes on admission.  On admission due to concerns for volume overload patient also admitted received a dose of IV Lasix.  Ultrasound-guided thoracentesis ordered and pending.   Assessment & Plan:   Principal Problem:   Pleural effusion on right Active Problems:   Obesity   OSA (obstructive sleep apnea)   Hypothyroidism   Benign essential hypertension   Anemia of chronic disease   New onset atrial fibrillation (HCC)   CKD (chronic kidney disease) stage 3, GFR 30-59 ml/min (HCC)  #1 moderate right-sided pleural effusion -Initially noted on chest x-ray on 03/11/2022 during last hospitalization. -IR consulted for ultrasound-guided thoracentesis for fluid samples, and pleural studies which is currently pending. -Patient noted to have received a dose of IV Lasix in the ED. -Recent 2D echo done with no evidence of CHF however per admitting physician patient had evidence of fluid overload on  examination which has since improved. -Patient currently afebrile, no fever.  Leukocytosis has trended down. -Consult with pulmonary for further evaluation and management.  2.  OSA -Not on CPAP. -Will need outpatient evaluation for sleep apnea.  3.  New onset A-fib with RVR- CHA2DS2VASC score >3 -2D echo done during recent hospitalization with a EF of 55 to 60%, no wall motion abnormalities. -Patient during last hospitalization initially required a diltiazem drip, currently on oral beta-blocker and currently in sinus rhythm. -Patient was cleared on discharge to be started on Eliquis which is currently on hold in anticipation of thoracentesis. -Outpatient follow-up with cardiology.  4.  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 -Will place patient back on decreased dose of Synthroid 100 mcg daily starting tomorrow 03/16/2022.  #5.  Hypertension -Currently controlled on current regimen of hydralazine and Lopressor.  6.  Normocytic anemia -H&H stable.  7.  CKD stage IIIb -Baseline creatinine noted approximately 1.7. -Currently at baseline.  8.  Obesity status post Roux-en-Y gastric bypass -BMI 37.79 kg/meters squared. -Continue lifestyle modification. -Outpatient follow-up with PCP.  9.  Recent common bile duct obstruction status post percutaneous cholecystostomy tube -Drain last hospitalization patient underwent laparoscopic cholecystectomy on 03/11/2022, tolerated procedure well, was on antibiotics preoperatively which were discontinued postoperatively per surgical recommendations.  Patient with right upper quadrant percutaneous biliary drain which will be followed up in the outpatient setting per IR. -Outpatient follow-up with general  surgery.  10.  Hypomagnesemia -Magnesium  noted at 1.6 on admission repleted currently at 1.8. -Magnesium sulfate 2 g IV x 1 goal to keep magnesium approximately at 2.   DVT prophylaxis: On hold pending thoracentesis. Code Status: Full Family Communication: Updated patient and daughter at bedside. Disposition: Likely home when clinically improved and thoracentesis completed hopefully in the next 24 hours.  Status is: Observation The patient remains OBS appropriate and will d/c before 2 midnights.   Consultants:  None  Procedures:  Chest x-ray 03/14/2022  Antimicrobials:  None   Subjective: Patient laying in bed.  Daughter at bedside.  Patient states shortness of breath has improved since admission.  Denies any chest pain or abdominal pain.  Noted to have ambulated in the hallway with RN.  Objective: Vitals:   03/15/22 0639 03/15/22 0827 03/15/22 0918 03/15/22 1157  BP: (!) 167/69 (!) 163/58  127/62  Pulse: 66 64  64  Resp: 20   20  Temp: 98.1 F (36.7 C)   98.2 F (36.8 C)  TempSrc: Oral   Oral  SpO2: 94%   96%  Weight:   107 kg   Height:        Intake/Output Summary (Last 24 hours) at 03/15/2022 1304 Last data filed at 03/15/2022 0314 Gross per 24 hour  Intake 51.06 ml  Output 625 ml  Net -573.94 ml   Filed Weights   03/14/22 2300 03/15/22 0918  Weight: 106.9 kg 107 kg    Examination:  General exam: Appears calm and comfortable  Respiratory system: Decreased breath sounds in the right base.  No crackles.  Fair air movement.  Speaking in full sentences.  No use of accessory muscles of respiration. Cardiovascular system: S1 & S2 heard, RRR. No JVD, murmurs, rubs, gallops or clicks.  Trace bilateral lower extremity edema.  Gastrointestinal system: Abdomen is nondistended, soft and nontender. No organomegaly or masses felt. Normal bowel sounds heard. Central nervous system: Alert and oriented. No focal neurological deficits. Extremities: Symmetric 5 x 5  power. Skin: No rashes, lesions or ulcers Psychiatry: Judgement and insight appear normal. Mood & affect appropriate.     Data Reviewed: I have personally reviewed following labs and imaging studies  CBC: Recent Labs  Lab 03/09/22 0826 03/10/22 0117 03/11/22 0056 03/12/22 0106 03/13/22 0101 03/14/22 1205 03/15/22 0515  WBC 11.2* 9.8 9.2 16.7* 16.6* 12.2* 9.4  NEUTROABS 8.9* 7.1  --  15.4* 13.9* 9.7*  --   HGB 11.2* 9.8* 9.0* 8.3* 10.0* 9.2* 9.1*  HCT 34.2* 30.3* 27.8* 26.6* 30.7* 29.7* 29.0*  MCV 85.9 87.6 86.6 90.2 87.0 88.7 87.6  PLT 334 296 266 250 377 350 854    Basic Metabolic Panel: Recent Labs  Lab 03/09/22 0826 03/10/22 0117 03/11/22 0056 03/12/22 0106 03/13/22 0101 03/14/22 1205 03/14/22 2141 03/15/22 0515  NA 139 137 139 138 139 138  --  140  K 4.1 3.8 4.3 4.7 4.5 4.0  --  3.8  CL 109 110 112* 108 105 104  --  110  CO2 15* 16* 20* 20* 22 24  --  23  GLUCOSE 97 110* 104* 155* 106* 98  --  83  BUN 41* 36* 30* 26* 26* 24*  --  24*  CREATININE 2.27* 2.03* 1.93* 1.76* 1.67* 1.46*  --  1.64*  CALCIUM 9.0 8.4* 8.1* 8.2* 8.7* 8.9  --  8.1*  MG 2.1 1.8  --  1.4* 2.1 1.6* 1.6* 1.8  PHOS 4.4 3.7  --   --   --   --  3.3 3.5    GFR: Estimated Creatinine Clearance: 35.5 mL/min (A) (by C-G formula based on SCr of 1.64 mg/dL (H)).  Liver Function Tests: Recent Labs  Lab 03/10/22 0117 03/12/22 0106 03/13/22 0101 03/14/22 1205 03/15/22 0515  AST '21 23 27 21 16  '$ ALT '28 26 28 18 18  '$ ALKPHOS 225* 143* 170* 134* 119  BILITOT 0.7 0.6 0.6 0.7 0.6  PROT 6.1* 5.5* 6.7 6.8 5.8*  ALBUMIN 2.7* 2.4* 3.1* 3.6 2.8*    CBG: No results for input(s): "GLUCAP" in the last 168 hours.   Recent Results (from the past 240 hour(s))  Culture, blood (single)     Status: None   Collection Time: 03/08/22  3:47 PM   Specimen: Right Antecubital; Blood  Result Value Ref Range Status   Specimen Description   Final    RIGHT ANTECUBITAL BLOOD Performed at [facility] [address] [place], [place] [address]    Special Requests   Final    BOTTLES DRAWN AEROBIC AND ANAEROBIC Blood Culture adequate volume Performed at [facility], [address] [place], [place] [address]    Culture   Final    NO GROWTH 5 DAYS Performed at [facility], [place] [address] [place], [place] [address]    Report Status 03/13/2022 FINAL  Final  Surgical PCR screen     Status: None   Collection Time: 03/11/22  4:15 AM   Specimen: Nasal Mucosa; Nasal Swab  Result Value Ref Range Status   MRSA, PCR NEGATIVE NEGATIVE Final   Staphylococcus aureus NEGATIVE NEGATIVE Final    Comment: (NOTE) The Xpert SA Assay (FDA approved for NASAL specimens in patients 69 years of age and older), is one component of a comprehensive surveillance program. It is not intended to diagnose infection nor to guide or monitor treatment. Performed at [facility], [place] [address], [place], [place] [address]          Radiology Studies: DG Chest 2 View  Result Date: 03/14/2022 CLINICAL DATA:  Shortness of breath. EXAM: CHEST - 2 VIEW COMPARISON:  March 11, 2022. FINDINGS: Stable cardiomegaly. Left lung is clear. Moderate right pleural effusion is noted with underlying atelectasis or infiltrate. The visualized skeletal structures are unremarkable. IMPRESSION: Grossly stable moderate right pleural effusion is noted with underlying atelectasis or infiltrate. Electronically Signed   By: Marijo Conception M.D.   On: 03/14/2022 12:26        Scheduled Meds:  hydrALAZINE  25 mg Oral TID   metoprolol tartrate  100 mg Oral BID   sodium chloride flush  3 mL Intravenous Q12H   Continuous Infusions:  sodium chloride       LOS: 0 days    Time spent: 40 minutes    Irine Seal, MD [facility]   To contact the attending provider between 7A-7P or the covering provider during after hours 7P-7A, please log into the web site [email or web address] and access  using universal [place] password for that web site. If you do not have the password, please call the hospital operator.  03/15/2022, 1:04 PM

## 2022-03-15 NOTE — Progress Notes (Signed)
Initial Nutrition Assessment  DOCUMENTATION CODES:   Obesity unspecified  INTERVENTION:  - No nutrition intervention warranted at this time.   NUTRITION DIAGNOSIS:    (No nutrition diagnosis at this time) related to   as evidenced by  .  MONITOR:   PO intake  REASON FOR ASSESSMENT:   Consult Assessment of nutrition requirement/status  ASSESSMENT:   76 y.o. female admits related to SOB. PMH includes: HTN, hypothyroidism, HF, obesity s/p roux en y gastric bypass. Pt is currently receiving medical management for pleural effusion on right.  Meds reviewed. Labs reviewed.   Per record, pt ate 75% of her lunch today. Wts stable per record. Pt also ate well at previous admission to [facility]. Pt likely meeting her needs at this time. RD will continue to monitor PO intakes.   NUTRITION - FOCUSED PHYSICAL EXAM:  RD unable to assess, will attempt at follow up.   Diet Order:   Diet Order             Diet Heart Room service appropriate? Yes; Fluid consistency: Thin  Diet effective now                   EDUCATION NEEDS:      Skin:  Skin Assessment: Skin Integrity Issues: Skin Integrity Issues:: Incisions Incisions: abdomen  Last BM:  03/15/22  Height:   Ht Readings from Last 1 Encounters:  03/14/22 '5\' 5"'$  (1.651 m)    Weight:   Wt Readings from Last 1 Encounters:  03/15/22 107 kg    Ideal Body Weight:     BMI:  Body mass index is 39.24 kg/m.  Estimated Nutritional Needs:   Kcal:  6553-7482 kcals  Protein:  105-125 gm  Fluid:  >/= 2.1 L  Thalia Bloodgood, RD, LDN, CNSC.

## 2022-03-15 NOTE — Progress Notes (Signed)
OT Cancellation Note  Patient Details Name: Lynn Joseph MRN: [id number] DOB: [birth year]   Cancelled Treatment:    Reason Eval/Treat Not Completed: OT screened, no needs identified, will sign off Discussed with pt at bedside, denies any concerns with ADLs/mobility and managing fine at this time. No formal OT eval needed at this time.    Layla Maw 03/15/2022, 10:26 AM

## 2022-03-16 ENCOUNTER — Observation Stay (HOSPITAL_COMMUNITY): Payer: Medicare Other

## 2022-03-16 DIAGNOSIS — E039 Hypothyroidism, unspecified: Secondary | ICD-10-CM | POA: Diagnosis not present

## 2022-03-16 DIAGNOSIS — N1832 Chronic kidney disease, stage 3b: Secondary | ICD-10-CM | POA: Diagnosis not present

## 2022-03-16 DIAGNOSIS — J90 Pleural effusion, not elsewhere classified: Secondary | ICD-10-CM | POA: Diagnosis not present

## 2022-03-16 DIAGNOSIS — R846 Abnormal cytological findings in specimens from respiratory organs and thorax: Secondary | ICD-10-CM | POA: Diagnosis not present

## 2022-03-16 DIAGNOSIS — I4891 Unspecified atrial fibrillation: Secondary | ICD-10-CM | POA: Diagnosis not present

## 2022-03-16 DIAGNOSIS — I10 Essential (primary) hypertension: Secondary | ICD-10-CM | POA: Diagnosis not present

## 2022-03-16 DIAGNOSIS — G4733 Obstructive sleep apnea (adult) (pediatric): Secondary | ICD-10-CM | POA: Diagnosis not present

## 2022-03-16 LAB — RENAL FUNCTION PANEL
Albumin: 2.9 g/dL — ABNORMAL LOW (ref 3.5–5.0)
Anion gap: 8 (ref 5–15)
BUN: 27 mg/dL — ABNORMAL HIGH (ref 8–23)
CO2: 26 mmol/L (ref 22–32)
Calcium: 8.1 mg/dL — ABNORMAL LOW (ref 8.9–10.3)
Chloride: 107 mmol/L (ref 98–111)
Creatinine, Ser: 1.58 mg/dL — ABNORMAL HIGH (ref 0.44–1.00)
GFR, Estimated: 34 mL/min — ABNORMAL LOW (ref >60)
Glucose, Bld: 90 mg/dL (ref 70–99)
Phosphorus: 3.9 mg/dL (ref 2.5–4.6)
Potassium: 4 mmol/L (ref 3.5–5.1)
Sodium: 141 mmol/L (ref 135–145)

## 2022-03-16 LAB — CBC WITH DIFFERENTIAL/PLATELET
Abs Immature Granulocytes: 0.26 10*3/uL — ABNORMAL HIGH (ref 0.00–0.07)
Basophils Absolute: 0.1 10*3/uL (ref 0.0–0.1)
Basophils Relative: 1 %
Eosinophils Absolute: 0.2 10*3/uL (ref 0.0–0.5)
Eosinophils Relative: 2 %
HCT: 27.6 % — ABNORMAL LOW (ref 36.0–46.0)
Hemoglobin: 8.6 g/dL — ABNORMAL LOW (ref 12.0–15.0)
Immature Granulocytes: 3 %
Lymphocytes Relative: 18 %
Lymphs Abs: 1.7 10*3/uL (ref 0.7–4.0)
MCH: 28.2 pg (ref 26.0–34.0)
MCHC: 31.2 g/dL (ref 30.0–36.0)
MCV: 90.5 fL (ref 80.0–100.0)
Monocytes Absolute: 0.8 10*3/uL (ref 0.1–1.0)
Monocytes Relative: 9 %
Neutro Abs: 6.3 10*3/uL (ref 1.7–7.7)
Neutrophils Relative %: 67 %
Platelets: 277 10*3/uL (ref 150–400)
RBC: 3.05 MIL/uL — ABNORMAL LOW (ref 3.87–5.11)
RDW: 13.7 % (ref 11.5–15.5)
WBC: 9.4 10*3/uL (ref 4.0–10.5)
nRBC: 0 % (ref 0.0–0.2)

## 2022-03-16 LAB — BODY FLUID CELL COUNT WITH DIFFERENTIAL
Lymphs, Fluid: 72 %
Monocyte-Macrophage-Serous Fluid: 11 % — ABNORMAL LOW (ref 50–90)
Neutrophil Count, Fluid: 17 % (ref 0–25)
Total Nucleated Cell Count, Fluid: 1990 cu mm — ABNORMAL HIGH (ref 0–1000)

## 2022-03-16 LAB — MAGNESIUM: Magnesium: 2.2 mg/dL (ref 1.7–2.4)

## 2022-03-16 LAB — LACTATE DEHYDROGENASE, PLEURAL OR PERITONEAL FLUID: LD, Fluid: 141 U/L — ABNORMAL HIGH (ref 3–23)

## 2022-03-16 LAB — PROCALCITONIN: Procalcitonin: <0.1 ng/mL

## 2022-03-16 LAB — T3: T3, Total: 51 ng/dL — ABNORMAL LOW (ref 71–180)

## 2022-03-16 LAB — PROTEIN, TOTAL: Total Protein: 6.4 g/dL — ABNORMAL LOW (ref 6.5–8.1)

## 2022-03-16 LAB — GLUCOSE, PLEURAL OR PERITONEAL FLUID: Glucose, Fluid: 125 mg/dL

## 2022-03-16 LAB — LACTATE DEHYDROGENASE
LDH: 150 U/L (ref 98–192)
LDH: 251 U/L — ABNORMAL HIGH (ref 98–192)

## 2022-03-16 LAB — PROTEIN, PLEURAL OR PERITONEAL FLUID: Total protein, fluid: 3.5 g/dL

## 2022-03-16 MED ORDER — LEVOTHYROXINE SODIUM 100 MCG PO TABS
100.0000 ug | ORAL_TABLET | Freq: Every day | ORAL | Status: DC
  Administered 2022-03-16: 100 ug via ORAL
  Filled 2022-03-16: qty 1

## 2022-03-16 MED ORDER — APIXABAN 5 MG PO TABS: 5.0000 mg | ORAL_TABLET | Freq: Two times a day (BID) | ORAL | 0 refills | Status: DC

## 2022-03-16 MED ORDER — TIROSINT 125 MCG PO CAPS: 100.0000 ug | ORAL_CAPSULE | Freq: Every day | ORAL | 0 refills | Status: DC

## 2022-03-16 MED ORDER — OXYCODONE HCL 5 MG PO TABS: 5.0000 mg | ORAL_TABLET | Freq: Four times a day (QID) | ORAL | 0 refills | Status: AC | PRN

## 2022-03-16 MED ORDER — TIROSINT 125 MCG PO CAPS: 100.0000 ug | ORAL_CAPSULE | Freq: Every day | ORAL | 1 refills | Status: DC

## 2022-03-16 NOTE — Procedures (Signed)
Thoracentesis  Procedure Note  Lynn Joseph  [id number]  01-Jun-1945  Date:03/16/22  Time:9:52 AM   Provider Performing:Vale Mousseau Rodman Pickle, MD   Procedure: Thoracentesis with imaging guidance [phone])  Indication(s) Right pleural Effusion  Consent Risks of the procedure as well as the alternatives and risks of each were explained to the patient and/or caregiver.  Consent for the procedure was obtained and is signed in the bedside chart  Anesthesia Topical only with 1% lidocaine , 8cc   Time Out Verified patient identification, verified procedure, site/side was marked, verified correct patient position, special equipment/implants available, medications/allergies/relevant history reviewed, required imaging and test results available.   Sterile Technique Maximal sterile technique including full sterile barrier drape, hand hygiene, sterile gown, sterile gloves, mask, hair covering, sterile ultrasound probe cover (if used).  Procedure Description Ultrasound was used to identify appropriate pleural anatomy for placement and overlying skin marked.  Area of drainage cleaned and draped in sterile fashion. Lidocaine was used to anesthetize the skin and subcutaneous tissue.  850 cc's of dark maroon appearing fluid was drained from the right pleural space. Catheter then removed and bandaid applied to site.   Complications/Tolerance None; patient tolerated the procedure well. Chest X-ray is ordered to confirm no post-procedural complication.   EBL Minimal   Specimen(s) Pleural fluid

## 2022-03-16 NOTE — Progress Notes (Incomplete)
PROGRESS NOTE    Lynn Joseph  NKN:[id number] DOB: 03/21/1946 DOA: 03/14/2022 PCP: Velna Hatchet, MD    Chief Complaint  Patient presents with   Shortness of Breath    Brief Narrative:  Patient pleasant 76 year old female history of hypertension, hypothyroidism presented to the ED with worsening shortness of breath after being discharged 1 day prior to admission after undergoing cholecystectomy status post biliary drain in place, noted during her hospitalization regarding to A-fib with RVR requiring Cardizem drip subsequently transitioned from Cardizem drip to oral beta-blocker and back in sinus rhythm.  Patient seen by general surgery and cardiology and cleared for discharge on the day of discharge.  Patient also noted to go into AKI which resolved after hydration. Patient presented back to the ED with worsening shortness of breath with some orthopnea.  Chest x-ray done with moderate right-sided pleural effusion that had been stable.  Patient also noted to have expiratory high-pitched wheezes on admission.  On admission due to concerns for volume overload patient also admitted received a dose of IV Lasix.  Ultrasound-guided thoracentesis ordered and pending.   Assessment & Plan:   Principal Problem:   Pleural effusion on right Active Problems:   Obesity   OSA (obstructive sleep apnea)   Hypothyroidism   Benign essential hypertension   Anemia of chronic disease   New onset atrial fibrillation (HCC)   CKD (chronic kidney disease) stage 3, GFR 30-59 ml/min (HCC)  #1 moderate right-sided pleural effusion -Initially noted on chest x-ray on 03/11/2022 during last hospitalization. -IR consulted for ultrasound-guided thoracentesis for fluid samples, and pleural studies which is currently pending. -Patient noted to have received a dose of IV Lasix in the ED. -Recent 2D echo done with no evidence of CHF however per admitting physician patient had evidence of fluid overload on  examination which has since improved. -Patient currently afebrile, no fever.  Leukocytosis has trended down. -Consult with pulmonary for further evaluation and management.  2.  OSA -Not on CPAP. -Will need outpatient evaluation for sleep apnea.  3.  New onset A-fib with RVR- CHA2DS2VASC score >3 -2D echo done during recent hospitalization with a EF of 55 to 60%, no wall motion abnormalities. -Patient during last hospitalization initially required a diltiazem drip, currently on oral beta-blocker and currently in sinus rhythm. -Patient was cleared on discharge to be started on Eliquis which is currently on hold in anticipation of thoracentesis. -Outpatient follow-up with cardiology.  4.  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 -Will place patient back on decreased dose of Synthroid 100 mcg daily starting tomorrow 03/16/2022.  #5.  Hypertension -Currently controlled on current regimen of hydralazine and Lopressor.  6.  Normocytic anemia -H&H stable.  7.  CKD stage IIIb -Baseline creatinine noted approximately 1.7. -Currently at baseline.  8.  Obesity status post Roux-en-Y gastric bypass -BMI 37.79 kg/meters squared. -Continue lifestyle modification. -Outpatient follow-up with PCP.  9.  Recent common bile duct obstruction status post percutaneous cholecystostomy tube -Drain last hospitalization patient underwent laparoscopic cholecystectomy on 03/11/2022, tolerated procedure well, was on antibiotics preoperatively which were discontinued postoperatively per surgical recommendations.  Patient with right upper quadrant percutaneous biliary drain which will be followed up in the outpatient setting per IR. -Outpatient follow-up with general  surgery.  10.  Hypomagnesemia -Magnesium  noted at 1.6 on admission repleted currently at 1.8. -Magnesium sulfate 2 g IV x 1 goal to keep magnesium approximately at 2.   DVT prophylaxis: On hold pending thoracentesis. Code Status: Full Family Communication: Updated patient and daughter at bedside. Disposition: Likely home when clinically improved and thoracentesis completed hopefully in the next 24 hours.  Status is: Observation The patient remains OBS appropriate and will d/c before 2 midnights.   Consultants:  None  Procedures:  Chest x-ray 03/14/2022  Antimicrobials:  None   Subjective: Patient laying in bed.  Daughter at bedside.  Patient states shortness of breath has improved since admission.  Denies any chest pain or abdominal pain.  Noted to have ambulated in the hallway with RN.  Objective: Vitals:   03/15/22 2158 03/16/22 0404 03/16/22 0852 03/16/22 0900  BP:  (!) 156/54 (!) 158/62   Pulse:  (!) 58  72  Resp:  18 18   Temp:  97.9 F (36.6 C) 98.2 F (36.8 C)   TempSrc:  Oral Oral   SpO2: (!) 83% 98% 97%   Weight:      Height:        Intake/Output Summary (Last 24 hours) at 03/16/2022 1001 Last data filed at 03/15/2022 1400 Gross per 24 hour  Intake 240 ml  Output --  Net 240 ml    Filed Weights   03/14/22 2300 03/15/22 0918  Weight: 106.9 kg 107 kg    Examination:  General exam: Appears calm and comfortable  Respiratory system: Decreased breath sounds in the right base.  No crackles.  Fair air movement.  Speaking in full sentences.  No use of accessory muscles of respiration. Cardiovascular system: S1 & S2 heard, RRR. No JVD, murmurs, rubs, gallops or clicks.  Trace bilateral lower extremity edema.  Gastrointestinal system: Abdomen is nondistended, soft and nontender. No organomegaly or masses felt. Normal bowel sounds heard. Central nervous system: Alert and oriented. No focal neurological deficits. Extremities: Symmetric 5 x 5 power. Skin: No  rashes, lesions or ulcers Psychiatry: Judgement and insight appear normal. Mood & affect appropriate.     Data Reviewed: I have personally reviewed following labs and imaging studies  CBC: Recent Labs  Lab 03/10/22 0117 03/11/22 0056 03/12/22 0106 03/13/22 0101 03/14/22 1205 03/15/22 0515 03/16/22 0440  WBC 9.8   < > 16.7* 16.6* 12.2* 9.4 9.4  NEUTROABS 7.1  --  15.4* 13.9* 9.7*  --  6.3  HGB 9.8*   < > 8.3* 10.0* 9.2* 9.1* 8.6*  HCT 30.3*   < > 26.6* 30.7* 29.7* 29.0* 27.6*  MCV 87.6   < > 90.2 87.0 88.7 87.6 90.5  PLT 296   < > 250 377 350 280 277   < > = values in this interval not displayed.     Basic Metabolic Panel: Recent Labs  Lab 03/10/22 0117 03/11/22 0056 03/12/22 0106 03/13/22 0101 03/14/22 1205 03/14/22 2141 03/15/22 0515 03/16/22 0440  NA 137   < > 138 139 138  --  140 141  K 3.8   < > 4.7 4.5 4.0  --  3.8 4.0  CL 110   < > 108 105 104  --  110 107  CO2 16*   < > 20* 22 24  --  23 26  GLUCOSE 110*   < > 155* 106* 98  --  83 90  BUN 36*   < > 26* 26* 24*  --  24* 27*  CREATININE 2.03*   < >  1.76* 1.67* 1.46*  --  1.64* 1.58*  CALCIUM 8.4*   < > 8.2* 8.7* 8.9  --  8.1* 8.1*  MG 1.8  --  1.4* 2.1 1.6* 1.6* 1.8 2.2  PHOS 3.7  --   --   --   --  3.3 3.5 3.9   < > = values in this interval not displayed.     GFR: Estimated Creatinine Clearance: 36.8 mL/min (A) (by C-G formula based on SCr of 1.58 mg/dL (H)).  Liver Function Tests: Recent Labs  Lab 03/10/22 0117 03/12/22 0106 03/13/22 0101 03/14/22 1205 03/15/22 0515 03/16/22 0440  AST '21 23 27 21 16  '$ --   ALT '28 26 28 18 18  '$ --   ALKPHOS 225* 143* 170* 134* 119  --   BILITOT 0.7 0.6 0.6 0.7 0.6  --   PROT 6.1* 5.5* 6.7 6.8 5.8*  --   ALBUMIN 2.7* 2.4* 3.1* 3.6 2.8* 2.9*     CBG: No results for input(s): "GLUCAP" in the last 168 hours.   Recent Results (from the past 240 hour(s))  Culture, blood (single)     Status: None   Collection Time: 03/08/22  3:47 PM   Specimen: Right  Antecubital; Blood  Result Value Ref Range Status   Specimen Description   Final    RIGHT ANTECUBITAL BLOOD Performed at [facility] [address] [place], [place] [address]    Special Requests   Final    BOTTLES DRAWN AEROBIC AND ANAEROBIC Blood Culture adequate volume Performed at [facility], [address], [place], [place] [address]    Culture   Final    NO GROWTH 5 DAYS Performed at [facility], [place] [address], [place], [place] [address]    Report Status 03/13/2022 FINAL  Final  Surgical PCR screen     Status: None   Collection Time: 03/11/22  4:15 AM   Specimen: Nasal Mucosa; Nasal Swab  Result Value Ref Range Status   MRSA, PCR NEGATIVE NEGATIVE Final   Staphylococcus aureus NEGATIVE NEGATIVE Final    Comment: (NOTE) The Xpert SA Assay (FDA approved for NASAL specimens in patients 20 years of age and older), is one component of a comprehensive surveillance program. It is not intended to diagnose infection nor to guide or monitor treatment. Performed at [facility], [place] [address], [place], [place] [address]          Radiology Studies: DG Chest 2 View  Result Date: 03/14/2022 CLINICAL DATA:  Shortness of breath. EXAM: CHEST - 2 VIEW COMPARISON:  March 11, 2022. FINDINGS: Stable cardiomegaly. Left lung is clear. Moderate right pleural effusion is noted with underlying atelectasis or infiltrate. The visualized skeletal structures are unremarkable. IMPRESSION: Grossly stable moderate right pleural effusion is noted with underlying atelectasis or infiltrate. Electronically Signed   By: Marijo Conception M.D.   On: 03/14/2022 12:26        Scheduled Meds:  hydrALAZINE  25 mg Oral TID   levothyroxine  100 mcg Oral QAC breakfast   metoprolol tartrate  100 mg Oral BID   sodium chloride flush  3 mL Intravenous Q12H   Continuous Infusions:  sodium chloride       LOS: 0 days    Time spent: 40 minutes    Irine Seal, MD [facility]   To contact the attending provider between 7A-7P or the covering provider during after hours 7P-7A, please log into the web site  [email or web address] and access using universal [place] password for that web site. If you do not have the password, please call the hospital operator.  03/16/2022, 10:01 AM

## 2022-03-16 NOTE — TOC Progression Note (Signed)
Transition of Care [facility]) - Progression Note    Patient Details  Name: Lynn Joseph MRN: [id number] Date of Birth: 1945/06/11  [facility]) CM/SW Contact  Henrietta Dine, RN Phone Number: 03/16/2022, 11:58 AM  Clinical Narrative:     Transition of Care [facility]) Screening Note   Patient Details  Name: Lynn Joseph Date of Birth: [birth year]   Transition [facility]) CM/SW Contact:    Henrietta Dine, RN Phone Number: 03/16/2022, 11:58 AM    Transition of Care Department [facility]) has reviewed patient and no TOC needs have been identified at this time. We will continue to monitor patient advancement through interdisciplinary progression rounds. If new patient transition needs arise, please place a TOC consult.          Expected Discharge Plan and Services                                                 Social Determinants of Health (SDOH) Interventions    Readmission Risk Interventions     No data to display

## 2022-03-16 NOTE — Discharge Summary (Signed)
Physician Discharge Summary  Lynn Joseph OEV:[id number] DOB: 09/23/1945 DOA: 03/14/2022  PCP: Velna Hatchet, MD  Admit date: 03/14/2022 Discharge date: 03/16/2022  Time spent: 60 minutes  Recommendations for Outpatient Follow-up:  Follow-up with Dr. Loanne Drilling, pulmonary as scheduled on 03/19/2022 for follow-up on thoracentesis and results and further management. Follow-up with Velna Hatchet, MD in 2 weeks.  On follow-up patient will need a basic metabolic profile done to follow-up on electrolytes and renal function.  Patient need a CBC done.  Patient may benefit for referral for sleep study in the outpatient setting.   Discharge Diagnoses:  Principal Problem:   Pleural effusion on right Active Problems:   Obesity   OSA (obstructive sleep apnea)   Hypothyroidism   Benign essential hypertension   Anemia of chronic disease   New onset atrial fibrillation (HCC)   CKD (chronic kidney disease) stage 3, GFR 30-59 ml/min (HCC)   Discharge Condition: Stable and improved  Diet recommendation: Heart healthy  Filed Weights   03/14/22 2300 03/15/22 0918  Weight: 106.9 kg 107 kg    History of present illness:  HPI per Dr. Tiana Loft is a 76 y.o. 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    Presented with   worsening Shortness of breath Was just discharged from [facility] yesterday after undergoing cholecystectomy noted to have atrial fibrillation when she went home she started to feel short of breath worse when she lays down flat patient does has known history of CHF as well as obesity Started on Eliquis last night patient has drain in place During her hospitalization her diuretics were stopped she was hypotensive she also found to have new hypothyroidism secondary to taking large dose of levothyroxine she was given fluids for her AKI and then started worsening shortness of breath persisted for  her admission and gotten a little worse after discharge.  She is not able to lay down flat to sleep flat.  She has regained 15 pounds back over the past 2 days she noticed that her legs and abdomen is swollen.  She has chronic right-sided pleural effusion Prior to her discharge patient had had an echogram which was fairly unremarkable with preserved EF   Has a bad reaction to coreg but does tolerate metoprolol She does not smoke or drink Denies any fevers or chest pain although does have some right upper quadrant pain which she describes as her lungs hurting.  Not worse with inspiration. Patient has persistent expiratory high-pitched wheeze which seems to concentrated in upper airway and not audible on lung auscultation.  Hospital Course:  #1 moderate right-sided pleural effusion -Initially noted on chest x-ray on 03/11/2022 during last hospitalization. -IR consulted for ultrasound-guided thoracentesis for fluid samples, and pleural studies initially on presentation. -Patient noted to have received a dose of IV Lasix in the ED with clinical improvement with dyspnea.. -Recent 2D echo done with no evidence of CHF however per admitting physician patient had evidence of fluid overload on examination which has since improved. -Patient remained afebrile, leukocytosis trended down.   -Pulmonary consulted patient seen in consultation by Dr. Loanne Drilling and patient subsequently underwent thoracentesis 03/16/2022 with 850 cc of dark maroon appearing fluid drained from right pleural space without any complications.   -Concern for hydropneumothorax as repeat chest x-ray postthoracentesis with small to moderate right pleural effusion/reaccumulation with associated atelectasis/airspace disease, no pneumothorax noted.  -Patient cleared by pulmonary with close outpatient follow-up scheduled  for 03/19/2022.    2.  OSA -Not on CPAP. -Will need outpatient evaluation for sleep apnea.   3.  New onset A-fib with RVR-  CHA2DS2VASC score >3 -2D echo done during recent hospitalization with a EF of 55 to 60%, no wall motion abnormalities. -Patient during last hospitalization initially required a diltiazem drip, currently on oral beta-blocker and remain in sinus rhythm throughout the hospitalization. -Patient was cleared on discharge to be started on Eliquis 1 day post discharge. -Outpatient follow-up with cardiology.   4.  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 -Patient placed back on decreased dose of Synthroid 100 mcg daily as noted on last discharge.   -Outpatient follow-up with PCP.    #5.  Hypertension -Controlled on home regimen of hydralazine and Lopressor.   -Outpatient follow-up.    6.  Normocytic anemia -H&H stable.   7.  CKD stage IIIb -Baseline creatinine noted approximately 1.7. -Renal function remained stable and creatinine was 1.58 by day of discharge.     8.  Obesity status post Roux-en-Y gastric bypass -BMI 37.79 kg/meters squared. -Continue lifestyle modification. -Outpatient follow-up with PCP.   9.  Recent common bile duct obstruction status post percutaneous cholecystostomy tube -Drain last hospitalization patient underwent laparoscopic cholecystectomy on 03/11/2022, tolerated procedure well, was on antibiotics preoperatively which were discontinued postoperatively per surgical recommendations.  Patient with right upper quadrant percutaneous biliary drain which will be followed up in the outpatient setting per IR. -Outpatient follow-up with general surgery.   10.  Hypomagnesemia -Magnesium noted at 1.6 on admission repleted during the hospitalization such that by day of discharge magnesium was up to 2.2.   -Outpatient follow-up.        Procedures: Chest x-ray 03/14/2022, 03/16/2022 Thoracentesis with imaging guidance per PCCM, Dr. Loanne Drilling 03/16/2022  Consultations: PCCM: Dr. Loanne Drilling 03/15/2022  Discharge Exam: Vitals:   03/16/22 0852 03/16/22 0900  BP: (!) 158/62   Pulse:  72  Resp: 18   Temp: 98.2 F (36.8 C)   SpO2: 97%     General: NAD Cardiovascular: Regular rate rhythm no murmurs rubs or gallops.  No JVD.  No lower extremity edema. Respiratory: Improved decreased right basilar breath sounds.  No rhonchi, no crackles, fair air movement.  Speaking in full sentences.  Discharge Instructions   Discharge Instructions     Diet - low sodium heart healthy   Complete by: As directed    Increase activity slowly   Complete by: As directed    No wound care   Complete by: As directed       Allergies as of 03/16/2022       Reactions   Amlodipine Swelling, Other (See Comments)   Lowered BP to much, lethargic    Coreg [carvedilol]    Penicillin G Sodium Other (See Comments)   GI upset         Medication List     STOP taking these medications    benzonatate 200 MG capsule Commonly known as: TESSALON       TAKE these medications    apixaban 5 MG Tabs tablet Commonly known as: ELIQUIS Take 1 tablet (5 mg total) by mouth 2 (two) times daily. Start taking on: March 17, 2022   gabapentin 100 MG capsule Commonly known as: NEURONTIN Take  100 mg by mouth at bedtime as needed (pain).   hydrALAZINE 25 MG tablet Commonly known as: APRESOLINE Take 25 mg by mouth 3 (three) times daily.   metoprolol tartrate 100 MG tablet Commonly known as: LOPRESSOR Take 100 mg by mouth 2 (two) times daily.   oxyCODONE 5 MG immediate release tablet Commonly known as: Oxy IR/ROXICODONE Take 1 tablet (5 mg total) by mouth every 6 (six) hours as needed for up to 3 days for severe pain.   Tirosint 125 MCG Caps Generic drug: Levothyroxine Sodium Take 0.8 capsules (100 mcg total) by mouth daily.    TYLENOL PO Take 2 tablets by mouth 2 (two) times daily as needed (pain).   VITAMIN B-1 PO Take 1 tablet by mouth daily.       Allergies  Allergen Reactions   Amlodipine Swelling and Other (See Comments)    Lowered BP to much, lethargic    Coreg [Carvedilol]    Penicillin G Sodium Other (See Comments)    GI upset     Follow-up Information     Velna Hatchet, MD. Schedule an appointment as soon as possible for a visit in 2 week(s).   Specialty: Internal Medicine Contact information: [place] [address] [phone]         Dr Loanne Drilling, Pulmonary Follow up on 03/19/2022.   Why: Follow-up as scheduled at 2:45 PM.                 The results of significant diagnostics from this hospitalization (including imaging, microbiology, ancillary and laboratory) are listed below for reference.    Significant Diagnostic Studies: DG CHEST PORT 1 VIEW  Result Date: 03/16/2022 CLINICAL DATA:  The patient is status post a right thoracentesis. EXAM: PORTABLE CHEST 1 VIEW COMPARISON:  Chest radiograph dated 03/14/2022. FINDINGS: The heart remains mildly enlarged. Vascular calcifications are seen in the aortic arch. There is elevation of the right hemidiaphragm relative to the left. There is a small to moderate right pleural effusion with associated atelectasis/airspace disease. The left lung is clear and there is no left pleural effusion. There is no pneumothorax on either side. Degenerative changes are seen in the spine. IMPRESSION: 1. Small to moderate right pleural effusion with associated atelectasis/airspace disease. No pneumothorax. Electronically Signed   By: Zerita Boers M.D.   On: 03/16/2022 10:06   DG Chest 2 View  Result Date: 03/14/2022 CLINICAL DATA:  Shortness of breath. EXAM: CHEST - 2 VIEW COMPARISON:  March 11, 2022. FINDINGS: Stable cardiomegaly. Left lung is clear. Moderate right pleural effusion is noted with underlying atelectasis or  infiltrate. The visualized skeletal structures are unremarkable. IMPRESSION: Grossly stable moderate right pleural effusion is noted with underlying atelectasis or infiltrate. Electronically Signed   By: Marijo Conception M.D.   On: 03/14/2022 12:26   DG Chest Port 1 View  Result Date: 03/11/2022 CLINICAL DATA:  [id number] with shortness of breath and atrial fibrillation. EXAM: PORTABLE CHEST 1 VIEW COMPARISON:  Chest CT without contrast 03/08/2022 FINDINGS: 6:57 a.m. The heart is enlarged. Central vascular prominence is again noted. There is no overt edema. Moderately elevated right hemidiaphragm again is seen and an overlying small or moderate sized right pleural effusion. Right lower lung field is largely obscured, previously demonstrating atelectasis or consolidation in the lower lobe alongside the effusion. The remaining lungs are clear. The overall aeration seems unchanged. Aortic tortuosity and calcification are again noted.  Osteopenia. IMPRESSION: 1. Cardiomegaly with central vascular prominence. No overt edema.  2. Small to moderate sized right pleural effusion. 3. Right lower lung field is obscured, on CT demonstrating atelectasis or consolidation in the lower lobe alongside the effusion. No new abnormality is suspected. Electronically Signed   By: Telford Nab M.D.   On: 03/11/2022 07:10   ECHOCARDIOGRAM COMPLETE  Result Date: 03/09/2022    ECHOCARDIOGRAM REPORT   Patient Name:   GOLDY CALANDRA Date of Exam: 03/09/2022 Medical Rec #:  [id number]         Height:       65.0 in Accession #:    [id number]        Weight:       227.1 lb Date of Birth:  04/20/1946         BSA:          2.088 m Patient Age:    20 years          BP:           146/71 mmHg Patient Gender: F                 HR:           105 bpm. Exam Location:  Inpatient Procedure: 2D Echo, Cardiac Doppler and Color Doppler Indications:    Atrial fibrillation  History:        Patient has prior history of Echocardiogram examinations, most                  recent 10/28/2018. CHF; Risk Factors:Hypertension.  Sonographer:    Clayton Lefort RDCS (AE) Referring Phys: Karmen Bongo IMPRESSIONS  1. Left ventricular ejection fraction, by estimation, is 55 to 60%. The left ventricle has normal function. The left ventricle has no regional wall motion abnormalities. Left ventricular diastolic function could not be evaluated.  2. Right ventricular systolic function is normal. The right ventricular size is normal. There is normal pulmonary artery systolic pressure. The estimated right ventricular systolic pressure is 37.3 mmHg.  3. The mitral valve is grossly normal. Trivial mitral valve regurgitation. No evidence of mitral stenosis.  4. The aortic valve is tricuspid. There is mild calcification of the aortic valve. Aortic valve regurgitation is not visualized. Aortic valve sclerosis is present, with no evidence of aortic valve stenosis.  5. The inferior vena cava is normal in size with greater than 50% respiratory variability, suggesting right atrial pressure of 3 mmHg. Comparison(s): Changes from prior study are noted. EF unchanged. Patient now in Afib. FINDINGS  Left Ventricle: Left ventricular ejection fraction, by estimation, is 55 to 60%. The left ventricle has normal function. The left ventricle has no regional wall motion abnormalities. The left ventricular internal cavity size was normal in size. There is  no left ventricular hypertrophy. Left ventricular diastolic function could not be evaluated due to atrial fibrillation. Left ventricular diastolic function could not be evaluated. Right Ventricle: The right ventricular size is normal. No increase in right ventricular wall thickness. Right ventricular systolic function is normal. There is normal pulmonary artery systolic pressure. The tricuspid regurgitant velocity is 1.79 m/s, and  with an assumed right atrial pressure of 3 mmHg, the estimated right ventricular systolic pressure is 42.8 mmHg. Left Atrium: Left  atrial size was normal in size. Right Atrium: Right atrial size was normal in size. Pericardium: There is no evidence of pericardial effusion. Presence of epicardial fat layer. Mitral Valve: The mitral valve is grossly normal. Trivial mitral valve regurgitation. No evidence of mitral valve stenosis. Tricuspid Valve: The tricuspid  valve is grossly normal. Tricuspid valve regurgitation is trivial. No evidence of tricuspid stenosis. Aortic Valve: The aortic valve is tricuspid. There is mild calcification of the aortic valve. Aortic valve regurgitation is not visualized. Aortic valve sclerosis is present, with no evidence of aortic valve stenosis. Aortic valve mean gradient measures 4.0 mmHg. Aortic valve peak gradient measures 6.4 mmHg. Aortic valve area, by VTI measures 2.59 cm. Pulmonic Valve: The pulmonic valve was grossly normal. Pulmonic valve regurgitation is trivial. No evidence of pulmonic stenosis. Aorta: The aortic root and ascending aorta are structurally normal, with no evidence of dilitation. Venous: The inferior vena cava is normal in size with greater than 50% respiratory variability, suggesting right atrial pressure of 3 mmHg. IAS/Shunts: The atrial septum is grossly normal.  LEFT VENTRICLE PLAX 2D LVIDd:         4.40 cm LVIDs:         2.70 cm LV PW:         1.20 cm LV IVS:        1.10 cm LVOT diam:     2.00 cm LV SV:         51 LV SV Index:   25 LVOT Area:     3.14 cm  RIGHT VENTRICLE             IVC RV Basal diam:  3.10 cm     IVC diam: 1.60 cm RV S prime:     12.90 cm/s TAPSE (M-mode): 1.8 cm LEFT ATRIUM           Index        RIGHT ATRIUM           Index LA diam:      4.20 cm 2.01 cm/m   RA Area:     16.30 cm LA Vol (A2C): 55.5 ml 26.58 ml/m  RA Volume:   42.50 ml  20.36 ml/m LA Vol (A4C): 61.0 ml 29.22 ml/m  AORTIC VALVE AV Area (Vmax):    2.49 cm AV Area (Vmean):   2.35 cm AV Area (VTI):     2.59 cm AV Vmax:           126.00 cm/s AV Vmean:          91.300 cm/s AV VTI:            0.198 m  AV Peak Grad:      6.4 mmHg AV Mean Grad:      4.0 mmHg LVOT Vmax:         100.00 cm/s LVOT Vmean:        68.300 cm/s LVOT VTI:          0.163 m LVOT/AV VTI ratio: 0.82  AORTA Ao Root diam: 3.20 cm Ao Asc diam:  3.70 cm TRICUSPID VALVE TR Peak grad:   12.8 mmHg TR Vmax:        179.00 cm/s  SHUNTS Systemic VTI:  0.16 m Systemic Diam: 2.00 cm Eleonore Chiquito MD Electronically signed by Eleonore Chiquito MD Signature Date/Time: 03/09/2022/5:19:26 PM    Final    IR Sinus/Fist Tube Chk-Non GI  Result Date: 03/09/2022 INDICATION: 76 year old female who presents with a biliary drainage catheter placed at an outside institution. EXAM: SINUS TRACT INJECTION/FISTULOGRAM MEDICATIONS: None. ANESTHESIA/SEDATION: None. FLUOROSCOPY TIME:  Radiation exposure index: 2 mGy reference air kerma COMPLICATIONS: None immediate. PROCEDURE: Informed written consent was obtained from the patient after a thorough discussion of the procedural risks, benefits and alternatives. All questions were addressed. Maximal  Sterile Barrier Technique was utilized including caps, mask, sterile gowns, sterile gloves, sterile drape, hand hygiene and skin antiseptic. A timeout was performed prior to the initiation of the procedure. A gentle hand injection of contrast material was performed. This is an 41 [place] internal/external biliary drainage catheter. The drainage catheter is patent. No evidence of biliary ductal dilatation. Contrast material passes freely into the ampulla. Additionally, contrast material refluxes through the patent cystic duct and partially opacifies the collapsed gallbladder. IMPRESSION: 1. Eight [place] internal/external biliary drainage catheter is well-positioned and normally functioning. 2. Patent cystic duct. The internal/external biliary drainage catheter was capped. Electronically Signed   By: Jacqulynn Cadet M.D.   On: 03/09/2022 13:07   CT CHEST ABDOMEN PELVIS WO CONTRAST  Result Date: 03/08/2022 CLINICAL DATA:  Right upper  quadrant pain, recent per chole tube. EXAM: CT CHEST, ABDOMEN AND PELVIS WITHOUT CONTRAST TECHNIQUE: Multidetector CT imaging of the chest, abdomen and pelvis was performed following the standard protocol without IV contrast. RADIATION DOSE REDUCTION: This exam was performed according to the departmental dose-optimization program which includes automated exposure control, adjustment of the mA and/or kV according to patient size and/or use of iterative reconstruction technique. COMPARISON:  None Available. FINDINGS: CT CHEST FINDINGS Cardiovascular: Normal heart size. No pericardial effusion. Normal caliber thoracic aorta with mild calcified plaque. No visible coronary artery calcifications. Mediastinum/Nodes: Small hiatal hernia. No pathologically enlarged lymph nodes seen in the chest Lungs/Pleura: Central airways are patent small right pleural effusion and right basilar atelectasis. No consolidation, pleural effusion or pneumothorax Musculoskeletal: No chest wall mass or suspicious bone lesions identified. CT ABDOMEN PELVIS FINDINGS Hepatobiliary: Small subcapsular collection located adjacent to the percutaneous biliary drain measuring 5.3 x 1.3 cm. Decompressed gallbladder with gallstones evidence of biliary ductal dilation. Common bile duct drain in place. No evidence of biliary ductal dilation. Pancreas: Unremarkable. No pancreatic ductal dilatation or surrounding inflammatory changes. Spleen: Normal in size without focal abnormality. Adrenals/Urinary Tract: Bilateral adrenal glands are unremarkable. No hydronephrosis or nephrolithiasis. Bladder is unremarkable. Stomach/Bowel: Postsurgical changes of the stomach. No bowel wall thickening, inflammatory change or evidence of obstruction. Normal appendix Vascular/Lymphatic: Aortic atherosclerosis. No enlarged abdominal or pelvic lymph nodes. Reproductive: Uterus and bilateral adnexa are unremarkable. Other: Trace high-density free fluid seen in the pelvis.  Musculoskeletal: No acute or significant osseous findings. IMPRESSION: 1. Small subcapsular collection located adjacent to the percutaneous biliary drain, likely postprocedural hematoma or biloma. HIDA scan could assist with further evaluation. 2. Small right pleural effusion and right basilar atelectasis. 3. Trace high-density free fluid seen in the pelvis, likely due to small volume hemoperitoneum related to recent procedure. 4. Aortic Atherosclerosis (ICD10-I70.0). Electronically Signed   By: Yetta Glassman M.D.   On: 03/08/2022 15:48    Microbiology: Recent Results (from the past 240 hour(s))  Culture, blood (single)     Status: None   Collection Time: 03/08/22  3:47 PM   Specimen: Right Antecubital; Blood  Result Value Ref Range Status   Specimen Description   Final    RIGHT ANTECUBITAL BLOOD Performed at [facility], [address] [place], [place] [address]    Special Requests   Final    BOTTLES DRAWN AEROBIC AND ANAEROBIC Blood Culture adequate volume Performed at [facility], [address] [place], [place] [address]    Culture   Final    NO GROWTH 5 DAYS Performed at [facility], [place] [address], [place],  [address]    Report Status 03/13/2022  FINAL  Final  Surgical PCR screen     Status: None   Collection Time: 03/11/22  4:15 AM   Specimen: Nasal Mucosa; Nasal Swab  Result Value Ref Range Status   MRSA, PCR NEGATIVE NEGATIVE Final   Staphylococcus aureus NEGATIVE NEGATIVE Final    Comment: (NOTE) The Xpert SA Assay (FDA approved for NASAL specimens in patients 38 years of age and older), is one component of a comprehensive surveillance program. It is not intended to diagnose infection nor to guide or monitor treatment. Performed at [facility], [place] [address] [place], [place] [address]      Labs: Basic Metabolic Panel: Recent Labs  Lab 03/10/22 0117 03/11/22 0056 03/12/22 0106 03/13/22 0101 03/14/22 1205  03/14/22 2141 03/15/22 0515 03/16/22 0440  NA 137   < > 138 139 138  --  140 141  K 3.8   < > 4.7 4.5 4.0  --  3.8 4.0  CL 110   < > 108 105 104  --  110 107  CO2 16*   < > 20* 22 24  --  23 26  GLUCOSE 110*   < > 155* 106* 98  --  83 90  BUN 36*   < > 26* 26* 24*  --  24* 27*  CREATININE 2.03*   < > 1.76* 1.67* 1.46*  --  1.64* 1.58*  CALCIUM 8.4*   < > 8.2* 8.7* 8.9  --  8.1* 8.1*  MG 1.8  --  1.4* 2.1 1.6* 1.6* 1.8 2.2  PHOS 3.7  --   --   --   --  3.3 3.5 3.9   < > = values in this interval not displayed.   Liver Function Tests: Recent Labs  Lab 03/10/22 0117 03/12/22 0106 03/13/22 0101 03/14/22 1205 03/15/22 0515 03/16/22 0440 03/16/22 1039  AST '21 23 27 21 16  '$ --   --   ALT '28 26 28 18 18  '$ --   --   ALKPHOS 225* 143* 170* 134* 119  --   --   BILITOT 0.7 0.6 0.6 0.7 0.6  --   --   PROT 6.1* 5.5* 6.7 6.8 5.8*  --  6.4*  ALBUMIN 2.7* 2.4* 3.1* 3.6 2.8* 2.9*  --    No results for input(s): "LIPASE", "AMYLASE" in the last 168 hours. No results for input(s): "AMMONIA" in the last 168 hours. CBC: Recent Labs  Lab 03/10/22 0117 03/11/22 0056 03/12/22 0106 03/13/22 0101 03/14/22 1205 03/15/22 0515 03/16/22 0440  WBC 9.8   < > 16.7* 16.6* 12.2* 9.4 9.4  NEUTROABS 7.1  --  15.4* 13.9* 9.7*  --  6.3  HGB 9.8*   < > 8.3* 10.0* 9.2* 9.1* 8.6*  HCT 30.3*   < > 26.6* 30.7* 29.7* 29.0* 27.6*  MCV 87.6   < > 90.2 87.0 88.7 87.6 90.5  PLT 296   < > 250 377 350 280 277   < > = values in this interval not displayed.   Cardiac Enzymes: No results for input(s): "CKTOTAL", "CKMB", "CKMBINDEX", "TROPONINI" in the last 168 hours. BNP: BNP (last 3 results) Recent Labs    03/12/22 0106 03/13/22 0101 03/14/22 1205  BNP 435.5* 423.1* 371.7*    ProBNP (last 3 results) No results for input(s): "PROBNP" in the last 8760 hours.  CBG: No results for input(s): "GLUCAP" in the last 168 hours.     Signed:  Irine Seal MD.  [facility] 03/16/2022, 1:35 PM

## 2022-03-16 NOTE — Consult Note (Signed)
NAME:  ADRINNE Joseph, MRN:  [id number], DOB:  October 31, 1945, LOS: 0 ADMISSION DATE:  03/14/2022, CONSULTATION DATE:  03/15/22 REFERRING MD:  Irine Seal, MD CHIEF COMPLAINT:  Right pleural effusion   History of Present Illness:  76 year old female with recent cholecystitis s/p biliary drain placed 11/8 in [place] and recent hospitalization for new onset atrial fibrillation and pericapsular hematoma on 11/16-11/21. During this hospitalization she underwent lap cholecystectomy on 96/22 without complications. After discharge on 11/22 she returned to the ED with complaints of shortness of breath with laying down. Reports weight gain of 15lbs and lower extremity swelling. Denies fevers, chills. CXR demonstrated right moderate effusion with atelectasis. She was given lasix and admitted to [facility]. PCCM consulted for thoracentesis and pleural fluid management.  Patient and daughter reports annual history of respiratory illness requiring treatment and possible pleural effusion in Sept 2023. Denies any history of thoracentesis. CT CAP 03/08/22 with cardiomegaly and small/moderate right pleural effusion during this effusion. Has had shortness of breath, wheezing and cough.  Denies any smoking history however lived with smoker and had wood burning stove used for heat and cooking in home for >30 years  Pertinent  Medical History  HTN, hypothyroidism, atrial fibrillation on [facility], s/p roux en y gastric bypass in 2009, CKD IIIb, ?diastolic heart failure  Significant Hospital Events: Including procedures, antibiotic start and stop dates in addition to other pertinent events   11/22 Admitted to [facility] 11/24 Right thoracentesis 800 cc dark bloody fluid removed  Interim History / Subjective:  No complaints overnight. Unchanged shortness of breath  Objective   Blood pressure (!) 158/62, pulse 72, temperature 98.2 F (36.8 C), temperature source Oral, resp. rate 18, height '5\' 5"'$  (1.651 m), weight 107 kg, SpO2 97  %.        Intake/Output Summary (Last 24 hours) at 03/16/2022 0950 Last data filed at 03/15/2022 1400 Gross per 24 hour  Intake 480 ml  Output --  Net 480 ml    Filed Weights   03/14/22 2300 03/15/22 0918  Weight: 106.9 kg 107 kg    Physical Exam: General: Well-appearing, no acute distress HENT: , AT, OP clear, MMM Eyes: EOMI, no scleral icterus Respiratory: Diminished right basilar air entry. Normal left breath sounds.  No crackles, wheezing or rales Cardiovascular: RRR, -M/R/G, no JVD Neuro: AAO x4, CNII-XII grossly intact Psych: Normal mood, normal affect  Bedside ultrasound 03/16/22 - moderate right pleural effusion  Resolved Hospital Problem list   N/A  Assessment & Plan:   Right pleural effusion Unclear if this new since her prior admission 11/16-11/21 or chronic, possibly since June with self-reported effusion. Could be related to cardiac however effusion is unilateral. Prior history of recurrent pneumonias may suggest hydropneumothorax but would need to confirm with response to thoracentesis. Doubt infectious in the absence of fever or significant leukocytosis. Fortunately hemodynamically stable with no O2 requirements but does have O2 drop from 100% to 93% on room air with ambulation.  S/p thoracentesis 03/16/22 with 800 cc dark bloody fluid removed Repeat CXR post-procedure demonstrates small/moderate right pleural effusion, right hemidiaphragm elevation, no pneumothorax. Suspect this is hydropneumothorax  --Labs ordered. Pleural - cell count, culture, LDH, protein, glucose, cytology. Serum - LDH, protein --No supplemental O2 requirements at this time --Will arrange follow-up with Pulmonary Loanne Drilling) at [facility] on 11/27 at 2:45 PM with repeat CXR --Patient and daughter updated. May benefit from future thoracentesis if she is symptomatic. Not interested in surgical intervention.  Pulmonary will sign off.  Thank you for involving [name] in your patient's  care.  Best Practice (right click and "Reselect all SmartList Selections" daily)   Diet/type: Regular consistency (see orders) DVT prophylaxis: other Hold eliquis in setting of pending procedure GI prophylaxis: N/A Lines: N/A Foley:  N/A Code Status:  full code Last date of multidisciplinary goals of care discussion [11/23] Per primary team  Critical care time: N/A   Care Time: 50 min. Coordinated care.  Rodman Pickle, M.D. [facility] Pulmonary/Critical Care Medicine 03/16/2022 9:50 AM   Please see Amion for pager number to reach on-call Pulmonary and Critical Care Team.

## 2022-03-16 NOTE — Plan of Care (Signed)

## 2022-03-18 LAB — PROCALCITONIN: Procalcitonin: 0.15 ng/mL

## 2022-03-19 ENCOUNTER — Encounter (HOSPITAL_BASED_OUTPATIENT_CLINIC_OR_DEPARTMENT_OTHER): Payer: Self-pay | Admitting: Pulmonary Disease

## 2022-03-19 ENCOUNTER — Other Ambulatory Visit: Payer: Self-pay

## 2022-03-19 ENCOUNTER — Ambulatory Visit (HOSPITAL_BASED_OUTPATIENT_CLINIC_OR_DEPARTMENT_OTHER): Payer: Medicare Other

## 2022-03-19 ENCOUNTER — Ambulatory Visit (INDEPENDENT_AMBULATORY_CARE_PROVIDER_SITE_OTHER): Payer: Medicare Other | Admitting: Pulmonary Disease

## 2022-03-19 VITALS — BP 156/82 | HR 70 | Ht 65.5 in | Wt 234.0 lb

## 2022-03-19 DIAGNOSIS — G4734 Idiopathic sleep related nonobstructive alveolar hypoventilation: Secondary | ICD-10-CM | POA: Diagnosis not present

## 2022-03-19 DIAGNOSIS — J90 Pleural effusion, not elsewhere classified: Secondary | ICD-10-CM

## 2022-03-19 DIAGNOSIS — G4733 Obstructive sleep apnea (adult) (pediatric): Secondary | ICD-10-CM

## 2022-03-19 MED ORDER — LEVOTHYROXINE SODIUM 100 MCG PO CAPS: 1.0000 | ORAL_CAPSULE | Freq: Every day | ORAL | 0 refills | Status: AC

## 2022-03-19 NOTE — Progress Notes (Signed)
Subjective:   [name]: Lynn Joseph GENDER: female DOB: 05-31-1945, MRN: [id number]  Chief Complaint  Patient presents with   Hospitalization Follow-up    Feels like she's not getting enough air at night and waking up Tirosint is suppose to be 129mg, she's unable to cut in 0.8 due to it being a gel cap needs new rx    Reason for Visit: Hospital follow-up  Ms. CJanney Priegois a 76year old femaleyear old female with recent [place] for new onset atrial fibrillation and pericapsular hematoma on 11/16-11/21. During this hospitalization she underwent lap cholecystectomy on 76/66without complications. After discharge on 11/22 she returned to the ED with complaints of shortness of breath with laying down. Reports weight gain of 15lbs and lower extremity swelling. Denies fevers, chills. CXR demonstrated right moderate effusion with atelectasis. [facility] for [facility]acentesis and pleural fluid management.   Has annual history of respiratory illness requiring treatment and possible pleural effusion in Sept 2023. Denies any history of thoracentesis. CT CAP 03/08/22 with cardiomegaly and small/moderate right pleural effusion during this effusion. Has had shortness of breath, wheezing and cough.  Today she reports she overall feels she is stronger and doing better. Walking more often. Does feels she is not getting enough air and waking up at night. While in the hospital she reports needing to wear oxygen at night.   Social History: Denies any smoking history however lived with smoker and had wood burning stove used for heat and cooking in home for >30 years   I have personally reviewed patient's past medical/family/social history, allergies, current medications.  Past Medical History:  Diagnosis Date   Anemia of chronic disease    Chronic diarrhea    Glaucoma    HTN (hypertension)     Hypertension    Hypothyroid    Obesity    Skin cancer      Family History  Problem Relation Age of Onset   Hypertension Mother    Thyroid disease Mother    Stroke Mother    Kidney disease Father    Diabetes Father      Social History   Occupational History   Not on file  Tobacco Use   Smoking status: Never   Smokeless tobacco: Never  Vaping Use   Vaping Use: Never used  Substance and Sexual Activity   Alcohol use: Never    Comment: 1 glass of wine weekly   Drug use: No   Sexual activity: Not on file    Allergies  Allergen Reactions   Amlodipine Swelling and Other (See Comments)    Lowered BP to much, lethargic    Coreg [Carvedilol]    Penicillin G Sodium Other (See Comments)    GI upset      Outpatient Medications Prior to Visit  Medication Sig Dispense Refill   Acetaminophen (TYLENOL PO) Take 2 tablets by mouth 2 (two) times daily as needed (pain).     apixaban (ELIQUIS) 5 MG TABS tablet Take 1 tablet (5 mg total) by mouth 2 (two) times daily. 60 tablet 0   gabapentin (NEURONTIN) 100 MG capsule Take 100 mg by mouth at bedtime as needed (pain).     hydrALAZINE (APRESOLINE) 25 MG tablet Take 25 mg by mouth 3 (three) times daily.     metoprolol tartrate (LOPRESSOR) 100 MG tablet Take 100 mg by mouth 2 (two) times daily.  oxyCODONE (OXY IR/ROXICODONE) 5 MG immediate release tablet Take 1 tablet (5 mg total) by mouth every 6 (six) hours as needed for up to 3 days for severe pain. 5 tablet 0   Thiamine HCl (VITAMIN B-1 PO) Take 1 tablet by mouth daily.     TIROSINT 125 MCG CAPS Take 0.8 capsules (100 mcg total) by mouth daily. 30 capsule 1   No facility-administered medications prior to visit.    Review of Systems  Constitutional:  Negative for chills, diaphoresis, fever, malaise/fatigue and weight loss.  HENT:  Negative for congestion.   Respiratory:  Positive for shortness of breath. Negative for cough, hemoptysis, sputum production and wheezing.    Cardiovascular:  Negative for chest pain, palpitations and leg swelling.     Objective:   Vitals:   03/19/22 1453  BP: (!) 156/82  Pulse: 70  SpO2: 96%  Weight: 234 lb (106.1 kg)  Height: 5' 5.5" (1.664 m)   SpO2: 96 % O2 Device: None (Room air)  Physical Exam: General: Well-appearing, no acute distress HENT: [place], AT Eyes: EOMI, no scleral icterus Respiratory: Diminished right base but otherwise clear to auscultation bilaterally.  No crackles, wheezing or rales Cardiovascular: RRR, -M/R/G, no JVD Extremities:-Edema,-tenderness Neuro: AAO x4, CNII-XII grossly intact Psych: Normal mood, normal affect  Data Reviewed:  Imaging: CXR 03/19/22 - Increased right pleural effusion  PFT: None on file  Labs:  Thoracentesis 03/16/2022 Red turbid fluid with cell count 1990 N 17% L 72% M percent Protein ratio 3.5/6.4 LDH ratio 141/251 Culture - NGTD, final pending Cytology - in process Glucose - 125    Assessment & Plan:   Discussion: 76 year old female with HTN, hypothyroidism, atrial fibrillation on AC, s/p roux en y gastric bypass in 2009, CKD IIIb, ?diastolic heart failure who presents for hospital follow-up.   Right pleural effusion Unclear if this is new since her prior hospital admission in 11/16-11/21 for chronic with self-reported effusion since June 2023. Could be related to cardiac however effusion is unilateral.  Prior history of recurrent pneumonias may suggest hydropneumothorax which is likely based on thoracentesis performed on 03/16/2022. Negative pressure was noted during procedure and follow-up chest x-ray demonstrated recurrent of effusion immediately after.  Doubt infectious etiology.  No oxygen requirements.  Thoracentesis labs reviewed with borderline exudative effusion. Lymphocytic predominance. Cultures NTD. Cytology pending  --Continue regular aerobic exercise 3-5 days a week for 30 minutes --Refer to Cardiothoracic Surgery to discuss management  options for effusion --If you have worsening shortness of breath, please contact office and we can obtain repeat CXR to determine if another thoracentesis is needed  History of OSA Nocturnal hypoxemia Concerned for needing oxygen night while in hospital --Order ONO on room air --Consider home sleep study in the future  Hyperthyroidism --Was advised to reduce medication to 100 mcg however only has 125 mcg tablets --Ordered 100 mcg dosing x 30 day supply. Advised to contact PCP for further refills  Health Maintenance Immunization History  Administered Date(s) Administered   Influenza, High Dose Seasonal PF 01/31/2016   PFIZER(Purple Top)SARS-COV-2 Vaccination 05/15/2019, 06/05/2019   CT Lung Screen not qualified. Never smoker  Orders Placed This Encounter  Procedures   DG Chest 2 View    Standing Status:   Future    Number of Occurrences:   1    Standing Expiration Date:   03/20/2023    Scheduling Instructions:     Room 12    Order Specific Question:   Reason for Exam (SYMPTOM  OR DIAGNOSIS REQUIRED)    Answer:   pulmonary effusion    Order Specific Question:   Preferred imaging location?    Answer:   Internal  No orders of the defined types were placed in this encounter.   Return in about 2 months (around 05/19/2022).  I have spent a total time of 40-minutes on the day of the appointment reviewing prior documentation, coordinating care and discussing medical diagnosis and plan with the patient/family. Imaging, labs and tests included in this note have been reviewed and interpreted independently by me.  [place], MD [facility] 03/19/2022 3:07 PM  Office Number [phone]

## 2022-03-19 NOTE — Patient Instructions (Signed)
Right pleural effusion --Continue regular aerobic exercise 3-5 days a week for 30 minutes --Refer to Cardiothoracic Surgery to discuss management options for effusion --If you have worsening shortness of breath, please contact office and we can obtain repeat CXR to determine if another thoracentesis is needed  Follow-up with me in 2 months with CXR prior to visit

## 2022-03-20 LAB — BODY FLUID CULTURE W GRAM STAIN: Culture: NO GROWTH

## 2022-03-20 LAB — CYTOLOGY - NON PAP

## 2022-03-21 ENCOUNTER — Encounter: Payer: Self-pay | Admitting: Internal Medicine

## 2022-03-21 ENCOUNTER — Ambulatory Visit: Payer: Medicare Other | Attending: Internal Medicine | Admitting: Cardiology

## 2022-03-21 ENCOUNTER — Telehealth: Payer: Self-pay | Admitting: Cardiovascular Disease

## 2022-03-21 ENCOUNTER — Telehealth (HOSPITAL_BASED_OUTPATIENT_CLINIC_OR_DEPARTMENT_OTHER): Payer: Self-pay

## 2022-03-21 VITALS — BP 160/80 | HR 76 | Ht 65.0 in | Wt 237.0 lb

## 2022-03-21 DIAGNOSIS — I480 Paroxysmal atrial fibrillation: Secondary | ICD-10-CM | POA: Diagnosis not present

## 2022-03-21 DIAGNOSIS — J90 Pleural effusion, not elsewhere classified: Secondary | ICD-10-CM

## 2022-03-21 DIAGNOSIS — I10 Essential (primary) hypertension: Secondary | ICD-10-CM | POA: Insufficient documentation

## 2022-03-21 MED ORDER — HYDRALAZINE HCL 50 MG PO TABS: 50.0000 mg | ORAL_TABLET | Freq: Three times a day (TID) | ORAL | 3 refills | Status: DC

## 2022-03-21 NOTE — Progress Notes (Unsigned)
Cardiology Office Note:    Date:  03/26/2022   ID:  Lynn Joseph, DOB 06/05/1945, MRN [id number]  PCP:  Velna Hatchet, MD   [facility] Cardiologist:  Ida Rogue, MD     Referring MD: Velna Hatchet, MD   Chief Complaint  Patient presents with   Hospitalization Follow-up    New onset A Fib; Patient reports getting elevated blood pressure readings since discharge (systolic reading between 478-295'A).    History of Present Illness:    Lynn Joseph is a 76 y.o. female with a hx of hypertension, atrial fibrillation, CKD hypothyroidism, who presents due to hypertension.  Admitted to the hospital earlier this month for cholecystitis, underwent cholecystectomy.  Was diagnosed with atrial fibrillation rapid ventricular response in the context of cholecystitis.  Managed with IV Cardizem, and metoprolol with spontaneous conversion to sinus rhythm.  Started on Lopressor 100 mg twice daily.  Eliquis 5 twice daily for stroke prophylaxis.  Since discharge, her blood pressures have been running high, systolics in the 213Y.  Echocardiogram 03/09/2022 EF 55 to 60%  Past Medical History:  Diagnosis Date   Anemia of chronic disease    Chronic diarrhea    Glaucoma    HTN (hypertension)    Hypertension    Hypothyroid    Obesity    Skin cancer     Past Surgical History:  Procedure Laterality Date   CHOLECYSTECTOMY N/A 03/11/2022   Procedure: LAPAROSCOPIC CHOLECYSTECTOMY;  Surgeon: Dwan Bolt, MD;  Location: [facility];  Service: General;  Laterality: N/A;   GASTRIC BYPASS     GASTRIC RESTRICTION SURGERY     IR SINUS/FIST TUBE CHK-NON GI  03/09/2022   TUBAL LIGATION      Current Medications: Current Meds  Medication Sig   Acetaminophen (TYLENOL PO) Take 2 tablets by mouth 2 (two) times daily as needed (pain).   apixaban (ELIQUIS) 5 MG TABS tablet Take 1 tablet (5 mg total) by mouth 2 (two) times daily.   gabapentin (NEURONTIN) 100 MG capsule Take  100 mg by mouth at bedtime as needed (pain).   metoprolol tartrate (LOPRESSOR) 100 MG tablet Take 100 mg by mouth 2 (two) times daily.   Thiamine HCl (VITAMIN B-1 PO) Take 1 tablet by mouth daily.   [DISCONTINUED] hydrALAZINE (APRESOLINE) 25 MG tablet Take 25 mg by mouth 3 (three) times daily.     Allergies:   Amlodipine, Coreg [carvedilol], and Penicillin g sodium   Social History   Socioeconomic History   Marital status: Widowed    Spouse name: Not on file   Number of children: Not on file   Years of education: Not on file   Highest education level: Not on file  Occupational History   Not on file  Tobacco Use   Smoking status: Never   Smokeless tobacco: Never  Vaping Use   Vaping Use: Never used  Substance and Sexual Activity   Alcohol use: Not Currently    Comment: 1 glass of wine weekly   Drug use: No   Sexual activity: Not on file  Other Topics Concern   Not on file  Social History Narrative   ** Merged History Encounter **       Drinks 1-2 cups of caffeine daily.   Social Determinants of Health   Financial Resource Strain: Not on file  Food Insecurity: No Food Insecurity (03/15/2022)   Hunger Vital Sign    Worried About Running Out of Food in the Last Year: Never  true    Ran Out of Food in the Last Year: Never true  Transportation Needs: No Transportation Needs (03/15/2022)   PRAPARE - Hydrologist (Medical): No    Lack of Transportation (Non-Medical): No  Physical Activity: Not on file  Stress: Not on file  Social Connections: Not on file     Family History: The patient's family history includes Diabetes in her father; Hypertension in her mother; Kidney disease in her father; Stroke in her mother; Thyroid disease in her mother.  ROS:   Please see the history of present illness.     All other systems reviewed and are negative.  EKGs/Labs/Other Studies Reviewed:    The following studies were reviewed today:   EKG:  EKG is   ordered today.  The ekg ordered today demonstrates normal sinus rhythm, heart rate 76  Recent Labs: 03/14/2022: B Natriuretic Peptide 371.7; TSH 0.328 03/15/2022: ALT 18 03/16/2022: BUN 27; Creatinine, Ser 1.58; Hemoglobin 8.6; Magnesium 2.2; Platelets 277; Potassium 4.0; Sodium 141  Recent Lipid Panel    Component Value Date/Time   CHOL 160 10/28/2018 1758   TRIG 64 10/28/2018 1758   HDL 55 10/28/2018 1758   CHOLHDL 2.9 10/28/2018 1758   VLDL 13 10/28/2018 1758   LDLCALC 92 10/28/2018 1758     Risk Assessment/Calculations:          Physical Exam:    VS:  BP (!) 160/80 (BP Location: Left Arm, Patient Position: Sitting, Cuff Size: Large)   Pulse 76   Ht '5\' 5"'$  (1.651 m)   Wt 237 lb (107.5 kg)   SpO2 98%   BMI 39.44 kg/m     Wt Readings from Last 3 Encounters:  03/21/22 237 lb (107.5 kg)  03/19/22 234 lb (106.1 kg)  03/15/22 235 lb 12.8 oz (107 kg)     GEN:  Well nourished, well developed in no acute distress HEENT: Normal NECK: No JVD; No carotid bruits CARDIAC: RRR, no murmurs, rubs, gallops RESPIRATORY:  Clear to auscultation without rales, wheezing or rhonchi  ABDOMEN: Soft, non-tender, non-distended MUSCULOSKELETAL:  No edema; No deformity  SKIN: Warm and dry NEUROLOGIC:  Alert and oriented x 3 PSYCHIATRIC:  Normal affect   ASSESSMENT:    1. Primary hypertension   2. Paroxysmal atrial fibrillation (HCC)    PLAN:    In order of problems listed above:  Hypertension, BP elevated.  Increase hydralazine to 50 mg 3 times daily.  Check BP at home and keep log.  If BP stays elevated, titrate hydralazine further.  Continue Lopressor.  Patient has allergies to Coreg, Norvasc.  Not on HCTZ due to renal dysfunction. Paroxysmal atrial fibrillation, maintaining sinus rhythm, continue Lopressor, Eliquis.  Follow-up in 4 to 6 weeks with Dr. Rockey Situ or APP.      Medication Adjustments/Labs and Tests Ordered: Current medicines are reviewed at length with the patient  today.  Concerns regarding medicines are outlined above.  Orders Placed This Encounter  Procedures   EKG 12-Lead   Meds ordered this encounter  Medications   DISCONTD: hydrALAZINE (APRESOLINE) 50 MG tablet    Sig: Take 1 tablet (50 mg total) by mouth 3 (three) times daily.    Dispense:  270 tablet    Refill:  3   DISCONTD: hydrALAZINE (APRESOLINE) 50 MG tablet    Sig: Take 1 tablet (50 mg total) by mouth 3 (three) times daily.    Dispense:  270 tablet    Refill:  3  hydrALAZINE (APRESOLINE) 50 MG tablet    Sig: Take 1 tablet (50 mg total) by mouth 3 (three) times daily.    Dispense:  270 tablet    Refill:  3    Patient Instructions  Medication Instructions:  INCREASE Hydralazine to 50 mg three times a day  *If you need a refill on your cardiac medications before your next appointment, please call your pharmacy*   Lab Work: None ordered today   Testing/Procedures: None ordered today   Follow-Up: At [facility], you and your health needs are our priority.  As part of our continuing mission to provide you with exceptional heart care, we have created designated Provider Care Teams.  These Care Teams include your primary Cardiologist (physician) and Advanced Practice Providers (APPs -  Physician Assistants and Nurse Practitioners) who all work together to provide you with the care you need, when you need it.  We recommend signing up for the patient portal called "MyChart".  Sign up information is provided on this After Visit Summary.  MyChart is used to connect with patients for Virtual Visits (Telemedicine).  Patients are able to view lab/test results, encounter notes, upcoming appointments, etc.  Non-urgent messages can be sent to your provider as well.   To learn more about what you can do with MyChart, go to [email or web address].    Your next appointment:   4 week(s)  The format for your next appointment:   In Person  Provider:   You may see Ida Rogue, MD or one of the following Advanced Practice Providers on your designated Care Team:   Murray Hodgkins, NP Christell Faith, PA-C Cadence Kathlen Mody, PA-C Gerrie Nordmann, NP           Signed, Kate Sable, MD  03/26/2022 3:04 PM    [place]

## 2022-03-21 NOTE — Patient Instructions (Signed)
Medication Instructions:  INCREASE Hydralazine to 50 mg three times a day  *If you need a refill on your cardiac medications before your next appointment, please call your pharmacy*   Lab Work: None ordered today   Testing/Procedures: None ordered today   Follow-Up: At [facility], you and your health needs are our priority.  As part of our continuing mission to provide you with exceptional heart care, we have created designated Provider Care Teams.  These Care Teams include your primary Cardiologist (physician) and Advanced Practice Providers (APPs -  Physician Assistants and Nurse Practitioners) who all work together to provide you with the care you need, when you need it.  We recommend signing up for the patient portal called "MyChart".  Sign up information is provided on this After Visit Summary.  MyChart is used to connect with patients for Virtual Visits (Telemedicine).  Patients are able to view lab/test results, encounter notes, upcoming appointments, etc.  Non-urgent messages can be sent to your provider as well.   To learn more about what you can do with MyChart, go to [email or web address].    Your next appointment:   4 week(s)  The format for your next appointment:   In Person  Provider:   You may see Ida Rogue, MD or one of the following Advanced Practice Providers on your designated Care Team:   Murray Hodgkins, NP Christell Faith, PA-C Cadence Kathlen Mody, PA-C Gerrie Nordmann, NP

## 2022-03-21 NOTE — Telephone Encounter (Signed)
Error

## 2022-03-21 NOTE — Telephone Encounter (Signed)
Spoke with patient. She confirmed that she is still taking Hydralazine 25 MG three times a day, and Lopressor 100 MG twice a day. After speaking with patient I saw a telephone encounter with Georgana Curio on 02/16/22 wher she was advised to increase her Hydralazine to 50 MG TID. Need to confirm actual dose patient is taking at appointment today.  Patient states that her BP has been elevated since she was discharged from the hospital recently on 11/24. Patient was admitted with a right sided pleural effusion and new onset Afib. Patients AVS does not recommend follow up with Cardiology. Appears she was started on Eliquis.  Scheduled patient in Dr. Marisue Humble 3:20 PM  DOD slot.

## 2022-03-21 NOTE — Telephone Encounter (Signed)
Pt c/o BP issue: STAT if pt c/o blurred vision, one-sided weakness or slurred speech  1. What are your last 5 BP readings? 196/85 161/81 183/82 189/78 175/83  2. Are you having any other symptoms (ex. Dizziness, headache, blurred vision, passed out)? Pt states she had headache all day on 11/28  3. What is your BP issue? Patient states that she was recently in [place], but since being home she can not get her bp to lower. Requesting call back to discuss medication.

## 2022-03-21 NOTE — Telephone Encounter (Signed)
Spk to patient agreed to PFT before office visit

## 2022-03-21 NOTE — Progress Notes (Deleted)
   Follow-up Outpatient Visit Date: 03/21/2022  Primary Care Provider: Velna Hatchet, [place] [address]  Chief Complaint: ***  HPI:  Ms. Mckowen is a 76 y.o. female with history of ***, who presents for follow-up of ***.  --------------------------------------------------------------------------------------------------  ***  Recent CV Pertinent Labs: Lab Results  Component Value Date   CHOL 160 10/28/2018   HDL 55 10/28/2018   LDLCALC 92 10/28/2018   TRIG 64 10/28/2018   CHOLHDL 2.9 10/28/2018   INR 1.3 (H) 03/11/2022   BNP 371.7 (H) 03/14/2022   K 4.0 03/16/2022   MG 2.2 03/16/2022   BUN 27 (H) 03/16/2022   CREATININE 1.58 (H) 03/16/2022    Past medical and surgical history were reviewed and updated in EPIC.  No outpatient medications have been marked as taking for the 03/21/22 encounter (Appointment) with Naftula Donahue, Harrell Gave, MD.    Allergies: Amlodipine, Coreg [carvedilol], and Penicillin g sodium  Social History   Tobacco Use   Smoking status: Never   Smokeless tobacco: Never  Vaping Use   Vaping Use: Never used  Substance Use Topics   Alcohol use: Never    Comment: 1 glass of wine weekly   Drug use: No    Family History  Problem Relation Age of Onset   Hypertension Mother    Thyroid disease Mother    Stroke Mother    Kidney disease Father    Diabetes Father     Review of Systems: A 12-system review of systems was performed and was negative except as noted in the HPI.  --------------------------------------------------------------------------------------------------  Physical Exam: There were no vitals taken for this visit.  General:  NAD. Neck: No JVD or HJR. Lungs: Clear to auscultation bilaterally without wheezes or crackles. Heart: Regular rate and rhythm without murmurs, rubs, or gallops. Abdomen: Soft, nontender, nondistended. Extremities: No lower extremity edema.  EKG:  ***  Lab Results  Component Value  Date   WBC 9.4 03/16/2022   HGB 8.6 (L) 03/16/2022   HCT 27.6 (L) 03/16/2022   MCV 90.5 03/16/2022   PLT 277 03/16/2022    Lab Results  Component Value Date   NA 141 03/16/2022   K 4.0 03/16/2022   CL 107 03/16/2022   CO2 26 03/16/2022   BUN 27 (H) 03/16/2022   CREATININE 1.58 (H) 03/16/2022   GLUCOSE 90 03/16/2022   ALT 18 03/15/2022    Lab Results  Component Value Date   CHOL 160 10/28/2018   HDL 55 10/28/2018   LDLCALC 92 10/28/2018   TRIG 64 10/28/2018   CHOLHDL 2.9 10/28/2018    --------------------------------------------------------------------------------------------------  ASSESSMENT AND PLAN: Nelva Bush, MD 03/21/2022 12:58 PM

## 2022-03-21 NOTE — Telephone Encounter (Signed)
-----   Message from [place], MD sent at 03/19/2022  4:12 PM EST ----- Regarding: Follow-up 30 min. PFTs prior to visit Please schedule patient for 30 min follow-up with PFTs prior to visit. Thanks

## 2022-03-23 DIAGNOSIS — E669 Obesity, unspecified: Secondary | ICD-10-CM | POA: Diagnosis not present

## 2022-03-23 DIAGNOSIS — G4733 Obstructive sleep apnea (adult) (pediatric): Secondary | ICD-10-CM | POA: Diagnosis not present

## 2022-03-23 DIAGNOSIS — I5030 Unspecified diastolic (congestive) heart failure: Secondary | ICD-10-CM | POA: Diagnosis not present

## 2022-03-23 DIAGNOSIS — Z6835 Body mass index (BMI) 35.0-35.9, adult: Secondary | ICD-10-CM | POA: Diagnosis not present

## 2022-03-23 DIAGNOSIS — D649 Anemia, unspecified: Secondary | ICD-10-CM | POA: Diagnosis not present

## 2022-03-23 DIAGNOSIS — E039 Hypothyroidism, unspecified: Secondary | ICD-10-CM | POA: Diagnosis not present

## 2022-03-23 DIAGNOSIS — J90 Pleural effusion, not elsewhere classified: Secondary | ICD-10-CM | POA: Diagnosis not present

## 2022-03-23 DIAGNOSIS — N1832 Chronic kidney disease, stage 3b: Secondary | ICD-10-CM | POA: Diagnosis not present

## 2022-03-23 DIAGNOSIS — I130 Hypertensive heart and chronic kidney disease with heart failure and stage 1 through stage 4 chronic kidney disease, or unspecified chronic kidney disease: Secondary | ICD-10-CM | POA: Diagnosis not present

## 2022-03-23 DIAGNOSIS — I480 Paroxysmal atrial fibrillation: Secondary | ICD-10-CM | POA: Diagnosis not present

## 2022-03-23 DIAGNOSIS — K831 Obstruction of bile duct: Secondary | ICD-10-CM | POA: Diagnosis not present

## 2022-03-23 NOTE — Telephone Encounter (Signed)
Concerns addressed at office visit on 03/21/22.

## 2022-03-27 ENCOUNTER — Ambulatory Visit: Payer: Medicare Other | Admitting: Medical

## 2022-04-03 ENCOUNTER — Other Ambulatory Visit (HOSPITAL_COMMUNITY): Payer: Self-pay | Admitting: Interventional Radiology

## 2022-04-03 DIAGNOSIS — K8050 Calculus of bile duct without cholangitis or cholecystitis without obstruction: Secondary | ICD-10-CM

## 2022-04-05 ENCOUNTER — Telehealth (HOSPITAL_BASED_OUTPATIENT_CLINIC_OR_DEPARTMENT_OTHER): Payer: Self-pay | Admitting: Pulmonary Disease

## 2022-04-05 NOTE — Telephone Encounter (Signed)
[facility] Telephone Encounter  ONO 03/30/22 - SpO2 <88% 15 min 33 sec Nadir SpO2 78%  Assessment/Plan   Nocturnal Hypoxemia --Discussed findings on ONO with patient and that she would qualify for oxygen --After discussion patient declined and would prefer re-evaluation at later time with either repeat ONO (with nail polish) vs home sleep study. --Keep next follow-up visit

## 2022-04-06 ENCOUNTER — Encounter: Payer: Medicare Other | Admitting: Thoracic Surgery (Cardiothoracic Vascular Surgery)

## 2022-04-06 ENCOUNTER — Other Ambulatory Visit (HOSPITAL_COMMUNITY): Payer: Self-pay | Admitting: Interventional Radiology

## 2022-04-06 ENCOUNTER — Ambulatory Visit (HOSPITAL_COMMUNITY)
Admission: RE | Admit: 2022-04-06 | Discharge: 2022-04-06 | Disposition: A | Discharge status: Home / Self Care | Payer: Medicare Other | Source: Ambulatory Visit | Attending: Interventional Radiology | Admitting: Interventional Radiology

## 2022-04-06 DIAGNOSIS — Z434 Encounter for attention to other artificial openings of digestive tract: Secondary | ICD-10-CM | POA: Diagnosis not present

## 2022-04-06 DIAGNOSIS — K8050 Calculus of bile duct without cholangitis or cholecystitis without obstruction: Secondary | ICD-10-CM

## 2022-04-06 DIAGNOSIS — K831 Obstruction of bile duct: Secondary | ICD-10-CM | POA: Diagnosis not present

## 2022-04-06 HISTORY — PX: IR REMOVAL BILIARY DRAIN: IMG6047

## 2022-04-06 MED ORDER — LIDOCAINE HCL 1 % IJ SOLN
INTRAMUSCULAR | Status: AC
  Filled 2022-04-06: qty 20

## 2022-04-06 MED ORDER — IOHEXOL 300 MG/ML  SOLN
50.0000 mL | Freq: Once | INTRAMUSCULAR | Status: AC | PRN
  Administered 2022-04-06: 10 mL

## 2022-04-06 NOTE — Procedures (Signed)
Interventional Radiology Procedure Note  Procedure:  Biliary drain injection and removal  Findings: Please refer to procedural dictation for full description. No significant biliary ductal dilation. Drain removed successfully.  Complications: None immediate  Estimated Blood Loss: < 5 ml  Recommendations: Follow up with IR as needed.   Ruthann Cancer, MD

## 2022-04-09 DIAGNOSIS — E785 Hyperlipidemia, unspecified: Secondary | ICD-10-CM | POA: Diagnosis not present

## 2022-04-17 ENCOUNTER — Other Ambulatory Visit: Payer: Self-pay

## 2022-04-17 MED ORDER — APIXABAN 5 MG PO TABS: 5.0000 mg | ORAL_TABLET | Freq: Two times a day (BID) | ORAL | 1 refills | Status: DC

## 2022-04-17 NOTE — Telephone Encounter (Signed)
Prescription refill request for Eliquis received. Indication:afib Last office visit:11/23 Scr:1.6 Age: 76 Weight:107.5  kg  Prescription refilled

## 2022-04-18 ENCOUNTER — Encounter (HOSPITAL_BASED_OUTPATIENT_CLINIC_OR_DEPARTMENT_OTHER): Payer: Self-pay | Admitting: Pulmonary Disease

## 2022-04-24 NOTE — Progress Notes (Signed)
Cardiology Clinic Note   Patient Name: Lynn Joseph Date of Encounter: 04/25/2022  Primary Care Provider:  Alysia Penna, MD Primary Cardiologist:  Julien Nordmann, MD  Patient Profile    77 year old female with a past medical history of hypertension, paroxysmal atrial fibrillation on chronic anticoagulation with apixaban, CKD, hypothyroidism, who presents today for follow-up on paroxysmal atrial fibrillation and hypertension.  Past Medical History    Past Medical History:  Diagnosis Date   Anemia of chronic disease    Chronic diarrhea    Glaucoma    HTN (hypertension)    Hypertension    Hypothyroid    Obesity    Skin cancer    Past Surgical History:  Procedure Laterality Date   CHOLECYSTECTOMY N/A 03/11/2022   Procedure: LAPAROSCOPIC CHOLECYSTECTOMY;  Surgeon: Fritzi Mandes, MD;  Location: [facility] OR;  Service: General;  Laterality: N/A;   GASTRIC BYPASS     GASTRIC RESTRICTION SURGERY     IR REMOVAL BILIARY DRAIN  04/06/2022   IR SINUS/FIST TUBE CHK-NON GI  03/09/2022   TUBAL LIGATION      Allergies  Allergies  Allergen Reactions   Amlodipine Swelling and Other (See Comments)    Lowered BP to much, lethargic    Coreg [Carvedilol]    Penicillin G Sodium Other (See Comments)    GI upset     History of Present Illness    Lynn Joseph is a 77 year old female with previously mentioned past medical history of hypertension, paroxysmal atrial fibrillation on chronic anticoagulation with apixaban, CKD, and hypothyroidism.  Patient was hospitalized at [facility] in 03/08/2022 with new onset atrial fibrillation RVR.  She was to undergo surgery for acute cholecystitis.  She was managed with IV diltiazem and metoprolol with spontaneous conversion to sinus rhythm.  She was started on Lopressor 100 mg twice daily and apixaban 5 mg twice daily for stroke prophylaxis for CHA2DS2-VASc score of at least 4.  She was last seen in clinic 03/21/2022 by Dr. Azucena Cecil with  elevated blood pressures running systolic in the 180s.  Her hydralazine was increased to 50 mg 3 times daily.  She was to continue to keep a blood pressure log at home.  Of note she is unable to take Coreg and Norvasc.  She returns clinic today stating that overall she has been feeling well. She does have complaints of numbness and tingling in her feet and toes. She denies any chest pain, shortness of breath, palpitations, visual changes, or headaches. She has continued to keep blood pressure log at home and her blood pressures have continued to run systolic 180-150's. She was also restarted on triamterene/hctz 37.5/25mg  daily by another provider. She denies any recent hospitalizations or visits to the emergency department.   Home Medications    Current Outpatient Medications  Medication Sig Dispense Refill   Acetaminophen (TYLENOL PO) Take 2 tablets by mouth 2 (two) times daily as needed (pain).     apixaban (ELIQUIS) 5 MG TABS tablet Take 1 tablet (5 mg total) by mouth 2 (two) times daily. 180 tablet 1   gabapentin (NEURONTIN) 100 MG capsule Take 100 mg by mouth at bedtime as needed (pain).     Levothyroxine Sodium (TIROSINT) 100 MCG CAPS Take 1 capsule (100 mcg total) by mouth daily before breakfast. 30 capsule 0   metoprolol tartrate (LOPRESSOR) 100 MG tablet Take 100 mg by mouth 2 (two) times daily.     Thiamine HCl (VITAMIN B-1 PO) Take 1 tablet by mouth  daily.     triamterene-hydrochlorothiazide (DYAZIDE) 37.5-25 MG capsule Take 1 capsule by mouth daily.     hydrALAZINE (APRESOLINE) 25 MG tablet Take 3 tablets (75 mg total) by mouth 3 (three) times daily. 270 tablet 1   No current facility-administered medications for this visit.     Family History    Family History  Problem Relation Age of Onset   Hypertension Mother    Thyroid disease Mother    Stroke Mother    Kidney disease Father    Diabetes Father    She indicated that her mother is deceased. She indicated that her father  is deceased.  Social History    Social History   Socioeconomic History   Marital status: Widowed    Spouse name: Not on file   Number of children: Not on file   Years of education: Not on file   Highest education level: Not on file  Occupational History   Not on file  Tobacco Use   Smoking status: Never   Smokeless tobacco: Never  Vaping Use   Vaping Use: Never used  Substance and Sexual Activity   Alcohol use: Not Currently    Comment: 1 glass of wine weekly   Drug use: No   Sexual activity: Not on file  Other Topics Concern   Not on file  Social History Narrative   ** Merged History Encounter **       Drinks 1-2 cups of caffeine daily.   Social Determinants of Health   Financial Resource Strain: Not on file  Food Insecurity: No Food Insecurity (03/15/2022)   Hunger Vital Sign    Worried About Running Out of Food in the Last Year: Never true    Ran Out of Food in the Last Year: Never true  Transportation Needs: No Transportation Needs (03/15/2022)   PRAPARE - Administrator, Civil Service (Medical): No    Lack of Transportation (Non-Medical): No  Physical Activity: Not on file  Stress: Not on file  Social Connections: Not on file  Intimate Partner Violence: Not At Risk (03/15/2022)   Humiliation, Afraid, Rape, and Kick questionnaire    Fear of Current or Ex-Partner: No    Emotionally Abused: No    Physically Abused: No    Sexually Abused: No     Review of Systems    General:  No chills, fever, night sweats or weight changes. Endorses fatigue Cardiovascular:  No chest pain, dyspnea on exertion, endorses occasional peripheral edema, orthopnea, palpitations, paroxysmal nocturnal dyspnea. Dermatological: No rash, lesions/masses Respiratory: No cough, dyspnea Urologic: No hematuria, dysuria Abdominal:   No nausea, vomiting, diarrhea, bright red blood per rectum, melena, or hematemesis Neurologic:  No visual changes, wkns, changes in mental status.  Endorses numbness and tingling to the feet and toes All other systems reviewed and are otherwise negative except as noted above.   Physical Exam    VS:  BP (!) 150/74 (BP Location: Right Arm, Patient Position: Sitting, Cuff Size: Large)   Pulse (!) 55   Ht 5\' 5"  (1.651 m)   Wt 235 lb 6.4 oz (106.8 kg)   SpO2 99%   BMI 39.17 kg/m  , BMI Body mass index is 39.17 kg/m.     GEN: Well nourished, well developed, in no acute distress. HEENT: normal. Glasses on Neck: Supple, no JVD, carotid bruits, or masses. Cardiac: RRR, no murmurs, rubs, or gallops. No clubbing, cyanosis, edema.  Radials 2+/PT 2+ and equal bilaterally.  Respiratory:  Respirations regular and unlabored, clear to auscultation bilaterally. GI: Soft, nontender, nondistended, BS + x 4. MS: no deformity or atrophy. Skin: warm and dry, no rash. Neuro:  Strength and sensation are intact. Psych: Normal affect.  Accessory Clinical Findings    ECG personally reviewed by me today- sinus bradycardia rate of 55, LVH - No acute changes  Lab Results  Component Value Date   WBC 9.4 03/16/2022   HGB 8.6 (L) 03/16/2022   HCT 27.6 (L) 03/16/2022   MCV 90.5 03/16/2022   PLT 277 03/16/2022   Lab Results  Component Value Date   CREATININE 1.58 (H) 03/16/2022   BUN 27 (H) 03/16/2022   NA 141 03/16/2022   K 4.0 03/16/2022   CL 107 03/16/2022   CO2 26 03/16/2022   Lab Results  Component Value Date   ALT 18 03/15/2022   AST 16 03/15/2022   ALKPHOS 119 03/15/2022   BILITOT 0.6 03/15/2022   Lab Results  Component Value Date   CHOL 160 10/28/2018   HDL 55 10/28/2018   LDLCALC 92 10/28/2018   TRIG 64 10/28/2018   CHOLHDL 2.9 10/28/2018    No results found for: "HGBA1C"  Assessment & Plan   1.  Essential hypertension with blood pressure today 152/76 and 152/67. She has continued to keep a log at home that show systolic blood pressures running 180's to 150's. Hydralazine is being increased to 75 mg tid. She is also being  scheduled for a renal ultrasound to evaluate for renal artery stenosis. Patient states that she had a procedure done on her kidneys 15-20 years ago. She will continue to keep a log of her blood pressures at home. She is being continued on her other medications of metoprolol tartrate 100 mg bid, triamtereme/hctz 37.5/25 mg daily, and hydralazine 75 mg tid.   2. Paroxysmal atrial fibrillation with current sinus on EKG. Patient is unaware of any recurrent atrial fibrillation episodes. She is maintained on metoprolo tartrate 100 mg bid an apaxiban 5 mg bid for cha2ds2-vasc of at least 4. Tolerating anticoagulation without issues of bleeding.  3. Disposition patient to return to clinic to see MD/APP in 1 month or sooner if needed to re-evaluate increased blood pressure medications and to follow up on renal ultrasound.  CHA2DS2-VASc Score = 4   This indicates a 4.8% annual risk of stroke. The patient's score is based upon: CHF History: 0 HTN History: 1 Diabetes History: 0 Stroke History: 0 Vascular Disease History: 0 Age Score: 2 Gender Score: 1     Nakkia Mackiewicz, NP 04/25/2022, 8:01 PM

## 2022-04-25 ENCOUNTER — Ambulatory Visit: Payer: Medicare Other | Attending: Cardiology | Admitting: Cardiology

## 2022-04-25 ENCOUNTER — Encounter: Payer: Self-pay | Admitting: Cardiology

## 2022-04-25 VITALS — BP 150/74 | HR 55 | Ht 65.0 in | Wt 235.4 lb

## 2022-04-25 DIAGNOSIS — I10 Essential (primary) hypertension: Secondary | ICD-10-CM | POA: Insufficient documentation

## 2022-04-25 DIAGNOSIS — I480 Paroxysmal atrial fibrillation: Secondary | ICD-10-CM | POA: Diagnosis not present

## 2022-04-25 DIAGNOSIS — I701 Atherosclerosis of renal artery: Secondary | ICD-10-CM | POA: Insufficient documentation

## 2022-04-25 MED ORDER — HYDRALAZINE HCL 25 MG PO TABS: 75.0000 mg | ORAL_TABLET | Freq: Three times a day (TID) | ORAL | 1 refills | Status: DC

## 2022-04-25 NOTE — Patient Instructions (Addendum)
Medication Instructions:  INCREASE the Hydralazine to 75 mg three times daily (3 of the 25 mg tablets)  *If you need a refill on your cardiac medications before your next appointment, please call your pharmacy*   Lab Work: None ordered If you have labs (blood work) drawn today and your tests are completely normal, you will receive your results only by: [place] (if you have MyChart) OR A paper copy in the mail If you have any lab test that is abnormal or we need to change your treatment, we will call you to review the results.   Testing/Procedures: Your physician has requested that you have a renal artery duplex. During this test, an ultrasound is used to evaluate blood flow to the kidneys. Take your medications as you usually do. This will take place at [facility] ([place]) [address], [place].  No food after 11PM the night before.  Water is OK. (Don't drink liquids if you have been instructed not to for ANOTHER test). Avoid foods that produce bowel gas, for 24 hours prior to exam (see below). No breakfast, no chewing gum, no smoking or carbonated beverages. Patient may take morning medications with water. Come in for test at least 15 minutes early to register.    Follow-Up: At [facility], you and your health needs are our priority.  As part of our continuing mission to provide you with exceptional heart care, we have created designated Provider Care Teams.  These Care Teams include your primary Cardiologist (physician) and Advanced Practice Providers (APPs -  Physician Assistants and Nurse Practitioners) who all work together to provide you with the care you need, when you need it.  We recommend signing up for the patient portal called "MyChart".  Sign up information is provided on this After Visit Summary.  MyChart is used to connect with patients for Virtual Visits (Telemedicine).  Patients are able to view lab/test results, encounter  notes, upcoming appointments, etc.  Non-urgent messages can be sent to your provider as well.   To learn more about what you can do with MyChart, go to [email or web address].    Your next appointment:   1 month(s)  The format for your next appointment:   In Person  Provider:   You may see Ida Rogue, MD or one of the following Advanced Practice Providers on your designated Care Team:   Murray Hodgkins, NP Christell Faith, PA-C Cadence Kathlen Mody, PA-C Gerrie Nordmann, NP

## 2022-05-01 ENCOUNTER — Ambulatory Visit: Payer: Medicare Other | Attending: Cardiology

## 2022-05-01 ENCOUNTER — Ambulatory Visit: Payer: Medicare Other | Admitting: Cardiovascular Disease

## 2022-05-01 DIAGNOSIS — I701 Atherosclerosis of renal artery: Secondary | ICD-10-CM | POA: Diagnosis not present

## 2022-05-01 NOTE — Progress Notes (Signed)
Abnormal findings that suggest there may be blockage to the renal artery on the right. Follow up appointment to be scheduled with Dr. Fletcher Anon to discuss treatment options.

## 2022-05-02 ENCOUNTER — Ambulatory Visit: Payer: Medicare Other | Attending: Cardiovascular Disease | Admitting: Cardiovascular Disease

## 2022-05-02 ENCOUNTER — Encounter: Payer: Self-pay | Admitting: Cardiovascular Disease

## 2022-05-02 VITALS — BP 160/70 | HR 71 | Ht 65.0 in | Wt 233.5 lb

## 2022-05-02 DIAGNOSIS — I701 Atherosclerosis of renal artery: Secondary | ICD-10-CM | POA: Insufficient documentation

## 2022-05-02 DIAGNOSIS — I480 Paroxysmal atrial fibrillation: Secondary | ICD-10-CM | POA: Diagnosis not present

## 2022-05-02 DIAGNOSIS — E785 Hyperlipidemia, unspecified: Secondary | ICD-10-CM

## 2022-05-02 NOTE — Patient Instructions (Signed)
Medication Instructions:  No changes *If you need a refill on your cardiac medications before your next appointment, please call your pharmacy*   Lab Work: None ordered If you have labs (blood work) drawn today and your tests are completely normal, you will receive your results only by: [place] (if you have MyChart) OR A paper copy in the mail If you have any lab test that is abnormal or we need to change your treatment, we will call you to review the results.   Testing/Procedures: None ordered   Follow-Up: At [facility], you and your health needs are our priority.  As part of our continuing mission to provide you with exceptional heart care, we have created designated Provider Care Teams.  These Care Teams include your primary Cardiologist (physician) and Advanced Practice Providers (APPs -  Physician Assistants and Nurse Practitioners) who all work together to provide you with the care you need, when you need it.  We recommend signing up for the patient portal called "MyChart".  Sign up information is provided on this After Visit Summary.  MyChart is used to connect with patients for Virtual Visits (Telemedicine).  Patients are able to view lab/test results, encounter notes, upcoming appointments, etc.  Non-urgent messages can be sent to your provider as well.   To learn more about what you can do with MyChart, go to [email or web address].    Your next appointment:   2 month(s)  The format for your next appointment:   In Person  Provider:   You may see Dr. Fletcher Anon or one of the following Advanced Practice Providers on your designated Care Team:   Murray Hodgkins, NP Christell Faith, PA-C Cadence Kathlen Mody, PA-C Gerrie Nordmann, NP    Important Information About Sugar

## 2022-05-02 NOTE — Progress Notes (Signed)
Cardiology Office Note   Date:  05/02/2022   ID:  Lynn Joseph, Lynn Joseph 19-Apr-1946, MRN [id number]  PCP:  Velna Hatchet, MD  Cardiologist: Dr. Rockey Situ.  Chief Complaint  Patient presents with   Other    ABN Renal doppler. Meds reviewed verbally with pt.      History of Present Illness: Lynn Joseph is a 77 y.o. female who was referred by Gerrie Nordmann for evaluation of renal artery stenosis. She has known history of paroxysmal atrial fibrillation on long-term anticoagulation, hypertension, hypothyroidism and chronic kidney disease. She was hospitalized in November 2023 with new onset atrial fibrillation with RVR in the setting of acute cholecystitis.  She was treated with IV diltiazem and metoprolol and converted to sinus rhythm.  She was started on anticoagulation with Eliquis given CHA2DS2-VASc score of 4.  She has been dealing with refractory hypertension since then.  Due to that, she was referred for renal artery duplex.  Right renal artery was not well-visualized and possibly occluded with small size right kidney at 8.7 cm.  Left renal artery was patent with normal size left kidney.  She has chronic kidney disease with most recent creatinine of 1.58. Her blood pressure has been uncontrolled and spite of 3 antihypertensive medications including a diuretic.  She reports being told about possible renal artery stenosis more than 15 years ago when she lived in [place].  However, she believes that she had an angiogram done at that time and was told that there was no significant stenosis. She denies chest pain or shortness of breath at this time. She is not diabetic and does not smoke.  Past Medical History:  Diagnosis Date   Anemia of chronic disease    Chronic diarrhea    Glaucoma    HTN (hypertension)    Hypertension    Hypothyroid    Obesity    Skin cancer     Past Surgical History:  Procedure Laterality Date   CHOLECYSTECTOMY N/A 03/11/2022   Procedure:  LAPAROSCOPIC CHOLECYSTECTOMY;  Surgeon: Dwan Bolt, MD;  Location: [place];  Service: General;  Laterality: N/A;   GASTRIC BYPASS     GASTRIC RESTRICTION SURGERY     IR REMOVAL BILIARY DRAIN  04/06/2022   IR SINUS/FIST TUBE CHK-NON GI  03/09/2022   TUBAL LIGATION       Current Outpatient Medications  Medication Sig Dispense Refill   Acetaminophen (TYLENOL PO) Take 2 tablets by mouth 2 (two) times daily as needed (pain).     apixaban (ELIQUIS) 5 MG TABS tablet Take 1 tablet (5 mg total) by mouth 2 (two) times daily. 180 tablet 1   gabapentin (NEURONTIN) 100 MG capsule Take 100 mg by mouth at bedtime as needed (pain).     hydrALAZINE (APRESOLINE) 25 MG tablet Take 3 tablets (75 mg total) by mouth 3 (three) times daily. 270 tablet 1   Levothyroxine Sodium (TIROSINT) 100 MCG CAPS Take 1 capsule (100 mcg total) by mouth daily before breakfast. 30 capsule 0   metoprolol tartrate (LOPRESSOR) 100 MG tablet Take 100 mg by mouth 2 (two) times daily.     Thiamine HCl (VITAMIN B-1 PO) Take 1 tablet by mouth daily.     triamterene-hydrochlorothiazide (DYAZIDE) 37.5-25 MG capsule Take 1 capsule by mouth daily.     No current facility-administered medications for this visit.    Allergies:   Amlodipine, Coreg [carvedilol], and Penicillin g sodium    Social History:  The patient  reports that she  has never smoked. She has never used smokeless tobacco. She reports that she does not currently use alcohol. She reports that she does not use drugs.   Family History:  The patient's family history includes Diabetes in her father; Hypertension in her mother; Kidney disease in her father; Stroke in her mother; Thyroid disease in her mother.    ROS:  Please see the history of present illness.   Otherwise, review of systems are positive for none.   All other systems are reviewed and negative.    PHYSICAL EXAM: VS:  BP (!) 160/70 (BP Location: Left Arm, Patient Position: Sitting, Cuff Size: Large)   Pulse  71   Ht '5\' 5"'$  (1.651 m)   Wt 233 lb 8 oz (105.9 kg)   SpO2 96%   BMI 38.86 kg/m  , BMI Body mass index is 38.86 kg/m. GEN: Well nourished, well developed, in no acute distress  HEENT: normal  Neck: no JVD, carotid bruits, or masses Cardiac: RRR; no murmurs, rubs, or gallops,no edema  Respiratory:  clear to auscultation bilaterally, normal work of breathing GI: soft, nontender, nondistended, + BS MS: no deformity or atrophy  Skin: warm and dry, no rash Neuro:  Strength and sensation are intact Psych: euthymic mood, full affect Distal pulses are normal.   EKG:  EKG is not ordered today.    Recent Labs: 03/14/2022: B Natriuretic Peptide 371.7; TSH 0.328 03/15/2022: ALT 18 03/16/2022: BUN 27; Creatinine, Ser 1.58; Hemoglobin 8.6; Magnesium 2.2; Platelets 277; Potassium 4.0; Sodium 141    Lipid Panel    Component Value Date/Time   CHOL 160 10/28/2018 1758   TRIG 64 10/28/2018 1758   HDL 55 10/28/2018 1758   CHOLHDL 2.9 10/28/2018 1758   VLDL 13 10/28/2018 1758   LDLCALC 92 10/28/2018 1758      Wt Readings from Last 3 Encounters:  05/02/22 233 lb 8 oz (105.9 kg)  04/25/22 235 lb 6.4 oz (106.8 kg)  03/21/22 237 lb (107.5 kg)         12/22/2021   10:38 AM  PAD Screen  Previous PAD dx? No  Previous surgical procedure? No  Pain with walking? No  Feet/toe relief with dangling? No  Painful, non-healing ulcers? No  Extremities discolored? No      ASSESSMENT AND PLAN:  1.  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  The right renal artery was not well-visualized by duplex and possibly was occluded.  However, the study was overall difficult due to obesity.  She does have underlying chronic kidney disease as well as refractory hypertension.  Thus, I recommend proceeding with renal artery angiography and possible endovascular intervention.  I discussed the  procedure in details as well as risk and benefits.  The patient wants to wait few months before proceeding with this.  We will also request her previous record from [place].  2.  Paroxysmal atrial fibrillation: She seems to be in sinus rhythm.  Continue metoprolol.  She is tolerating anticoagulation with Eliquis.  3.  Hyperlipidemia: She reports being on antihyperlipidemia medication that was stopped during her hospitalization in November.  I favor that she goes back on a statin in the near future.   Disposition:   FU with me in 2 months  Signed,  Kathlyn Sacramento, MD  05/02/2022 3:11 PM    [place]

## 2022-05-07 DIAGNOSIS — J90 Pleural effusion, not elsewhere classified: Secondary | ICD-10-CM | POA: Diagnosis not present

## 2022-05-07 DIAGNOSIS — I130 Hypertensive heart and chronic kidney disease with heart failure and stage 1 through stage 4 chronic kidney disease, or unspecified chronic kidney disease: Secondary | ICD-10-CM | POA: Diagnosis not present

## 2022-05-07 DIAGNOSIS — I5030 Unspecified diastolic (congestive) heart failure: Secondary | ICD-10-CM | POA: Diagnosis not present

## 2022-05-07 DIAGNOSIS — N1832 Chronic kidney disease, stage 3b: Secondary | ICD-10-CM | POA: Diagnosis not present

## 2022-05-07 DIAGNOSIS — I480 Paroxysmal atrial fibrillation: Secondary | ICD-10-CM | POA: Diagnosis not present

## 2022-05-07 DIAGNOSIS — I701 Atherosclerosis of renal artery: Secondary | ICD-10-CM | POA: Diagnosis not present

## 2022-05-14 ENCOUNTER — Encounter (HOSPITAL_BASED_OUTPATIENT_CLINIC_OR_DEPARTMENT_OTHER): Payer: Self-pay | Admitting: Pulmonary Disease

## 2022-05-14 ENCOUNTER — Ambulatory Visit (INDEPENDENT_AMBULATORY_CARE_PROVIDER_SITE_OTHER): Payer: Medicare Other

## 2022-05-14 ENCOUNTER — Ambulatory Visit (INDEPENDENT_AMBULATORY_CARE_PROVIDER_SITE_OTHER): Payer: Medicare Other | Admitting: Pulmonary Disease

## 2022-05-14 VITALS — BP 136/70 | HR 55 | Temp 98.1°F | Ht 64.25 in | Wt 233.6 lb

## 2022-05-14 DIAGNOSIS — G4734 Idiopathic sleep related nonobstructive alveolar hypoventilation: Secondary | ICD-10-CM | POA: Diagnosis not present

## 2022-05-14 DIAGNOSIS — J90 Pleural effusion, not elsewhere classified: Secondary | ICD-10-CM

## 2022-05-14 LAB — PULMONARY FUNCTION TEST
DL/VA % pred: 135 %
DL/VA: 5.54 ml/min/mmHg/L
DLCO cor % pred: 107 %
DLCO cor: 20.57 ml/min/mmHg
DLCO unc % pred: 87 %
DLCO unc: 16.72 ml/min/mmHg
FEF 25-75 Pre: 1.87 L/sec
FEF2575-%Pred-Pre: 115 %
FEV1-%Pred-Pre: 85 %
FEV1-Pre: 1.8 L
FEV1FVC-%Pred-Pre: 109 %
FEV6-%Pred-Pre: 82 %
FEV6-Pre: 2.21 L
FEV6FVC-%Pred-Pre: 105 %
FVC-%Pred-Pre: 78 %
FVC-Pre: 2.21 L
Pre FEV1/FVC ratio: 82 %
Pre FEV6/FVC Ratio: 100 %
RV % pred: 119 %
RV: 2.77 L
TLC % pred: 96 %
TLC: 4.9 L

## 2022-05-14 NOTE — Progress Notes (Signed)
Full PFT Performed Today minus Post-Spirometry.

## 2022-05-14 NOTE — Progress Notes (Unsigned)
Subjective:   [name]: Lynn Joseph GENDER: female DOB: [birth year], MRN: [id number]  Chief Complaint  Patient presents with   Follow-up    PFT performed today.  Pt states she has been doing okay since last visit.    Reason for Visit: Hospital follow-up  [name] is a 77 year old female with recent cholecystitis s/p biliary drain placed 11/8 in [place] and recent hospitalization for new onset atrial fibrillation and pericapsular hematoma on 11/16-11/21. During this hospitalization she underwent lap cholecystectomy on 28/41 without complications. After discharge on 11/22 she returned to the ED with complaints of shortness of breath with laying down. Reports weight gain of 15lbs and lower extremity swelling. Denies fevers, chills. CXR demonstrated right moderate effusion with atelectasis. She was given lasix and admitted to [facility]. PCCM consulted for thoracentesis and pleural fluid management.   Has annual history of respiratory illness requiring treatment and possible pleural effusion in Sept 2023. Denies any history of thoracentesis. CT CAP 03/08/22 with cardiomegaly and small/moderate right pleural effusion during this effusion. Has had shortness of breath, wheezing and cough.  Today she reports she overall feels she is stronger and doing better. Walking more often. Does feels she is not getting enough air and waking up at night. While in the hospital she reports needing to wear oxygen at night.  05/14/22 Since our last visit she reports improved energy. Able to walk the dogs. Not having as frequent nocturnal awakenings. Only nightly compared to 3-4 times in the past. Occasional wheezing. Denies cough   Social History: Denies any smoking history however lived with smoker and had wood burning stove used for heat and cooking in home for >30 years    Past Medical History:  Diagnosis Date   Anemia of chronic disease    Chronic diarrhea    Glaucoma    HTN (hypertension)     Hypertension    Hypothyroid    Obesity    Skin cancer      Family History  Problem Relation Age of Onset   Hypertension Mother    Thyroid disease Mother    Stroke Mother    Kidney disease Father    Diabetes Father      Social History   Occupational History   Not on file  Tobacco Use   Smoking status: Never   Smokeless tobacco: Never  Vaping Use   Vaping Use: Never used  Substance and Sexual Activity   Alcohol use: Not Currently    Comment: 1 glass of wine weekly   Drug use: No   Sexual activity: Not on file    Allergies  Allergen Reactions   Amlodipine Swelling and Other (See Comments)    Lowered BP to much, lethargic    Coreg [Carvedilol]    Penicillin G Sodium Other (See Comments)    GI upset      Outpatient Medications Prior to Visit  Medication Sig Dispense Refill   Acetaminophen (TYLENOL PO) Take 2 tablets by mouth 2 (two) times daily as needed (pain).     apixaban (ELIQUIS) 5 MG TABS tablet Take 1 tablet (5 mg total) by mouth 2 (two) times daily. 180 tablet 1   gabapentin (NEURONTIN) 100 MG capsule Take 100 mg by mouth at bedtime as needed (pain).     hydrALAZINE (APRESOLINE) 25 MG tablet Take 3 tablets (75 mg total) by mouth 3 (three) times daily. 270 tablet 1   Levothyroxine Sodium (TIROSINT) 100 MCG CAPS Take 1 capsule (  100 mcg total) by mouth daily before breakfast. 30 capsule 0   metoprolol tartrate (LOPRESSOR) 100 MG tablet Take 100 mg by mouth 2 (two) times daily.     Thiamine HCl (VITAMIN B-1 PO) Take 1 tablet by mouth daily.     triamterene-hydrochlorothiazide (DYAZIDE) 37.5-25 MG capsule Take 1 capsule by mouth daily.     No facility-administered medications prior to visit.    Review of Systems  Constitutional:  Negative for chills, diaphoresis, fever, malaise/fatigue and weight loss.  HENT:  Negative for congestion.   Respiratory:  Positive for shortness of breath. Negative for cough, hemoptysis, sputum production and wheezing.    Cardiovascular:  Negative for chest pain, palpitations and leg swelling.     Objective:   Vitals:   05/14/22 1008  BP: 136/70  Pulse: (!) 55  Temp: 98.1 F (36.7 C)  TempSrc: Oral  SpO2: 94%  Weight: 233 lb 9.6 oz (106 kg)  Height: 5' 4.25" (1.632 m)   SpO2: 94 % (RA) O2 Device: None (Room air)  Physical Exam: General: Well-appearing, no acute distress HENT: [place], AT Eyes: EOMI, no scleral icterus Respiratory: Diminished right base but otherwise clear to auscultation bilaterally.  No crackles, wheezing or rales Cardiovascular: RRR, -M/R/G, no JVD Extremities:-Edema,-tenderness Neuro: AAO x4, CNII-XII grossly intact Psych: Normal mood, normal affect  Data Reviewed:  Imaging: CXR 03/19/22 - Increased right pleural effusion CXR 05/14/22 - Resolved right pleural effusion  PFT: 05/14/22 FVC 2.21 (78%) FEV1 1.80 (85%) Ratio 82  TLC 96% DLCO 87% Interpretation: Normal PFTs  Labs:  Thoracentesis 03/16/2022 Red turbid fluid with cell count 1990 N 17% L 72% M percent Protein ratio 3.5/6.4 LDH ratio 141/251 Culture - NGTD, final pending Cytology - in process Glucose - 125    Assessment & Plan:   Discussion: 77 year old female with HTN, hypothyroidism, atrial fibrillation on AC, s/p roux en y gastric bypass in 2009, CKD IIIb, ?diastolic heart failure who presents for hospital follow-up.   Right pleural effusion - resolved --Please call to [facility] to discuss management options for effusion  History of OSA Nocturnal hypoxemia --ORDER home sleep study --Please remove gel nails   Health Maintenance Immunization History  Administered Date(s) Administered   Influenza, High Dose Seasonal PF 01/31/2016   PFIZER(Purple Top)SARS-COV-2 Vaccination 05/15/2019, 06/05/2019   CT Lung Screen not qualified. Never smoker  Orders Placed This Encounter  Procedures   DG Chest 2 View    Standing Status:   Future    Number of Occurrences:   1     Standing Expiration Date:   05/15/2023    Order Specific Question:   Reason for Exam (SYMPTOM  OR DIAGNOSIS REQUIRED)    Answer:   follow up on pleural effusion    Order Specific Question:   Preferred imaging location?    Answer:   [facility]  No orders of the defined types were placed in this encounter.   No follow-ups on file.  I have spent a total time of 40-minutes on the day of the appointment reviewing prior documentation, coordinating care and discussing medical diagnosis and plan with the patient/family. Imaging, labs and tests included in this note have been reviewed and interpreted independently by me.  [place], MD [facility] 05/14/2022 10:23 AM  Office Number [phone]

## 2022-05-14 NOTE — Progress Notes (Incomplete)
Subjective:   [name]: Lynn Joseph GENDER: female DOB: March 16, 1946, MRN: [id number]  Chief Complaint  Patient presents with  . Follow-up    PFT performed today.  Pt states she has been doing okay since last visit.    Reason for Visit: Hospital follow-up  [name] is a 77 year old female with recent cholecystitis s/p biliary drain placed 11/8 in [place] and recent hospitalization for new onset atrial fibrillation and pericapsular hematoma on 11/16-11/21. During this hospitalization she underwent lap cholecystectomy on 98/33 without complications. After discharge on 11/22 she returned to the ED with complaints of shortness of breath with laying down. Reports weight gain of 15lbs and lower extremity swelling. Denies fevers, chills. CXR demonstrated right moderate effusion with atelectasis. She was given lasix and admitted to [facility]. PCCM consulted for thoracentesis and pleural fluid management.   Has annual history of respiratory illness requiring treatment and possible pleural effusion in Sept 2023. Denies any history of thoracentesis. CT CAP 03/08/22 with cardiomegaly and small/moderate right pleural effusion during this effusion. Has had shortness of breath, wheezing and cough.  Today she reports she overall feels she is stronger and doing better. Walking more often. Does feels she is not getting enough air and waking up at night. While in the hospital she reports needing to wear oxygen at night.   Social History: Denies any smoking history however lived with smoker and had wood burning stove used for heat and cooking in home for >30 years   I have personally reviewed patient's past medical/family/social history, allergies, current medications.  Past Medical History:  Diagnosis Date  . Anemia of chronic disease   . Chronic diarrhea   . Glaucoma   . HTN (hypertension)   . Hypertension   . Hypothyroid   . Obesity   . Skin cancer      Family History  Problem  Relation Age of Onset  . Hypertension Mother   . Thyroid disease Mother   . Stroke Mother   . Kidney disease Father   . Diabetes Father      Social History   Occupational History  . Not on file  Tobacco Use  . Smoking status: Never  . Smokeless tobacco: Never  Vaping Use  . Vaping Use: Never used  Substance and Sexual Activity  . Alcohol use: Not Currently    Comment: 1 glass of wine weekly  . Drug use: No  . Sexual activity: Not on file    Allergies  Allergen Reactions  . Amlodipine Swelling and Other (See Comments)    Lowered BP to much, lethargic   . Coreg [Carvedilol]   . Penicillin G Sodium Other (See Comments)    GI upset      Outpatient Medications Prior to Visit  Medication Sig Dispense Refill  . Acetaminophen (TYLENOL PO) Take 2 tablets by mouth 2 (two) times daily as needed (pain).    Marland Kitchen apixaban (ELIQUIS) 5 MG TABS tablet Take 1 tablet (5 mg total) by mouth 2 (two) times daily. 180 tablet 1  . gabapentin (NEURONTIN) 100 MG capsule Take 100 mg by mouth at bedtime as needed (pain).    . hydrALAZINE (APRESOLINE) 25 MG tablet Take 3 tablets (75 mg total) by mouth 3 (three) times daily. 270 tablet 1  . Levothyroxine Sodium (TIROSINT) 100 MCG CAPS Take 1 capsule (100 mcg total) by mouth daily before breakfast. 30 capsule 0  . metoprolol tartrate (LOPRESSOR) 100 MG tablet Take 100 mg by  mouth 2 (two) times daily.    . Thiamine HCl (VITAMIN B-1 PO) Take 1 tablet by mouth daily.    Marland Kitchen triamterene-hydrochlorothiazide (DYAZIDE) 37.5-25 MG capsule Take 1 capsule by mouth daily.     No facility-administered medications prior to visit.    Review of Systems  Constitutional:  Negative for chills, diaphoresis, fever, malaise/fatigue and weight loss.  HENT:  Negative for congestion.   Respiratory:  Positive for shortness of breath. Negative for cough, hemoptysis, sputum production and wheezing.   Cardiovascular:  Negative for chest pain, palpitations and leg swelling.      Objective:   Vitals:   05/14/22 1008  BP: 136/70  Pulse: (!) 55  Temp: 98.1 F (36.7 C)  TempSrc: Oral  SpO2: 94%  Weight: 233 lb 9.6 oz (106 kg)  Height: 5' 4.25" (1.632 m)   SpO2: 94 % (RA) O2 Device: None (Room air)  Physical Exam: General: Well-appearing, no acute distress HENT: [place], AT Eyes: EOMI, no scleral icterus Respiratory: Diminished right base but otherwise clear to auscultation bilaterally.  No crackles, wheezing or rales Cardiovascular: RRR, -M/R/G, no JVD Extremities:-Edema,-tenderness Neuro: AAO x4, CNII-XII grossly intact Psych: Normal mood, normal affect  Data Reviewed:  Imaging: CXR 03/19/22 - Increased right pleural effusion  PFT: None on file  Labs:    Thoracentesis 03/16/2022 Red turbid fluid with cell count 1990 N 17% L 72% M percent Protein ratio 3.5/6.4 LDH ratio 141/251 Culture - NGTD, final pending Cytology - in process Glucose - 125    Assessment & Plan:   Discussion: 77 year old female with HTN, hypothyroidism, atrial fibrillation on AC, s/p roux en y gastric bypass in 2009, CKD IIIb, ?diastolic heart failure who presents for hospital follow-up.  Right pleural effusion Unclear if this is new since her prior hospital admission in 11/16-11/21 for chronic with self-reported effusion since June 2023. Could be related to cardiac however effusion is unilateral.  Prior history of recurrent pneumonias may suggest hydropneumothorax which is likely based on thoracentesis performed on 03/16/2022. Negative pressure was noted during procedure and follow-up chest x-ray demonstrated recurrent of effusion immediately after.  Doubt infectious etiology.  No oxygen requirements.  Thoracentesis labs reviewed with borderline exudative effusion. Lymphocytic predominance. Cultures NTD. Cytology pending  --Continue regular aerobic exercise 3-5 days a week for 30 minutes --Refer to Cardiothoracic Surgery to discuss management options for  effusion --If you have worsening shortness of breath, please contact office and we can obtain repeat CXR to determine if another thoracentesis is needed  History of OSA Nocturnal hypoxemia Concerned for needing oxygen night while in hospital --Order ONO on room air --Consider home sleep study in the future  Nocturnal Hypoxemia --Discussed findings on ONO with patient and that she would qualify for oxygen --After discussion patient declined and would prefer re-evaluation at later time with either repeat ONO (with nail polish) vs home sleep study. --Keep next follow-up visit  Hyperthyroidism --Was advised to reduce medication to 100 mcg however only has 125 mcg tablets --Ordered 100 mcg dosing x 30 day supply. Advised to contact PCP for further refills  Health Maintenance Immunization History  Administered Date(s) Administered  . Influenza, High Dose Seasonal PF 01/31/2016  . PFIZER(Purple Top)SARS-COV-2 Vaccination 05/15/2019, 06/05/2019   CT Lung Screen not qualified. Never smoker  Orders Placed This Encounter  Procedures  . DG Chest 2 View    Standing Status:   Future    Number of Occurrences:   1    Standing Expiration Date:  05/15/2023    Order Specific Question:   Reason for Exam (SYMPTOM  OR DIAGNOSIS REQUIRED)    Answer:   follow up on pleural effusion    Order Specific Question:   Preferred imaging location?    Answer:   [facility]  No orders of the defined types were placed in this encounter.   No follow-ups on file.  I have spent a total time of 40-minutes on the day of the appointment reviewing prior documentation, coordinating care and discussing medical diagnosis and plan with the patient/family. Imaging, labs and tests included in this note have been reviewed and interpreted independently by me.  [place], MD [facility] 05/14/2022 10:21 AM  Office Number [phone]

## 2022-05-14 NOTE — Patient Instructions (Signed)
Right pleural effusion - resolved --Please call to [facility] to discuss management options for effusion  History of OSA Nocturnal hypoxemia --ORDER home sleep study --Please remove gel nails  Follow-up with me in 3 months

## 2022-05-14 NOTE — Patient Instructions (Signed)
Full PFT Performed Today minus Post-Spirometry.

## 2022-05-16 ENCOUNTER — Encounter (HOSPITAL_BASED_OUTPATIENT_CLINIC_OR_DEPARTMENT_OTHER): Payer: Self-pay | Admitting: Pulmonary Disease

## 2022-05-18 ENCOUNTER — Encounter: Payer: Medicare Other | Admitting: Thoracic Surgery (Cardiothoracic Vascular Surgery)

## 2022-05-22 ENCOUNTER — Ambulatory Visit: Payer: Medicare Other

## 2022-05-22 DIAGNOSIS — G4733 Obstructive sleep apnea (adult) (pediatric): Secondary | ICD-10-CM | POA: Diagnosis not present

## 2022-05-22 DIAGNOSIS — G4734 Idiopathic sleep related nonobstructive alveolar hypoventilation: Secondary | ICD-10-CM

## 2022-05-25 DIAGNOSIS — G4733 Obstructive sleep apnea (adult) (pediatric): Secondary | ICD-10-CM | POA: Diagnosis not present

## 2022-05-30 ENCOUNTER — Ambulatory Visit: Payer: Medicare Other | Attending: Cardiovascular Disease | Admitting: Cardiovascular Disease

## 2022-05-30 ENCOUNTER — Encounter: Payer: Self-pay | Admitting: Cardiovascular Disease

## 2022-05-30 VITALS — BP 140/60 | HR 55 | Ht 65.0 in | Wt 234.4 lb

## 2022-05-30 DIAGNOSIS — I10 Essential (primary) hypertension: Secondary | ICD-10-CM

## 2022-05-30 DIAGNOSIS — I480 Paroxysmal atrial fibrillation: Secondary | ICD-10-CM

## 2022-05-30 DIAGNOSIS — R002 Palpitations: Secondary | ICD-10-CM | POA: Diagnosis not present

## 2022-05-30 DIAGNOSIS — E785 Hyperlipidemia, unspecified: Secondary | ICD-10-CM | POA: Diagnosis not present

## 2022-05-30 DIAGNOSIS — N1832 Chronic kidney disease, stage 3b: Secondary | ICD-10-CM

## 2022-05-30 DIAGNOSIS — N289 Disorder of kidney and ureter, unspecified: Secondary | ICD-10-CM

## 2022-05-30 DIAGNOSIS — I701 Atherosclerosis of renal artery: Secondary | ICD-10-CM

## 2022-05-30 MED ORDER — HYDRALAZINE HCL 100 MG PO TABS: 100.0000 mg | ORAL_TABLET | Freq: Three times a day (TID) | ORAL | 3 refills | Status: DC

## 2022-05-30 MED ORDER — ROSUVASTATIN CALCIUM 5 MG PO TABS: 5.0000 mg | ORAL_TABLET | Freq: Every day | ORAL | 3 refills | Status: DC

## 2022-05-30 NOTE — Patient Instructions (Addendum)
Medication Instructions:  - Your physician has recommended you make the following change in your medication:   1) INCREASE Hydralazine up to 100 mg: - take 1 tablet by mouth three times a day  2) RESTART Crestor 5 mg: - take 1 tablet by mouth once daily  If you need a refill on your cardiac medications before your next appointment, please call your pharmacy.    Lab work: No new labs needed   Testing/Procedures: - A message has been forwarded to Dr. Tyrell Antonio nurse, Lattie Haw, to please reach out to you about setting up the Renal Angiogram.   Follow-Up: At [facility], you and your health needs are our priority.  As part of our continuing mission to provide you with exceptional heart care, we have created designated Provider Care Teams.  These Care Teams include your primary Cardiologist (physician) and Advanced Practice Providers (APPs -  Physician Assistants and Nurse Practitioners) who all work together to provide you with the care you need, when you need it.  You will need a follow up appointment in 6 months  Providers on your designated Care Team:   Murray Hodgkins, NP Christell Faith, PA-C Cadence Kathlen Mody, [place]  COVID-19 Vaccine Information can be found at: [email or web address].uk For questions related to vaccine distribution or appointments, please email [email or web address] or call [phone].

## 2022-05-30 NOTE — H&P (View-Only) (Signed)
Cardiology Office Note  Date:  05/30/2022   ID:  Lynn, Joseph 1946/04/02, MRN [id number]  PCP:  Velna Hatchet, MD   Chief Complaint  Patient presents with   1 month follow up     "Doing well." Medications reviewed by the patient verbally.     HPI:  [name] is a 77 year old woman with past medical history of Hypertension Thyroid disorder Apnea CRI PAD, renal artery stenosis PAF seen November 2023 in the setting of acute cholecystitis Presenting for follow-up of her diastolic CHF, paroxysmal atrial fibrillation, renal artery stenosis  Last seen by myself September 2023 Seen by one of our providers January 2024 Dr.Arida for renal artery stenosis  Was in [facility] hospitalized in November 2023 with new onset atrial fibrillation with RVR in the setting of acute cholecystitis. Following surgery with pleural effusion, thoracentesis performed.  She was treated with IV diltiazem and metoprolol and converted to sinus rhythm. She was started on anticoagulation with Eliquis given CHA2DS2-VASc score of 4.   Right renal artery ultrasound performed, right side was not well-visualized and possibly occluded with small size right kidney at 8.7 cm. Left renal artery was patent with normal size left kidney. She has chronic kidney disease with most recent creatinine of 1.58.   In follow-up today reports feeling relatively well Blood pressure elevated before her morning medication, Running in the mid [id number] range systolic after medications  Denies significant lower extremity swelling No significant chest pain or shortness of breath  Sedentary, no regular exercise program Runs dog rescue,   Echocardiogram November 2023 EF 55 to 60%  U/s in 10/21 Right Carotid: Velocities in the right ICA are consistent with a 1-39% stenosis.  Left Carotid: Velocities in the left ICA are consistent with a 40-59% stenosis.  EKG personally reviewed by myself on todays visit Normal sinus rhythm  rate 55 bpm no significant ST-T wave changes  PMH:   has a past medical history of Anemia of chronic disease, Chronic diarrhea, Glaucoma, HTN (hypertension), Hypertension, Hypothyroid, Obesity, and Skin cancer.  PSH:    Past Surgical History:  Procedure Laterality Date   CHOLECYSTECTOMY N/A 03/11/2022   Procedure: LAPAROSCOPIC CHOLECYSTECTOMY;  Surgeon: Dwan Bolt, MD;  Location: [place];  Service: General;  Laterality: N/A;   GASTRIC BYPASS     GASTRIC RESTRICTION SURGERY     IR REMOVAL BILIARY DRAIN  04/06/2022   IR SINUS/FIST TUBE CHK-NON GI  03/09/2022   TUBAL LIGATION      Current Outpatient Medications  Medication Sig Dispense Refill   Acetaminophen (TYLENOL PO) Take 2 tablets by mouth 2 (two) times daily as needed (pain).     apixaban (ELIQUIS) 5 MG TABS tablet Take 1 tablet (5 mg total) by mouth 2 (two) times daily. 180 tablet 1   gabapentin (NEURONTIN) 100 MG capsule Take 100 mg by mouth at bedtime as needed (pain).     hydrALAZINE (APRESOLINE) 25 MG tablet Take 3 tablets (75 mg total) by mouth 3 (three) times daily. 270 tablet 1   Levothyroxine Sodium (TIROSINT) 100 MCG CAPS Take 1 capsule (100 mcg total) by mouth daily before breakfast. 30 capsule 0   metoprolol tartrate (LOPRESSOR) 100 MG tablet Take 100 mg by mouth 2 (two) times daily.     Thiamine HCl (VITAMIN B-1 PO) Take 1 tablet by mouth daily.     triamterene-hydrochlorothiazide (DYAZIDE) 37.5-25 MG capsule Take 1 capsule by mouth daily.     No current facility-administered medications for this visit.  Allergies:   Amlodipine, Coreg [carvedilol], and Penicillin g sodium   Social History:  The patient  reports that she has never smoked. She has never used smokeless tobacco. She reports that she does not currently use alcohol. She reports that she does not use drugs.   Family History:   family history includes Diabetes in her father; Hypertension in her mother; Kidney disease in her father; Stroke in her mother;  Thyroid disease in her mother.    Review of Systems: Review of Systems  Constitutional: Negative.   HENT: Negative.    Respiratory: Negative.    Cardiovascular:  Positive for chest pain and palpitations.  Gastrointestinal: Negative.   Musculoskeletal: Negative.   Neurological: Negative.   Psychiatric/Behavioral: Negative.    All other systems reviewed and are negative.   PHYSICAL EXAM: VS:  BP (!) 140/60 (BP Location: Left Arm, Patient Position: Sitting, Cuff Size: Large)   Pulse (!) 55   Ht '5\' 5"'$  (1.651 m)   Wt 234 lb 6 oz (106.3 kg)   SpO2 97%   BMI 39.00 kg/m  , BMI Body mass index is 39 kg/m. Constitutional:  oriented to person, place, and time. No distress.  HENT:  Head: Grossly normal Eyes:  no discharge. No scleral icterus.  Neck: No JVD, no carotid bruits  Cardiovascular: Regular rate and rhythm, no murmurs appreciated Pulmonary/Chest: Clear to auscultation bilaterally, no wheezes or rails Abdominal: Soft.  no distension.  no tenderness.  Musculoskeletal: Normal range of motion Neurological:  normal muscle tone. Coordination normal. No atrophy Skin: Skin warm and dry Psychiatric: normal affect, pleasant  Recent Labs: 03/14/2022: B Natriuretic Peptide 371.7; TSH 0.328 03/15/2022: ALT 18 03/16/2022: BUN 27; Creatinine, Ser 1.58; Hemoglobin 8.6; Magnesium 2.2; Platelets 277; Potassium 4.0; Sodium 141    Lipid Panel Lab Results  Component Value Date   CHOL 160 10/28/2018   HDL 55 10/28/2018   LDLCALC 92 10/28/2018   TRIG 64 10/28/2018      Wt Readings from Last 3 Encounters:  05/30/22 234 lb 6 oz (106.3 kg)  05/14/22 233 lb 9.6 oz (106 kg)  05/02/22 233 lb 8 oz (105.9 kg)      ASSESSMENT AND PLAN:  Problem List Items Addressed This Visit     CKD (chronic kidney disease) stage 3, GFR 30-59 ml/min (HCC)   Other Visit Diagnoses     Paroxysmal atrial fibrillation ([place])    -  Primary   Renal artery stenosis (HCC)       Primary hypertension        Hyperlipidemia, unspecified hyperlipidemia type       Palpitations       Renal insufficiency         Chest pain Denies significant chest pain Normal ejection fraction November 2023 Low risk stress test September 2023 with no significant ischemia Will recommend she restart her Crestor 5 mg daily  Paroxysmal tachycardia/palpitations Denies significant tachypalpitations, prior Zio monitor with no significant arrhythmia Continue metoprolol tartrate 100 twice daily  Hyperlipidemia We have recommended she restart Crestor 5 mg daily  Chronic renal sufficiency/renal artery stenosis Concern for stenosis on the right, not well-visualized on ultrasound Will help facilitate angiogram, reports she is ready to schedule  Essential hypertension Blood pressure with improved control on current regiment, Recommend she increase hydralazine up to 100 mg 3 times daily rather than 75 3 times daily  Obesity We have encouraged continued exercise, careful diet management in an effort to lose weight.    Total encounter  time more than 40 minutes  Greater than 50% was spent in counseling and coordination of care with the patient    Signed, Esmond Plants, M.D., Ph.D. [place], [place]

## 2022-05-30 NOTE — Progress Notes (Signed)
Cardiology Office Note  Date:  05/30/2022   ID:  Lynn Joseph, DOB 12/24/1945, MRN [id number]  PCP:  Holwerda, Scott, MD   Chief Complaint  Patient presents with   1 month follow up     "Doing well." Medications reviewed by the patient verbally.     HPI:  Lynn Joseph is a 76-year-old woman with past medical history of Hypertension Thyroid disorder Apnea CRI PAD, renal artery stenosis PAF seen November 2023 in the setting of acute cholecystitis Presenting for follow-up of her diastolic CHF, paroxysmal atrial fibrillation, renal artery stenosis  Last seen by myself September 2023 Seen by one of our providers January 2024 Dr.Arida for renal artery stenosis  Was in [place] hospitalized in November 2023 with new onset atrial fibrillation with RVR in the setting of acute cholecystitis. Following surgery with pleural effusion, thoracentesis performed.  She was treated with IV diltiazem and metoprolol and converted to sinus rhythm. She was started on anticoagulation with Eliquis given CHA2DS2-VASc score of 4.   Right renal artery ultrasound performed, right side was not well-visualized and possibly occluded with small size right kidney at 8.7 cm. Left renal artery was patent with normal size left kidney. She has chronic kidney disease with most recent creatinine of 1.58.   In follow-up today reports feeling relatively well Blood pressure elevated before her morning medication, Running in the mid 130 range systolic after medications  Denies significant lower extremity swelling No significant chest pain or shortness of breath  Sedentary, no regular exercise program Runs dog rescue,   Echocardiogram November 2023 EF 55 to 60%  U/s in 10/21 Right Carotid: Velocities in the right ICA are consistent with a 1-39% stenosis.  Left Carotid: Velocities in the left ICA are consistent with a 40-59% stenosis.  EKG personally reviewed by myself on todays visit Normal sinus rhythm  rate 55 bpm no significant ST-T wave changes  PMH:   has a past medical history of Anemia of chronic disease, Chronic diarrhea, Glaucoma, HTN (hypertension), Hypertension, Hypothyroid, Obesity, and Skin cancer.  PSH:    Past Surgical History:  Procedure Laterality Date   CHOLECYSTECTOMY N/A 03/11/2022   Procedure: LAPAROSCOPIC CHOLECYSTECTOMY;  Surgeon: Allen, Shelby L, MD;  Location: [facility];  Service: General;  Laterality: N/A;   GASTRIC BYPASS     GASTRIC RESTRICTION SURGERY     IR REMOVAL BILIARY DRAIN  04/06/2022   IR SINUS/FIST TUBE CHK-NON GI  03/09/2022   TUBAL LIGATION      Current Outpatient Medications  Medication Sig Dispense Refill   Acetaminophen (TYLENOL PO) Take 2 tablets by mouth 2 (two) times daily as needed (pain).     apixaban (ELIQUIS) 5 MG TABS tablet Take 1 tablet (5 mg total) by mouth 2 (two) times daily. 180 tablet 1   gabapentin (NEURONTIN) 100 MG capsule Take 100 mg by mouth at bedtime as needed (pain).     hydrALAZINE (APRESOLINE) 25 MG tablet Take 3 tablets (75 mg total) by mouth 3 (three) times daily. 270 tablet 1   Levothyroxine Sodium (TIROSINT) 100 MCG CAPS Take 1 capsule (100 mcg total) by mouth daily before breakfast. 30 capsule 0   metoprolol tartrate (LOPRESSOR) 100 MG tablet Take 100 mg by mouth 2 (two) times daily.     Thiamine HCl (VITAMIN B-1 PO) Take 1 tablet by mouth daily.     triamterene-hydrochlorothiazide (DYAZIDE) 37.5-25 MG capsule Take 1 capsule by mouth daily.     No current facility-administered medications for this visit.      Allergies:   Amlodipine, Coreg [carvedilol], and Penicillin g sodium   Social History:  The patient  reports that she has never smoked. She has never used smokeless tobacco. She reports that she does not currently use alcohol. She reports that she does not use drugs.   Family History:   family history includes Diabetes in her father; Hypertension in her mother; Kidney disease in her father; Stroke in her mother;  Thyroid disease in her mother.    Review of Systems: Review of Systems  Constitutional: Negative.   HENT: Negative.    Respiratory: Negative.    Cardiovascular:  Positive for chest pain and palpitations.  Gastrointestinal: Negative.   Musculoskeletal: Negative.   Neurological: Negative.   Psychiatric/Behavioral: Negative.    All other systems reviewed and are negative.   PHYSICAL EXAM: VS:  BP (!) 140/60 (BP Location: Left Arm, Patient Position: Sitting, Cuff Size: Large)   Pulse (!) 55   Ht 5' 5" (1.651 m)   Wt 234 lb 6 oz (106.3 kg)   SpO2 97%   BMI 39.00 kg/m  , BMI Body mass index is 39 kg/m. Constitutional:  oriented to person, place, and time. No distress.  HENT:  Head: Grossly normal Eyes:  no discharge. No scleral icterus.  Neck: No JVD, no carotid bruits  Cardiovascular: Regular rate and rhythm, no murmurs appreciated Pulmonary/Chest: Clear to auscultation bilaterally, no wheezes or rails Abdominal: Soft.  no distension.  no tenderness.  Musculoskeletal: Normal range of motion Neurological:  normal muscle tone. Coordination normal. No atrophy Skin: Skin warm and dry Psychiatric: normal affect, pleasant  Recent Labs: 03/14/2022: B Natriuretic Peptide 371.7; TSH 0.328 03/15/2022: ALT 18 03/16/2022: BUN 27; Creatinine, Ser 1.58; Hemoglobin 8.6; Magnesium 2.2; Platelets 277; Potassium 4.0; Sodium 141    Lipid Panel Lab Results  Component Value Date   CHOL 160 10/28/2018   HDL 55 10/28/2018   LDLCALC 92 10/28/2018   TRIG 64 10/28/2018      Wt Readings from Last 3 Encounters:  05/30/22 234 lb 6 oz (106.3 kg)  05/14/22 233 lb 9.6 oz (106 kg)  05/02/22 233 lb 8 oz (105.9 kg)      ASSESSMENT AND PLAN:  Problem List Items Addressed This Visit     CKD (chronic kidney disease) stage 3, GFR 30-59 ml/min (HCC)   Other Visit Diagnoses     Paroxysmal atrial fibrillation (HCC)    -  Primary   Renal artery stenosis (HCC)       Primary hypertension        Hyperlipidemia, unspecified hyperlipidemia type       Palpitations       Renal insufficiency         Chest pain Denies significant chest pain Normal ejection fraction November 2023 Low risk stress test September 2023 with no significant ischemia Will recommend she restart her Crestor 5 mg daily  Paroxysmal tachycardia/palpitations Denies significant tachypalpitations, prior Zio monitor with no significant arrhythmia Continue metoprolol tartrate 100 twice daily  Hyperlipidemia We have recommended she restart Crestor 5 mg daily  Chronic renal sufficiency/renal artery stenosis Concern for stenosis on the right, not well-visualized on ultrasound Will help facilitate angiogram, reports she is ready to schedule  Essential hypertension Blood pressure with improved control on current regiment, Recommend she increase hydralazine up to 100 mg 3 times daily rather than 75 3 times daily  Obesity We have encouraged continued exercise, careful diet management in an effort to lose weight.    Total encounter   time more than 40 minutes  Greater than 50% was spent in counseling and coordination of care with the patient    Signed, Tim Annastacia Duba, M.D., Ph.D. [facility], [place] [phone] 

## 2022-06-01 ENCOUNTER — Telehealth: Payer: Self-pay | Admitting: *Deleted

## 2022-06-01 DIAGNOSIS — I701 Atherosclerosis of renal artery: Secondary | ICD-10-CM

## 2022-06-01 DIAGNOSIS — I10 Essential (primary) hypertension: Secondary | ICD-10-CM

## 2022-06-01 NOTE — Telephone Encounter (Signed)
Called the patient to schedule her for the Renal Angiography. Procedure has been scheduled for 2/28 at 10:30 with arrival by 8:30. She will have labs completed by 2/23 at [facility]. She has verbalized her understanding of the procedure and instructions. They have also been sent to her MyChart.         Peripheral Catheterization   You are scheduled for Peripheral Catheterization on Wednesday, February 28 with Dr. Kathlyn Sacramento.  1. Please arrive at the Main Entrance A at [facility]: [place], [place] [address] on February 28 at 8:30 AM (This time is two hours before your procedure to ensure your preparation). Free valet parking service is available. You will check in at ADMITTING. The support person will be asked to wait in the waiting room.  It is OK to have someone drop you off and come back when you are ready to be discharged.        Special note: Every effort is made to have your procedure done on time. Please understand that emergencies sometimes delay scheduled procedures.   . 2. Diet: Do not eat solid foods after midnight.  You may have clear liquids until 5 AM the day of the procedure.  3. Labs:  Your provider would like for you to return by 2/28 to have the following labs drawn: CBC and BMET.   Please go to the [facility] entrance and check in at the front desk.  You do not need an appointment.  They are open from 7am-6 pm.  You will not need to be fasting.  4. Medication instructions in preparation for your procedure: Hold Eliquis two days prior and the morning of the procedure (hold 2/26 and 2/27)  On the morning of your procedure, take Aspirin 81 mg and any morning medicines NOT listed above.  You may use sips of water.  5. Plan to go home the same day, you will only stay overnight if medically necessary. 6. You MUST have a responsible adult to drive you home. 7. An adult MUST be with you the first 24 hours after you arrive home. 8. Bring a  current list of your medications, and the last time and date medication taken. 9. Bring ID and current insurance cards. 10.Please wear clothes that are easy to get on and off and wear slip-on shoes.  Thank you for allowing [name] to care for you!   -- [facility]

## 2022-06-02 ENCOUNTER — Other Ambulatory Visit: Payer: Self-pay | Admitting: Cardiology

## 2022-06-04 ENCOUNTER — Telehealth: Payer: Self-pay | Admitting: Pulmonary Disease

## 2022-06-04 DIAGNOSIS — G4733 Obstructive sleep apnea (adult) (pediatric): Secondary | ICD-10-CM

## 2022-06-04 NOTE — Telephone Encounter (Signed)
Yes

## 2022-06-04 NOTE — Telephone Encounter (Signed)
[facility] Telephone Encounter  Reviewed home sleep study results. Discussed options including CPAP, oral compliance and weight loss. HST 05/23/22 - Moderate OSA AHI 19.5 Nadir SpO2 82%  After discussion patient wishes to pursue CPAP.  [name] - please order new start auto CPAP 5-20 cm H20 with supplies  Lynn Joseph - please cancel recall for April and schedule patient for follow-up with me May (mid or end of the month)

## 2022-06-05 ENCOUNTER — Other Ambulatory Visit
Admission: RE | Admit: 2022-06-05 | Discharge: 2022-06-05 | Disposition: A | Discharge status: Home / Self Care | Payer: Medicare Other | Attending: Cardiovascular Disease | Admitting: Cardiovascular Disease

## 2022-06-05 DIAGNOSIS — I701 Atherosclerosis of renal artery: Secondary | ICD-10-CM | POA: Insufficient documentation

## 2022-06-05 DIAGNOSIS — I10 Essential (primary) hypertension: Secondary | ICD-10-CM | POA: Diagnosis not present

## 2022-06-05 LAB — BASIC METABOLIC PANEL
Anion gap: 11 (ref 5–15)
BUN: 39 mg/dL — ABNORMAL HIGH (ref 8–23)
CO2: 21 mmol/L — ABNORMAL LOW (ref 22–32)
Calcium: 8.9 mg/dL (ref 8.9–10.3)
Chloride: 107 mmol/L (ref 98–111)
Creatinine, Ser: 1.65 mg/dL — ABNORMAL HIGH (ref 0.44–1.00)
GFR, Estimated: 32 mL/min — ABNORMAL LOW (ref >60)
Glucose, Bld: 97 mg/dL (ref 70–99)
Potassium: 4.9 mmol/L (ref 3.5–5.1)
Sodium: 139 mmol/L (ref 135–145)

## 2022-06-05 LAB — CBC
HCT: 28.4 % — ABNORMAL LOW (ref 36.0–46.0)
Hemoglobin: 8.9 g/dL — ABNORMAL LOW (ref 12.0–15.0)
MCH: 28.1 pg (ref 26.0–34.0)
MCHC: 31.3 g/dL (ref 30.0–36.0)
MCV: 89.6 fL (ref 80.0–100.0)
Platelets: 189 10*3/uL (ref 150–400)
RBC: 3.17 MIL/uL — ABNORMAL LOW (ref 3.87–5.11)
RDW: 14.7 % (ref 11.5–15.5)
WBC: 6.7 10*3/uL (ref 4.0–10.5)
nRBC: 0 % (ref 0.0–0.2)

## 2022-06-05 NOTE — Telephone Encounter (Signed)
Placed cpap order per Dr Rosario Adie orders after she talked to the patient. Nothing further needed

## 2022-06-07 ENCOUNTER — Telehealth: Payer: Self-pay

## 2022-06-07 ENCOUNTER — Telehealth: Payer: Self-pay | Admitting: *Deleted

## 2022-06-07 NOTE — Telephone Encounter (Signed)
-----   Message from Wellington Hampshire, MD sent at 06/07/2022 11:26 AM EST ----- She has underlying chronic kidney disease with a GFR of 32.  She needs 4 hours of hydration before the procedure. In addition, she has chronic anemia that should be addressed by her primary care physician.

## 2022-06-07 NOTE — Telephone Encounter (Signed)
Patient has been made aware of results and will arrive early for hydration. Cath lab made aware.  She stated that her PCP follows her anemia.

## 2022-06-07 NOTE — Telephone Encounter (Signed)
Opened in error. Lynn Joseph

## 2022-06-18 ENCOUNTER — Telehealth: Payer: Self-pay | Admitting: *Deleted

## 2022-06-18 NOTE — Telephone Encounter (Signed)
Renal Angiogram scheduled at [facility] for: Wednesday June 20, 2022 10:30 AM Arrival time and place: [place] Entrance A at: 6 AM-pre-procedure hydration  Nothing to eat after midnight prior to procedure, clear liquids until 5 AM day of procedure.  Medication instructions: -Hold:  Eliquis-none 06/18/22 until post procedure  Triamterene/HCT-day before and day of procedure-per protocol GFR 32 -Other usual morning medications can be taken with sips of water including aspirin 81 mg.  Confirmed patient has responsible adult to drive home post procedure and be with patient first 24 hours after arriving home.  Patient reports no new symptoms concerning for COVID-19 in the past 10 days.  Reviewed procedure instructions/pre-procedure hydration with patient.

## 2022-06-20 ENCOUNTER — Encounter (HOSPITAL_COMMUNITY)
Admission: RE | Disposition: A | Discharge status: Home / Self Care | Payer: Self-pay | Source: Home / Self Care | Attending: Cardiovascular Disease

## 2022-06-20 ENCOUNTER — Ambulatory Visit (HOSPITAL_COMMUNITY)
Admission: RE | Admit: 2022-06-20 | Discharge: 2022-06-20 | Disposition: A | Discharge status: Home / Self Care | Payer: Medicare Other | Attending: Cardiovascular Disease | Admitting: Cardiovascular Disease

## 2022-06-20 DIAGNOSIS — Z79899 Other long term (current) drug therapy: Secondary | ICD-10-CM | POA: Insufficient documentation

## 2022-06-20 DIAGNOSIS — I130 Hypertensive heart and chronic kidney disease with heart failure and stage 1 through stage 4 chronic kidney disease, or unspecified chronic kidney disease: Secondary | ICD-10-CM | POA: Diagnosis not present

## 2022-06-20 DIAGNOSIS — Z6839 Body mass index (BMI) 39.0-39.9, adult: Secondary | ICD-10-CM | POA: Diagnosis not present

## 2022-06-20 DIAGNOSIS — E669 Obesity, unspecified: Secondary | ICD-10-CM | POA: Diagnosis not present

## 2022-06-20 DIAGNOSIS — I480 Paroxysmal atrial fibrillation: Secondary | ICD-10-CM | POA: Diagnosis not present

## 2022-06-20 DIAGNOSIS — N183 Chronic kidney disease, stage 3 unspecified: Secondary | ICD-10-CM | POA: Diagnosis not present

## 2022-06-20 DIAGNOSIS — R002 Palpitations: Secondary | ICD-10-CM | POA: Insufficient documentation

## 2022-06-20 DIAGNOSIS — R0681 Apnea, not elsewhere classified: Secondary | ICD-10-CM | POA: Insufficient documentation

## 2022-06-20 DIAGNOSIS — I701 Atherosclerosis of renal artery: Secondary | ICD-10-CM | POA: Diagnosis not present

## 2022-06-20 DIAGNOSIS — E785 Hyperlipidemia, unspecified: Secondary | ICD-10-CM | POA: Diagnosis not present

## 2022-06-20 DIAGNOSIS — I5032 Chronic diastolic (congestive) heart failure: Secondary | ICD-10-CM | POA: Insufficient documentation

## 2022-06-20 DIAGNOSIS — R079 Chest pain, unspecified: Secondary | ICD-10-CM | POA: Diagnosis not present

## 2022-06-20 HISTORY — PX: RENAL ANGIOGRAPHY: CATH118260

## 2022-06-20 SURGERY — RENAL ANGIOGRAPHY
Anesthesia: LOCAL

## 2022-06-20 MED ORDER — SODIUM CHLORIDE 0.9% FLUSH: 3.0000 mL | Freq: Two times a day (BID) | INTRAVENOUS | Status: DC

## 2022-06-20 MED ORDER — ONDANSETRON HCL 4 MG/2ML IJ SOLN: 4.0000 mg | Freq: Four times a day (QID) | INTRAMUSCULAR | Status: DC | PRN

## 2022-06-20 MED ORDER — HYDRALAZINE HCL 20 MG/ML IJ SOLN: 5.0000 mg | INTRAMUSCULAR | Status: DC | PRN

## 2022-06-20 MED ORDER — SODIUM CHLORIDE 0.9 % IV SOLN: 250.0000 mL | INTRAVENOUS | Status: DC | PRN

## 2022-06-20 MED ORDER — IODIXANOL 320 MG/ML IV SOLN
INTRAVENOUS | Status: DC | PRN
  Administered 2022-06-20: 40 mL

## 2022-06-20 MED ORDER — SODIUM CHLORIDE 0.9 % WEIGHT BASED INFUSION: 3.0000 mL/kg/h | INTRAVENOUS | Status: AC

## 2022-06-20 MED ORDER — MIDAZOLAM HCL 2 MG/2ML IJ SOLN
INTRAMUSCULAR | Status: AC
  Filled 2022-06-20: qty 2

## 2022-06-20 MED ORDER — MIDAZOLAM HCL 2 MG/2ML IJ SOLN
INTRAMUSCULAR | Status: DC | PRN
  Administered 2022-06-20: 1 mg via INTRAVENOUS

## 2022-06-20 MED ORDER — SODIUM CHLORIDE 0.9 % IV SOLN: INTRAVENOUS | Status: AC

## 2022-06-20 MED ORDER — HEPARIN (PORCINE) IN NACL 1000-0.9 UT/500ML-% IV SOLN
INTRAVENOUS | Status: DC | PRN
  Administered 2022-06-20 (×2): 500 mL

## 2022-06-20 MED ORDER — ASPIRIN 81 MG PO CHEW: 81.0000 mg | CHEWABLE_TABLET | ORAL | Status: DC

## 2022-06-20 MED ORDER — SODIUM CHLORIDE 0.9 % WEIGHT BASED INFUSION: 1.0000 mL/kg/h | INTRAVENOUS | Status: DC

## 2022-06-20 MED ORDER — SODIUM CHLORIDE 0.9 % WEIGHT BASED INFUSION
3.0000 mL/kg/h | INTRAVENOUS | Status: DC
  Administered 2022-06-20: 3 mL/kg/h via INTRAVENOUS

## 2022-06-20 MED ORDER — LIDOCAINE HCL (PF) 1 % IJ SOLN
INTRAMUSCULAR | Status: DC | PRN
  Administered 2022-06-20: 20 mL

## 2022-06-20 MED ORDER — SODIUM CHLORIDE 0.9 % WEIGHT BASED INFUSION
1.0000 mL/kg/h | INTRAVENOUS | Status: DC
  Administered 2022-06-20: 1 mL/kg/h via INTRAVENOUS

## 2022-06-20 MED ORDER — FENTANYL CITRATE (PF) 100 MCG/2ML IJ SOLN
INTRAMUSCULAR | Status: DC | PRN
  Administered 2022-06-20: 25 ug via INTRAVENOUS

## 2022-06-20 MED ORDER — ACETAMINOPHEN 325 MG PO TABS: 650.0000 mg | ORAL_TABLET | ORAL | Status: DC | PRN

## 2022-06-20 MED ORDER — LIDOCAINE HCL (PF) 1 % IJ SOLN
INTRAMUSCULAR | Status: AC
  Filled 2022-06-20: qty 30

## 2022-06-20 MED ORDER — FENTANYL CITRATE (PF) 100 MCG/2ML IJ SOLN
INTRAMUSCULAR | Status: AC
  Filled 2022-06-20: qty 2

## 2022-06-20 MED ORDER — SODIUM CHLORIDE 0.9% FLUSH: 3.0000 mL | INTRAVENOUS | Status: DC | PRN

## 2022-06-20 SURGICAL SUPPLY — 13 items
CATH ANGIO 5F PIGTAIL 65CM (CATHETERS) IMPLANT
CATH SOS OMNI O 5F 80CM (CATHETERS) IMPLANT
DEVICE CLOSURE MYNXGRIP 6/7F (Vascular Products) IMPLANT
KIT MICROPUNCTURE NIT STIFF (SHEATH) IMPLANT
KIT PV (KITS) ×1 IMPLANT
SHEATH PINNACLE 6F 10CM (SHEATH) IMPLANT
SHEATH PROBE COVER 6X72 (BAG) IMPLANT
STOPCOCK MORSE 400PSI 3WAY (MISCELLANEOUS) IMPLANT
SYR MEDRAD MARK 7 150ML (SYRINGE) ×1 IMPLANT
TRANSDUCER W/STOPCOCK (MISCELLANEOUS) ×1 IMPLANT
TRAY PV CATH (CUSTOM PROCEDURE TRAY) ×1 IMPLANT
TUBING CIL FLEX 10 FLL-RA (TUBING) IMPLANT
WIRE HITORQ VERSACORE ST 145CM (WIRE) IMPLANT

## 2022-06-20 NOTE — Progress Notes (Signed)
Up and walked and tolerated well; right groin stable, no bleeding or hematoma 

## 2022-06-20 NOTE — Interval H&P Note (Signed)
History and Physical Interval Note:  06/20/2022 10:26 AM  Lynn Joseph  has presented today for surgery, with the diagnosis of Renal stenosis.  The various methods of treatment have been discussed with the patient and family. After consideration of risks, benefits and other options for treatment, the patient has consented to  Procedure(s): RENAL ANGIOGRAPHY (N/A) as a surgical intervention.  The patient's history has been reviewed, patient examined, no change in status, stable for surgery.  I have reviewed the patient's chart and labs.  Questions were answered to the patient's satisfaction.     Kathlyn Sacramento

## 2022-06-21 ENCOUNTER — Encounter (HOSPITAL_COMMUNITY): Payer: Self-pay | Admitting: Cardiovascular Disease

## 2022-06-25 ENCOUNTER — Encounter (HOSPITAL_COMMUNITY): Payer: Self-pay | Admitting: Cardiovascular Disease

## 2022-07-03 ENCOUNTER — Encounter: Payer: Self-pay | Admitting: *Deleted

## 2022-07-10 ENCOUNTER — Encounter: Payer: Self-pay | Admitting: Cardiovascular Disease

## 2022-07-10 ENCOUNTER — Ambulatory Visit: Payer: Medicare Other | Attending: Cardiovascular Disease | Admitting: Cardiovascular Disease

## 2022-07-10 VITALS — BP 144/70 | HR 62 | Ht 65.0 in | Wt 234.0 lb

## 2022-07-10 DIAGNOSIS — E785 Hyperlipidemia, unspecified: Secondary | ICD-10-CM | POA: Insufficient documentation

## 2022-07-10 DIAGNOSIS — I701 Atherosclerosis of renal artery: Secondary | ICD-10-CM | POA: Diagnosis not present

## 2022-07-10 DIAGNOSIS — I480 Paroxysmal atrial fibrillation: Secondary | ICD-10-CM | POA: Insufficient documentation

## 2022-07-10 MED ORDER — CARVEDILOL 12.5 MG PO TABS: 12.5000 mg | ORAL_TABLET | Freq: Two times a day (BID) | ORAL | 3 refills | Status: DC

## 2022-07-10 NOTE — Progress Notes (Unsigned)
Cardiology Office Note   Date:  07/10/2022   ID:  DEEPTI HANDCOCK, DOB [birth year], MRN [id number]  PCP:  Velna Hatchet, MD  Cardiologist: Dr. Rockey Situ.  Chief Complaint  Patient presents with   Other    Post PAD/2 Month f/u c/o fluctuating BP. Pt took her BP medication about 15 mins ago. Meds reviewed verbally with pt.      History of Present Illness: Lynn Joseph is a 77 y.o. female who is here today after recent renal artery angiography.   She has known history of paroxysmal atrial fibrillation on long-term anticoagulation, hypertension, hypothyroidism and chronic kidney disease. She was hospitalized in November 2023 with new onset atrial fibrillation with RVR in the setting of acute cholecystitis.  She was treated with IV diltiazem and metoprolol and converted to sinus rhythm.  She was started on anticoagulation with Eliquis given CHA2DS2-VASc score of 4.  She had difficult to control hypertension since then.  Due to that, she was referred for renal artery duplex.  Right renal artery was not well-visualized and possibly occluded with small size right kidney at 8.7 cm.  Left renal artery was patent with normal size left kidney.  She has chronic kidney disease with most recent creatinine of 1.58.  She underwent renal artery angiography last month which showed normal left renal artery with mild nonobstructive stenosis affecting the right renal artery.  No revascularization was needed. She reports having increased lower extremity edema after the procedure and she took a few doses of Lasix that she had before.  Symptoms then resolved.   Past Medical History:  Diagnosis Date   Anemia of chronic disease    Chronic diarrhea    Glaucoma    HTN (hypertension)    Hypertension    Hypothyroid    Obesity    Skin cancer     Past Surgical History:  Procedure Laterality Date   CHOLECYSTECTOMY N/A 03/11/2022   Procedure: LAPAROSCOPIC CHOLECYSTECTOMY;  Surgeon: Dwan Bolt, MD;   Location: [facility];  Service: General;  Laterality: N/A;   GASTRIC BYPASS     GASTRIC RESTRICTION SURGERY     IR REMOVAL BILIARY DRAIN  04/06/2022   IR SINUS/FIST TUBE CHK-NON GI  03/09/2022   RENAL ANGIOGRAPHY N/A 06/20/2022   Procedure: RENAL ANGIOGRAPHY;  Surgeon: Wellington Hampshire, MD;  Location: [facility];  Service: Cardiovascular;  Laterality: N/A;   TUBAL LIGATION       Current Outpatient Medications  Medication Sig Dispense Refill   Acetaminophen (TYLENOL PO) Take 2 tablets by mouth 2 (two) times daily as needed (pain).     apixaban (ELIQUIS) 5 MG TABS tablet Take 1 tablet (5 mg total) by mouth 2 (two) times daily. 180 tablet 1   Calcium-Magnesium-Zinc (CALCIUM-MAGNESUIUM-ZINC PO) Take 1 tablet by mouth daily. Calcium 600 mg Vit b6     hydrALAZINE (APRESOLINE) 100 MG tablet Take 1 tablet (100 mg total) by mouth 3 (three) times daily. 270 tablet 3   Levothyroxine Sodium (TIROSINT) 100 MCG CAPS Take 1 capsule (100 mcg total) by mouth daily before breakfast. 30 capsule 0   metoprolol tartrate (LOPRESSOR) 100 MG tablet Take 100 mg by mouth 2 (two) times daily.     Multiple Vitamins-Minerals (MULTIVITAMIN WITH MINERALS) tablet Take 1 tablet by mouth daily.     rosuvastatin (CRESTOR) 5 MG tablet Take 1 tablet (5 mg total) by mouth daily. 90 tablet 3   Thiamine HCl (VITAMIN B-1 PO) Take 100 mg by  mouth daily.     triamterene-hydrochlorothiazide (DYAZIDE) 37.5-25 MG capsule Take 1 capsule by mouth daily.     vitamin B-12 (CYANOCOBALAMIN) 50 MCG tablet Take 50 mcg by mouth daily.     No current facility-administered medications for this visit.    Allergies:   Amlodipine, Coreg [carvedilol], and Penicillin g sodium    Social History:  The patient  reports that she has never smoked. She has never used smokeless tobacco. She reports that she does not currently use alcohol. She reports that she does not use drugs.   Family History:  The patient's family history includes Diabetes in  her father; Hypertension in her mother; Kidney disease in her father; Stroke in her mother; Thyroid disease in her mother.    ROS:  Please see the history of present illness.   Otherwise, review of systems are positive for none.   All other systems are reviewed and negative.    PHYSICAL EXAM: VS:  BP (!) 144/70 (BP Location: Left Arm, Patient Position: Sitting, Cuff Size: Large) Comment: Pt took meds about 15 mins ago  Pulse 62   Ht 5\' 5"  (1.651 m)   Wt 234 lb (106.1 kg)   SpO2 98%   BMI 38.94 kg/m  , BMI Body mass index is 38.94 kg/m. GEN: Well nourished, well developed, in no acute distress  HEENT: normal  Neck: no JVD, carotid bruits, or masses Cardiac: RRR; no murmurs, rubs, or gallops,no edema  Respiratory:  clear to auscultation bilaterally, normal work of breathing GI: soft, nontender, nondistended, + BS MS: no deformity or atrophy  Skin: warm and dry, no rash Neuro:  Strength and sensation are intact Psych: euthymic mood, full affect No groin hematoma   EKG:  EKG is not ordered today.    Recent Labs: 03/14/2022: B Natriuretic Peptide 371.7; TSH 0.328 03/15/2022: ALT 18 03/16/2022: Magnesium 2.2 06/05/2022: BUN 39; Creatinine, Ser 1.65; Hemoglobin 8.9; Platelets 189; Potassium 4.9; Sodium 139    Lipid Panel    Component Value Date/Time   CHOL 160 10/28/2018 1758   TRIG 64 10/28/2018 1758   HDL 55 10/28/2018 1758   CHOLHDL 2.9 10/28/2018 1758   VLDL 13 10/28/2018 1758   LDLCALC 92 10/28/2018 1758      Wt Readings from Last 3 Encounters:  07/10/22 234 lb (106.1 kg)  06/20/22 233 lb (105.7 kg)  05/30/22 234 lb 6 oz (106.3 kg)         12/22/2021   10:38 AM  PAD Screen  Previous PAD dx? No  Previous surgical procedure? No  Pain with walking? No  Feet/toe relief with dangling? No  Painful, non-healing ulcers? No  Extremities discolored? No      ASSESSMENT AND PLAN:  1.  Renal artery stenosis: Recent angiography showed no significant renal artery  stenosis.  The right kidney is much smaller than the left and that is likely congenital and not due to renal artery stenosis.   2.  Paroxysmal atrial fibrillation: She seems to be in sinus rhythm.  She is on anticoagulation with Eliquis.  3.  Hyperlipidemia: Currently on small dose rosuvastatin.  4.  Essential hypertension: Blood pressure is mildly elevated.  She is interested in more blood pressure control.  She did not tolerate higher doses of carvedilol but she wants to try the lower doses.  Thus, I switched her from metoprolol to carvedilol 12.5 mg twice daily.   Disposition:   FU with with Dr. Rockey Situ in 3 months.  Signed,  Rogue Jury  Fletcher Anon, MD  07/10/2022 2:02 PM    [facility]

## 2022-07-10 NOTE — Patient Instructions (Signed)
Medication Instructions:  STOP the Metoprolol  START Carvedilol 12.5 mg twice daily  *If you need a refill on your cardiac medications before your next appointment, please call your pharmacy*   Lab Work: None ordered If you have labs (blood work) drawn today and your tests are completely normal, you will receive your results only by: [place] (if you have MyChart) OR A paper copy in the mail If you have any lab test that is abnormal or we need to change your treatment, we will call you to review the results.   Testing/Procedures: None ordered   Follow-Up: At [facility], you and your health needs are our priority.  As part of our continuing mission to provide you with exceptional heart care, we have created designated Provider Care Teams.  These Care Teams include your primary Cardiologist (physician) and Advanced Practice Providers (APPs -  Physician Assistants and Nurse Practitioners) who all work together to provide you with the care you need, when you need it.  We recommend signing up for the patient portal called "MyChart".  Sign up information is provided on this After Visit Summary.  MyChart is used to connect with patients for Virtual Visits (Telemedicine).  Patients are able to view lab/test results, encounter notes, upcoming appointments, etc.  Non-urgent messages can be sent to your provider as well.   To learn more about what you can do with MyChart, go to [email or web address].    Your next appointment:   3 month(s)  Provider:   You may see Ida Rogue, MD or one of the following Advanced Practice Providers on your designated Care Team:   Murray Hodgkins, NP Christell Faith, PA-C Cadence Kathlen Mody, PA-C Gerrie Nordmann, NP

## 2022-07-17 DIAGNOSIS — E039 Hypothyroidism, unspecified: Secondary | ICD-10-CM | POA: Diagnosis not present

## 2022-07-17 DIAGNOSIS — D649 Anemia, unspecified: Secondary | ICD-10-CM | POA: Diagnosis not present

## 2022-07-17 DIAGNOSIS — M8589 Other specified disorders of bone density and structure, multiple sites: Secondary | ICD-10-CM | POA: Diagnosis not present

## 2022-07-17 DIAGNOSIS — N1832 Chronic kidney disease, stage 3b: Secondary | ICD-10-CM | POA: Diagnosis not present

## 2022-07-17 DIAGNOSIS — E785 Hyperlipidemia, unspecified: Secondary | ICD-10-CM | POA: Diagnosis not present

## 2022-07-17 DIAGNOSIS — E538 Deficiency of other specified B group vitamins: Secondary | ICD-10-CM | POA: Diagnosis not present

## 2022-07-17 DIAGNOSIS — E611 Iron deficiency: Secondary | ICD-10-CM | POA: Diagnosis not present

## 2022-07-24 DIAGNOSIS — I5030 Unspecified diastolic (congestive) heart failure: Secondary | ICD-10-CM | POA: Diagnosis not present

## 2022-07-24 DIAGNOSIS — D6869 Other thrombophilia: Secondary | ICD-10-CM | POA: Diagnosis not present

## 2022-07-24 DIAGNOSIS — I10 Essential (primary) hypertension: Secondary | ICD-10-CM | POA: Diagnosis not present

## 2022-07-24 DIAGNOSIS — J90 Pleural effusion, not elsewhere classified: Secondary | ICD-10-CM | POA: Diagnosis not present

## 2022-07-24 DIAGNOSIS — Z0000 Encounter for general adult medical examination without abnormal findings: Secondary | ICD-10-CM | POA: Diagnosis not present

## 2022-07-24 DIAGNOSIS — Z1331 Encounter for screening for depression: Secondary | ICD-10-CM | POA: Diagnosis not present

## 2022-07-24 DIAGNOSIS — M8580 Other specified disorders of bone density and structure, unspecified site: Secondary | ICD-10-CM | POA: Diagnosis not present

## 2022-07-24 DIAGNOSIS — R82998 Other abnormal findings in urine: Secondary | ICD-10-CM | POA: Diagnosis not present

## 2022-07-24 DIAGNOSIS — R0609 Other forms of dyspnea: Secondary | ICD-10-CM | POA: Diagnosis not present

## 2022-07-24 DIAGNOSIS — E785 Hyperlipidemia, unspecified: Secondary | ICD-10-CM | POA: Diagnosis not present

## 2022-07-24 DIAGNOSIS — Z1339 Encounter for screening examination for other mental health and behavioral disorders: Secondary | ICD-10-CM | POA: Diagnosis not present

## 2022-07-24 DIAGNOSIS — I480 Paroxysmal atrial fibrillation: Secondary | ICD-10-CM | POA: Diagnosis not present

## 2022-07-24 DIAGNOSIS — D649 Anemia, unspecified: Secondary | ICD-10-CM | POA: Diagnosis not present

## 2022-07-24 DIAGNOSIS — N184 Chronic kidney disease, stage 4 (severe): Secondary | ICD-10-CM | POA: Diagnosis not present

## 2022-07-24 DIAGNOSIS — E039 Hypothyroidism, unspecified: Secondary | ICD-10-CM | POA: Diagnosis not present

## 2022-07-24 DIAGNOSIS — I130 Hypertensive heart and chronic kidney disease with heart failure and stage 1 through stage 4 chronic kidney disease, or unspecified chronic kidney disease: Secondary | ICD-10-CM | POA: Diagnosis not present

## 2022-07-26 ENCOUNTER — Telehealth: Payer: Self-pay | Admitting: Cardiovascular Disease

## 2022-07-26 NOTE — Telephone Encounter (Signed)
    Primary Cardiologist: Ida Rogue, MD  Chart reviewed as part of pre-operative protocol coverage. Simple dental extractions are considered low risk procedures per guidelines and generally do not require any specific cardiac clearance. It is also generally accepted that for simple extractions and dental cleanings, there is no need to interrupt blood thinner therapy.   SBE prophylaxis is not required for the patient.  I will route this recommendation to the requesting party via Epic fax function and remove from pre-op pool.  Please call with questions.  Deberah Pelton, NP 07/26/2022, 11:29 AM

## 2022-07-26 NOTE — Telephone Encounter (Signed)
   Pre-operative Risk Assessment    Patient Name: Lynn Joseph  DOB: 1946-01-26 MRN: [id number]     Request for Surgical Clearance    Procedure:  Filling, [place] or [name]  Date of Surgery:  Clearance TBD                                 Surgeon:  Dr. Lorin Glass Surgeon's Group or Practice Name:  [facility]  Phone number:  [phone] Fax number:  [phone]   Type of Clearance Requested:   - Medical    Type of Anesthesia:  Local    Additional requests/questions:    Signed, Maxwell Caul   07/26/2022, 8:44 AM

## 2022-07-30 ENCOUNTER — Other Ambulatory Visit: Payer: Self-pay | Admitting: Internal Medicine

## 2022-07-30 DIAGNOSIS — Z1231 Encounter for screening mammogram for malignant neoplasm of breast: Secondary | ICD-10-CM

## 2022-08-06 DIAGNOSIS — J90 Pleural effusion, not elsewhere classified: Secondary | ICD-10-CM | POA: Diagnosis not present

## 2022-08-06 DIAGNOSIS — N184 Chronic kidney disease, stage 4 (severe): Secondary | ICD-10-CM | POA: Diagnosis not present

## 2022-08-06 DIAGNOSIS — R0609 Other forms of dyspnea: Secondary | ICD-10-CM | POA: Diagnosis not present

## 2022-08-06 DIAGNOSIS — D649 Anemia, unspecified: Secondary | ICD-10-CM | POA: Diagnosis not present

## 2022-08-06 DIAGNOSIS — I129 Hypertensive chronic kidney disease with stage 1 through stage 4 chronic kidney disease, or unspecified chronic kidney disease: Secondary | ICD-10-CM | POA: Diagnosis not present

## 2022-08-06 DIAGNOSIS — R8271 Bacteriuria: Secondary | ICD-10-CM | POA: Diagnosis not present

## 2022-08-06 DIAGNOSIS — I480 Paroxysmal atrial fibrillation: Secondary | ICD-10-CM | POA: Diagnosis not present

## 2022-08-17 ENCOUNTER — Ambulatory Visit (HOSPITAL_BASED_OUTPATIENT_CLINIC_OR_DEPARTMENT_OTHER): Payer: Medicare Other | Admitting: Pulmonary Disease

## 2022-08-17 ENCOUNTER — Encounter (HOSPITAL_BASED_OUTPATIENT_CLINIC_OR_DEPARTMENT_OTHER): Payer: Self-pay

## 2022-08-17 ENCOUNTER — Telehealth (HOSPITAL_BASED_OUTPATIENT_CLINIC_OR_DEPARTMENT_OTHER): Payer: Self-pay | Admitting: Pulmonary Disease

## 2022-08-17 DIAGNOSIS — G4733 Obstructive sleep apnea (adult) (pediatric): Secondary | ICD-10-CM

## 2022-08-17 DIAGNOSIS — J90 Pleural effusion, not elsewhere classified: Secondary | ICD-10-CM

## 2022-08-17 NOTE — Telephone Encounter (Signed)
Please advise 

## 2022-08-17 NOTE — Telephone Encounter (Signed)
When calling the patient to cancel her OV for today patient stated that she replaces her face mask monthly through her DME company. Patient also states they have tried to contact us and that they need a script sent in for the new mask. Patient said her mask is leaking and was the reason of the appointment today. Please advise and call patient back when applicable.

## 2022-08-17 NOTE — Telephone Encounter (Signed)
Order has been placed. Nothing further needed. 

## 2022-08-17 NOTE — Telephone Encounter (Signed)
Please place order for CPAP mask and supplies. Based on conversation with patient, our office may need to contact DME to clarify any specific orders for mask.

## 2022-08-20 NOTE — Telephone Encounter (Signed)
Returned patient's call and she wanted an update on her clearance and whether or not she has be cleared and if she needed to stop Eliquis. I made her aware to the recommendations per pre-op team. Patient verbalized understanding and thanked me for the call.

## 2022-08-20 NOTE — Telephone Encounter (Signed)
Patient calling with question concerning the pre opp. She asking that sone one call her back after 4. Please advise

## 2022-08-27 DIAGNOSIS — E785 Hyperlipidemia, unspecified: Secondary | ICD-10-CM | POA: Diagnosis not present

## 2022-08-27 DIAGNOSIS — I10 Essential (primary) hypertension: Secondary | ICD-10-CM | POA: Diagnosis not present

## 2022-08-27 DIAGNOSIS — D649 Anemia, unspecified: Secondary | ICD-10-CM | POA: Diagnosis not present

## 2022-08-27 DIAGNOSIS — R7989 Other specified abnormal findings of blood chemistry: Secondary | ICD-10-CM | POA: Diagnosis not present

## 2022-09-04 ENCOUNTER — Other Ambulatory Visit: Payer: Self-pay | Admitting: Cardiovascular Disease

## 2022-09-04 NOTE — Telephone Encounter (Signed)
Please review

## 2022-09-04 NOTE — Telephone Encounter (Signed)
Prescription refill request for Eliquis received. Indication: AFIB  Last office visit: Arida, 07/10/2022 Scr: 1.65, 06/05/2022 Age:  77 yo  Weight: 106.1 kg   Refill sent.

## 2022-09-05 ENCOUNTER — Ambulatory Visit (INDEPENDENT_AMBULATORY_CARE_PROVIDER_SITE_OTHER): Payer: Medicare Other | Admitting: Pulmonary Disease

## 2022-09-05 ENCOUNTER — Encounter (HOSPITAL_BASED_OUTPATIENT_CLINIC_OR_DEPARTMENT_OTHER): Payer: Self-pay | Admitting: Pulmonary Disease

## 2022-09-05 VITALS — BP 126/60 | HR 65 | Ht 65.0 in | Wt 226.0 lb

## 2022-09-05 DIAGNOSIS — G4733 Obstructive sleep apnea (adult) (pediatric): Secondary | ICD-10-CM

## 2022-09-05 NOTE — Patient Instructions (Signed)
Moderate OSA --Patient uses NIV for more than four hours nightly for at least 70% of nights during the last three months of usage. The patient has been using and benefiting from PAP use and will continue to benefit from therapy.  --Will obtain compliance report --Counseled on sleep hygiene --Counseled on weight loss/maintenance of healthy weight --Counseled NOT to drive if/when sleepy --Advised patient to wear CPAP for at least 4 hours each night for greater than 70% of the time to avoid the machine being repossessed by insurance.  Recent covid - improving --Supportive care with adequate hydration and returning to regular aerobic activity as soon as able

## 2022-09-05 NOTE — Progress Notes (Signed)
Subjective:   [name]: Lynn Joseph GENDER: female DOB: 08/06/1945, MRN: [id number]  Chief Complaint  Patient presents with   Follow-up    Positive for covid last Thursday     Reason for Visit: Follow-up  [name] is a 77 year old female with atrial fibrillation, hx pleural effusion that self resolved, HTN, hypothyroidism, s/p roux-en-y gastric bypass  who presents for OSA follow-up.  2023 - Initially seen by  Pulm while inpatient for [facility] and found with right pleural effusion.  2024 - Self resolution of right pleural effusion. Dx with OSA and CPAP ordered in Feb  with recent cholecystitis s/p biliary drain placed 11/8 in [place] and recent hospitalization for new onset atrial fibrillation and pericapsular hematoma on 11/16-11/21. During this hospitalization she underwent lap cholecystectomy on 11/19 without complications. After discharge on 11/22 she returned to the ED with complaints of shortness of breath with laying down. Reports weight gain of 15lbs and lower extremity swelling. Denies fevers, chills. CXR demonstrated right moderate effusion with atelectasis. She was given lasix and admitted to [facility]. PCCM consulted for thoracentesis and pleural fluid management.   Has annual history of respiratory illness requiring treatment and possible pleural effusion in Sept 2023. Denies any history of thoracentesis. CT CAP 03/08/22 with cardiomegaly and small/moderate right pleural effusion during this effusion. Has had shortness of breath, wheezing and cough.  Today she reports she overall feels she is stronger and doing better. Walking more often. Does feels she is not getting enough air and waking up at night. While in the hospital she reports needing to wear oxygen at night.  05/14/22 Since our last visit she reports improved energy. Able to walk the dogs. Not having as frequent nocturnal awakenings. Only nightly compared to 3-4 times in the past. Occasional  wheezing. Denies cough  09/05/22 Has not yet received her new CPAP mask and supplies. States the the DME ([employer]) reached out to her first and told her to request for monthly mask prescription. This was ordered on 08/17/22. Reports she has more energy using her CPAP 6-7 hours nightly. Has not worn in the last week due to covid infection mainly manifesting as fever. Denies shortness of breath, coughing and wheezing.   Social History: Denies any smoking history however lived with smoker and had wood burning stove used for heat and cooking in home for >30 years    Past Medical History:  Diagnosis Date   Anemia of chronic disease    Chronic diarrhea    Glaucoma    HTN (hypertension)    Hypertension    Hypothyroid    Obesity    Skin cancer      Family History  Problem Relation Age of Onset   Hypertension Mother    Thyroid disease Mother    Stroke Mother    Kidney disease Father    Diabetes Father      Social History   Occupational History   Not on file  Tobacco Use   Smoking status: Never   Smokeless tobacco: Never  Vaping Use   Vaping Use: Never used  Substance and Sexual Activity   Alcohol use: Not Currently    Comment: 1 glass of wine weekly   Drug use: No   Sexual activity: Not on file    Allergies  Allergen Reactions   Amlodipine Swelling and Other (See Comments)    Lowered BP to much, lethargic    Coreg [Carvedilol] Other (See Comments)  Dropped blood pressure to low   Penicillin G Sodium Other (See Comments)    GI upset      Outpatient Medications Prior to Visit  Medication Sig Dispense Refill   Acetaminophen (TYLENOL PO) Take 2 tablets by mouth 2 (two) times daily as needed (pain).     apixaban (ELIQUIS) 5 MG TABS tablet TAKE 1 TABLET TWICE A DAY 180 tablet 1   Calcium-Magnesium-Zinc (CALCIUM-MAGNESUIUM-ZINC PO) Take 1 tablet by mouth daily. Calcium 600 mg Vit b6     hydrALAZINE (APRESOLINE) 100 MG tablet Take 1 tablet (100 mg total) by mouth 3 (three)  times daily. 270 tablet 3   hydrochlorothiazide (HYDRODIURIL) 12.5 MG tablet      Levothyroxine Sodium (TIROSINT) 100 MCG CAPS Take 1 capsule (100 mcg total) by mouth daily before breakfast. 30 capsule 0   Multiple Vitamins-Minerals (MULTIVITAMIN WITH MINERALS) tablet Take 1 tablet by mouth daily.     rosuvastatin (CRESTOR) 5 MG tablet Take 1 tablet (5 mg total) by mouth daily. 90 tablet 3   Thiamine HCl (VITAMIN B-1 PO) Take 100 mg by mouth daily.     vitamin B-12 (CYANOCOBALAMIN) 50 MCG tablet Take 50 mcg by mouth daily.     carvedilol (COREG) 12.5 MG tablet Take 1 tablet (12.5 mg total) by mouth 2 (two) times daily. 180 tablet 3   triamterene-hydrochlorothiazide (DYAZIDE) 37.5-25 MG capsule Take 1 capsule by mouth daily.     No facility-administered medications prior to visit.    Review of Systems  Constitutional:  Positive for fever. Negative for chills, diaphoresis, malaise/fatigue and weight loss.  HENT:  Negative for congestion.   Respiratory:  Negative for cough, hemoptysis, sputum production, shortness of breath and wheezing.   Cardiovascular:  Negative for chest pain, palpitations and leg swelling.     Objective:   Vitals:   09/05/22 1057  BP: 126/60  Pulse: 65  SpO2: 96%  Weight: 226 lb (102.5 kg)  Height: 5\' 5"  (1.651 m)   SpO2: 96 % O2 Device: None (Room air)  Physical Exam: General: Well-appearing, no acute distress HENT: [place], AT Eyes: EOMI, no scleral icterus Respiratory: Clear to auscultation bilaterally.  No crackles, wheezing or rales Cardiovascular: RRR, -M/R/G, no JVD Extremities:-Edema,-tenderness Neuro: AAO x4, CNII-XII grossly intact Psych: Normal mood, normal affect  Data Reviewed:  Imaging: CXR 03/19/22 - Increased right pleural effusion CXR 05/14/22 - Resolved right pleural effusion  PFT: 05/14/22 FVC 2.21 (78%) FEV1 1.80 (85%) Ratio 82  TLC 96% DLCO 87% Interpretation: Normal PFTs  Sleep: HST 05/23/22 - Moderate OSA AHI 19.5 Nadir SpO2  82%  Labs:  Thoracentesis 03/16/2022 Red turbid fluid with cell count 1990 N 17% L 72% M percent Protein ratio 3.5/6.4 LDH ratio 141/251 Culture - Neg Cytology - Reactive mesothelial cells present  Glucose - 125  [facility] 07/24/22 - CBC with diff, CMET, TSH, T3/4, B12    Assessment & Plan:   Discussion: 77 year old female with HTN, hypothyroidism, atrial fibrillation on AC, s/p roux-en-y gastric bypass in 2009, CKD IIIb, ?diastolic heart failure who presents for OSA follow-up. Improved respiratory symptoms. Improved energy and sleep with CPAP use however does leak so requesting monthly mask per DME request. Patient reports she has not received this. Internal communications sent to representative Ollen Barges the week of 4/29, 5/10, 5/15 and 5/17.   Moderate OSA --Patient uses NIV for more than four hours nightly for at least 70% of nights during the last three months of usage. The patient  has been using and benefiting from PAP use and will continue to benefit from therapy.  --Will obtain compliance report --Counseled on sleep hygiene --Counseled on weight loss/maintenance of healthy weight --Counseled NOT to drive if/when sleepy --Advised patient to wear CPAP for at least 4 hours each night for greater than 70% of the time to avoid the machine being repossessed by insurance.  Recent covid - improving --Supportive care with adequate hydration and returning to regular aerobic activity as soon as able   Health Maintenance Immunization History  Administered Date(s) Administered   Influenza, High Dose Seasonal PF 01/31/2016   PFIZER(Purple Top)SARS-COV-2 Vaccination 05/15/2019, 06/05/2019   CT Lung Screen not qualified. Never smoker  No orders of the defined types were placed in this encounter. No orders of the defined types were placed in this encounter.   Return in about 6 months (around 03/08/2023).  I have spent a total time of 35-minutes on the day of the  appointment including chart review, data review, collecting history, coordinating care and discussing medical diagnosis and plan with the patient/family. Past medical history, allergies, medications were reviewed. Pertinent imaging, labs and tests included in this note have been reviewed and interpreted independently by me.  Teruo Stilley Mechele Collin, MD [facility] 09/05/2022 11:40 AM  Office Number [phone]

## 2022-09-08 ENCOUNTER — Encounter (HOSPITAL_BASED_OUTPATIENT_CLINIC_OR_DEPARTMENT_OTHER): Payer: Self-pay | Admitting: Pulmonary Disease

## 2022-09-10 ENCOUNTER — Ambulatory Visit
Admission: RE | Admit: 2022-09-10 | Discharge: 2022-09-10 | Disposition: A | Discharge status: Home / Self Care | Payer: Medicare Other | Source: Ambulatory Visit | Attending: Internal Medicine | Admitting: Internal Medicine

## 2022-09-10 DIAGNOSIS — Z1231 Encounter for screening mammogram for malignant neoplasm of breast: Secondary | ICD-10-CM | POA: Diagnosis not present

## 2022-09-18 DIAGNOSIS — I480 Paroxysmal atrial fibrillation: Secondary | ICD-10-CM | POA: Diagnosis not present

## 2022-09-18 DIAGNOSIS — G4733 Obstructive sleep apnea (adult) (pediatric): Secondary | ICD-10-CM | POA: Diagnosis not present

## 2022-09-18 DIAGNOSIS — F17200 Nicotine dependence, unspecified, uncomplicated: Secondary | ICD-10-CM | POA: Diagnosis not present

## 2022-09-18 DIAGNOSIS — I130 Hypertensive heart and chronic kidney disease with heart failure and stage 1 through stage 4 chronic kidney disease, or unspecified chronic kidney disease: Secondary | ICD-10-CM | POA: Diagnosis not present

## 2022-09-18 DIAGNOSIS — D6869 Other thrombophilia: Secondary | ICD-10-CM | POA: Diagnosis not present

## 2022-09-18 DIAGNOSIS — R600 Localized edema: Secondary | ICD-10-CM | POA: Diagnosis not present

## 2022-09-18 DIAGNOSIS — M79674 Pain in right toe(s): Secondary | ICD-10-CM | POA: Diagnosis not present

## 2022-09-18 DIAGNOSIS — N184 Chronic kidney disease, stage 4 (severe): Secondary | ICD-10-CM | POA: Diagnosis not present

## 2022-09-18 DIAGNOSIS — I5030 Unspecified diastolic (congestive) heart failure: Secondary | ICD-10-CM | POA: Diagnosis not present

## 2022-10-22 NOTE — Progress Notes (Unsigned)
Cardiology Office Note  Date:  10/23/2022   ID:  Lynn Joseph, DOB 1945-06-10, MRN [id number]  PCP:  Alysia Penna, MD   Chief Complaint  Patient presents with   3 month follow up     "Doing well." Medications reviewed by the patient verbally.     HPI:  [name] is a 77 year old woman with past medical history of Hypertension Thyroid disorder Apnea CRI PAD PAF seen November 2023 in the setting of acute cholecystitis Presenting for follow-up of her diastolic CHF, paroxysmal atrial fibrillation, renal artery stenosis  Last seen by myself 2/24 Seen by Dr. Kirke Corin renal artery angiography last month which showed normal left renal artery with mild nonobstructive stenosis affecting the right renal artery.   CR up to 1.85, BUN 41 in May 2024 Reports she takes Lasix very sparingly for leg swelling worse on the right than the left Primary care decreased HCTZ down to 12.5 daily  Reports blood pressure has been running high at home 140 systolic Interested in changing to medication other than metoprolol to tartrate, Previous hypertensive response to carvedilol with fatigue, does not want to try carvedilol again Would like to consider bystolic  EKG personally reviewed by myself on todays visit EKG Interpretation Date/Time:  Tuesday October 23 2022 14:20:38 EDT Ventricular Rate:  60 PR Interval:  156 QRS Duration:  84 QT Interval:  436 QTC Calculation: 436 R Axis:   23  Text Interpretation: Normal sinus rhythm Normal ECG When compared with ECG of 15-Mar-2022 06:02, No significant change was found Confirmed by Julien Nordmann [phone]) on 10/23/2022 2:46:04 PM   Other past medical history reviewed  Was in [place] hospitalized in November 2023 with new onset atrial fibrillation with RVR in the setting of acute cholecystitis. Following surgery with pleural effusion, thoracentesis performed.  She was treated with IV diltiazem and metoprolol and converted to sinus rhythm. She  was started on anticoagulation with Eliquis given CHA2DS2-VASc score of 4.   Right renal artery ultrasound performed, right side was not well-visualized and possibly occluded with small size right kidney at 8.7 cm. Left renal artery was patent with normal size left kidney. She has chronic kidney disease with most recent creatinine of 1.58.   In follow-up today reports feeling relatively well Blood pressure elevated before her morning medication, Running in the mid 130 range systolic after medications  Denies significant lower extremity swelling No significant chest pain or shortness of breath  Sedentary, no regular exercise program Runs dog rescue,   Echocardiogram November 2023 EF 55 to 60%  U/s in 10/21 Right Carotid: Velocities in the right ICA are consistent with a 1-39% stenosis.  Left Carotid: Velocities in the left ICA are consistent with a 40-59% stenosis.   PMH:   has a past medical history of Anemia of chronic disease, Chronic diarrhea, Glaucoma, HTN (hypertension), Hypertension, Hypothyroid, Obesity, and Skin cancer.  PSH:    Past Surgical History:  Procedure Laterality Date   CHOLECYSTECTOMY N/A 03/11/2022   Procedure: LAPAROSCOPIC CHOLECYSTECTOMY;  Surgeon: Fritzi Mandes, MD;  Location: [facility] OR;  Service: General;  Laterality: N/A;   GASTRIC BYPASS     GASTRIC RESTRICTION SURGERY     IR REMOVAL BILIARY DRAIN  04/06/2022   IR SINUS/FIST TUBE CHK-NON GI  03/09/2022   RENAL ANGIOGRAPHY N/A 06/20/2022   Procedure: RENAL ANGIOGRAPHY;  Surgeon: Iran Ouch, MD;  Location: [facility] INVASIVE CV LAB;  Service: Cardiovascular;  Laterality: N/A;   TUBAL LIGATION  Current Outpatient Medications  Medication Sig Dispense Refill   Acetaminophen (TYLENOL PO) Take 2 tablets by mouth 2 (two) times daily as needed (pain).     apixaban (ELIQUIS) 5 MG TABS tablet TAKE 1 TABLET TWICE A DAY 180 tablet 1   Calcium-Magnesium-Zinc (CALCIUM-MAGNESUIUM-ZINC PO) Take 1 tablet by mouth  daily. Calcium 600 mg Vit b6     furosemide (LASIX) 20 MG tablet Take 20 mg by mouth as needed. Take as needed for 4-5 lb wt gain     hydrALAZINE (APRESOLINE) 100 MG tablet Take 1 tablet (100 mg total) by mouth 3 (three) times daily. 270 tablet 3   hydrochlorothiazide (HYDRODIURIL) 12.5 MG tablet Take 12.5 mg by mouth daily.     Levothyroxine Sodium (TIROSINT) 100 MCG CAPS Take 1 capsule (100 mcg total) by mouth daily before breakfast. 30 capsule 0   metoprolol tartrate (LOPRESSOR) 100 MG tablet Take 100 mg by mouth 2 (two) times daily.     Multiple Vitamins-Minerals (MULTIVITAMIN WITH MINERALS) tablet Take 1 tablet by mouth daily.     rosuvastatin (CRESTOR) 5 MG tablet Take 1 tablet (5 mg total) by mouth daily. 90 tablet 3   Thiamine HCl (VITAMIN B-1 PO) Take 100 mg by mouth daily.     vitamin B-12 (CYANOCOBALAMIN) 50 MCG tablet Take 50 mcg by mouth daily.     No current facility-administered medications for this visit.    Allergies:   Amlodipine, Coreg [carvedilol], and Penicillin g sodium   Social History:  The patient  reports that she has never smoked. She has never used smokeless tobacco. She reports that she does not currently use alcohol. She reports that she does not use drugs.   Family History:   family history includes Diabetes in her father; Hypertension in her mother; Kidney disease in her father; Stroke in her mother; Thyroid disease in her mother.    Review of Systems: Review of Systems  Constitutional: Negative.   HENT: Negative.    Respiratory: Negative.    Cardiovascular:  Positive for leg swelling.  Gastrointestinal: Negative.   Musculoskeletal: Negative.   Neurological: Negative.   Psychiatric/Behavioral: Negative.    All other systems reviewed and are negative.   PHYSICAL EXAM: VS:  BP (!) 158/70 (BP Location: Left Arm, Patient Position: Sitting, Cuff Size: Normal)   Pulse 60   Ht 5\' 5"  (1.651 m)   Wt 220 lb (99.8 kg)   SpO2 97%   BMI 36.61 kg/m  , BMI  Body mass index is 36.61 kg/m. Constitutional:  oriented to person, place, and time. No distress.  HENT:  Head: Grossly normal Eyes:  no discharge. No scleral icterus.  Neck: No JVD, no carotid bruits  Cardiovascular: Regular rate and rhythm, no murmurs appreciated Trace swelling right leg Pulmonary/Chest: Clear to auscultation bilaterally, no wheezes or rales Abdominal: Soft.  no distension.  no tenderness.  Musculoskeletal: Normal range of motion Neurological:  normal muscle tone. Coordination normal. No atrophy Skin: Skin warm and dry Psychiatric: normal affect, pleasant   Recent Labs: 03/14/2022: B Natriuretic Peptide 371.7; TSH 0.328 03/15/2022: ALT 18 03/16/2022: Magnesium 2.2 06/05/2022: BUN 39; Creatinine, Ser 1.65; Hemoglobin 8.9; Platelets 189; Potassium 4.9; Sodium 139    Lipid Panel Lab Results  Component Value Date   CHOL 160 10/28/2018   HDL 55 10/28/2018   LDLCALC 92 10/28/2018   TRIG 64 10/28/2018      Wt Readings from Last 3 Encounters:  10/23/22 220 lb (99.8 kg)  09/05/22 226 lb (102.5  kg)  07/10/22 234 lb (106.1 kg)      ASSESSMENT AND PLAN:  Problem List Items Addressed This Visit     CKD (chronic kidney disease) stage 3, GFR 30-59 ml/min (HCC)   Other Visit Diagnoses     Paroxysmal atrial fibrillation (HCC)    -  Primary   Relevant Medications   metoprolol tartrate (LOPRESSOR) 100 MG tablet   furosemide (LASIX) 20 MG tablet   Other Relevant Orders   EKG 12-Lead (Completed)   Hyperlipidemia, unspecified hyperlipidemia type       Relevant Medications   metoprolol tartrate (LOPRESSOR) 100 MG tablet   furosemide (LASIX) 20 MG tablet   Primary hypertension       Relevant Medications   metoprolol tartrate (LOPRESSOR) 100 MG tablet   furosemide (LASIX) 20 MG tablet   Other Relevant Orders   EKG 12-Lead (Completed)   Palpitations       Relevant Orders   EKG 12-Lead (Completed)   Renal insufficiency       Angina pectoris (HCC)        Relevant Medications   metoprolol tartrate (LOPRESSOR) 100 MG tablet   furosemide (LASIX) 20 MG tablet   Chest pain of uncertain etiology       Mixed hyperlipidemia       Relevant Medications   metoprolol tartrate (LOPRESSOR) 100 MG tablet   furosemide (LASIX) 20 MG tablet     Chest pain Denies significant chest pain Normal ejection fraction November 2023 Low risk stress test September 2023 with no significant ischemia  Crestor 5 mg daily No further testing needed at this time  Paroxysmal tachycardia/palpitations Denies significant tachypalpitations, prior Zio monitor with no significant arrhythmia Would like to change from metoprolol to alternate medication that helps blood pressure She has mentioned bystolic (prefers not to be on carvedilol given prior reaction) We have recommended she start bystolic 10 twice daily with slow titration up to 20 twice daily as blood pressure tolerates  Hyperlipidemia Crestor 5 mg daily Goal LDL less than 70 She will have lab work done through primary care  Chronic renal sufficiency/renal artery stenosis Creatinine up to 1.8 Avoid NSAIDs  Essential hypertension Plan as above, transition from metoprolol to tartrate to bystolic Additional options for blood pressure control include Cardura/doxazosin Will try to avoid clonidine given her fatigue  Obesity We have encouraged continued exercise, careful diet management in an effort to lose weight.    Total encounter time more than 30 minutes  Greater than 50% was spent in counseling and coordination of care with the patient    Signed, Dossie Arbour, M.D., Ph.D. [facility] [place], [place] [phone]

## 2022-10-23 ENCOUNTER — Ambulatory Visit: Payer: Medicare Other | Attending: Cardiovascular Disease | Admitting: Cardiovascular Disease

## 2022-10-23 ENCOUNTER — Encounter: Payer: Self-pay | Admitting: Cardiovascular Disease

## 2022-10-23 VITALS — BP 158/70 | HR 60 | Ht 65.0 in | Wt 220.0 lb

## 2022-10-23 DIAGNOSIS — R002 Palpitations: Secondary | ICD-10-CM

## 2022-10-23 DIAGNOSIS — I10 Essential (primary) hypertension: Secondary | ICD-10-CM | POA: Diagnosis not present

## 2022-10-23 DIAGNOSIS — E782 Mixed hyperlipidemia: Secondary | ICD-10-CM

## 2022-10-23 DIAGNOSIS — I209 Angina pectoris, unspecified: Secondary | ICD-10-CM | POA: Diagnosis not present

## 2022-10-23 DIAGNOSIS — N1832 Chronic kidney disease, stage 3b: Secondary | ICD-10-CM

## 2022-10-23 DIAGNOSIS — I480 Paroxysmal atrial fibrillation: Secondary | ICD-10-CM | POA: Diagnosis not present

## 2022-10-23 DIAGNOSIS — R079 Chest pain, unspecified: Secondary | ICD-10-CM | POA: Diagnosis not present

## 2022-10-23 DIAGNOSIS — E785 Hyperlipidemia, unspecified: Secondary | ICD-10-CM | POA: Diagnosis not present

## 2022-10-23 DIAGNOSIS — N289 Disorder of kidney and ureter, unspecified: Secondary | ICD-10-CM

## 2022-10-23 MED ORDER — NEBIVOLOL HCL 20 MG PO TABS: 20.0000 mg | ORAL_TABLET | Freq: Two times a day (BID) | ORAL | 3 refills | Status: DC

## 2022-10-23 NOTE — Patient Instructions (Addendum)
Medication Instructions:  Hold the metoprolol Start bystolic 20 mg twice a day (try 10 mg twice a day for the first few days)  If you need a refill on your cardiac medications before your next appointment, please call your pharmacy.   Lab work: No new labs needed  Testing/Procedures: No new testing needed  Follow-Up: At [facility], you and your health needs are our priority.  As part of our continuing mission to provide you with exceptional heart care, we have created designated Provider Care Teams.  These Care Teams include your primary Cardiologist (physician) and Advanced Practice Providers (APPs -  Physician Assistants and Nurse Practitioners) who all work together to provide you with the care you need, when you need it.  You will need a follow up appointment in 6 months  Providers on your designated Care Team:   Nicolasa Ducking, NP Eula Listen, PA-C Cadence Fransico Michael, [place]  COVID-19 Vaccine Information can be found at: [email or web address] For questions related to vaccine distribution or appointments, please email [email or web address] or call [phone].

## 2022-11-05 ENCOUNTER — Telehealth (HOSPITAL_BASED_OUTPATIENT_CLINIC_OR_DEPARTMENT_OTHER): Payer: Self-pay | Admitting: Pulmonary Disease

## 2022-11-05 DIAGNOSIS — G4733 Obstructive sleep apnea (adult) (pediatric): Secondary | ICD-10-CM

## 2022-11-05 NOTE — Telephone Encounter (Signed)
Pt. Calling back about her face mash she still has not gotten a new on an in previous message looks like they worked on it but pt. Has still not received any mask please advise

## 2022-11-05 NOTE — Telephone Encounter (Signed)
I have called and spoke with pt. [employer] has not answered the phone. I will send a order for another dme company. Pt has been trying to get a new mask since April. Order has been placed for cpap supplies

## 2022-11-09 DIAGNOSIS — N184 Chronic kidney disease, stage 4 (severe): Secondary | ICD-10-CM | POA: Diagnosis not present

## 2022-11-09 DIAGNOSIS — I130 Hypertensive heart and chronic kidney disease with heart failure and stage 1 through stage 4 chronic kidney disease, or unspecified chronic kidney disease: Secondary | ICD-10-CM | POA: Diagnosis not present

## 2022-11-09 DIAGNOSIS — M109 Gout, unspecified: Secondary | ICD-10-CM | POA: Diagnosis not present

## 2022-11-09 DIAGNOSIS — I5030 Unspecified diastolic (congestive) heart failure: Secondary | ICD-10-CM | POA: Diagnosis not present

## 2022-11-16 ENCOUNTER — Encounter: Payer: Self-pay | Admitting: Cardiovascular Disease

## 2022-11-16 ENCOUNTER — Other Ambulatory Visit (HOSPITAL_COMMUNITY): Payer: Self-pay

## 2022-11-16 ENCOUNTER — Telehealth: Payer: Self-pay

## 2022-11-16 NOTE — Telephone Encounter (Signed)
Pharmacy Patient Advocate Encounter   Received notification from Patient Pharmacy[facility] that prior authorization for Nebivolol HCl 20MG  tabletsis required/requested.   Insurance verification completed.   The patient is insured through [employer] .   Per test claim: PA required; PA submitted to [employer] via CoverMyMeds Key/confirmation #/EOC [id number]      Status is pending

## 2022-11-19 ENCOUNTER — Other Ambulatory Visit (HOSPITAL_COMMUNITY): Payer: Self-pay

## 2022-11-21 NOTE — Telephone Encounter (Signed)
Pharmacy Patient Advocate Encounter  Received notification from [employer] that Prior Authorization for Nebivolol HCl 20MG  tabletshas been  APPROVED FROM 10/17/22 TO 04/22/2098

## 2022-12-26 DIAGNOSIS — Z23 Encounter for immunization: Secondary | ICD-10-CM | POA: Diagnosis not present

## 2022-12-26 DIAGNOSIS — E785 Hyperlipidemia, unspecified: Secondary | ICD-10-CM | POA: Diagnosis not present

## 2022-12-26 DIAGNOSIS — D649 Anemia, unspecified: Secondary | ICD-10-CM | POA: Diagnosis not present

## 2022-12-26 DIAGNOSIS — I10 Essential (primary) hypertension: Secondary | ICD-10-CM | POA: Diagnosis not present

## 2023-01-02 DIAGNOSIS — M109 Gout, unspecified: Secondary | ICD-10-CM | POA: Diagnosis not present

## 2023-01-07 ENCOUNTER — Telehealth: Payer: Self-pay | Admitting: Pulmonary Disease

## 2023-01-07 DIAGNOSIS — G4734 Idiopathic sleep related nonobstructive alveolar hypoventilation: Secondary | ICD-10-CM

## 2023-01-07 DIAGNOSIS — G4733 Obstructive sleep apnea (adult) (pediatric): Secondary | ICD-10-CM

## 2023-01-07 NOTE — Telephone Encounter (Signed)
Patient is calling about her cpap face mask. She called back a few months ago and still has not received a new face mask. She would like a new DME provider to supply her  cpap equipment.

## 2023-01-14 NOTE — Telephone Encounter (Signed)
Spoke with patient.  She is unhappy with [employer] who is supposed to be supplying her.  New order placed for cpap supplies.

## 2023-01-23 ENCOUNTER — Ambulatory Visit: Payer: Medicare Other | Admitting: Orthopaedic Surgery

## 2023-01-23 VITALS — Ht 65.0 in | Wt 220.0 lb

## 2023-01-23 DIAGNOSIS — M25561 Pain in right knee: Secondary | ICD-10-CM | POA: Diagnosis not present

## 2023-01-23 DIAGNOSIS — M25562 Pain in left knee: Secondary | ICD-10-CM | POA: Diagnosis not present

## 2023-01-23 DIAGNOSIS — G8929 Other chronic pain: Secondary | ICD-10-CM | POA: Diagnosis not present

## 2023-01-23 MED ORDER — LIDOCAINE HCL 1 % IJ SOLN
3.0000 mL | INTRAMUSCULAR | Status: AC | PRN
  Administered 2023-01-23: 3 mL

## 2023-01-23 MED ORDER — METHYLPREDNISOLONE ACETATE 40 MG/ML IJ SUSP
40.0000 mg | INTRAMUSCULAR | Status: AC | PRN
  Administered 2023-01-23: 40 mg via INTRA_ARTICULAR

## 2023-01-23 NOTE — Progress Notes (Signed)
The patient is someone we have seen in the past.  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  It has been over 3 years since we have injected steroids in her knees.  She is not on blood thinning medication due to atrial fibrillation.  She has lost weight but her BMI is still 36.61.  She comes in today requesting steroid injections in both knees.  She is not a diabetic.  On exam both knees have varus malalignment is correctable.  Both knees have significant medial joint line tenderness and patellofemoral crepitation with a lot of pain throughout the arc of motion of her knees.  Per her request I did place a sterile injection in both knees which she tolerated well.  She will continue her weight loss journey.  She is not interested in any type of surgical intervention unless things worsen for her.  All questions concerns were addressed and answered.    Procedure Note  Patient: Lynn Joseph             Date of Birth: Apr 22, 1946           MRN: [id number]             Visit Date: 01/23/2023  Procedures: Visit Diagnoses:  1. Chronic pain of left knee   2. Chronic pain of right knee     Large Joint Inj: R knee on 01/23/2023 1:47 PM Indications: diagnostic evaluation and pain Details: 22 G 1.5 in needle, superolateral approach  Arthrogram: No  Medications: 3 mL lidocaine 1 %; 40 mg methylPREDNISolone acetate 40 MG/ML Outcome: tolerated well, no immediate complications Procedure, treatment alternatives, risks and benefits explained, specific risks discussed. Consent was given by the patient. Immediately prior to procedure a time out was called to verify the correct patient, procedure, equipment, support staff and site/side marked as required. Patient was prepped and draped in the usual sterile fashion.    Large Joint Inj: L knee on 01/23/2023 1:47 PM Indications: diagnostic evaluation and pain Details: 22 G 1.5 in needle, superolateral approach  Arthrogram:  No  Medications: 3 mL lidocaine 1 %; 40 mg methylPREDNISolone acetate 40 MG/ML Outcome: tolerated well, no immediate complications Procedure, treatment alternatives, risks and benefits explained, specific risks discussed. Consent was given by the patient. Immediately prior to procedure a time out was called to verify the correct patient, procedure, equipment, support staff and site/side marked as required. Patient was prepped and draped in the usual sterile fashion.

## 2023-03-13 ENCOUNTER — Encounter: Payer: Self-pay | Admitting: *Deleted

## 2023-03-13 DIAGNOSIS — Z006 Encounter for examination for normal comparison and control in clinical research program: Secondary | ICD-10-CM

## 2023-03-13 NOTE — Research (Signed)
Spoke with Lynn Joseph about Lynn Joseph research. She states that it is ok to send her information about Lynn Joseph. Emailed her a copy of the consent to read over. Encouraged her to call with any questions, or if interested.

## 2023-03-19 ENCOUNTER — Encounter: Payer: Self-pay | Admitting: *Deleted

## 2023-03-19 DIAGNOSIS — Z006 Encounter for examination for normal comparison and control in clinical research program: Secondary | ICD-10-CM

## 2023-03-19 NOTE — Research (Signed)
Spoke with Ms Paxton to see if she would be interested in coming in for Screening with Lourena Simmonds research. She states she did not receive the information. Emailed her a copy of the consent to read over and encouraged her to call with any questions.

## 2023-03-25 ENCOUNTER — Encounter (HOSPITAL_BASED_OUTPATIENT_CLINIC_OR_DEPARTMENT_OTHER): Payer: Self-pay | Admitting: Pulmonary Disease

## 2023-03-25 ENCOUNTER — Telehealth: Payer: Self-pay | Admitting: Pulmonary Disease

## 2023-03-25 DIAGNOSIS — R0602 Shortness of breath: Secondary | ICD-10-CM

## 2023-03-25 NOTE — Telephone Encounter (Signed)
Called and spoke with patient on the phone pt stated that she has been having a SOB and pain her upper right side of her chest for about 1 week, she stated that she was going to go to urgent care and was told that  they didn't have anyone to read the report. Pt wanted to know if she could have an order sent to X-ray or have something sent to pharmacy, pt reported no fever. Little coughing with mucus. Feeling tried an weak.Please advise

## 2023-03-25 NOTE — Telephone Encounter (Signed)
Patient returning call. Please call back

## 2023-03-25 NOTE — Telephone Encounter (Signed)
Will schedule for acute visit on 03/26/23 at 10 AM with me. CXR prior to visit

## 2023-03-25 NOTE — Telephone Encounter (Signed)
Message has been sent see telephone encounter

## 2023-03-25 NOTE — Telephone Encounter (Signed)
Called and lvm for patient to call us back. 

## 2023-03-26 ENCOUNTER — Encounter (HOSPITAL_BASED_OUTPATIENT_CLINIC_OR_DEPARTMENT_OTHER): Payer: Self-pay | Admitting: Pulmonary Disease

## 2023-03-26 ENCOUNTER — Ambulatory Visit (HOSPITAL_BASED_OUTPATIENT_CLINIC_OR_DEPARTMENT_OTHER): Payer: Medicare Other

## 2023-03-26 ENCOUNTER — Ambulatory Visit (HOSPITAL_BASED_OUTPATIENT_CLINIC_OR_DEPARTMENT_OTHER): Payer: Medicare Other | Admitting: Pulmonary Disease

## 2023-03-26 VITALS — BP 130/58 | HR 64 | Resp 18 | Ht 65.0 in | Wt 221.0 lb

## 2023-03-26 DIAGNOSIS — R635 Abnormal weight gain: Secondary | ICD-10-CM | POA: Diagnosis not present

## 2023-03-26 DIAGNOSIS — J90 Pleural effusion, not elsewhere classified: Secondary | ICD-10-CM

## 2023-03-26 DIAGNOSIS — R0602 Shortness of breath: Secondary | ICD-10-CM

## 2023-03-26 DIAGNOSIS — R918 Other nonspecific abnormal finding of lung field: Secondary | ICD-10-CM | POA: Diagnosis not present

## 2023-03-26 NOTE — Progress Notes (Signed)
Subjective:   [name]: Lynn Joseph GENDER: female DOB: 11/17/1945, MRN: [id number]  Chief Complaint  Patient presents with   Acute Visit    Weak and SOB x 1 week. Denies fever, cough. SOB is more on exertion than at rest.     Reason for Visit: Follow-up  [name] is a 77 year old female with atrial fibrillation, hx pleural effusion that self resolved, HTN, hypothyroidism, s/p roux-en-y gastric bypass  who presents for OSA follow-up.  2023 - Initially seen by [facility] while inpatient for [facility] and found with right pleural effusion.  2024 - Self resolution of right pleural effusion. Dx with OSA and CPAP ordered in Feb  with recent cholecystitis s/p biliary drain placed 11/8 in [place] and recent hospitalization for new onset atrial fibrillation and pericapsular hematoma on 11/16-11/21. During this hospitalization she underwent lap cholecystectomy on 11/19 without complications. After discharge on 11/22 she returned to the ED with complaints of shortness of breath with laying down. Reports weight gain of 15lbs and lower extremity swelling. Denies fevers, chills. CXR demonstrated right moderate effusion with atelectasis. She was given lasix and admitted to [facility]. PCCM consulted for thoracentesis and pleural fluid management.   Has annual history of respiratory illness requiring treatment and possible pleural effusion in Sept 2023. Denies any history of thoracentesis. CT CAP 03/08/22 with cardiomegaly and small/moderate right pleural effusion during this effusion. Has had shortness of breath, wheezing and cough.  Today she reports she overall feels she is stronger and doing better. Walking more often. Does feels she is not getting enough air and waking up at night. While in the hospital she reports needing to wear oxygen at night.  05/14/22 Since our last visit she reports improved energy. Able to walk the dogs. Not having as frequent nocturnal awakenings. Only nightly  compared to 3-4 times in the past. Occasional wheezing. Denies cough  09/05/22 Has not yet received her new CPAP mask and supplies. States the the DME ([employer]) reached out to her first and told her to request for monthly mask prescription. This was ordered on 08/17/22. Reports she has more energy using her CPAP 6-7 hours nightly. Has not worn in the last week due to covid infection mainly manifesting as fever. Denies shortness of breath, coughing and wheezing.   03/26/23 She reports feeling right abdominal pain a few weeks ago that migrated to the right lung where it hurt to take a deep breath. The abdominal pain has resolved but still have breathing issues. Less energetic. Denies fever, chills. Minimal cough or sputum production. She does not hear wheezing but her daughter reports it. Overall feels run down and difficulty walking around due to energy. Previously reports 215 lbs and has gained up to 5 lbs and taken furosemide 10-20 mg intermittently but no changes in weight. Was unsure if she could take pills consecutively.  Compliant with CPAP nightly for 8 hours on average. She reports improvement in quality of sleep with the exception of recent symptoms above.  Social History: Denies any smoking history however lived with smoker and had wood burning stove used for heat and cooking in home for >30 years    Past Medical History:  Diagnosis Date   Anemia of chronic disease    Chronic diarrhea    Glaucoma    HTN (hypertension)    Hypertension    Hypothyroid    Obesity    Skin cancer      Family History  Problem  Relation Age of Onset   Hypertension Mother    Thyroid disease Mother    Stroke Mother    Kidney disease Father    Diabetes Father      Social History   Occupational History   Not on file  Tobacco Use   Smoking status: Never   Smokeless tobacco: Never  Vaping Use   Vaping status: Never Used  Substance and Sexual Activity   Alcohol use: Not Currently    Comment: 1  glass of wine weekly   Drug use: No   Sexual activity: Not on file    Allergies  Allergen Reactions   Amlodipine Swelling and Other (See Comments)    Lowered BP to much, lethargic    Coreg [Carvedilol] Other (See Comments)    Dropped blood pressure to low   Penicillin G Sodium Other (See Comments)    GI upset      Outpatient Medications Prior to Visit  Medication Sig Dispense Refill   Acetaminophen (TYLENOL PO) Take 2 tablets by mouth 2 (two) times daily as needed (pain).     apixaban (ELIQUIS) 5 MG TABS tablet TAKE 1 TABLET TWICE A DAY 180 tablet 1   Calcium-Magnesium-Zinc (CALCIUM-MAGNESUIUM-ZINC PO) Take 1 tablet by mouth daily. Calcium 600 mg Vit b6     furosemide (LASIX) 20 MG tablet Take 20 mg by mouth as needed. Take as needed for 4-5 lb wt gain     hydrALAZINE (APRESOLINE) 100 MG tablet Take 1 tablet (100 mg total) by mouth 3 (three) times daily. 270 tablet 3   hydrochlorothiazide (HYDRODIURIL) 12.5 MG tablet Take 12.5 mg by mouth daily.     Levothyroxine Sodium (TIROSINT) 100 MCG CAPS Take 1 capsule (100 mcg total) by mouth daily before breakfast. 30 capsule 0   Multiple Vitamins-Minerals (MULTIVITAMIN WITH MINERALS) tablet Take 1 tablet by mouth daily.     Nebivolol HCl (BYSTOLIC) 20 MG TABS Take 1 tablet (20 mg total) by mouth 2 (two) times daily. 180 tablet 3   rosuvastatin (CRESTOR) 5 MG tablet Take 1 tablet (5 mg total) by mouth daily. 90 tablet 3   Thiamine HCl (VITAMIN B-1 PO) Take 100 mg by mouth daily.     vitamin B-12 (CYANOCOBALAMIN) 50 MCG tablet Take 50 mcg by mouth daily.     No facility-administered medications prior to visit.    Review of Systems  Constitutional:  Negative for chills, diaphoresis, fever, malaise/fatigue and weight loss.  HENT:  Negative for congestion.   Respiratory:  Positive for shortness of breath. Negative for cough, hemoptysis, sputum production and wheezing.   Cardiovascular:  Positive for chest pain. Negative for palpitations and  leg swelling.     Objective:   Vitals:   03/26/23 0948  BP: (!) 130/58  Pulse: 64  Resp: 18  SpO2: 91%  Weight: 221 lb (100.2 kg)  Height: 5\' 5"  (1.651 m)   SpO2: 91 %  Physical Exam: General: Well-appearing, no acute distress HENT: Enigma, AT Eyes: EOMI, no scleral icterus Respiratory: Minimally diminished left base.  No crackles, wheezing or rales Cardiovascular: RRR, -M/R/G, no JVD Extremities:-Edema,-tenderness Neuro: AAO x4, CNII-XII grossly intact Psych: Normal mood, normal affect  Data Reviewed:  Imaging: CXR 03/19/22 - Increased right pleural effusion CXR 05/14/22 - Resolved right pleural effusion  PFT: 05/14/22 FVC 2.21 (78%) FEV1 1.80 (85%) Ratio 82  TLC 96% DLCO 87% Interpretation: Normal PFTs  Sleep: HST 05/23/22 - Moderate OSA AHI 19.5 Nadir SpO2 82%  Labs:  Thoracentesis 03/16/2022 Red  turbid fluid with cell count 1990 N 17% L 72% M percent Protein ratio 3.5/6.4 LDH ratio 141/251 Culture - Neg Cytology - Reactive mesothelial cells present  Glucose - 125  [facility] 07/24/22 - CBC with diff, CMET, TSH, T3/4, B12    Assessment & Plan:   Discussion: 77 year old female with HTN, hypothyroidism, atrial fibrillation on AC, s/p roux-en-y gastric bypass in 2009, CKD IIIb, ?diastolic heart failure who presents for OSA follow-up and shortness of breath. Compliant with CPAP. Improved energy and sleep with CPAP. Regarding shortness of breath may be taking inadequate doses of lasix. CXR inclinic with vascular congestion and small left pleural effusion  vs atelectasis.   Shortness of breath Weight gain Small Left Pleural Effusion on CXR --Take lasix 20 mg for two days --Monitor weights daily --No indication for antibiotics  Moderate OSA Patient uses NIV for more than four hours nightly for at least 70% of nights during the last three months of usage. The patient has been using and benefiting from PAP use and will continue to benefit from  therapy.   --Reviewed compliance report. 02/24/23-03/25/23 (30 days) 100% compliance with 90% >4 hours. Leakage occurs for 2 h 33 min. Avg AHI 27.8 --Counseled on sleep hygiene --Advised patient to wear CPAP for at least 4 hours each night for greater than 70% of the time to avoid the machine being repossessed by insurance. --If she continues to have fatigue, may need to reconsider mask fitting or PAP titration --REFER to mask desensitization   Health Maintenance Immunization History  Administered Date(s) Administered   Influenza, High Dose Seasonal PF 01/31/2016   PFIZER(Purple Top)SARS-COV-2 Vaccination 05/15/2019, 06/05/2019   CT Lung Screen not qualified. Never smoker  Orders Placed This Encounter  Procedures   DG Chest 2 View    Standing Status:   Future    Number of Occurrences:   1    Standing Expiration Date:   03/25/2024    Order Specific Question:   Reason for Exam (SYMPTOM  OR DIAGNOSIS REQUIRED)    Answer:   sob    Order Specific Question:   Preferred imaging location?    Answer:   [facility]   Desensitization mask fit    Standing Status:   Future    Standing Expiration Date:   03/25/2024    Order Specific Question:   Where should this test be performed:    Answer:   [facility]  No orders of the defined types were placed in this encounter.   Return in about 5 months (around 08/24/2023).  I have spent a total time of 30-minutes on the day of the appointment including chart review, data review, collecting history, coordinating care and discussing medical diagnosis and plan with the patient/family. Past medical history, allergies, medications were reviewed. Pertinent imaging, labs and tests included in this note have been reviewed and interpreted independently by me.  Luisangel Wainright Mechele Collin, MD  Pulmonary Critical Care 03/26/2023 11:11 AM  Office Number [phone]

## 2023-03-26 NOTE — Patient Instructions (Addendum)
Shortness of breath Weight gain Small Left Pleural Effusion on CXR --Take lasix 20 mg for two days --Monitor weights daily --No indication for antibiotics  Moderate OSA Patient uses NIV for more than four hours nightly for at least 70% of nights during the last three months of usage. The patient has been using and benefiting from PAP use and will continue to benefit from therapy.   --Obtain compliance report. Fax pending

## 2023-03-30 ENCOUNTER — Emergency Department (HOSPITAL_BASED_OUTPATIENT_CLINIC_OR_DEPARTMENT_OTHER): Payer: Medicare Other | Admitting: Radiology

## 2023-03-30 ENCOUNTER — Other Ambulatory Visit: Payer: Self-pay

## 2023-03-30 ENCOUNTER — Encounter (HOSPITAL_COMMUNITY): Payer: Self-pay | Admitting: Family Medicine

## 2023-03-30 ENCOUNTER — Observation Stay (HOSPITAL_BASED_OUTPATIENT_CLINIC_OR_DEPARTMENT_OTHER)
Admission: EM | Admit: 2023-03-30 | Discharge: 2023-03-31 | Disposition: A | Discharge status: Home / Self Care | Payer: Medicare Other | Attending: Family Medicine | Admitting: Family Medicine

## 2023-03-30 DIAGNOSIS — Z79899 Other long term (current) drug therapy: Secondary | ICD-10-CM | POA: Insufficient documentation

## 2023-03-30 DIAGNOSIS — J9601 Acute respiratory failure with hypoxia: Secondary | ICD-10-CM | POA: Diagnosis not present

## 2023-03-30 DIAGNOSIS — E039 Hypothyroidism, unspecified: Secondary | ICD-10-CM | POA: Diagnosis present

## 2023-03-30 DIAGNOSIS — I480 Paroxysmal atrial fibrillation: Secondary | ICD-10-CM | POA: Insufficient documentation

## 2023-03-30 DIAGNOSIS — N179 Acute kidney failure, unspecified: Secondary | ICD-10-CM | POA: Insufficient documentation

## 2023-03-30 DIAGNOSIS — E669 Obesity, unspecified: Secondary | ICD-10-CM | POA: Diagnosis present

## 2023-03-30 DIAGNOSIS — R0989 Other specified symptoms and signs involving the circulatory and respiratory systems: Secondary | ICD-10-CM | POA: Diagnosis not present

## 2023-03-30 DIAGNOSIS — G4733 Obstructive sleep apnea (adult) (pediatric): Secondary | ICD-10-CM | POA: Diagnosis present

## 2023-03-30 DIAGNOSIS — I5032 Chronic diastolic (congestive) heart failure: Secondary | ICD-10-CM | POA: Insufficient documentation

## 2023-03-30 DIAGNOSIS — D62 Acute posthemorrhagic anemia: Secondary | ICD-10-CM | POA: Diagnosis not present

## 2023-03-30 DIAGNOSIS — D649 Anemia, unspecified: Secondary | ICD-10-CM | POA: Diagnosis not present

## 2023-03-30 DIAGNOSIS — J189 Pneumonia, unspecified organism: Secondary | ICD-10-CM | POA: Diagnosis not present

## 2023-03-30 DIAGNOSIS — Z7901 Long term (current) use of anticoagulants: Secondary | ICD-10-CM | POA: Insufficient documentation

## 2023-03-30 DIAGNOSIS — R0602 Shortness of breath: Secondary | ICD-10-CM | POA: Diagnosis not present

## 2023-03-30 DIAGNOSIS — N1832 Chronic kidney disease, stage 3b: Secondary | ICD-10-CM | POA: Diagnosis not present

## 2023-03-30 DIAGNOSIS — Z85828 Personal history of other malignant neoplasm of skin: Secondary | ICD-10-CM | POA: Insufficient documentation

## 2023-03-30 DIAGNOSIS — I130 Hypertensive heart and chronic kidney disease with heart failure and stage 1 through stage 4 chronic kidney disease, or unspecified chronic kidney disease: Secondary | ICD-10-CM | POA: Diagnosis not present

## 2023-03-30 DIAGNOSIS — Z1152 Encounter for screening for COVID-19: Secondary | ICD-10-CM | POA: Insufficient documentation

## 2023-03-30 DIAGNOSIS — I10 Essential (primary) hypertension: Secondary | ICD-10-CM | POA: Diagnosis present

## 2023-03-30 DIAGNOSIS — J90 Pleural effusion, not elsewhere classified: Secondary | ICD-10-CM | POA: Diagnosis not present

## 2023-03-30 DIAGNOSIS — R5383 Other fatigue: Secondary | ICD-10-CM | POA: Diagnosis not present

## 2023-03-30 LAB — RESP PANEL BY RT-PCR (RSV, FLU A&B, COVID)  RVPGX2
Influenza A by PCR: NEGATIVE
Influenza B by PCR: NEGATIVE
Resp Syncytial Virus by PCR: NEGATIVE
SARS Coronavirus 2 by RT PCR: NEGATIVE

## 2023-03-30 LAB — CBC WITH DIFFERENTIAL/PLATELET
Abs Immature Granulocytes: 0.05 10*3/uL (ref 0.00–0.07)
Basophils Absolute: 0 10*3/uL (ref 0.0–0.1)
Basophils Relative: 0 %
Eosinophils Absolute: 0.1 10*3/uL (ref 0.0–0.5)
Eosinophils Relative: 1 %
HCT: 20.1 % — ABNORMAL LOW (ref 36.0–46.0)
Hemoglobin: 6.1 g/dL — CL (ref 12.0–15.0)
Immature Granulocytes: 1 %
Lymphocytes Relative: 8 %
Lymphs Abs: 0.4 10*3/uL — ABNORMAL LOW (ref 0.7–4.0)
MCH: 27.1 pg (ref 26.0–34.0)
MCHC: 30.3 g/dL (ref 30.0–36.0)
MCV: 89.3 fL (ref 80.0–100.0)
Monocytes Absolute: 0.5 10*3/uL (ref 0.1–1.0)
Monocytes Relative: 9 %
Neutro Abs: 4.4 10*3/uL (ref 1.7–7.7)
Neutrophils Relative %: 81 %
Platelets: 181 10*3/uL (ref 150–400)
RBC: 2.25 MIL/uL — ABNORMAL LOW (ref 3.87–5.11)
RDW: 16.6 % — ABNORMAL HIGH (ref 11.5–15.5)
WBC: 6.1 10*3/uL (ref 4.0–10.5)
nRBC: 0 % (ref 0.0–0.2)

## 2023-03-30 LAB — PROTIME-INR
INR: 1.5 — ABNORMAL HIGH (ref 0.8–1.2)
Prothrombin Time: 18.2 s — ABNORMAL HIGH (ref 11.4–15.2)

## 2023-03-30 LAB — COMPREHENSIVE METABOLIC PANEL
ALT: 6 U/L (ref 0–44)
AST: 14 U/L — ABNORMAL LOW (ref 15–41)
Albumin: 3.7 g/dL (ref 3.5–5.0)
Alkaline Phosphatase: 88 U/L (ref 38–126)
Anion gap: 13 (ref 5–15)
BUN: 54 mg/dL — ABNORMAL HIGH (ref 8–23)
CO2: 24 mmol/L (ref 22–32)
Calcium: 8.2 mg/dL — ABNORMAL LOW (ref 8.9–10.3)
Chloride: 103 mmol/L (ref 98–111)
Creatinine, Ser: 2.51 mg/dL — ABNORMAL HIGH (ref 0.44–1.00)
GFR, Estimated: 19 mL/min — ABNORMAL LOW (ref >60)
Glucose, Bld: 144 mg/dL — ABNORMAL HIGH (ref 70–99)
Potassium: 3.8 mmol/L (ref 3.5–5.1)
Sodium: 140 mmol/L (ref 135–145)
Total Bilirubin: 0.8 mg/dL (ref <1.2)
Total Protein: 7.1 g/dL (ref 6.5–8.1)

## 2023-03-30 LAB — TROPONIN I (HIGH SENSITIVITY): Troponin I (High Sensitivity): 4 ng/L (ref <18)

## 2023-03-30 LAB — PREPARE RBC (CROSSMATCH)

## 2023-03-30 LAB — LACTIC ACID, PLASMA: Lactic Acid, Venous: 0.7 mmol/L (ref 0.5–1.9)

## 2023-03-30 LAB — PROCALCITONIN: Procalcitonin: <0.1 ng/mL

## 2023-03-30 LAB — OCCULT BLOOD X 1 CARD TO LAB, STOOL: Fecal Occult Bld: NEGATIVE

## 2023-03-30 LAB — BRAIN NATRIURETIC PEPTIDE: B Natriuretic Peptide: 311.9 pg/mL — ABNORMAL HIGH (ref 0.0–100.0)

## 2023-03-30 LAB — MAGNESIUM: Magnesium: 1.4 mg/dL — ABNORMAL LOW (ref 1.7–2.4)

## 2023-03-30 MED ORDER — SODIUM CHLORIDE 0.9 % IV SOLN
1.0000 g | Freq: Once | INTRAVENOUS | Status: AC
  Administered 2023-03-30: 1 g via INTRAVENOUS
  Filled 2023-03-30: qty 10

## 2023-03-30 MED ORDER — ONDANSETRON HCL 4 MG/2ML IJ SOLN: 4.0000 mg | Freq: Four times a day (QID) | INTRAMUSCULAR | Status: DC | PRN

## 2023-03-30 MED ORDER — LEVOTHYROXINE SODIUM 100 MCG PO TABS
100.0000 ug | ORAL_TABLET | Freq: Every day | ORAL | Status: DC
  Administered 2023-03-31: 100 ug via ORAL
  Filled 2023-03-30: qty 1

## 2023-03-30 MED ORDER — ROSUVASTATIN CALCIUM 5 MG PO TABS
5.0000 mg | ORAL_TABLET | Freq: Every day | ORAL | Status: DC
Start: 2023-03-31 — End: 2023-03-31
  Administered 2023-03-31: 5 mg via ORAL
  Filled 2023-03-30: qty 1

## 2023-03-30 MED ORDER — VITAMIN B-12 100 MCG PO TABS
50.0000 ug | ORAL_TABLET | Freq: Every day | ORAL | Status: DC
  Administered 2023-03-31: 50 ug via ORAL
  Filled 2023-03-30: qty 1

## 2023-03-30 MED ORDER — SODIUM CHLORIDE 0.9 % IV SOLN
500.0000 mg | Freq: Once | INTRAVENOUS | Status: AC
  Administered 2023-03-30: 500 mg via INTRAVENOUS
  Filled 2023-03-30: qty 5

## 2023-03-30 MED ORDER — ACETAMINOPHEN 650 MG RE SUPP: 650.0000 mg | Freq: Four times a day (QID) | RECTAL | Status: DC | PRN

## 2023-03-30 MED ORDER — ONDANSETRON HCL 4 MG PO TABS: 4.0000 mg | ORAL_TABLET | Freq: Four times a day (QID) | ORAL | Status: DC | PRN

## 2023-03-30 MED ORDER — NEBIVOLOL HCL 10 MG PO TABS
20.0000 mg | ORAL_TABLET | Freq: Two times a day (BID) | ORAL | Status: DC
  Administered 2023-03-31: 20 mg via ORAL
  Filled 2023-03-30 (×2): qty 2

## 2023-03-30 MED ORDER — LEVOTHYROXINE SODIUM 100 MCG PO CAPS: 1.0000 | ORAL_CAPSULE | Freq: Every day | ORAL | Status: DC

## 2023-03-30 MED ORDER — SODIUM CHLORIDE 0.9 % IV SOLN
500.0000 mg | Freq: Every day | INTRAVENOUS | Status: DC
  Administered 2023-03-31: 500 mg via INTRAVENOUS
  Filled 2023-03-30: qty 5

## 2023-03-30 MED ORDER — HYDRALAZINE HCL 50 MG PO TABS
100.0000 mg | ORAL_TABLET | Freq: Three times a day (TID) | ORAL | Status: DC
Start: 2023-03-30 — End: 2023-03-31
  Administered 2023-03-31: 50 mg via ORAL
  Administered 2023-03-31: 100 mg via ORAL
  Filled 2023-03-30 (×2): qty 2

## 2023-03-30 MED ORDER — SODIUM CHLORIDE 0.9% FLUSH
3.0000 mL | Freq: Two times a day (BID) | INTRAVENOUS | Status: DC
  Administered 2023-03-31: 3 mL via INTRAVENOUS

## 2023-03-30 MED ORDER — THIAMINE MONONITRATE 100 MG PO TABS: 100.0000 mg | ORAL_TABLET | Freq: Every day | ORAL | Status: DC

## 2023-03-30 MED ORDER — LEVOTHYROXINE SODIUM 100 MCG PO TABS: 100.0000 ug | ORAL_TABLET | Freq: Every day | ORAL | Status: DC

## 2023-03-30 MED ORDER — SODIUM CHLORIDE 0.9% IV SOLUTION: Freq: Once | INTRAVENOUS | Status: DC

## 2023-03-30 MED ORDER — ACETAMINOPHEN 325 MG PO TABS: 650.0000 mg | ORAL_TABLET | Freq: Four times a day (QID) | ORAL | Status: DC | PRN

## 2023-03-30 MED ORDER — SODIUM CHLORIDE 0.9 % IV SOLN
2.0000 g | Freq: Every day | INTRAVENOUS | Status: DC
  Administered 2023-03-31: 2 g via INTRAVENOUS
  Filled 2023-03-30: qty 20

## 2023-03-30 NOTE — H&P (Signed)
History and Physical    ELLSIE Joseph RUE:[id number] DOB: Nov 25, 1945 DOA: 03/30/2023  PCP: Alysia Penna, MD  Patient coming from: Home  I have personally briefly reviewed patient's old medical records in [facility]  Chief Complaint: Shortness of breath  HPI: Lynn Joseph is a 77 y.o. female with medical history significant of atrial fibrillation on Eliquis, chronic anemia, hypertension, obesity, OSA, hypothyroidism and pleural effusion presented to ED with complaint of shortness of breath.  Patient having history of left-sided pleural effusion requiring thoracentesis in November last year 2023.  Started having shortness of breath 2 weeks ago, mostly with exertion.  Went to see her pulmonologist on 03/26/2023, x-ray done which showed small left-sided pleural effusion.  No intervention was recommended, no antibiotics were started.  Ever since, patient symptoms have gotten worse so she decided to come to the emergency department today.  She has started having dry cough this morning.  She denies any fever, chills, sweating, tachycardia, chest pain, any problem with urination or with bowel movement.  She traveled to [place] at the end of October.  No sick contact.  Patient denies any hematemesis, melena or hematochezia.  ED Course: Upon arrival to ED, she was fairly hemodynamically stable, she was saturating 90% so she was placed on 2% of oxygen.  Chest x-ray was done which shows left base consolidation and effusion with mild vascular congestion.  She was also found to have hemoglobin of 6.1.  Patient received Rocephin and Zithromax in the ED and admitted under hospitalist service.  Review of Systems: As per HPI otherwise negative.    Past Medical History:  Diagnosis Date   Anemia of chronic disease    Chronic diarrhea    Glaucoma    HTN (hypertension)    Hypertension    Hypothyroid    Obesity    Skin cancer     Past Surgical History:  Procedure Laterality Date    CHOLECYSTECTOMY N/A 03/11/2022   Procedure: LAPAROSCOPIC CHOLECYSTECTOMY;  Surgeon: Fritzi Mandes, MD;  Location: [facility] OR;  Service: General;  Laterality: N/A;   GASTRIC BYPASS     GASTRIC RESTRICTION SURGERY     IR REMOVAL BILIARY DRAIN  04/06/2022   IR SINUS/FIST TUBE CHK-NON GI  03/09/2022   RENAL ANGIOGRAPHY N/A 06/20/2022   Procedure: RENAL ANGIOGRAPHY;  Surgeon: Iran Ouch, MD;  Location: [facility] INVASIVE CV LAB;  Service: Cardiovascular;  Laterality: N/A;   TUBAL LIGATION       reports that she has never smoked. She has never used smokeless tobacco. She reports that she does not currently use alcohol. She reports that she does not use drugs.  Allergies  Allergen Reactions   Amlodipine Other (See Comments) and Swelling    Lowered BP to much, lethargic  Other Reaction(s): Other   Carvedilol Other (See Comments)    Dropped blood pressure to low  Other Reaction(s): Other   Penicillin G Sodium Other (See Comments)    GI upset  Other Reaction(s): Other    Family History  Problem Relation Age of Onset   Hypertension Mother    Thyroid disease Mother    Stroke Mother    Kidney disease Father    Diabetes Father     Prior to Admission medications   Medication Sig Start Date End Date Taking? Authorizing Provider  Acetaminophen (TYLENOL PO) Take 2 tablets by mouth 2 (two) times daily as needed (pain).    [provider]  apixaban (ELIQUIS) 5 MG TABS tablet  TAKE 1 TABLET TWICE A DAY 09/04/22   Antonieta Iba, MD  Calcium-Magnesium-Zinc (CALCIUM-MAGNESUIUM-ZINC PO) Take 1 tablet by mouth daily. Calcium 600 mg Vit b6    [provider]  furosemide (LASIX) 20 MG tablet Take 20 mg by mouth as needed. Take as needed for 4-5 lb wt gain    [provider]  hydrALAZINE (APRESOLINE) 100 MG tablet Take 1 tablet (100 mg total) by mouth 3 (three) times daily. 05/30/22   Antonieta Iba, MD  hydrochlorothiazide (HYDRODIURIL) 12.5 MG tablet Take 12.5 mg by mouth  daily. 09/03/22   [provider]  Levothyroxine Sodium (TIROSINT) 100 MCG CAPS Take 1 capsule (100 mcg total) by mouth daily before breakfast. 03/19/22   Luciano Cutter, MD  Multiple Vitamins-Minerals (MULTIVITAMIN WITH MINERALS) tablet Take 1 tablet by mouth daily.    [provider]  Nebivolol HCl (BYSTOLIC) 20 MG TABS Take 1 tablet (20 mg total) by mouth 2 (two) times daily. 10/23/22   Antonieta Iba, MD  rosuvastatin (CRESTOR) 5 MG tablet Take 1 tablet (5 mg total) by mouth daily. 05/30/22   Antonieta Iba, MD  Thiamine HCl (VITAMIN B-1 PO) Take 100 mg by mouth daily.    [provider]  vitamin B-12 (CYANOCOBALAMIN) 50 MCG tablet Take 50 mcg by mouth daily.    [provider]    Physical Exam: Vitals:   03/30/23 1430 03/30/23 1558 03/30/23 1624 03/30/23 1701  BP: 112/78  (!) 131/45 (!) 120/56  Pulse: 68  67 66  Resp: 15  16 20   Temp:  99.1 F (37.3 C) 99.1 F (37.3 C) 98.7 F (37.1 C)  TempSrc:  Oral Oral Oral  SpO2: 100%  94% 99%  Weight:      Height:        Constitutional: NAD, calm, comfortable Vitals:   03/30/23 1430 03/30/23 1558 03/30/23 1624 03/30/23 1701  BP: 112/78  (!) 131/45 (!) 120/56  Pulse: 68  67 66  Resp: 15  16 20   Temp:  99.1 F (37.3 C) 99.1 F (37.3 C) 98.7 F (37.1 C)  TempSrc:  Oral Oral Oral  SpO2: 100%  94% 99%  Weight:      Height:       Eyes: PERRL, lids and conjunctivae normal ENMT: Mucous membranes are moist. Posterior pharynx clear of any exudate or lesions.Normal dentition.  Neck: normal, supple, no masses, no thyromegaly Respiratory: clear to auscultation bilaterally, no wheezing, no crackles. Normal respiratory effort. No accessory muscle use.  Cardiovascular: Regular rate and rhythm, no murmurs / rubs / gallops. No extremity edema. 2+ pedal pulses. No carotid bruits.  Abdomen: no tenderness, no masses palpated. No hepatosplenomegaly. Bowel sounds positive.  Musculoskeletal: no clubbing /  cyanosis. No joint deformity upper and lower extremities. Good ROM, no contractures. Normal muscle tone.  Skin: no rashes, lesions, ulcers. No induration Neurologic: CN 2-12 grossly intact. Sensation intact, DTR normal. Strength 5/5 in all 4.  Psychiatric: Normal judgment and insight. Alert and oriented x 3. Normal mood.    Labs on Admission: I have personally reviewed following labs and imaging studies  CBC: Recent Labs  Lab 03/30/23 1304  WBC 6.1  NEUTROABS 4.4  HGB 6.1*  HCT 20.1*  MCV 89.3  PLT 181   Basic Metabolic Panel: Recent Labs  Lab 03/30/23 1304  NA 140  K 3.8  CL 103  CO2 24  GLUCOSE 144*  BUN 54*  CREATININE 2.51*  CALCIUM 8.2*   GFR:  Estimated Creatinine Clearance: 22 mL/min (A) (by C-G formula based on SCr of 2.51 mg/dL (H)). Liver Function Tests: Recent Labs  Lab 03/30/23 1304  AST 14*  ALT 6  ALKPHOS 88  BILITOT 0.8  PROT 7.1  ALBUMIN 3.7   No results for input(s): "LIPASE", "AMYLASE" in the last 168 hours. No results for input(s): "AMMONIA" in the last 168 hours. Coagulation Profile: Recent Labs  Lab 03/30/23 1259  INR 1.5*   Cardiac Enzymes: No results for input(s): "CKTOTAL", "CKMB", "CKMBINDEX", "TROPONINI" in the last 168 hours. BNP (last 3 results) No results for input(s): "PROBNP" in the last 8760 hours. HbA1C: No results for input(s): "HGBA1C" in the last 72 hours. CBG: No results for input(s): "GLUCAP" in the last 168 hours. Lipid Profile: No results for input(s): "CHOL", "HDL", "LDLCALC", "TRIG", "CHOLHDL", "LDLDIRECT" in the last 72 hours. Thyroid Function Tests: No results for input(s): "TSH", "T4TOTAL", "FREET4", "T3FREE", "THYROIDAB" in the last 72 hours. Anemia Panel: No results for input(s): "VITAMINB12", "FOLATE", "FERRITIN", "TIBC", "IRON", "RETICCTPCT" in the last 72 hours. Urine analysis:    Component Value Date/Time   COLORURINE YELLOW 03/09/2022 1217   APPEARANCEUR CLEAR 03/09/2022 1217   LABSPEC 1.009  03/09/2022 1217   PHURINE 5.0 03/09/2022 1217   GLUCOSEU NEGATIVE 03/09/2022 1217   HGBUR NEGATIVE 03/09/2022 1217   BILIRUBINUR NEGATIVE 03/09/2022 1217   KETONESUR NEGATIVE 03/09/2022 1217   PROTEINUR NEGATIVE 03/09/2022 1217   NITRITE NEGATIVE 03/09/2022 1217   LEUKOCYTESUR TRACE (A) 03/09/2022 1217    Radiological Exams on Admission: DG Chest 2 View  Result Date: 03/30/2023 CLINICAL DATA:  SOB EXAM: CHEST - 2 VIEW COMPARISON:  03/26/2023. FINDINGS: Left base consolidation and effusion stable finding. Mild pulmonary vascular congestion. No pneumothorax. Cardiac silhouette appears prominent. IMPRESSION: Left base consolidation and effusion. Mild vascular congestion. Electronically Signed   By: Layla Maw M.D.   On: 03/30/2023 15:03    Assessment/Plan Principal Problem:   Acute on chronic anemia Active Problems:   Obesity   OSA (obstructive sleep apnea)   Hypothyroidism   Benign essential hypertension   Acute renal failure superimposed on stage 3b chronic kidney disease (HCC)   CAP (community acquired pneumonia)   Acute respiratory failure with hypoxia (HCC)   Acute respiratory failure with hypoxia secondary to possible community-acquired pneumonia/mild left pleural effusion: Patient has symptoms suggestive of pneumonia with left lower lobe consolidation.  Will check procalcitonin.  Continue Rocephin and Zithromax.  Check a sputum culture.  Blood cultures have been drawn in the ED already.  Will check urine antigen for Legionella and streptococci.  Her pleural effusion is very minimal, does not require thoracentesis at the moment.  Due to AKI, will hold Lasix for now.  Acute on chronic anemia: Baseline hemoglobin remains around 9 due to history of Roux-en-Y surgery in the past.  No history of melena, hematochezia.  FOBT negative in the ED.  Hemoglobin 6.1 now.  Will transfuse 1 unit.  Repeat labs in the morning.  Hold Eliquis.  Paroxysmal atrial fibrillation: Currently in  atrial fibrillation but rate controlled.  Resume beta-blocker but hold Eliquis as mentioned above.  Chronic diastolic congestive heart failure: Echo done in November 2023 shows normal ejection fraction.  Currently although x-ray shows vascular congestion but she has no crackles and her BNP at her baseline.  I do not think she is volume overloaded, instead I think she is dehydrated.  Will hold Lasix.  AKI on CKD stage IIIb: Baseline creatinine around 1.6 with GFR 35,  currently creatinine 2.51.  Could be due to dehydration/anemia.  She appears dry clinically.  Will avoid nephrotoxic agents.  Hopefully with blood transfusion, her creatinine should improve.  Acquired hypothyroidism: Resume Synthroid.  Essential hypertension: Resume hydralazine but hold hydrochlorothiazide and lisinopril due to AKI.  OSA: CPAP at night.  Obesity class III: Weight loss and diet modification counseled.  DVT prophylaxis: SCDs Start: 03/30/23 1730 Code Status: Full code Family Communication: Daughter present at bedside.  Plan of care discussed with patient in length and he verbalized understanding and agreed with it. Disposition Plan: May discharge in next 2 to 3 days Consults called: None  Hughie Closs MD [facility]  *Please note that this is a verbal dictation therefore any spelling or grammatical errors are due to the "[employer] One" system interpretation.  Please page via Amion and do not message via secure chat for urgent patient care matters. Secure chat can be used for non urgent patient care matters. 03/30/2023, 5:31 PM  To contact the attending provider between 7A-7P or the covering provider during after hours 7P-7A, please log into the web site [email or web address]

## 2023-03-30 NOTE — ED Triage Notes (Signed)
Pt via pov from home with sob x 2 weeks; daughter states she was seen and xrayed by pulmonology 12/3 and she has pleural effusion, possible pneumonia. Pt has been low on O2 per her home pulse oximeter. Pt alert & oriented, nad noted.

## 2023-03-30 NOTE — Progress Notes (Signed)
Duplicate VS

## 2023-03-30 NOTE — ED Notes (Signed)
CRITICAL VALUE STICKER  CRITICAL VALUE:Hgb 6.1  RECEIVER (on-site recipient of call):Carmon Ginsberg, RN  DATE & TIME NOTIFIED:   MESSENGER (representative from lab):  MD NOTIFIED: Dr. Dalene Seltzer  TIME OF NOTIFICATION:1330  RESPONSE:

## 2023-03-30 NOTE — ED Notes (Signed)
Received call from [facility], No Current ETA... Truck will be en route ED Nurse will call floor for report Called @ 15:37

## 2023-03-30 NOTE — ED Provider Notes (Signed)
[place] [facility] DEPARTMENT AT [facility] Provider Note   CSN: [id number] Arrival date & time: 03/30/23  1232     History  Chief Complaint  Patient presents with   Shortness of Breath    NALIA STEFKO is a 77 y.o. female past medical history significant for A-fib and anemia of chronic disease presents today for shortness of breath x 2 weeks.  Patient's daughter says that she was seen by pulmonology and had a chest x-ray on 12/3 and was told she had a pleural effusion and possible pneumonia.  Patient is not currently taking any antibiotics.  Patient's daughter also reports dry cough, shortness of breath, fatigue, weakness, and lethargy.  Patient denies edema, orthopnea, fever, chills, nausea, vomiting, diarrhea, abdominal pain, blood in stool, hematuria, or chest pain.  Patient is anticoagulated on Eliquis.  Patient does have a history of CHF but keeps track of her weights and is not significantly up in weight.   Shortness of Breath Associated symptoms: cough        Home Medications Prior to Admission medications   Medication Sig Start Date End Date Taking? Authorizing Provider  Acetaminophen (TYLENOL PO) Take 2 tablets by mouth 2 (two) times daily as needed (pain).    [provider]  apixaban (ELIQUIS) 5 MG TABS tablet TAKE 1 TABLET TWICE A DAY 09/04/22   Antonieta Iba, MD  Calcium-Magnesium-Zinc (CALCIUM-MAGNESUIUM-ZINC PO) Take 1 tablet by mouth daily. Calcium 600 mg Vit b6    [provider]  furosemide (LASIX) 20 MG tablet Take 20 mg by mouth as needed. Take as needed for 4-5 lb wt gain    [provider]  hydrALAZINE (APRESOLINE) 100 MG tablet Take 1 tablet (100 mg total) by mouth 3 (three) times daily. 05/30/22   Antonieta Iba, MD  hydrochlorothiazide (HYDRODIURIL) 12.5 MG tablet Take 12.5 mg by mouth daily. 09/03/22   [provider]  Levothyroxine Sodium (TIROSINT) 100 MCG CAPS Take 1 capsule (100 mcg total) by mouth  daily before breakfast. 03/19/22   Luciano Cutter, MD  Multiple Vitamins-Minerals (MULTIVITAMIN WITH MINERALS) tablet Take 1 tablet by mouth daily.    [provider]  Nebivolol HCl (BYSTOLIC) 20 MG TABS Take 1 tablet (20 mg total) by mouth 2 (two) times daily. 10/23/22   Antonieta Iba, MD  rosuvastatin (CRESTOR) 5 MG tablet Take 1 tablet (5 mg total) by mouth daily. 05/30/22   Antonieta Iba, MD  Thiamine HCl (VITAMIN B-1 PO) Take 100 mg by mouth daily.    [provider]  vitamin B-12 (CYANOCOBALAMIN) 50 MCG tablet Take 50 mcg by mouth daily.    [provider]      Allergies    Amlodipine, Coreg [carvedilol], and Penicillin g sodium    Review of Systems   Review of Systems  Respiratory:  Positive for cough and shortness of breath.     Physical Exam Updated Vital Signs BP 112/78   Pulse 68   Temp 99.1 F (37.3 C) (Oral)   Resp 15   Ht 5\' 5"  (1.651 m)   Wt 100.2 kg   SpO2 100%   BMI 36.76 kg/m  Physical Exam Vitals and nursing note reviewed. Exam conducted with a chaperone present.  Constitutional:      General: She is not in acute distress.    Appearance: She is well-developed.  HENT:     Head: Normocephalic and atraumatic.  Eyes:     Extraocular Movements: Extraocular movements intact.  Conjunctiva/sclera: Conjunctivae normal.     Pupils: Pupils are equal, round, and reactive to light.     Comments: Conjunctival pallor noted bilaterally  Neck:     Vascular: No JVD.  Cardiovascular:     Rate and Rhythm: Normal rate and regular rhythm.     Pulses: Normal pulses.     Heart sounds: No murmur heard. Pulmonary:     Effort: Pulmonary effort is normal. Tachypnea present. No respiratory distress.     Breath sounds: Normal breath sounds. No decreased breath sounds.  Abdominal:     Palpations: Abdomen is soft.     Tenderness: There is no abdominal tenderness.  Genitourinary:    Rectum: Guaiac result negative.  Musculoskeletal:         General: No swelling.     Cervical back: Neck supple.     Right lower leg: No edema.     Left lower leg: No edema.  Skin:    General: Skin is warm and dry.     Capillary Refill: Unable to assess due to nail polish Neurological:     General: No focal deficit present.     Mental Status: She is alert and oriented to person, place, and time.  Psychiatric:        Mood and Affect: Mood normal.     ED Results / Procedures / Treatments   Labs (all labs ordered are listed, but only abnormal results are displayed) Labs Reviewed  PROTIME-INR - Abnormal; Notable for the following components:      Result Value   Prothrombin Time 18.2 (*)    INR 1.5 (*)    All other components within normal limits  CBC WITH DIFFERENTIAL/PLATELET - Abnormal; Notable for the following components:   RBC 2.25 (*)    Hemoglobin 6.1 (*)    HCT 20.1 (*)    RDW 16.6 (*)    Lymphs Abs 0.4 (*)    All other components within normal limits  COMPREHENSIVE METABOLIC PANEL - Abnormal; Notable for the following components:   Glucose, Bld 144 (*)    BUN 54 (*)    Creatinine, Ser 2.51 (*)    Calcium 8.2 (*)    AST 14 (*)    GFR, Estimated 19 (*)    All other components within normal limits  BRAIN NATRIURETIC PEPTIDE - Abnormal; Notable for the following components:   B Natriuretic Peptide 311.9 (*)    All other components within normal limits  CULTURE, BLOOD (ROUTINE X 2)  CULTURE, BLOOD (ROUTINE X 2)  LACTIC ACID, PLASMA  OCCULT BLOOD X 1 CARD TO LAB, STOOL  TROPONIN I (HIGH SENSITIVITY)    EKG None  Radiology DG Chest 2 View  Result Date: 03/30/2023 CLINICAL DATA:  SOB EXAM: CHEST - 2 VIEW COMPARISON:  03/26/2023. FINDINGS: Left base consolidation and effusion stable finding. Mild pulmonary vascular congestion. No pneumothorax. Cardiac silhouette appears prominent. IMPRESSION: Left base consolidation and effusion. Mild vascular congestion. Electronically Signed   By: Layla Maw M.D.   On: 03/30/2023  15:03    Procedures Procedures    Medications Ordered in ED Medications  cefTRIAXone (ROCEPHIN) 1 g in sodium chloride 0.9 % 100 mL IVPB (1 g Intravenous New Bag/Given 03/30/23 1539)  azithromycin (ZITHROMAX) 500 mg in sodium chloride 0.9 % 250 mL IVPB (has no administration in time range)    ED Course/ Medical Decision Making/ A&P  Medical Decision Making Amount and/or Complexity of Data Reviewed Labs: ordered. Radiology: ordered.   This patient presents to the ED with chief complaint(s) of shortness of breath with pertinent past medical history of anemia which further complicates the presenting complaint. The complaint involves an extensive differential diagnosis and also carries with it a high risk of complications and morbidity.    The differential diagnosis includes anemia, pneumonia, CHF exacerbation,  Additional history obtained: Additional history obtained from family Records reviewed Primary Care Documents  ED Course and Reassessment: Patient currently on 2 L via nasal cannula to maintain SpO2 greater than 92%  Independent labs interpretation:  The following labs were independently interpreted:  EKG: Accelerated junctional rhythm, ST and T wave abnormalities CBC: Hemoglobin 6.1 CMP: Elevated bun at 54, creatinine 2.51, hypocalcemia at 8.2, GFR 19 Lactic acid: 0.7 BNP: 311.9 Troponin: 4 Pro time-INR: 18.2 and 1.5 Blood cultures: Pending  Independent visualization of imaging: - I independently visualized the following imaging with scope of interpretation limited to determining acute life threatening conditions related to emergency care: Chest x-ray, which revealed left basilar consolidation and effusion.  Mild vascular congestion.  Consultation: - Consulted or discussed management/test interpretation w/ external professional: Hospitalist, Dr. Mahala Menghini agreed to admission for anemia  Consideration for admission or further workup:  Admission for anemia         Final Clinical Impression(s) / ED Diagnoses Final diagnoses:  Anemia, unspecified type    Rx / DC Orders ED Discharge Orders     None         Dolphus Jenny, PA-C 03/30/23 1611    Alvira Monday, MD 04/01/23 1134

## 2023-03-30 NOTE — ED Notes (Signed)
Dr Dalene Seltzer requests repeat EKG when pt is roomed.

## 2023-03-30 NOTE — Progress Notes (Signed)
   03/30/23 2315  BiPAP/CPAP/SIPAP  BiPAP/CPAP/SIPAP Pt Type Adult (had to give hospital machine due to needing O2)  BiPAP/CPAP/SIPAP Resmed  Mask Type Full face mask  FiO2 (%) 28 %  Flow Rate 2 lpm  Patient Home Equipment No  Auto Titrate  (5-20 cmH2O)  BiPAP/CPAP /SiPAP Vitals  Resp 16  MEWS Score/Color  MEWS Score 0  MEWS Score Color Green

## 2023-03-30 NOTE — ED Notes (Signed)
Two sets of blood cultures obtained by this RN.

## 2023-03-30 NOTE — Progress Notes (Signed)
   03/30/23 2032  BiPAP/CPAP/SIPAP  BiPAP/CPAP/SIPAP Pt Type Adult (daughter set up machine for patient)  FiO2 (%) 21 %  Patient Home Equipment Yes  BiPAP/CPAP /SiPAP Vitals  Resp 20  MEWS Score/Color  MEWS Score 0  MEWS Score Color Green

## 2023-03-31 DIAGNOSIS — D649 Anemia, unspecified: Secondary | ICD-10-CM | POA: Diagnosis not present

## 2023-03-31 DIAGNOSIS — J189 Pneumonia, unspecified organism: Secondary | ICD-10-CM | POA: Diagnosis present

## 2023-03-31 DIAGNOSIS — D62 Acute posthemorrhagic anemia: Secondary | ICD-10-CM | POA: Diagnosis not present

## 2023-03-31 LAB — STREP PNEUMONIAE URINARY ANTIGEN: Strep Pneumo Urinary Antigen: NEGATIVE

## 2023-03-31 LAB — BASIC METABOLIC PANEL
Anion gap: 10 (ref 5–15)
Anion gap: 9 (ref 5–15)
BUN: 54 mg/dL — ABNORMAL HIGH (ref 8–23)
BUN: 50 mg/dL — ABNORMAL HIGH (ref 8–23)
CO2: 24 mmol/L (ref 22–32)
CO2: 23 mmol/L (ref 22–32)
Calcium: 7.6 mg/dL — ABNORMAL LOW (ref 8.9–10.3)
Calcium: 7.3 mg/dL — ABNORMAL LOW (ref 8.9–10.3)
Chloride: 104 mmol/L (ref 98–111)
Chloride: 99 mmol/L (ref 98–111)
Creatinine, Ser: 2.36 mg/dL — ABNORMAL HIGH (ref 0.44–1.00)
Creatinine, Ser: 1.99 mg/dL — ABNORMAL HIGH (ref 0.44–1.00)
GFR, Estimated: 21 mL/min — ABNORMAL LOW (ref >60)
GFR, Estimated: 25 mL/min — ABNORMAL LOW (ref >60)
Glucose, Bld: 90 mg/dL (ref 70–99)
Glucose, Bld: 101 mg/dL — ABNORMAL HIGH (ref 70–99)
Potassium: 3.7 mmol/L (ref 3.5–5.1)
Potassium: 3.5 mmol/L (ref 3.5–5.1)
Sodium: 138 mmol/L (ref 135–145)
Sodium: 131 mmol/L — ABNORMAL LOW (ref 135–145)

## 2023-03-31 LAB — CBC
HCT: 19.6 % — ABNORMAL LOW (ref 36.0–46.0)
Hemoglobin: 6 g/dL — CL (ref 12.0–15.0)
MCH: 27.1 pg (ref 26.0–34.0)
MCHC: 30.6 g/dL (ref 30.0–36.0)
MCV: 88.7 fL (ref 80.0–100.0)
Platelets: 165 10*3/uL (ref 150–400)
RBC: 2.21 MIL/uL — ABNORMAL LOW (ref 3.87–5.11)
RDW: 16.5 % — ABNORMAL HIGH (ref 11.5–15.5)
WBC: 5.6 10*3/uL (ref 4.0–10.5)
nRBC: 0 % (ref 0.0–0.2)

## 2023-03-31 LAB — HEMOGLOBIN AND HEMATOCRIT, BLOOD
HCT: 25 % — ABNORMAL LOW (ref 36.0–46.0)
Hemoglobin: 7.9 g/dL — ABNORMAL LOW (ref 12.0–15.0)

## 2023-03-31 LAB — PREPARE RBC (CROSSMATCH)

## 2023-03-31 MED ORDER — CYANOCOBALAMIN 1000 MCG/ML IJ SOLN
1000.0000 ug | Freq: Once | INTRAMUSCULAR | Status: AC
  Administered 2023-03-31: 1000 ug via INTRAMUSCULAR
  Filled 2023-03-31: qty 1

## 2023-03-31 MED ORDER — APIXABAN 5 MG PO TABS: 5.0000 mg | ORAL_TABLET | Freq: Two times a day (BID) | ORAL | Status: DC

## 2023-03-31 MED ORDER — FERROUS SULFATE 325 (65 FE) MG PO TABS: 325.0000 mg | ORAL_TABLET | Freq: Every day | ORAL | 0 refills | Status: DC

## 2023-03-31 MED ORDER — CEFDINIR 300 MG PO CAPS: 300.0000 mg | ORAL_CAPSULE | Freq: Two times a day (BID) | ORAL | 0 refills | Status: AC

## 2023-03-31 MED ORDER — SODIUM CHLORIDE 0.9% IV SOLUTION: Freq: Once | INTRAVENOUS | Status: DC

## 2023-03-31 NOTE — Progress Notes (Signed)
Patient discharged: Home with family  Via: Wheelchair   Discharge paperwork given: to patient and family  Reviewed with teach back  IV and telemetry disconnected  Belongings given to patient    

## 2023-03-31 NOTE — Discharge Summary (Signed)
Physician Discharge Summary  Lynn Joseph ZOX:[id number] DOB: Mar 30, 1946 DOA: 03/30/2023  PCP: Alysia Penna, MD  Admit date: 03/30/2023 Discharge date: 03/31/2023 30 Day Unplanned Readmission Risk Score    Flowsheet Row ED to Hosp-Admission (Current) from 03/30/2023 in [facility]  30 Day Unplanned Readmission Risk Score (%) 16.59 Filed at 03/31/2023 1200       This score is the patient's risk of an unplanned readmission within 30 days of being discharged (0 -100%). The score is based on dignosis, age, lab data, medications, orders, and past utilization.   Low:  0-14.9   Medium: 15-21.9   High: 22-29.9   Extreme: 30 and above          Admitted From: Home Disposition: Home Home  Recommendations for Outpatient Follow-up:  Follow up with PCP in 1-2 weeks Please obtain BMP/CBC in one week Please follow up with your PCP on the following pending results: Unresulted Labs (From admission, onward)     Start     Ordered   03/31/23 1254  Iron and TIBC  Add-on,   AD        03/31/23 1253   03/31/23 1254  Folate  Add-on,   AD        03/31/23 1253   03/31/23 1254  Vitamin B12  Add-on,   AD        03/31/23 1253   03/31/23 0232  Expectorated Sputum Assessment w Gram Stain, Rflx to Resp Cult  Once,   R        03/31/23 0232   03/30/23 1729  Legionella Pneumophila Serogp 1 Ur Ag  (COPD / Pneumonia / Cellulitis / Lower Extremity Wound)  Once,   R        03/30/23 1730              Home Health: None Equipment/Devices: None  Discharge Condition: Stable CODE STATUS: Full code Diet recommendation: Cardiac  HPI: Lynn Joseph is a 77 y.o. female with medical history significant of atrial fibrillation on Eliquis, chronic anemia, hypertension, obesity, OSA, hypothyroidism and pleural effusion presented to ED with complaint of shortness of breath.  Patient having history of left-sided pleural effusion requiring thoracentesis in November last  year 2023.  Started having shortness of breath 2 weeks ago, mostly with exertion.  Went to see her pulmonologist on 03/26/2023, x-ray done which showed small left-sided pleural effusion.  No intervention was recommended, no antibiotics were started.  Ever since, patient symptoms have gotten worse so she decided to come to the emergency department today.  She has started having dry cough this morning.  She denies any fever, chills, sweating, tachycardia, chest pain, any problem with urination or with bowel movement.  She traveled to [place] at the end of October.  No sick contact.  Patient denies any hematemesis, melena or hematochezia.   ED Course: Upon arrival to ED, she was fairly hemodynamically stable, she was saturating 90% so she was placed on 2% of oxygen.  Chest x-ray was done which shows left base consolidation and effusion with mild vascular congestion.  She was also found to have hemoglobin of 6.1.  Patient received Rocephin and Zithromax in the ED and admitted under hospitalist service.  Subjective: Seen and examined.  She is feeling much better today.  No shortness of breath or any other complaint.  She wants to go home.  Although her daughter who is at the bedside has some concerns with her going  home only because she had pleural effusion last year which was drained and she had reaccumulated again however patient is not concerned about that and has decided that she would like to go home.  I addressed several questions asked by the daughter at bedside today.  Brief/Interim Summary: Patient was admitted overnight under hospital service with acute respiratory failure with hypoxia secondary to possible [facility] pneumonia/mild left pleural effusion.  She was started on Rocephin and Zithromax.  Cultures were drawn which are negative to date.  Patient was weaned to room air and is doing well without any symptoms.  For pneumonia, I will prescribe her 5 days of oral cefdinir.  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  No history of melena, hematochezia.  FOBT negative in the ED.  Hemoglobin 6.1 upon presentation, received 1 unit of PRBC transfusion, hemoglobin 7.9 today.  Per daughter, she is supposed to take B12 and iron tablets but patient is very hesitant about taking medications.  I reviewed labs from last year November 2023, her B12 level low normal side, iron studies were unremarkable.  Daughter has requested to give her B12 injection today so she does not have to take any pills for next month and from here on, she will ask her PCP to get her B12 injections every month.  B12, folate and iron studies labs are still pending here.   Paroxysmal atrial fibrillation: Currently in atrial fibrillation but rate controlled.  Resume beta-blocker but hold Eliquis today and resume tomorrow.   Chronic diastolic congestive heart failure: Echo done in November 2023 shows normal ejection fraction.  Currently although x-ray shows vascular congestion but she has no crackles and her BNP at her baseline.  She was not volume overloaded, in fact she appeared dry yesterday.   AKI on CKD stage IIIb: Baseline creatinine around 1.6-1.7 with GFR 35, scented with creatinine 2.51.  Could be due to dehydration/anemia.  Her creatinine improved to 2.0.  I have discontinued her hydrochlorothiazide.   Acquired hypothyroidism: Resume Synthroid.   Essential hypertension: We held her hydrochlorothiazide and blood pressure is within normal range.  Due to CKD, I have decided to discontinue her hydrochlorothiazide completely, she will resume hydralazine.   OSA: CPAP at night.   Obesity class III: Weight loss and diet modification counseled.  Discharge plan was discussed with patient and/or family member and they verbalized understanding and agreed with it.  Discharge Diagnoses:  Principal Problem:   Acute on chronic anemia Active Problems:   Obesity   OSA  (obstructive sleep apnea)   Hypothyroidism   Benign essential hypertension   Acute renal failure superimposed on stage 3b chronic kidney disease (HCC)   CAP (community acquired pneumonia)   Acute respiratory failure with hypoxia (HCC)    Discharge Instructions   Allergies as of 03/31/2023       Reactions   Amlodipine Swelling, Other (See Comments)   Lowered B/P too much, made lethargic, also   Carvedilol Other (See Comments)   Dropped blood pressure too low   Penicillin G Sodium Other (See Comments)   GI upset        Medication List     STOP taking these medications    hydrochlorothiazide 12.5 MG tablet Commonly known as: HYDRODIURIL       TAKE these medications    allopurinol 100 MG tablet Commonly known as: ZYLOPRIM Take 100 mg by mouth daily.   apixaban 5 MG  Tabs tablet Commonly known as: Eliquis Take 1 tablet (5 mg total) by mouth 2 (two) times daily. Start taking on: April 01, 2023   CALCIUM-MAGNESUIUM-ZINC PO Take 1 tablet by mouth 2 (two) times a week.   cefdinir 300 MG capsule Commonly known as: OMNICEF Take 1 capsule (300 mg total) by mouth 2 (two) times daily for 5 days.   febuxostat 40 MG tablet Commonly known as: ULORIC Take 40 mg by mouth daily.   ferrous sulfate 325 (65 FE) MG tablet Take 1 tablet (325 mg total) by mouth daily with breakfast.   furosemide 20 MG tablet Commonly known as: LASIX Take 20 mg by mouth daily as needed (for a weight gain of 4-5 pounds in the period of a couple of days).   gabapentin 100 MG capsule Commonly known as: NEURONTIN Take 100 mg by mouth at bedtime.   hydrALAZINE 100 MG tablet Commonly known as: APRESOLINE Take 1 tablet (100 mg total) by mouth 3 (three) times daily.   Levothyroxine Sodium 100 MCG Caps Commonly known as: Tirosint Take 1 capsule (100 mcg total) by mouth daily before breakfast. What changed:  when to take this additional instructions   Nebivolol HCl 20 MG Tabs Commonly  known as: Bystolic Take 1 tablet (20 mg total) by mouth 2 (two) times daily.   rosuvastatin 5 MG tablet Commonly known as: CRESTOR Take 1 tablet (5 mg total) by mouth daily.   TYLENOL 500 MG tablet Generic drug: acetaminophen Take 1,000 mg by mouth every 6 (six) hours as needed for mild pain (pain score 1-3), headache or fever.   vitamin B-12 50 MCG tablet Commonly known as: CYANOCOBALAMIN Take 50 mcg by mouth 2 (two) times a week.        Follow-up Information     Alysia Penna, MD Follow up in 1 week(s).   Specialty: Internal Medicine Contact information: [address] [place] [address] [phone]                Allergies  Allergen Reactions   Amlodipine Swelling and Other (See Comments)    Lowered B/P too much, made lethargic, also     Carvedilol Other (See Comments)    Dropped blood pressure too low   Penicillin G Sodium Other (See Comments)    GI upset    Consultations: None   Procedures/Studies: DG Chest 2 View  Result Date: 03/30/2023 CLINICAL DATA:  SOB EXAM: CHEST - 2 VIEW COMPARISON:  03/26/2023. FINDINGS: Left base consolidation and effusion stable finding. Mild pulmonary vascular congestion. No pneumothorax. Cardiac silhouette appears prominent. IMPRESSION: Left base consolidation and effusion. Mild vascular congestion. Electronically Signed   By: Layla Maw M.D.   On: 03/30/2023 15:03   DG Chest 2 View  Result Date: 03/26/2023 CLINICAL DATA:  Short of breath for 1 and half weeks. EXAM: CHEST - 2 VIEW COMPARISON:  05/14/2022.  CT, 03/08/2022. FINDINGS: Cardiac silhouette top-normal in size. No mediastinal or hilar masses. No evidence of adenopathy. Left lower lobe opacity obscures portions of the left hemidiaphragm. Remainder of the lungs is clear. Possible small left effusion. No right pleural effusion. No pneumothorax. Skeletal structures are intact. IMPRESSION: 1. Left lower lobe opacity consistent with atelectasis or pneumonia.  Electronically Signed   By: Amie Portland M.D.   On: 03/26/2023 10:55     Discharge Exam: Vitals:   03/31/23 0623 03/31/23 0731  BP: (!) 107/48 (!) 143/62  Pulse: 61 64  Resp: 18 20  Temp: 97.7 F (36.5  C) 98.1 F (36.7 C)  SpO2: 94% 98%   Vitals:   03/31/23 0346 03/31/23 0500 03/31/23 0623 03/31/23 0731  BP: (!) 114/59 (!) 119/48 (!) 107/48 (!) 143/62  Pulse: 62 65 61 64  Resp: 17  18 20   Temp: 99.2 F (37.3 C) 98.6 F (37 C) 97.7 F (36.5 C) 98.1 F (36.7 C)  TempSrc:  Axillary Axillary Oral  SpO2:  95% 94% 98%  Weight:      Height:        General: Pt is alert, awake, not in acute distress Cardiovascular: RRR, S1/S2 +, no rubs, no gallops Respiratory: Slightly diminished breath sounds at the left base.  No wheezing, no rhonchi Abdominal: Soft, NT, ND, bowel sounds + Extremities: no edema, no cyanosis    The results of significant diagnostics from this hospitalization (including imaging, microbiology, ancillary and laboratory) are listed below for reference.     Microbiology: Recent Results (from the past 240 hour(s))  Blood culture (routine x 2)     Status: None (Preliminary result)   Collection Time: 03/30/23  1:47 PM   Specimen: BLOOD  Result Value Ref Range Status   Specimen Description   Final    BLOOD RIGHT ANTECUBITAL Performed at [facility], [address] [place], [place] [address]    Special Requests   Final    Blood Culture results may not be optimal due to an excessive volume of blood received in culture bottles BOTTLES DRAWN AEROBIC AND ANAEROBIC Performed at [facility], [address], [place], [place] [address]    Culture   Final    NO GROWTH < 24 HOURS Performed at [facility], [address], [place], [place] [address]    Report Status PENDING  Incomplete  Blood culture (routine x 2)     Status: None (Preliminary result)   Collection Time: 03/30/23  2:06 PM   Specimen: BLOOD  Result  Value Ref Range Status   Specimen Description   Final    BLOOD LEFT ANTECUBITAL Performed at [facility], [address] [place], [place] [address]    Special Requests   Final    Blood Culture adequate volume BOTTLES DRAWN AEROBIC AND ANAEROBIC Performed at [facility], [address], [place], [place] [address]    Culture   Final    NO GROWTH < 24 HOURS Performed at [facility], [address] [place], [place] [address]    Report Status PENDING  Incomplete  Resp panel by RT-PCR (RSV, Flu A&B, Covid) Anterior Nasal Swab     Status: None   Collection Time: 03/30/23  5:29 PM   Specimen: Anterior Nasal Swab  Result Value Ref Range Status   SARS Coronavirus 2 by RT PCR NEGATIVE NEGATIVE Final    Comment: (NOTE) SARS-CoV-2 target nucleic acids are NOT DETECTED.  The SARS-CoV-2 RNA is generally detectable in upper respiratory specimens during the acute phase of infection. The lowest concentration of SARS-CoV-2 viral copies this assay can detect is 138 copies/mL. A negative result does not preclude SARS-Cov-2 infection and should not be used as the sole basis for treatment or other patient management decisions. A negative result may occur with  improper specimen collection/handling, submission of specimen other than nasopharyngeal swab, presence of viral mutation(s) within the areas targeted by this assay, and inadequate number of viral copies(<138 copies/mL). A negative result must be combined with clinical observations, patient history, and epidemiological information. The expected result is  Negative.  Fact Sheet for Patients:  BloggerCourse.com  Fact Sheet for Healthcare Providers:  SeriousBroker.it  This test is no t yet approved or cleared by the [place] FDA and  has been authorized for detection and/or diagnosis of SARS-CoV-2 by FDA under an Emergency Use Authorization  (EUA). This EUA will remain  in effect (meaning this test can be used) for the duration of the COVID-19 declaration under Section 564(b)(1) of the Act, 21 U.S.C.section 360bbb-3(b)(1), unless the authorization is terminated  or revoked sooner.       Influenza A by PCR NEGATIVE NEGATIVE Final   Influenza B by PCR NEGATIVE NEGATIVE Final    Comment: (NOTE) The Xpert Xpress SARS-CoV-2/FLU/RSV plus assay is intended as an aid in the diagnosis of influenza from Nasopharyngeal swab specimens and should not be used as a sole basis for treatment. Nasal washings and aspirates are unacceptable for Xpert Xpress SARS-CoV-2/FLU/RSV testing.  Fact Sheet for Patients: BloggerCourse.com  Fact Sheet for Healthcare Providers: SeriousBroker.it  This test is not yet approved or cleared by the [place] FDA and has been authorized for detection and/or diagnosis of SARS-CoV-2 by FDA under an Emergency Use Authorization (EUA). This EUA will remain in effect (meaning this test can be used) for the duration of the COVID-19 declaration under Section 564(b)(1) of the Act, 21 U.S.C. section 360bbb-3(b)(1), unless the authorization is terminated or revoked.     Resp Syncytial Virus by PCR NEGATIVE NEGATIVE Final    Comment: (NOTE) Fact Sheet for Patients: BloggerCourse.com  Fact Sheet for Healthcare Providers: SeriousBroker.it  This test is not yet approved or cleared by the [place] FDA and has been authorized for detection and/or diagnosis of SARS-CoV-2 by FDA under an Emergency Use Authorization (EUA). This EUA will remain in effect (meaning this test can be used) for the duration of the COVID-19 declaration under Section 564(b)(1) of the Act, 21 U.S.C. section 360bbb-3(b)(1), unless the authorization is terminated or revoked.  Performed at [facility], [address], [place], [place] [address]      Labs: BNP (last 3 results) Recent Labs    03/30/23 1305  BNP 311.9*   Basic Metabolic Panel: Recent Labs  Lab 03/30/23 1304 03/30/23 1820 03/31/23 0030 03/31/23 1032  NA 140  --  138 131*  K 3.8  --  3.7 3.5  CL 103  --  104 99  CO2 24  --  24 23  GLUCOSE 144*  --  90 101*  BUN 54*  --  54* 50*  CREATININE 2.51*  --  2.36* 1.99*  CALCIUM 8.2*  --  7.6* 7.3*  MG  --  1.4*  --   --    Liver Function Tests: Recent Labs  Lab 03/30/23 1304  AST 14*  ALT 6  ALKPHOS 88  BILITOT 0.8  PROT 7.1  ALBUMIN 3.7   No results for input(s): "LIPASE", "AMYLASE" in the last 168 hours. No results for input(s): "AMMONIA" in the last 168 hours. CBC: Recent Labs  Lab 03/30/23 1304 03/31/23 0030 03/31/23 0808  WBC 6.1 5.6  --   NEUTROABS 4.4  --   --   HGB 6.1* 6.0* 7.9*  HCT 20.1* 19.6* 25.0*  MCV 89.3 88.7  --   PLT 181 165  --    Cardiac Enzymes: No results for input(s): "CKTOTAL", "CKMB", "CKMBINDEX", "TROPONINI" in the last 168 hours. BNP: Invalid input(s): "POCBNP" CBG: No results for input(s): "GLUCAP" in the last 168 hours. D-Dimer  No results for input(s): "DDIMER" in the last 72 hours. Hgb A1c No results for input(s): "HGBA1C" in the last 72 hours. Lipid Profile No results for input(s): "CHOL", "HDL", "LDLCALC", "TRIG", "CHOLHDL", "LDLDIRECT" in the last 72 hours. Thyroid function studies No results for input(s): "TSH", "T4TOTAL", "T3FREE", "THYROIDAB" in the last 72 hours.  Invalid input(s): "FREET3" Anemia work up No results for input(s): "VITAMINB12", "FOLATE", "FERRITIN", "TIBC", "IRON", "RETICCTPCT" in the last 72 hours. Urinalysis    Component Value Date/Time   COLORURINE YELLOW 03/09/2022 1217   APPEARANCEUR CLEAR 03/09/2022 1217   LABSPEC 1.009 03/09/2022 1217   PHURINE 5.0 03/09/2022 1217   GLUCOSEU NEGATIVE 03/09/2022 1217   HGBUR NEGATIVE 03/09/2022 1217   BILIRUBINUR NEGATIVE 03/09/2022 1217   KETONESUR  NEGATIVE 03/09/2022 1217   PROTEINUR NEGATIVE 03/09/2022 1217   NITRITE NEGATIVE 03/09/2022 1217   LEUKOCYTESUR TRACE (A) 03/09/2022 1217   Sepsis Labs Recent Labs  Lab 03/30/23 1304 03/31/23 0030  WBC 6.1 5.6   Microbiology Recent Results (from the past 240 hour(s))  Blood culture (routine x 2)     Status: None (Preliminary result)   Collection Time: 03/30/23  1:47 PM   Specimen: BLOOD  Result Value Ref Range Status   Specimen Description   Final    BLOOD RIGHT ANTECUBITAL Performed at [facility], [address] [place], [place] [address]    Special Requests   Final    Blood Culture results may not be optimal due to an excessive volume of blood received in culture bottles BOTTLES DRAWN AEROBIC AND ANAEROBIC Performed at [facility], [address] [place], [place] [address]    Culture   Final    NO GROWTH < 24 HOURS Performed at [facility], [address] [place], [place] [address]    Report Status PENDING  Incomplete  Blood culture (routine x 2)     Status: None (Preliminary result)   Collection Time: 03/30/23  2:06 PM   Specimen: BLOOD  Result Value Ref Range Status   Specimen Description   Final    BLOOD LEFT ANTECUBITAL Performed at [facility], [address], [place], [place] [address]    Special Requests   Final    Blood Culture adequate volume BOTTLES DRAWN AEROBIC AND ANAEROBIC Performed at [facility], [address] [place], [place] [address]    Culture   Final    NO GROWTH < 24 HOURS Performed at [facility], [address], [place], [place] [address]    Report Status PENDING  Incomplete  Resp panel by RT-PCR (RSV, Flu A&B, Covid) Anterior Nasal Swab     Status: None   Collection Time: 03/30/23  5:29 PM   Specimen: Anterior Nasal Swab  Result Value Ref Range Status   SARS Coronavirus 2 by RT PCR NEGATIVE NEGATIVE Final    Comment: (NOTE) SARS-CoV-2  target nucleic acids are NOT DETECTED.  The SARS-CoV-2 RNA is generally detectable in upper respiratory specimens during the acute phase of infection. The lowest concentration of SARS-CoV-2 viral copies this assay can detect is 138 copies/mL. A negative result does not preclude SARS-Cov-2 infection and should not be used as the sole basis for treatment or other patient management decisions. A negative result may occur with  improper specimen collection/handling, submission of specimen other than nasopharyngeal swab, presence of viral mutation(s) within the areas targeted by this assay, and inadequate number of viral copies(<138 copies/mL). A negative result must be combined  with clinical observations, patient history, and epidemiological information. The expected result is Negative.  Fact Sheet for Patients:  BloggerCourse.com  Fact Sheet for Healthcare Providers:  SeriousBroker.it  This test is no t yet approved or cleared by the [place] FDA and  has been authorized for detection and/or diagnosis of SARS-CoV-2 by FDA under an Emergency Use Authorization (EUA). This EUA will remain  in effect (meaning this test can be used) for the duration of the COVID-19 declaration under Section 564(b)(1) of the Act, 21 U.S.C.section 360bbb-3(b)(1), unless the authorization is terminated  or revoked sooner.       Influenza A by PCR NEGATIVE NEGATIVE Final   Influenza B by PCR NEGATIVE NEGATIVE Final    Comment: (NOTE) The Xpert Xpress SARS-CoV-2/FLU/RSV plus assay is intended as an aid in the diagnosis of influenza from Nasopharyngeal swab specimens and should not be used as a sole basis for treatment. Nasal washings and aspirates are unacceptable for Xpert Xpress SARS-CoV-2/FLU/RSV testing.  Fact Sheet for Patients: BloggerCourse.com  Fact Sheet for Healthcare  Providers: SeriousBroker.it  This test is not yet approved or cleared by the [place] FDA and has been authorized for detection and/or diagnosis of SARS-CoV-2 by FDA under an Emergency Use Authorization (EUA). This EUA will remain in effect (meaning this test can be used) for the duration of the COVID-19 declaration under Section 564(b)(1) of the Act, 21 U.S.C. section 360bbb-3(b)(1), unless the authorization is terminated or revoked.     Resp Syncytial Virus by PCR NEGATIVE NEGATIVE Final    Comment: (NOTE) Fact Sheet for Patients: BloggerCourse.com  Fact Sheet for Healthcare Providers: SeriousBroker.it  This test is not yet approved or cleared by the [place] FDA and has been authorized for detection and/or diagnosis of SARS-CoV-2 by FDA under an Emergency Use Authorization (EUA). This EUA will remain in effect (meaning this test can be used) for the duration of the COVID-19 declaration under Section 564(b)(1) of the Act, 21 U.S.C. section 360bbb-3(b)(1), unless the authorization is terminated or revoked.  Performed at [facility], [address] [place], [place] [address]     FURTHER DISCHARGE INSTRUCTIONS:   Get Medicines reviewed and adjusted: Please take all your medications with you for your next visit with your Primary MD   Laboratory/radiological data: Please request your Primary MD to go over all hospital tests and procedure/radiological results at the follow up, please ask your Primary MD to get all Hospital records sent to his/her office.   In some cases, they will be blood work, cultures and biopsy results pending at the time of your discharge. Please request that your primary care M.D. goes through all the records of your hospital data and follows up on these results.   Also Note the following: If you experience worsening of your admission symptoms,  develop shortness of breath, life threatening emergency, suicidal or homicidal thoughts you must seek medical attention immediately by calling 911 or calling your MD immediately  if symptoms less severe.   You must read complete instructions/literature along with all the possible adverse reactions/side effects for all the Medicines you take and that have been prescribed to you. Take any new Medicines after you have completely understood and accpet all the possible adverse reactions/side effects.    Do not drive when taking Pain medications or sleeping medications (Benzodaizepines)   Do not take more than prescribed Pain, Sleep and Anxiety Medications. It is not advisable to combine anxiety,sleep and pain medications without talking with  your primary care practitioner   Special Instructions: If you have smoked or chewed Tobacco  in the last 2 yrs please stop smoking, stop any regular Alcohol  and or any Recreational drug use.   Wear Seat belts while driving.   Please note: You were cared for by a hospitalist during your hospital stay. Once you are discharged, your primary care physician will handle any further medical issues. Please note that NO REFILLS for any discharge medications will be authorized once you are discharged, as it is imperative that you return to your primary care physician (or establish a relationship with a primary care physician if you do not have one) for your post hospital discharge needs so that they can reassess your need for medications and monitor your lab values  Time coordinating discharge: Over 30 minutes  SIGNED:   Hughie Closs, MD  [facility] 03/31/2023, 1:01 PM *Please note that this is a verbal dictation therefore any spelling or grammatical errors are due to the "Dragon Medical One" system interpretation. If 7PM-7AM, please contact night-coverage [email or web address]

## 2023-03-31 NOTE — TOC Initial Note (Signed)
Transition of Care [facility]) - Initial/Assessment Note    Patient Details  Name: Lynn Joseph MRN: [id number] Date of Birth: [birth year]  [facility] CM/SW Contact:    Adrian Prows, RN Phone Number: 03/31/2023, 2:03 PM  Clinical Narrative:                 [name] w/ pt and dtr Lynn Joseph in room; pt says she lives at home; she plans to return at d/c; pt verified she has insurance/PCP; she has transportation; pt denies SDOH risks; she has a cane; pt says she does not have HH services or home oxygen; no TOC needs.  Expected Discharge Plan: Home/Self Care Barriers to Discharge: No Barriers Identified   Patient Goals and CMS Choice Patient states their goals for this hospitalization and ongoing recovery are:: home CMS Medicare.gov Compare Post Acute Care list provided to:: Patient        Expected Discharge Plan and Services   Discharge Planning Services: CM Consult     Expected Discharge Date: 03/31/23               DME Arranged: N/A DME Agency: NA       HH Arranged: NA HH Agency: NA        Prior Living Arrangements/Services   Lives with:: Self Patient language and need for interpreter reviewed:: Yes Do you feel safe going back to the place where you live?: Yes      Need for Family Participation in Patient Care: Yes (Comment) Care giver support system in place?: Yes (comment) Current home services: DME (cane) Criminal Activity/Legal Involvement Pertinent to Current Situation/Hospitalization: No - Comment as needed  Activities of Daily Living   ADL Screening (condition at time of admission) Independently performs ADLs?: Yes (appropriate for developmental age) Is the patient deaf or have difficulty hearing?: No Does the patient have difficulty seeing, even when wearing glasses/contacts?: No Does the patient have difficulty concentrating, remembering, or making decisions?: No  Permission Sought/Granted Permission sought to share information  with : Case Manager Permission granted to share information with : Yes, Verbal Permission Granted  Share Information with NAME: Case Manager     Permission granted to share info w Relationship: Lynn Joseph (dtr) [phone]     Emotional Assessment Appearance:: Appears stated age Attitude/Demeanor/Rapport: Gracious Affect (typically observed): Accepting Orientation: : Oriented to Self, Oriented to Place, Oriented to  Time, Oriented to Situation Alcohol / Substance Use: Not Applicable Psych Involvement: No (comment)  Admission diagnosis:  Anemia [D64.9] Anemia, unspecified type [D64.9] Community acquired pneumonia [J18.9] Patient Active Problem List   Diagnosis Date Noted   Community acquired pneumonia 03/31/2023   Acute on chronic anemia 03/30/2023   CAP (community acquired pneumonia) 03/30/2023   Acute respiratory failure with hypoxia (HCC) 03/30/2023   Nocturnal hypoxemia 05/14/2022   Pleural effusion on right 03/14/2022   CKD (chronic kidney disease) stage 3, GFR 30-59 ml/min (HCC) 03/14/2022   New onset atrial fibrillation (HCC) 03/08/2022   Acute cholecystitis 03/08/2022   Acute renal failure superimposed on stage 3b chronic kidney disease (HCC) 03/08/2022   Intestinal malabsorption 02/24/2020   Hypothyroidism 02/24/2020   Benign essential hypertension 02/24/2020   Anemia of chronic disease 02/24/2020   Adult health examination declined 02/24/2020   Primary osteoarthritis of left knee 09/23/2019   Primary osteoarthritis of right knee 09/23/2019   SOB (shortness of breath) 10/28/2018   Dyspnea 02/15/2016   OSA (obstructive sleep apnea) 03/07/2015   UARS (upper airway resistance  syndrome) 03/07/2015   Snoring 01/31/2015   Sleep apnea 01/31/2015   Hypoxemia 01/31/2015   Obesity 01/31/2015   Ataxia 01/31/2015   PCP:  Alysia Penna, MD Pharmacy:   [facility] - [address] [place] - [address] [place]  [address] Phone: [phone] Fax: [phone]  [id number] - [place], [place] - [address] AT [facility] [address] [facility] [address] [place] [address] Phone: [phone] Fax: [phone]     Social Determinants of Health (SDOH) Social History: SDOH Screenings   Food Insecurity: No Food Insecurity (03/31/2023)  Housing: Low Risk  (03/31/2023)  Transportation Needs: No Transportation Needs (03/31/2023)  Utilities: Not At Risk (03/31/2023)  Tobacco Use: Low Risk  (03/30/2023)   SDOH Interventions: Food Insecurity Interventions: Intervention Not Indicated, Inpatient TOC Housing Interventions: Intervention Not Indicated, Inpatient TOC Transportation Interventions: Intervention Not Indicated, Inpatient TOC Utilities Interventions: Intervention Not Indicated, Inpatient TOC   Readmission Risk Interventions     No data to display

## 2023-03-31 NOTE — Plan of Care (Signed)

## 2023-03-31 NOTE — Care Management CC44 (Signed)
Condition Code 44 Documentation Completed  Patient Details  Name: CORIE SCHAECHER MRN: [id number] Date of Birth: 03-Mar-1946   Condition Code 44 given:  Yes Patient signature on Condition Code 44 notice:  Yes Documentation of 2 MD's agreement:  Yes Code 44 added to claim:  Yes    Adrian Prows, RN 03/31/2023, 2:00 PM

## 2023-03-31 NOTE — Care Management Obs Status (Signed)
MEDICARE OBSERVATION STATUS NOTIFICATION   Patient Details  Name: Lynn Joseph MRN: [id number] Date of Birth: 1946-01-30   Medicare Observation Status Notification Given:  Yes    Adrian Prows, RN 03/31/2023, 2:00 PM

## 2023-04-01 ENCOUNTER — Telehealth (HOSPITAL_BASED_OUTPATIENT_CLINIC_OR_DEPARTMENT_OTHER): Payer: Self-pay | Admitting: Pulmonary Disease

## 2023-04-01 DIAGNOSIS — G4734 Idiopathic sleep related nonobstructive alveolar hypoventilation: Secondary | ICD-10-CM

## 2023-04-01 LAB — TYPE AND SCREEN
ABO/RH(D): A POS
Antibody Screen: NEGATIVE
Unit division: 0
Unit division: 0

## 2023-04-01 LAB — BPAM RBC
Blood Product Expiration Date: 202412272359
Blood Product Expiration Date: 202412312359
ISSUE DATE / TIME: 202412080321
ISSUE DATE / TIME: 202412071952
Unit Type and Rh: 6200
Unit Type and Rh: 6200

## 2023-04-02 ENCOUNTER — Ambulatory Visit (HOSPITAL_BASED_OUTPATIENT_CLINIC_OR_DEPARTMENT_OTHER): Payer: Medicare Other | Attending: Pulmonary Disease | Admitting: Radiology

## 2023-04-02 DIAGNOSIS — R0602 Shortness of breath: Secondary | ICD-10-CM

## 2023-04-02 NOTE — Telephone Encounter (Signed)
Spoke with the daughter. She states that her mother qualified for oxygen at the same time as CPAP. She declined the oxygen then but would like it to be added into CPAP. She is also asking for a local company to [place] as they are not happy with [employer]. They are aware Dr Everardo All is in office tomorrow. Please call the patient after we place order. She was finally fitted for mask today.

## 2023-04-02 NOTE — Telephone Encounter (Signed)
PT's daughter calling again. They are on their way to be fitted for the mask. Can Dr. Everardo All advise please?   Also, she is still having difficulty breathing can they have some 02 for the daytime  Her # is [phone]

## 2023-04-03 LAB — LEGIONELLA PNEUMOPHILA SEROGP 1 UR AG: L. pneumophila Serogp 1 Ur Ag: NEGATIVE

## 2023-04-03 NOTE — Telephone Encounter (Signed)
Patient required nocturnal oxygen supplementation of 2L while inpatient in December 2024. Request to continue this.  Will order ONO on CPAP now that she has a better fitting mask.

## 2023-04-04 LAB — CULTURE, BLOOD (ROUTINE X 2)
Culture: NO GROWTH
Culture: NO GROWTH
Special Requests: ADEQUATE

## 2023-04-09 ENCOUNTER — Other Ambulatory Visit: Payer: Self-pay

## 2023-04-09 ENCOUNTER — Emergency Department (HOSPITAL_COMMUNITY): Payer: Medicare Other

## 2023-04-09 ENCOUNTER — Ambulatory Visit: Payer: Medicare Other | Admitting: Cardiovascular Disease

## 2023-04-09 ENCOUNTER — Inpatient Hospital Stay (HOSPITAL_COMMUNITY)
Admission: EM | Admit: 2023-04-09 | Discharge: 2023-04-14 | DRG: 811 | Disposition: A | Discharge status: Home / Self Care | Payer: Medicare Other | Attending: Family Medicine | Admitting: Family Medicine

## 2023-04-09 ENCOUNTER — Encounter (HOSPITAL_COMMUNITY): Payer: Self-pay

## 2023-04-09 DIAGNOSIS — I480 Paroxysmal atrial fibrillation: Secondary | ICD-10-CM | POA: Diagnosis not present

## 2023-04-09 DIAGNOSIS — R7982 Elevated C-reactive protein (CRP): Secondary | ICD-10-CM | POA: Diagnosis present

## 2023-04-09 DIAGNOSIS — R9431 Abnormal electrocardiogram [ECG] [EKG]: Secondary | ICD-10-CM | POA: Diagnosis not present

## 2023-04-09 DIAGNOSIS — D649 Anemia, unspecified: Secondary | ICD-10-CM | POA: Diagnosis not present

## 2023-04-09 DIAGNOSIS — R161 Splenomegaly, not elsewhere classified: Secondary | ICD-10-CM | POA: Diagnosis not present

## 2023-04-09 DIAGNOSIS — D62 Acute posthemorrhagic anemia: Secondary | ICD-10-CM | POA: Diagnosis present

## 2023-04-09 DIAGNOSIS — N179 Acute kidney failure, unspecified: Secondary | ICD-10-CM | POA: Diagnosis not present

## 2023-04-09 DIAGNOSIS — Z8249 Family history of ischemic heart disease and other diseases of the circulatory system: Secondary | ICD-10-CM

## 2023-04-09 DIAGNOSIS — I130 Hypertensive heart and chronic kidney disease with heart failure and stage 1 through stage 4 chronic kidney disease, or unspecified chronic kidney disease: Secondary | ICD-10-CM | POA: Diagnosis not present

## 2023-04-09 DIAGNOSIS — E538 Deficiency of other specified B group vitamins: Secondary | ICD-10-CM | POA: Diagnosis not present

## 2023-04-09 DIAGNOSIS — E669 Obesity, unspecified: Secondary | ICD-10-CM | POA: Diagnosis present

## 2023-04-09 DIAGNOSIS — H409 Unspecified glaucoma: Secondary | ICD-10-CM | POA: Diagnosis present

## 2023-04-09 DIAGNOSIS — N1832 Chronic kidney disease, stage 3b: Secondary | ICD-10-CM | POA: Diagnosis present

## 2023-04-09 DIAGNOSIS — I5030 Unspecified diastolic (congestive) heart failure: Secondary | ICD-10-CM | POA: Diagnosis not present

## 2023-04-09 DIAGNOSIS — N250 Renal osteodystrophy: Secondary | ICD-10-CM | POA: Diagnosis not present

## 2023-04-09 DIAGNOSIS — Z841 Family history of disorders of kidney and ureter: Secondary | ICD-10-CM

## 2023-04-09 DIAGNOSIS — K922 Gastrointestinal hemorrhage, unspecified: Secondary | ICD-10-CM | POA: Diagnosis present

## 2023-04-09 DIAGNOSIS — M25572 Pain in left ankle and joints of left foot: Secondary | ICD-10-CM | POA: Diagnosis present

## 2023-04-09 DIAGNOSIS — Z6839 Body mass index (BMI) 39.0-39.9, adult: Secondary | ICD-10-CM

## 2023-04-09 DIAGNOSIS — M170 Bilateral primary osteoarthritis of knee: Secondary | ICD-10-CM | POA: Diagnosis not present

## 2023-04-09 DIAGNOSIS — Z8349 Family history of other endocrine, nutritional and metabolic diseases: Secondary | ICD-10-CM

## 2023-04-09 DIAGNOSIS — I129 Hypertensive chronic kidney disease with stage 1 through stage 4 chronic kidney disease, or unspecified chronic kidney disease: Secondary | ICD-10-CM | POA: Diagnosis present

## 2023-04-09 DIAGNOSIS — M25541 Pain in joints of right hand: Secondary | ICD-10-CM | POA: Diagnosis not present

## 2023-04-09 DIAGNOSIS — R195 Other fecal abnormalities: Secondary | ICD-10-CM | POA: Diagnosis not present

## 2023-04-09 DIAGNOSIS — Z79890 Hormone replacement therapy (postmenopausal): Secondary | ICD-10-CM

## 2023-04-09 DIAGNOSIS — T45515A Adverse effect of anticoagulants, initial encounter: Secondary | ICD-10-CM | POA: Diagnosis present

## 2023-04-09 DIAGNOSIS — R700 Elevated erythrocyte sedimentation rate: Secondary | ICD-10-CM | POA: Diagnosis present

## 2023-04-09 DIAGNOSIS — R202 Paresthesia of skin: Secondary | ICD-10-CM | POA: Diagnosis present

## 2023-04-09 DIAGNOSIS — J90 Pleural effusion, not elsewhere classified: Secondary | ICD-10-CM | POA: Diagnosis not present

## 2023-04-09 DIAGNOSIS — Z9851 Tubal ligation status: Secondary | ICD-10-CM

## 2023-04-09 DIAGNOSIS — Z9049 Acquired absence of other specified parts of digestive tract: Secondary | ICD-10-CM

## 2023-04-09 DIAGNOSIS — Z9884 Bariatric surgery status: Secondary | ICD-10-CM

## 2023-04-09 DIAGNOSIS — M25542 Pain in joints of left hand: Secondary | ICD-10-CM | POA: Diagnosis present

## 2023-04-09 DIAGNOSIS — E539 Vitamin B deficiency, unspecified: Secondary | ICD-10-CM | POA: Diagnosis present

## 2023-04-09 DIAGNOSIS — Z833 Family history of diabetes mellitus: Secondary | ICD-10-CM

## 2023-04-09 DIAGNOSIS — R200 Anesthesia of skin: Secondary | ICD-10-CM | POA: Diagnosis present

## 2023-04-09 DIAGNOSIS — E785 Hyperlipidemia, unspecified: Secondary | ICD-10-CM | POA: Diagnosis present

## 2023-04-09 DIAGNOSIS — R0989 Other specified symptoms and signs involving the circulatory and respiratory systems: Secondary | ICD-10-CM | POA: Diagnosis not present

## 2023-04-09 DIAGNOSIS — G4733 Obstructive sleep apnea (adult) (pediatric): Secondary | ICD-10-CM | POA: Diagnosis not present

## 2023-04-09 DIAGNOSIS — D638 Anemia in other chronic diseases classified elsewhere: Secondary | ICD-10-CM | POA: Diagnosis present

## 2023-04-09 DIAGNOSIS — R262 Difficulty in walking, not elsewhere classified: Secondary | ICD-10-CM | POA: Diagnosis present

## 2023-04-09 DIAGNOSIS — M109 Gout, unspecified: Secondary | ICD-10-CM | POA: Diagnosis not present

## 2023-04-09 DIAGNOSIS — Z8719 Personal history of other diseases of the digestive system: Secondary | ICD-10-CM

## 2023-04-09 DIAGNOSIS — N170 Acute kidney failure with tubular necrosis: Secondary | ICD-10-CM | POA: Diagnosis present

## 2023-04-09 DIAGNOSIS — K429 Umbilical hernia without obstruction or gangrene: Secondary | ICD-10-CM | POA: Diagnosis not present

## 2023-04-09 DIAGNOSIS — Z9889 Other specified postprocedural states: Secondary | ICD-10-CM

## 2023-04-09 DIAGNOSIS — D500 Iron deficiency anemia secondary to blood loss (chronic): Secondary | ICD-10-CM | POA: Diagnosis not present

## 2023-04-09 DIAGNOSIS — Z7901 Long term (current) use of anticoagulants: Secondary | ICD-10-CM

## 2023-04-09 DIAGNOSIS — R059 Cough, unspecified: Secondary | ICD-10-CM | POA: Diagnosis not present

## 2023-04-09 DIAGNOSIS — R0600 Dyspnea, unspecified: Secondary | ICD-10-CM | POA: Diagnosis not present

## 2023-04-09 DIAGNOSIS — D509 Iron deficiency anemia, unspecified: Secondary | ICD-10-CM | POA: Diagnosis not present

## 2023-04-09 DIAGNOSIS — D6832 Hemorrhagic disorder due to extrinsic circulating anticoagulants: Secondary | ICD-10-CM | POA: Diagnosis not present

## 2023-04-09 DIAGNOSIS — N184 Chronic kidney disease, stage 4 (severe): Secondary | ICD-10-CM | POA: Diagnosis not present

## 2023-04-09 DIAGNOSIS — I517 Cardiomegaly: Secondary | ICD-10-CM | POA: Diagnosis not present

## 2023-04-09 DIAGNOSIS — R918 Other nonspecific abnormal finding of lung field: Secondary | ICD-10-CM | POA: Diagnosis not present

## 2023-04-09 DIAGNOSIS — Z888 Allergy status to other drugs, medicaments and biological substances status: Secondary | ICD-10-CM

## 2023-04-09 DIAGNOSIS — K921 Melena: Secondary | ICD-10-CM | POA: Diagnosis not present

## 2023-04-09 DIAGNOSIS — M25571 Pain in right ankle and joints of right foot: Secondary | ICD-10-CM | POA: Diagnosis present

## 2023-04-09 DIAGNOSIS — Z85828 Personal history of other malignant neoplasm of skin: Secondary | ICD-10-CM

## 2023-04-09 DIAGNOSIS — R188 Other ascites: Secondary | ICD-10-CM | POA: Diagnosis not present

## 2023-04-09 DIAGNOSIS — Z823 Family history of stroke: Secondary | ICD-10-CM

## 2023-04-09 DIAGNOSIS — E039 Hypothyroidism, unspecified: Secondary | ICD-10-CM | POA: Diagnosis present

## 2023-04-09 DIAGNOSIS — Z79899 Other long term (current) drug therapy: Secondary | ICD-10-CM

## 2023-04-09 DIAGNOSIS — Z8701 Personal history of pneumonia (recurrent): Secondary | ICD-10-CM

## 2023-04-09 DIAGNOSIS — Z880 Allergy status to penicillin: Secondary | ICD-10-CM

## 2023-04-09 DIAGNOSIS — R5383 Other fatigue: Secondary | ICD-10-CM | POA: Diagnosis not present

## 2023-04-09 LAB — URINALYSIS, ROUTINE W REFLEX MICROSCOPIC
Bacteria, UA: NONE SEEN
Bilirubin Urine: NEGATIVE
Glucose, UA: NEGATIVE mg/dL
Ketones, ur: NEGATIVE mg/dL
Leukocytes,Ua: NEGATIVE
Nitrite: NEGATIVE
Protein, ur: 30 mg/dL — AB
RBC / HPF: >50 RBC/hpf (ref 0–5)
Specific Gravity, Urine: 1.009 (ref 1.005–1.030)
pH: 5 (ref 5.0–8.0)

## 2023-04-09 LAB — CBC
HCT: 23 % — ABNORMAL LOW (ref 36.0–46.0)
Hemoglobin: 7 g/dL — ABNORMAL LOW (ref 12.0–15.0)
MCH: 27.1 pg (ref 26.0–34.0)
MCHC: 30.4 g/dL (ref 30.0–36.0)
MCV: 89.1 fL (ref 80.0–100.0)
Platelets: 217 10*3/uL (ref 150–400)
RBC: 2.58 MIL/uL — ABNORMAL LOW (ref 3.87–5.11)
RDW: 15.3 % (ref 11.5–15.5)
WBC: 5 10*3/uL (ref 4.0–10.5)
nRBC: 0 % (ref 0.0–0.2)

## 2023-04-09 LAB — COMPREHENSIVE METABOLIC PANEL
ALT: 16 U/L (ref 0–44)
AST: 26 U/L (ref 15–41)
Albumin: 3.3 g/dL — ABNORMAL LOW (ref 3.5–5.0)
Alkaline Phosphatase: 81 U/L (ref 38–126)
Anion gap: 11 (ref 5–15)
BUN: 60 mg/dL — ABNORMAL HIGH (ref 8–23)
CO2: 21 mmol/L — ABNORMAL LOW (ref 22–32)
Calcium: 8.2 mg/dL — ABNORMAL LOW (ref 8.9–10.3)
Chloride: 106 mmol/L (ref 98–111)
Creatinine, Ser: 3.41 mg/dL — ABNORMAL HIGH (ref 0.44–1.00)
GFR, Estimated: 13 mL/min — ABNORMAL LOW (ref >60)
Glucose, Bld: 132 mg/dL — ABNORMAL HIGH (ref 70–99)
Potassium: 4 mmol/L (ref 3.5–5.1)
Sodium: 138 mmol/L (ref 135–145)
Total Bilirubin: 0.9 mg/dL (ref <1.2)
Total Protein: 7.1 g/dL (ref 6.5–8.1)

## 2023-04-09 LAB — PREPARE RBC (CROSSMATCH)

## 2023-04-09 LAB — IRON AND TIBC
Iron: 55 ug/dL (ref 28–170)
Saturation Ratios: 14 % (ref 10.4–31.8)
TIBC: 381 ug/dL (ref 250–450)
UIBC: 326 ug/dL

## 2023-04-09 LAB — TSH: TSH: 5.835 u[IU]/mL — ABNORMAL HIGH (ref 0.350–4.500)

## 2023-04-09 LAB — RESP PANEL BY RT-PCR (RSV, FLU A&B, COVID)  RVPGX2
Influenza A by PCR: NEGATIVE
Influenza B by PCR: NEGATIVE
Resp Syncytial Virus by PCR: NEGATIVE
SARS Coronavirus 2 by RT PCR: NEGATIVE

## 2023-04-09 LAB — SEDIMENTATION RATE: Sed Rate: 111 mm/h — ABNORMAL HIGH (ref 0–22)

## 2023-04-09 LAB — CK: Total CK: 61 U/L (ref 38–234)

## 2023-04-09 LAB — FERRITIN: Ferritin: 88 ng/mL (ref 11–307)

## 2023-04-09 MED ORDER — NEBIVOLOL HCL 10 MG PO TABS
20.0000 mg | ORAL_TABLET | Freq: Two times a day (BID) | ORAL | Status: DC
  Administered 2023-04-09 – 2023-04-10 (×3): 20 mg via ORAL
  Filled 2023-04-09 (×10): qty 2

## 2023-04-09 MED ORDER — DIPHENHYDRAMINE HCL 25 MG PO CAPS
25.0000 mg | ORAL_CAPSULE | Freq: Once | ORAL | Status: AC
  Administered 2023-04-09: 25 mg via ORAL
  Filled 2023-04-09: qty 1

## 2023-04-09 MED ORDER — SODIUM CHLORIDE 0.9% IV SOLUTION: Freq: Once | INTRAVENOUS | Status: AC

## 2023-04-09 MED ORDER — SODIUM CHLORIDE 0.9 % IV BOLUS
1000.0000 mL | Freq: Once | INTRAVENOUS | Status: AC
  Administered 2023-04-09: 1000 mL via INTRAVENOUS

## 2023-04-09 MED ORDER — LEVOTHYROXINE SODIUM 100 MCG PO TABS
100.0000 ug | ORAL_TABLET | Freq: Every day | ORAL | Status: DC
  Administered 2023-04-10 – 2023-04-14 (×5): 100 ug via ORAL
  Filled 2023-04-09 (×5): qty 1

## 2023-04-09 MED ORDER — GABAPENTIN 100 MG PO CAPS
100.0000 mg | ORAL_CAPSULE | Freq: Every day | ORAL | Status: DC
  Administered 2023-04-09 – 2023-04-13 (×5): 100 mg via ORAL
  Filled 2023-04-09 (×5): qty 1

## 2023-04-09 NOTE — H&P (Signed)
History and Physical    Patient: Lynn Joseph MWU:[id number] DOB: Feb 21, 1946 DOA: 04/09/2023 DOS: the patient was seen and examined on 04/09/2023 PCP: Alysia Penna, MD  Patient coming from: Home  Chief Complaint:  Chief Complaint  Patient presents with   Abnormal Lab   HPI: LATONA PARNES is a 77 y.o. female with medical history significant for atrial fibrillation on Eliquis, chronic anemia, hypertension, obesity obesity, obstructive sleep apnea on CPAP nightly, and hypothyroidism who presents with severe fatigue, no energy, and just not feeling well.  This is how she felt when she was admitted to the hospital 10 days ago.  During that hospital stay she received 2 units of packed red blood cells but reports that she really did not feel great even at the time of discharge.  She has been making it and had a day or 2 since discharge where she felt okay but again today she just had no energy so her daughter brought her in for evaluation.  She is not felt at baseline and at least a month.  Does get B12 injections. She denies any bright red blood per rectum or vomiting blood or hematuria or seeing any blood loss at all. She has been having trouble with numbness and tingling in her hands and feet which is very uncomfortable.  She does have some pain in her hands particularly at night.  A month ago she was completely independent driving ambulating etc.  Now she is having difficulty walking and using a cane. No fevers or chills.  The patient will be admitted to the hospitalist service for transfusion and further workup.  Review of Systems: As mentioned in the history of present illness. All other systems reviewed and are negative. Past Medical History:  Diagnosis Date   Anemia of chronic disease    Chronic diarrhea    Glaucoma    HTN (hypertension)    Hypertension    Hypothyroid    Obesity    Skin cancer    Past Surgical History:  Procedure Laterality Date   CHOLECYSTECTOMY N/A  03/11/2022   Procedure: LAPAROSCOPIC CHOLECYSTECTOMY;  Surgeon: Fritzi Mandes, MD;  Location: [facility] OR;  Service: General;  Laterality: N/A;   GASTRIC BYPASS     GASTRIC RESTRICTION SURGERY     IR REMOVAL BILIARY DRAIN  04/06/2022   IR SINUS/FIST TUBE CHK-NON GI  03/09/2022   RENAL ANGIOGRAPHY N/A 06/20/2022   Procedure: RENAL ANGIOGRAPHY;  Surgeon: Iran Ouch, MD;  Location: [facility] INVASIVE CV LAB;  Service: Cardiovascular;  Laterality: N/A;   TUBAL LIGATION     Social History:  reports that she has never smoked. She has never used smokeless tobacco. She reports that she does not currently use alcohol. She reports that she does not use drugs.  Allergies  Allergen Reactions   Amlodipine Swelling and Other (See Comments)    Lowered B/P too much, made lethargic, also     Carvedilol Other (See Comments)    Dropped blood pressure too low   Penicillin G Sodium Other (See Comments)    GI upset    Family History  Problem Relation Age of Onset   Hypertension Mother    Thyroid disease Mother    Stroke Mother    Kidney disease Father    Diabetes Father     Prior to Admission medications   Medication Sig Start Date End Date Taking? Authorizing Provider  allopurinol (ZYLOPRIM) 100 MG tablet Take 100 mg by mouth daily.  [provider]  apixaban (ELIQUIS) 5 MG TABS tablet Take 1 tablet (5 mg total) by mouth 2 (two) times daily. 04/01/23   Hughie Closs, MD  Calcium-Magnesium-Zinc (CALCIUM-MAGNESUIUM-ZINC PO) Take 1 tablet by mouth 2 (two) times a week.    [provider]  febuxostat (ULORIC) 40 MG tablet Take 40 mg by mouth daily.    [provider]  ferrous sulfate 325 (65 FE) MG tablet Take 1 tablet (325 mg total) by mouth daily with breakfast. 03/31/23 04/30/23  Hughie Closs, MD  furosemide (LASIX) 20 MG tablet Take 20 mg by mouth daily as needed (for a weight gain of 4-5 pounds in the period of a couple of days).    [provider]  gabapentin  (NEURONTIN) 100 MG capsule Take 100 mg by mouth at bedtime.    [provider]  hydrALAZINE (APRESOLINE) 100 MG tablet Take 1 tablet (100 mg total) by mouth 3 (three) times daily. 05/30/22   Antonieta Iba, MD  Levothyroxine Sodium (TIROSINT) 100 MCG CAPS Take 1 capsule (100 mcg total) by mouth daily before breakfast. Patient taking differently: Take 1 capsule by mouth See admin instructions. Take 100 mcg by mouth every day at 2:30 PM 03/19/22   Luciano Cutter, MD  Nebivolol HCl (BYSTOLIC) 20 MG TABS Take 1 tablet (20 mg total) by mouth 2 (two) times daily. 10/23/22   Antonieta Iba, MD  rosuvastatin (CRESTOR) 5 MG tablet Take 1 tablet (5 mg total) by mouth daily. 05/30/22   Antonieta Iba, MD  TYLENOL 500 MG tablet Take 1,000 mg by mouth every 6 (six) hours as needed for mild pain (pain score 1-3), headache or fever.    [provider]  vitamin B-12 (CYANOCOBALAMIN) 50 MCG tablet Take 50 mcg by mouth 2 (two) times a week.    [provider]    Physical Exam: Vitals:   04/09/23 1652 04/09/23 1653 04/09/23 1935  BP: (!) 143/67  (!) 138/48  Pulse: 63  73  Resp: 18  (!) 24  Temp: 98.2 F (36.8 C)    TempSrc: Oral    SpO2: 100%  93%  Weight:  98.9 kg   Height:  5\' 5"  (1.651 m)    Physical Exam:  General: No acute distress, well developed, well nourished, sallow HEENT: Normocephalic, atraumatic, PERRL Cardiovascular: Normal rate and rhythm. Distal pulses intact. Pulmonary: Normal pulmonary effort, normal breath sounds Gastrointestinal: Nondistended abdomen, soft, non-tender, normoactive bowel sounds Musculoskeletal:Normal ROM, no lower ext edema Lymphadenopathy: No cervical LAD. Skin: Skin is warm and dry.  Neuro: No focal deficits noted, AAOx3. PSYCH: Attentive and cooperative  Data Reviewed:  Results for orders placed or performed during the hospital encounter of 04/09/23 (from the past 24 hours)  Comprehensive metabolic panel     Status: Abnormal    Collection Time: 04/09/23  5:00 PM  Result Value Ref Range   Sodium 138 135 - 145 mmol/L   Potassium 4.0 3.5 - 5.1 mmol/L   Chloride 106 98 - 111 mmol/L   CO2 21 (L) 22 - 32 mmol/L   Glucose, Bld 132 (H) 70 - 99 mg/dL   BUN 60 (H) 8 - 23 mg/dL   Creatinine, Ser 8.29 (H) 0.44 - 1.00 mg/dL   Calcium 8.2 (L) 8.9 - 10.3 mg/dL   Total Protein 7.1 6.5 - 8.1 g/dL   Albumin 3.3 (L) 3.5 - 5.0 g/dL   AST 26 15 - 41 U/L   ALT 16 0 - 44  U/L   Alkaline Phosphatase 81 38 - 126 U/L   Total Bilirubin 0.9 <1.2 mg/dL   GFR, Estimated 13 (L) >60 mL/min   Anion gap 11 5 - 15  CBC     Status: Abnormal   Collection Time: 04/09/23  5:00 PM  Result Value Ref Range   WBC 5.0 4.0 - 10.5 K/uL   RBC 2.58 (L) 3.87 - 5.11 MIL/uL   Hemoglobin 7.0 (L) 12.0 - 15.0 g/dL   HCT 57.8 (L) 46.9 - 62.9 %   MCV 89.1 80.0 - 100.0 fL   MCH 27.1 26.0 - 34.0 pg   MCHC 30.4 30.0 - 36.0 g/dL   RDW 52.8 41.3 - 24.4 %   Platelets 217 150 - 400 K/uL   nRBC 0.0 0.0 - 0.2 %  Type and screen [facility]     Status: None   Collection Time: 04/09/23  5:00 PM  Result Value Ref Range   ABO/RH(D) A POS    Antibody Screen NEG    Sample Expiration      04/12/2023,2359 Performed at [facility], [address] [place], [place] [address]   Resp panel by RT-PCR (RSV, Flu A&B, Covid) Anterior Nasal Swab     Status: None   Collection Time: 04/09/23  6:31 PM   Specimen: Anterior Nasal Swab  Result Value Ref Range   SARS Coronavirus 2 by RT PCR NEGATIVE NEGATIVE   Influenza A by PCR NEGATIVE NEGATIVE   Influenza B by PCR NEGATIVE NEGATIVE   Resp Syncytial Virus by PCR NEGATIVE NEGATIVE     Assessment and Plan: Recurrent symptomatic anemia - GI consult  The patient tells me she has had multiple endoscopies in the past but it does not look like she is actually had one since she has been on Eliquis and GI bleed is still a possibility even though she was guaiac negative last week.  She has  never had capsular endoscopy.  The family was also looking to have a hematology consult for an anemia workup which I think is a good idea as the patient has had anemia for many many years.  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  She has a lot of side effects from taking oral iron supplementation and would probably benefit from IV iron.  She struggles with the multiple vitamin supplementations that she is supposed to be taking. - Will hold eliquis - NPO after midnight -Check TIBC and ferritin   2.  Acute on chronic renal failure -with IV fluids and transfusion hopefully her creatinine will return to her 1.9 baseline.  3.  Obstructive sleep apnea on CPAP -resume. She was prescribed oxygen at night with her CPAP but refused in the past.  Will go ahead and resume oxygen at night while she is in the hospital.  4.  Joint pains, fatigue, numbness and tingling in hands and feet - the patient may benefit from a full autoimmune workup.  Incidentally she mentioned that she actually felt the best when she had a short course of steroids. - Will check some inflammatory markers and TSH   Advance Care Planning:   Code Status: Prior the patient wants to be full code and names her daughter as her surrogate decision-maker.  Consults: none  Family Communication: Daughter at bedside  Severity of Illness: The appropriate patient status for this patient is INPATIENT. Inpatient status is judged to be reasonable and necessary in order to provide the required  intensity of service to ensure the patient's safety. The patient's presenting symptoms, physical exam findings, and initial radiographic and laboratory data in the context of their chronic comorbidities is felt to place them at high risk for further clinical deterioration. Furthermore, it is not anticipated that the patient will be medically stable for discharge from the hospital within 2 midnights of admission.   * I certify that at  the point of admission it is my clinical judgment that the patient will require inpatient hospital care spanning beyond 2 midnights from the point of admission due to high intensity of service, high risk for further deterioration and high frequency of surveillance required.*  Author: Buena Irish, MD 04/09/2023 8:15 PM  For on call review [email or web address].

## 2023-04-09 NOTE — Progress Notes (Signed)
   04/09/23 2253  BiPAP/CPAP/SIPAP  BiPAP/CPAP/SIPAP Pt Type Adult  BiPAP/CPAP/SIPAP Resmed  Mask Type Full face mask  Mask Size Medium  Flow Rate 2 lpm  Patient Home Equipment No  Auto Titrate Yes (5-15 (per pt))

## 2023-04-09 NOTE — ED Provider Notes (Signed)
[place] EMERGENCY DEPARTMENT AT [facility] Provider Note   CSN: [id number] Arrival date & time: 04/09/23  1643     History  Chief Complaint  Patient presents with   Abnormal Lab    Lynn Joseph is a 77 y.o. female with history of chronic anemia, Roux-en-Y, A-fib on Eliquis, CKD  presents with complaints of weakness.  Referred from primary care provider with concern of AKI and low hemoglobin.  Patient was recently admitted on 12/7 for anemia and suspected pneumonia.  She required 2 blood transfusions and IV antibiotics.  Discharged on course of p.o. antibiotics.  Cultures were negative.  Denies any melena or hematochezia.  Patient states she has had negative colonoscopies and endoscopies in the past.  Reports that she had a mild cough earlier today.  Otherwise denies any chest pain or shortness of breath no abdominal pain.  Reports that she has not been urinating as much recently.   Abnormal Lab     Past Medical History:  Diagnosis Date   Anemia of chronic disease    Chronic diarrhea    Glaucoma    HTN (hypertension)    Hypertension    Hypothyroid    Obesity    Skin cancer   \  Home Medications Prior to Admission medications   Medication Sig Start Date End Date Taking? Authorizing Provider  apixaban (ELIQUIS) 5 MG TABS tablet Take 1 tablet (5 mg total) by mouth 2 (two) times daily. 04/01/23   Hughie Closs, MD  Calcium-Magnesium-Zinc (CALCIUM-MAGNESUIUM-ZINC PO) Take 1 tablet by mouth 2 (two) times a week.    [provider]  febuxostat (ULORIC) 40 MG tablet Take 40 mg by mouth daily.    [provider]  ferrous sulfate 325 (65 FE) MG tablet Take 1 tablet (325 mg total) by mouth daily with breakfast. 03/31/23 04/30/23  Hughie Closs, MD  furosemide (LASIX) 20 MG tablet Take 20 mg by mouth daily as needed (for a weight gain of 4-5 pounds in the period of a couple of days).    [provider]  gabapentin (NEURONTIN) 100 MG capsule  Take 100 mg by mouth at bedtime.    [provider]  hydrALAZINE (APRESOLINE) 100 MG tablet Take 1 tablet (100 mg total) by mouth 3 (three) times daily. 05/30/22   Antonieta Iba, MD  Levothyroxine Sodium (TIROSINT) 100 MCG CAPS Take 1 capsule (100 mcg total) by mouth daily before breakfast. Patient taking differently: Take 1 capsule by mouth See admin instructions. Take 100 mcg by mouth every day at 2:30 PM 03/19/22   Luciano Cutter, MD  Nebivolol HCl (BYSTOLIC) 20 MG TABS Take 1 tablet (20 mg total) by mouth 2 (two) times daily. 10/23/22   Antonieta Iba, MD  rosuvastatin (CRESTOR) 5 MG tablet Take 1 tablet (5 mg total) by mouth daily. 05/30/22   Antonieta Iba, MD  TYLENOL 500 MG tablet Take 1,000 mg by mouth every 6 (six) hours as needed for mild pain (pain score 1-3), headache or fever.    [provider]      Allergies    Amlodipine, Carvedilol, and Penicillin g sodium    Review of Systems   Review of Systems  Neurological:  Positive for weakness.    Physical Exam Updated Vital Signs BP (!) 153/60   Pulse 70   Temp 98.2 F (36.8 C)   Resp (!) 23   Ht 5\' 5"  (1.651 m)   Wt 98.9 kg  SpO2 95%   BMI 36.28 kg/m  Physical Exam Vitals and nursing note reviewed.  Constitutional:      General: She is not in acute distress.    Appearance: She is well-developed.  HENT:     Head: Normocephalic and atraumatic.  Eyes:     Comments: Pale conjunctivae  Cardiovascular:     Rate and Rhythm: Normal rate and regular rhythm.     Heart sounds: No murmur heard. Pulmonary:     Effort: Pulmonary effort is normal. No respiratory distress.     Breath sounds: Normal breath sounds.  Abdominal:     Palpations: Abdomen is soft.     Tenderness: There is no abdominal tenderness.  Musculoskeletal:        General: No swelling.     Cervical back: Neck supple.  Skin:    General: Skin is warm and dry.     Capillary Refill: Capillary refill takes less than 2 seconds.   Neurological:     Mental Status: She is alert.  Psychiatric:        Mood and Affect: Mood normal.     ED Results / Procedures / Treatments   Labs (all labs ordered are listed, but only abnormal results are displayed) Labs Reviewed  COMPREHENSIVE METABOLIC PANEL - Abnormal; Notable for the following components:      Result Value   CO2 21 (*)    Glucose, Bld 132 (*)    BUN 60 (*)    Creatinine, Ser 3.41 (*)    Calcium 8.2 (*)    Albumin 3.3 (*)    GFR, Estimated 13 (*)    All other components within normal limits  CBC - Abnormal; Notable for the following components:   RBC 2.58 (*)    Hemoglobin 7.0 (*)    HCT 23.0 (*)    All other components within normal limits  RESP PANEL BY RT-PCR (RSV, FLU A&B, COVID)  [id number]  URINALYSIS, ROUTINE W REFLEX MICROSCOPIC  OCCULT BLOOD X 1 CARD TO LAB, STOOL  TYPE AND SCREEN    EKG None  Radiology DG Chest 2 View Result Date: 04/09/2023 CLINICAL DATA:  Cough.  Fatigue. EXAM: CHEST - 2 VIEW COMPARISON:  Radiograph 03/30/2023, CT 03/08/2022 FINDINGS: Mild cardiomegaly. Small left pleural effusion, similar to prior with otherwise improved left basilar aeration. Peribronchial thickening as well as subtle ill-defined pulmonary opacity primarily in the right mid and upper lung zones. No pneumothorax. IMPRESSION: 1. Peribronchial thickening as well as subtle ill-defined pulmonary opacity primarily in the right mid and upper lung zones. This may be infectious or asymmetric edema. 2. Small left pleural effusion. Left basilar aeration has otherwise improved since earlier this month. 3. Mild cardiomegaly. Electronically Signed   By: Narda Rutherford M.D.   On: 04/09/2023 19:56    Procedures Procedures    Medications Ordered in ED Medications  sodium chloride 0.9 % bolus 1,000 mL (1,000 mLs Intravenous New Bag/Given 04/09/23 1842)    ED Course/ Medical Decision Making/ A&P Clinical Course as of 04/09/23 2034  Tue Apr 09, 2023  1819 AKI on  CKD.  [CC]    Clinical Course User Index [CC] Glyn Ade, MD                                 Medical Decision Making Amount and/or Complexity of Data Reviewed Labs: ordered.   This patient presents to the ED with chief complaint(s) of  weakness.  The complaint involves an extensive differential diagnosis and also carries with it a high risk of complications and morbidity.   pertinent past medical history as listed in HPI  The differential diagnosis includes  Anemia, AKI, pneumonia, URI The initial plan is to  Will obtain basic labs, repeat x-ray Additional history obtained: Additional history obtained from family Records reviewed previous admission documents  Initial Assessment:   Patient is hemodynamically stable, afebrile, nontoxic toxic appearing presenting with generalized weakness has worsened over the past few days.  With recent admission for pneumonia and anemia requiring IV antibiotics and to blood transfusions respectively.  She is without significant respiratory symptoms.   Independent ECG interpretation:  sinus rhythm, occasional PAC  Independent labs interpretation:  The following labs were independently interpreted:  CBC notable for hemoglobin of 7, appears to have been 7.99 days ago.  CMP notable for creatinine of 3.41, baseline of approximately 2, respiratory panel negative, UA pending  Independent visualization and interpretation of imaging: I independently visualized the following imaging with scope of interpretation limited to determining acute life threatening conditions related to emergency care: Chest x-ray, which revealed small left pleural effusion  Treatment and Reassessment: Following first assessment patient given liter bolus of normal saline  Consultations obtained:   Hospitalist: Received return call approximately 7:30 PM from Dr. Sharyn Blitz, agreed for admission for AKI and anemia.  Discussed admission with patient and her daughter.   Recommended Hemoccult test.  Patient denies as she reports it is always negative.  Disposition:   Patient will be admitted for AKI and anemia.  Discussed plan with patient and her daughter they are agreeable.  Social Determinants of Health:   none  This note was dictated with voice recognition software.  Despite best efforts at proofreading, errors may have occurred which can change the documentation meaning.          Final Clinical Impression(s) / ED Diagnoses Final diagnoses:  AKI (acute kidney injury) (HCC)  Anemia, unspecified type    Rx / DC Orders ED Discharge Orders     None         Fabienne Bruns 04/09/23 2034    Glyn Ade, MD 04/19/23 [phone]

## 2023-04-09 NOTE — ED Triage Notes (Signed)
Patient was told her hemoglobin was 7.2. Blood test performed today. Stated her kidney levels are elevated. Feels fatigued, more pale than normal, everything aches. Denies bleeding anywhere.

## 2023-04-09 NOTE — ED Notes (Signed)
ED TO INPATIENT HANDOFF REPORT  ED Nurse Name and Phone #: Alante Tolan/[phone]  S Name/Age/Gender Lynn Joseph 77 y.o. female Room/Bed: 1310/1310-01  Code Status   Code Status: Prior  Home/SNF/Other Home Patient oriented to: self, place, time, and situation Is this baseline? Yes   Triage Complete: Triage complete  Chief Complaint AKI (acute kidney injury) (HCC) [N17.9] Symptomatic anemia [D64.9] Anemia, unspecified type [D64.9]  Triage Note Patient was told her hemoglobin was 7.2. Blood test performed today. Stated her kidney levels are elevated. Feels fatigued, more pale than normal, everything aches. Denies bleeding anywhere.    Allergies Allergies  Allergen Reactions   Amlodipine Swelling and Other (See Comments)    Lowered B/P too much, made lethargic, also     Carvedilol Other (See Comments)    Dropped blood pressure too low   Penicillin G Sodium Other (See Comments)    GI upset    Level of Care/Admitting Diagnosis ED Disposition     ED Disposition  Admit   Condition  --   Comment  Hospital Area: [facility]  HOSPITAL [[id number]]  Level of Care: Telemetry [5]  Admit to tele based on following criteria: Other see comments  Comments: aki  May admit patient to [facility] or [facility] if equivalent level of care is available:: Yes  Covid Evaluation: Asymptomatic - no recent exposure (last 10 days) testing not required  Diagnosis: Symptomatic anemia [1610960]  Admitting Physician: Buena Irish [[id number]]  Attending Physician: Buena Irish [[id number]]  Certification:: I certify this patient will need inpatient services for at least 2 midnights  Expected Medical Readiness: 04/12/2023          B Medical/Surgery History Past Medical History:  Diagnosis Date   Anemia of chronic disease    Chronic diarrhea    Glaucoma    HTN (hypertension)    Hypertension    Hypothyroid    Obesity    Skin cancer    Past Surgical History:   Procedure Laterality Date   CHOLECYSTECTOMY N/A 03/11/2022   Procedure: LAPAROSCOPIC CHOLECYSTECTOMY;  Surgeon: Fritzi Mandes, MD;  Location: [facility] OR;  Service: General;  Laterality: N/A;   GASTRIC BYPASS     GASTRIC RESTRICTION SURGERY     IR REMOVAL BILIARY DRAIN  04/06/2022   IR SINUS/FIST TUBE CHK-NON GI  03/09/2022   RENAL ANGIOGRAPHY N/A 06/20/2022   Procedure: RENAL ANGIOGRAPHY;  Surgeon: Iran Ouch, MD;  Location: [facility] INVASIVE CV LAB;  Service: Cardiovascular;  Laterality: N/A;   TUBAL LIGATION       A IV Location/Drains/Wounds Patient Lines/Drains/Airways Status     Active Line/Drains/Airways     Name Placement date Placement time Site Days   Peripheral IV 04/09/23 20 G 1" Anterior;Left Forearm 04/09/23  1842  Forearm  less than 1            Intake/Output Last 24 hours  Intake/Output Summary (Last 24 hours) at 04/09/2023 2142 Last data filed at 04/09/2023 2130 Gross per 24 hour  Intake 1000 ml  Output --  Net 1000 ml    Labs/Imaging Results for orders placed or performed during the hospital encounter of 04/09/23 (from the past 48 hours)  Comprehensive metabolic panel     Status: Abnormal   Collection Time: 04/09/23  5:00 PM  Result Value Ref Range   Sodium 138 135 - 145 mmol/L   Potassium 4.0 3.5 - 5.1 mmol/L   Chloride 106 98 - 111 mmol/L   CO2 21 (L) 22 -  32 mmol/L   Glucose, Bld 132 (H) 70 - 99 mg/dL    Comment: Glucose reference range applies only to samples taken after fasting for at least 8 hours.   BUN 60 (H) 8 - 23 mg/dL   Creatinine, Ser 1.61 (H) 0.44 - 1.00 mg/dL   Calcium 8.2 (L) 8.9 - 10.3 mg/dL   Total Protein 7.1 6.5 - 8.1 g/dL   Albumin 3.3 (L) 3.5 - 5.0 g/dL   AST 26 15 - 41 U/L   ALT 16 0 - 44 U/L   Alkaline Phosphatase 81 38 - 126 U/L   Total Bilirubin 0.9 <1.2 mg/dL   GFR, Estimated 13 (L) >60 mL/min    Comment: (NOTE) Calculated using the CKD-EPI Creatinine Equation (2021)    Anion gap 11 5 - 15    Comment: Performed at  [facility], [address], [place], [place] [address]  CBC     Status: Abnormal   Collection Time: 04/09/23  5:00 PM  Result Value Ref Range   WBC 5.0 4.0 - 10.5 K/uL   RBC 2.58 (L) 3.87 - 5.11 MIL/uL   Hemoglobin 7.0 (L) 12.0 - 15.0 g/dL   HCT 54.0 (L) 98.1 - 19.1 %   MCV 89.1 80.0 - 100.0 fL   MCH 27.1 26.0 - 34.0 pg   MCHC 30.4 30.0 - 36.0 g/dL   RDW 47.8 29.5 - 62.1 %   Platelets 217 150 - 400 K/uL   nRBC 0.0 0.0 - 0.2 %    Comment: Performed at [facility], [address], [place], [place] [address]  Type and screen [facility]     Status: None   Collection Time: 04/09/23  5:00 PM  Result Value Ref Range   ABO/RH(D) A POS    Antibody Screen NEG    Sample Expiration      04/12/2023,2359 Performed at [facility], [address], [place], [place] [address]   Resp panel by RT-PCR (RSV, Flu A&B, Covid) Anterior Nasal Swab     Status: None   Collection Time: 04/09/23  6:31 PM   Specimen: Anterior Nasal Swab  Result Value Ref Range   SARS Coronavirus 2 by RT PCR NEGATIVE NEGATIVE    Comment: (NOTE) SARS-CoV-2 target nucleic acids are NOT DETECTED.  The SARS-CoV-2 RNA is generally detectable in upper respiratory specimens during the acute phase of infection. The lowest concentration of SARS-CoV-2 viral copies this assay can detect is 138 copies/mL. A negative result does not preclude SARS-Cov-2 infection and should not be used as the sole basis for treatment or other patient management decisions. A negative result may occur with  improper specimen collection/handling, submission of specimen other than nasopharyngeal swab, presence of viral mutation(s) within the areas targeted by this assay, and inadequate number of viral copies(<138 copies/mL). A negative result must be combined with clinical observations, patient history, and epidemiological information. The expected result is Negative.  Fact  Sheet for Patients:  BloggerCourse.com  Fact Sheet for Healthcare Providers:  SeriousBroker.it  This test is no t yet approved or cleared by the [place] FDA and  has been authorized for detection and/or diagnosis of SARS-CoV-2 by FDA under an Emergency Use Authorization (EUA). This EUA will remain  in effect (meaning this test can be used) for the duration of the COVID-19 declaration under Section 564(b)(1) of the Act, 21 U.S.C.section 360bbb-3(b)(1), unless the authorization is terminated  or revoked sooner.  Influenza A by PCR NEGATIVE NEGATIVE   Influenza B by PCR NEGATIVE NEGATIVE    Comment: (NOTE) The Xpert Xpress SARS-CoV-2/FLU/RSV plus assay is intended as an aid in the diagnosis of influenza from Nasopharyngeal swab specimens and should not be used as a sole basis for treatment. Nasal washings and aspirates are unacceptable for Xpert Xpress SARS-CoV-2/FLU/RSV testing.  Fact Sheet for Patients: BloggerCourse.com  Fact Sheet for Healthcare Providers: SeriousBroker.it  This test is not yet approved or cleared by the [place] FDA and has been authorized for detection and/or diagnosis of SARS-CoV-2 by FDA under an Emergency Use Authorization (EUA). This EUA will remain in effect (meaning this test can be used) for the duration of the COVID-19 declaration under Section 564(b)(1) of the Act, 21 U.S.C. section 360bbb-3(b)(1), unless the authorization is terminated or revoked.     Resp Syncytial Virus by PCR NEGATIVE NEGATIVE    Comment: (NOTE) Fact Sheet for Patients: BloggerCourse.com  Fact Sheet for Healthcare Providers: SeriousBroker.it  This test is not yet approved or cleared by the [place] FDA and has been authorized for detection and/or diagnosis of SARS-CoV-2 by FDA under an Emergency Use  Authorization (EUA). This EUA will remain in effect (meaning this test can be used) for the duration of the COVID-19 declaration under Section 564(b)(1) of the Act, 21 U.S.C. section 360bbb-3(b)(1), unless the authorization is terminated or revoked.  Performed at [facility], [address] [place], [place] [address]   Urinalysis, Routine w reflex microscopic -Urine, Clean Catch     Status: Abnormal   Collection Time: 04/09/23  8:28 PM  Result Value Ref Range   Color, Urine YELLOW YELLOW   APPearance HAZY (A) CLEAR   Specific Gravity, Urine 1.009 1.005 - 1.030   pH 5.0 5.0 - 8.0   Glucose, UA NEGATIVE NEGATIVE mg/dL   Hgb urine dipstick LARGE (A) NEGATIVE   Bilirubin Urine NEGATIVE NEGATIVE   Ketones, ur NEGATIVE NEGATIVE mg/dL   Protein, ur 30 (A) NEGATIVE mg/dL   Nitrite NEGATIVE NEGATIVE   Leukocytes,Ua NEGATIVE NEGATIVE   RBC / HPF >50 0 - 5 RBC/hpf   WBC, UA 11-20 0 - 5 WBC/hpf   Bacteria, UA NONE SEEN NONE SEEN   Squamous Epithelial / HPF 0-5 0 - 5 /HPF   Mucus PRESENT     Comment: Performed at [facility], [address] [place], [place] [address]   DG Chest 2 View Result Date: 04/09/2023 CLINICAL DATA:  Cough.  Fatigue. EXAM: CHEST - 2 VIEW COMPARISON:  Radiograph 03/30/2023, CT 03/08/2022 FINDINGS: Mild cardiomegaly. Small left pleural effusion, similar to prior with otherwise improved left basilar aeration. Peribronchial thickening as well as subtle ill-defined pulmonary opacity primarily in the right mid and upper lung zones. No pneumothorax. IMPRESSION: 1. Peribronchial thickening as well as subtle ill-defined pulmonary opacity primarily in the right mid and upper lung zones. This may be infectious or asymmetric edema. 2. Small left pleural effusion. Left basilar aeration has otherwise improved since earlier this month. 3. Mild cardiomegaly. Electronically Signed   By: Narda Rutherford M.D.   On: 04/09/2023 19:56    Pending  Labs Unresulted Labs (From admission, onward)     Start     Ordered   04/10/23 0500  Occult blood card to lab, stool RN will collect  Daily,   R     Question:  Specimen to be collected by:  Answer:  RN will collect   04/09/23 2034  04/10/23 0500  ANA w/Reflex if Positive  Tomorrow morning,   R        04/09/23 2046   04/09/23 2136  Prepare RBC (crossmatch)  (Blood Administration Adult)  Once,   R       Question Answer Comment  # of Units 1 unit   Transfusion Indications Hemoglobin < 7 gm/dL and symptomatic   Number of Units to Keep Ahead NO units ahead   Instructions: Transfuse   If emergent release call blood bank Not emergent release      04/09/23 2137   04/09/23 2115  Sedimentation rate  Once,   R        04/09/23 2115   04/09/23 2046  TSH  Once,   R        04/09/23 2046   04/09/23 2046  CK  Once,   R        04/09/23 2046   04/09/23 2046  C-reactive protein  Once,   R        04/09/23 2046   04/09/23 2046  Rheumatoid factor  Once,   R        04/09/23 2046   04/09/23 2046  Ferritin  Once,   R        04/09/23 2046   04/09/23 2046  Iron and TIBC  Once,   R        04/09/23 2046            Vitals/Pain Today's Vitals   04/09/23 1935 04/09/23 2000 04/09/23 2028 04/09/23 2130  BP: (!) 138/48 (!) 153/60  (!) 159/69  Pulse: 73 70  67  Resp: (!) 24 (!) 23  (!) 26  Temp:  98.2 F (36.8 C)  (!) 97.2 F (36.2 C)  TempSrc:    Oral  SpO2: 93% 95%  91%  Weight:      Height:      PainSc:   0-No pain 0-No pain    Isolation Precautions No active isolations  Medications Medications  0.9 %  sodium chloride infusion (Manually program via Guardrails IV Fluids) (has no administration in time range)  diphenhydrAMINE (BENADRYL) capsule 25 mg (has no administration in time range)  sodium chloride 0.9 % bolus 1,000 mL (0 mLs Intravenous Stopped 04/09/23 2130)    Mobility walks with person assist        R Report given to:

## 2023-04-10 ENCOUNTER — Inpatient Hospital Stay (HOSPITAL_COMMUNITY): Payer: Medicare Other

## 2023-04-10 ENCOUNTER — Other Ambulatory Visit (HOSPITAL_BASED_OUTPATIENT_CLINIC_OR_DEPARTMENT_OTHER): Payer: Medicare Other

## 2023-04-10 DIAGNOSIS — D649 Anemia, unspecified: Secondary | ICD-10-CM | POA: Diagnosis not present

## 2023-04-10 LAB — C DIFFICILE QUICK SCREEN W PCR REFLEX
C Diff antigen: NEGATIVE
C Diff interpretation: NOT DETECTED
C Diff toxin: NEGATIVE

## 2023-04-10 LAB — FOLATE: Folate: 9.5 ng/mL (ref >5.9)

## 2023-04-10 LAB — URINALYSIS, COMPLETE (UACMP) WITH MICROSCOPIC
Bacteria, UA: NONE SEEN
Bilirubin Urine: NEGATIVE
Glucose, UA: NEGATIVE mg/dL
Ketones, ur: NEGATIVE mg/dL
Leukocytes,Ua: NEGATIVE
Nitrite: NEGATIVE
Protein, ur: 30 mg/dL — AB
RBC / HPF: >50 RBC/hpf (ref 0–5)
Specific Gravity, Urine: 1.009 (ref 1.005–1.030)
pH: 5 (ref 5.0–8.0)

## 2023-04-10 LAB — C-REACTIVE PROTEIN: CRP: 4.2 mg/dL — ABNORMAL HIGH (ref <1.0)

## 2023-04-10 LAB — BASIC METABOLIC PANEL
Anion gap: 11 (ref 5–15)
BUN: 55 mg/dL — ABNORMAL HIGH (ref 8–23)
CO2: 20 mmol/L — ABNORMAL LOW (ref 22–32)
Calcium: 8.1 mg/dL — ABNORMAL LOW (ref 8.9–10.3)
Chloride: 107 mmol/L (ref 98–111)
Creatinine, Ser: 3.37 mg/dL — ABNORMAL HIGH (ref 0.44–1.00)
GFR, Estimated: 14 mL/min — ABNORMAL LOW (ref >60)
Glucose, Bld: 85 mg/dL (ref 70–99)
Potassium: 4 mmol/L (ref 3.5–5.1)
Sodium: 138 mmol/L (ref 135–145)

## 2023-04-10 LAB — RETICULOCYTES
Immature Retic Fract: 25.2 % — ABNORMAL HIGH (ref 2.3–15.9)
RBC.: 2.78 MIL/uL — ABNORMAL LOW (ref 3.87–5.11)
Retic Count, Absolute: 85.6 10*3/uL (ref 19.0–186.0)
Retic Ct Pct: 3.1 % (ref 0.4–3.1)

## 2023-04-10 LAB — CBC
HCT: 25.9 % — ABNORMAL LOW (ref 36.0–46.0)
Hemoglobin: 8 g/dL — ABNORMAL LOW (ref 12.0–15.0)
MCH: 28.1 pg (ref 26.0–34.0)
MCHC: 30.9 g/dL (ref 30.0–36.0)
MCV: 90.9 fL (ref 80.0–100.0)
Platelets: 206 10*3/uL (ref 150–400)
RBC: 2.85 MIL/uL — ABNORMAL LOW (ref 3.87–5.11)
RDW: 15.2 % (ref 11.5–15.5)
WBC: 5.5 10*3/uL (ref 4.0–10.5)
nRBC: 0 % (ref 0.0–0.2)

## 2023-04-10 LAB — HEMOGLOBIN AND HEMATOCRIT, BLOOD
HCT: 22.7 % — ABNORMAL LOW (ref 36.0–46.0)
Hemoglobin: 7 g/dL — ABNORMAL LOW (ref 12.0–15.0)

## 2023-04-10 LAB — OCCULT BLOOD X 1 CARD TO LAB, STOOL: Fecal Occult Bld: POSITIVE — AB

## 2023-04-10 LAB — SODIUM, URINE, RANDOM: Sodium, Ur: 74 mmol/L

## 2023-04-10 LAB — VITAMIN B12: Vitamin B-12: 1336 pg/mL — ABNORMAL HIGH (ref 180–914)

## 2023-04-10 LAB — CREATININE, URINE, RANDOM: Creatinine, Urine: 62 mg/dL

## 2023-04-10 MED ORDER — PREDNISONE 20 MG PO TABS
20.0000 mg | ORAL_TABLET | Freq: Every day | ORAL | Status: DC
  Administered 2023-04-11 – 2023-04-14 (×4): 20 mg via ORAL
  Filled 2023-04-10 (×4): qty 1

## 2023-04-10 MED ORDER — KCL-LACTATED RINGERS-D5W 20 MEQ/L IV SOLN
INTRAVENOUS | Status: DC
  Filled 2023-04-10: qty 1000

## 2023-04-10 NOTE — Plan of Care (Signed)

## 2023-04-10 NOTE — Progress Notes (Signed)
1 unit of blood has infused. Pt remained warm, dry,no visible distress. No signs or symptoms of reaction. Vital signs are stable. See flow sheet for blood administration.

## 2023-04-10 NOTE — Progress Notes (Signed)
Progress Note   Patient: Lynn Joseph VWU:[id number] DOB: Mar 21, 1946 DOA: 04/09/2023     1 DOS: the patient was seen and examined on 04/10/2023   Brief hospital course: No notes on file Lynn Joseph is a 77 y.o. female with medical history significant for atrial fibrillation on Eliquis, chronic anemia, hypertension, obesity obesity, obstructive sleep apnea on CPAP nightly, and hypothyroidism who presents with severe fatigue, no energy, and just not feeling well.  This is how she felt when she was admitted to the hospital 10 days ago.  During that hospital stay she received 2 units of packed red blood cells but reports that she really did not feel great even at the time of discharge.  She has been making it and had a day or 2 since discharge where she felt okay but again today she just had no energy so her daughter brought her in for evaluation.  She is not felt at baseline and at least a month.  Does get B12 injections. She denies any bright red blood per rectum or vomiting blood or hematuria or seeing any blood loss at all. She has been having trouble with numbness and tingling in her hands and feet which is very uncomfortable.  She does have some pain in her hands particularly at night.  A month ago she was completely independent driving ambulating etc.  Now she is having difficulty walking and using a cane. No fevers or chills.  The patient will be admitted to the hospitalist service for transfusion and further workup.   Assessment and Plan: Recurrent symptomatic anemia - GI consult - I have requested Dr. Bosie Clos to evaluate. The patient tells me she has had multiple endoscopies in the past but it does not look like she is actually had one since she has been on Eliquis and GI bleed is still a possibility even though she was guaiac negative last week.  She has never had capsular endoscopy.   Work up thus far shwos normocytic normochromic hypoproliferative anemia. Concern for ckd  related vs. Chronic disease related anemia. Check erythropeotin. Will check spep. MDS less likely. Got 1 unit PRBC yesterday. I have sent a message to Dr. Arbutus Ped of [facility] to see if I can engage them.  Iron panel, b12 and folate non actionable. - eliquis held till gi eval done. - regualr diet -   2.  Acute on chronic renal failure -s/p IV fluids and transfusion minimal rsponse thus far. Patint seems to be getting fludi overloadd. Hold more fluids. UA shows large amount of RBC. Get USG KUB. I messaged dr. Glenna Fellows to help.   3.  Obstructive sleep apnea on CPAP -resume. She was prescribed oxygen at night with her CPAP but refused in the past.  Will go ahead and resume oxygen at night while she is in the hospital.   4.  Joint pains, fatigue, numbness and tingling in hands and feet - the patient may benefit from a full autoimmune workup.  Incidentally she mentioned that she actually felt the best when she had a short course of steroids. - patient sed rate is 111. There was no scalp tendernss. No jaw claudication. On exam, I did not identify and range of motion limitation. But patient has been complaining of above symptoms. Concern for PMR. I will check blood culture, acute hepatitis panel t.r.o infection. Malignancy work up is pending. Will sart on low dose prednisone 20 mg daily to monitor response clinically.  Sob since before thanksgiving . With  lower spo2 at home on room air below 90%. Has been using suplementary oxygen in hospital for comfort. Noted to have some crackles in lung - will hold further iv fluids at this time. No fever, non toxic. Monitoring clinically. CXR yesterday is better than before.  Hypothyroid - chornic.  Patient on 100 mcg of Synthroid daily.  However noted to have elevated TSH.  I will check free T4 H tomorrow.      Subjective: Patient offers no new complaints, has been having fatigue.  And reports of poor strength of shoulder and shoulder muscles etc.  Has had poor  appetite.  Physical Exam: Vitals:   04/10/23 0400 04/10/23 0615 04/10/23 0901 04/10/23 1150  BP: (!) 152/53 133/74 (!) 164/52 (!) 171/61  Pulse: 61 63 63 65  Resp:  17 18 18   Temp: 97.7 F (36.5 C) 98.6 F (37 C) 98.8 F (37.1 C) 98.5 F (36.9 C)  TempSrc: Axillary Oral Oral Oral  SpO2: 98% 91% 96% 94%  Weight:      Height:       Neural: Alert awake oriented x 3 no immediate distress apparent. Respiratory exam: Occasional left basilar crackles patient was on oxygen, subsequently weaned down to room air by staff Cardiovascular exam S1-S2 normal Abdomen all quadrant soft nontender Extremities warm without edema.  Patient demonstrate reasonable range of motion of shoulders. And pain-free movement of hips and knees. Data Reviewed:  Labs on Admission:  Results for orders placed or performed during the hospital encounter of 04/09/23 (from the past 24 hours)  Resp panel by RT-PCR (RSV, Flu A&B, Covid) Anterior Nasal Swab     Status: None   Collection Time: 04/09/23  6:31 PM   Specimen: Anterior Nasal Swab  Result Value Ref Range   SARS Coronavirus 2 by RT PCR NEGATIVE NEGATIVE   Influenza A by PCR NEGATIVE NEGATIVE   Influenza B by PCR NEGATIVE NEGATIVE   Resp Syncytial Virus by PCR NEGATIVE NEGATIVE  Urinalysis, Routine w reflex microscopic -Urine, Clean Catch     Status: Abnormal   Collection Time: 04/09/23  8:28 PM  Result Value Ref Range   Color, Urine YELLOW YELLOW   APPearance HAZY (A) CLEAR   Specific Gravity, Urine 1.009 1.005 - 1.030   pH 5.0 5.0 - 8.0   Glucose, UA NEGATIVE NEGATIVE mg/dL   Hgb urine dipstick LARGE (A) NEGATIVE   Bilirubin Urine NEGATIVE NEGATIVE   Ketones, ur NEGATIVE NEGATIVE mg/dL   Protein, ur 30 (A) NEGATIVE mg/dL   Nitrite NEGATIVE NEGATIVE   Leukocytes,Ua NEGATIVE NEGATIVE   RBC / HPF >50 0 - 5 RBC/hpf   WBC, UA 11-20 0 - 5 WBC/hpf   Bacteria, UA NONE SEEN NONE SEEN   Squamous Epithelial / HPF 0-5 0 - 5 /HPF   Mucus PRESENT   TSH      Status: Abnormal   Collection Time: 04/09/23  9:04 PM  Result Value Ref Range   TSH 5.835 (H) 0.350 - 4.500 uIU/mL  CK     Status: None   Collection Time: 04/09/23  9:04 PM  Result Value Ref Range   Total CK 61 38 - 234 U/L  C-reactive protein     Status: Abnormal   Collection Time: 04/09/23  9:05 PM  Result Value Ref Range   CRP 4.2 (H) <1.0 mg/dL  Ferritin     Status: None   Collection Time: 04/09/23  9:05 PM  Result Value Ref Range   Ferritin 88 11 -  307 ng/mL  Iron and TIBC     Status: None   Collection Time: 04/09/23  9:05 PM  Result Value Ref Range   Iron 55 28 - 170 ug/dL   TIBC 161 096 - 045 ug/dL   Saturation Ratios 14 10.4 - 31.8 %   UIBC 326 ug/dL  Sedimentation rate     Status: Abnormal   Collection Time: 04/09/23 10:03 PM  Result Value Ref Range   Sed Rate 111 (H) 0 - 22 mm/hr  Prepare RBC (crossmatch)     Status: None   Collection Time: 04/09/23 10:03 PM  Result Value Ref Range   Order Confirmation      ORDER PROCESSED BY BLOOD BANK Performed at [facility], [address] [place], [place] [address]   Hemoglobin and hematocrit, blood     Status: Abnormal   Collection Time: 04/10/23  5:21 AM  Result Value Ref Range   Hemoglobin 7.0 (L) 12.0 - 15.0 g/dL   HCT 19.1 (L) 47.8 - 29.5 %  Occult blood card to lab, stool RN will collect     Status: Abnormal   Collection Time: 04/10/23  9:31 AM  Result Value Ref Range   Fecal Occult Bld POSITIVE (A) NEGATIVE  C Difficile Quick Screen w PCR reflex     Status: None   Collection Time: 04/10/23  9:31 AM   Specimen: Stool  Result Value Ref Range   C Diff antigen NEGATIVE NEGATIVE   C Diff toxin NEGATIVE NEGATIVE   C Diff interpretation No C. difficile detected.   Vitamin B12     Status: Abnormal   Collection Time: 04/10/23 11:10 AM  Result Value Ref Range   Vitamin B-12 1,336 (H) 180 - 914 pg/mL  Folate     Status: None   Collection Time: 04/10/23 11:10 AM  Result Value Ref Range   Folate  9.5 >5.9 ng/mL  Reticulocytes     Status: Abnormal   Collection Time: 04/10/23 11:10 AM  Result Value Ref Range   Retic Ct Pct 3.1 0.4 - 3.1 %   RBC. 2.78 (L) 3.87 - 5.11 MIL/uL   Retic Count, Absolute 85.6 19.0 - 186.0 K/uL   Immature Retic Fract 25.2 (H) 2.3 - 15.9 %  Basic metabolic panel     Status: Abnormal   Collection Time: 04/10/23 11:10 AM  Result Value Ref Range   Sodium 138 135 - 145 mmol/L   Potassium 4.0 3.5 - 5.1 mmol/L   Chloride 107 98 - 111 mmol/L   CO2 20 (L) 22 - 32 mmol/L   Glucose, Bld 85 70 - 99 mg/dL   BUN 55 (H) 8 - 23 mg/dL   Creatinine, Ser 6.21 (H) 0.44 - 1.00 mg/dL   Calcium 8.1 (L) 8.9 - 10.3 mg/dL   GFR, Estimated 14 (L) >60 mL/min   Anion gap 11 5 - 15  CBC     Status: Abnormal   Collection Time: 04/10/23 11:10 AM  Result Value Ref Range   WBC 5.5 4.0 - 10.5 K/uL   RBC 2.85 (L) 3.87 - 5.11 MIL/uL   Hemoglobin 8.0 (L) 12.0 - 15.0 g/dL   HCT 30.8 (L) 65.7 - 84.6 %   MCV 90.9 80.0 - 100.0 fL   MCH 28.1 26.0 - 34.0 pg   MCHC 30.9 30.0 - 36.0 g/dL   RDW 96.2 95.2 - 84.1 %   Platelets 206 150 - 400 K/uL   nRBC 0.0 0.0 - 0.2 %  Basic Metabolic Panel: Recent Labs  Lab 04/09/23 1700 04/10/23 1110  NA 138 138  K 4.0 4.0  CL 106 107  CO2 21* 20*  GLUCOSE 132* 85  BUN 60* 55*  CREATININE 3.41* 3.37*  CALCIUM 8.2* 8.1*   Liver Function Tests: Recent Labs  Lab 04/09/23 1700  AST 26  ALT 16  ALKPHOS 81  BILITOT 0.9  PROT 7.1  ALBUMIN 3.3*   No results for input(s): "LIPASE", "AMYLASE" in the last 168 hours. No results for input(s): "AMMONIA" in the last 168 hours. CBC: Recent Labs  Lab 04/09/23 1700 04/10/23 0521 04/10/23 1110  WBC 5.0  --  5.5  HGB 7.0* 7.0* 8.0*  HCT 23.0* 22.7* 25.9*  MCV 89.1  --  90.9  PLT 217  --  206   Cardiac Enzymes: Recent Labs  Lab 04/09/23 2104  CKTOTAL 61    BNP (last 3 results) No results for input(s): "PROBNP" in the last 8760 hours. CBG: No results for input(s): "GLUCAP" in the last  168 hours.  Radiological Exams on Admission:  DG Chest 2 View Result Date: 04/09/2023 CLINICAL DATA:  Cough.  Fatigue. EXAM: CHEST - 2 VIEW COMPARISON:  Radiograph 03/30/2023, CT 03/08/2022 FINDINGS: Mild cardiomegaly. Small left pleural effusion, similar to prior with otherwise improved left basilar aeration. Peribronchial thickening as well as subtle ill-defined pulmonary opacity primarily in the right mid and upper lung zones. No pneumothorax. IMPRESSION: 1. Peribronchial thickening as well as subtle ill-defined pulmonary opacity primarily in the right mid and upper lung zones. This may be infectious or asymmetric edema. 2. Small left pleural effusion. Left basilar aeration has otherwise improved since earlier this month. 3. Mild cardiomegaly. Electronically Signed   By: Narda Rutherford M.D.   On: 04/09/2023 19:56     Family Communication: Daughter at bedside, all questions answered  Disposition: Status is: Inpatient Remains inpatient appropriate because: aki persistent.  Planned Discharge Destination: Home    Time spent: 40 minutes  Author: Nolberto Hanlon, MD 04/10/2023 4:40 PM  For on call review [email or web address].

## 2023-04-10 NOTE — Progress Notes (Signed)
Mobility Specialist - Progress Note   04/10/23 0907  Mobility  Activity Ambulated independently in hallway  Level of Assistance Independent  Assistive Device None  Distance Ambulated (ft) 250 ft  Activity Response Tolerated well  Mobility Referral Yes  Mobility visit 1 Mobility  Mobility Specialist Start Time (ACUTE ONLY) 0902  Mobility Specialist Stop Time (ACUTE ONLY) 0907  Mobility Specialist Time Calculation (min) (ACUTE ONLY) 5 min   Pt received in recliner and agreeable to mobility. No complaints during session. Pt to recliner after session with all needs met.    [facility]

## 2023-04-10 NOTE — Progress Notes (Signed)
Pt's daughter questioned if pt's bp medications (Hydralazine and Bystolic) along with Gabapentin, have been ordered. Nurse shared that there was currently no order for any of these medications. Nurse sent Dr. Gasper Sells a secure message, via [employer], about this. She stated that she was holding the Eliquis and Hydralazine, for now and that she will put orders in for the Bystolic and Gabapentin.

## 2023-04-10 NOTE — Progress Notes (Signed)
   04/10/23 2124  BiPAP/CPAP/SIPAP  BiPAP/CPAP/SIPAP Pt Type Adult  BiPAP/CPAP/SIPAP Resmed  Mask Type Full face mask  Mask Size Medium  Respiratory Rate 16 breaths/min  FiO2 (%) 21 %  Patient Home Equipment No  Auto Titrate Yes (Vauto 5-15 cm H2O)  CPAP/SIPAP surface wiped down Yes

## 2023-04-11 ENCOUNTER — Encounter (HOSPITAL_COMMUNITY): Payer: Self-pay | Admitting: Internal Medicine

## 2023-04-11 ENCOUNTER — Telehealth: Payer: Self-pay | Admitting: Pulmonary Disease

## 2023-04-11 DIAGNOSIS — D649 Anemia, unspecified: Secondary | ICD-10-CM | POA: Diagnosis not present

## 2023-04-11 LAB — T4, FREE: Free T4: 0.97 ng/dL (ref 0.61–1.12)

## 2023-04-11 LAB — ERYTHROPOIETIN: Erythropoietin: 31.9 m[IU]/mL — ABNORMAL HIGH (ref 2.6–18.5)

## 2023-04-11 LAB — HEPATITIS PANEL, ACUTE
HCV Ab: NONREACTIVE
Hep A IgM: NONREACTIVE
Hep B C IgM: NONREACTIVE
Hepatitis B Surface Ag: NONREACTIVE

## 2023-04-11 LAB — ANA W/REFLEX IF POSITIVE: Anti Nuclear Antibody (ANA): NEGATIVE

## 2023-04-11 LAB — TSH: TSH: 4.193 u[IU]/mL (ref 0.350–4.500)

## 2023-04-11 LAB — RHEUMATOID FACTOR: Rheumatoid fact SerPl-aCnc: <10 [IU]/mL (ref <14.0)

## 2023-04-11 LAB — HAPTOGLOBIN: Haptoglobin: 266 mg/dL (ref 42–346)

## 2023-04-11 MED ORDER — LACTATED RINGERS IV BOLUS
2000.0000 mL | Freq: Once | INTRAVENOUS | Status: AC
  Administered 2023-04-11: 1000 mL via INTRAVENOUS

## 2023-04-11 MED ORDER — FERROUS SULFATE 325 (65 FE) MG PO TABS
325.0000 mg | ORAL_TABLET | Freq: Every day | ORAL | Status: DC
  Administered 2023-04-12 – 2023-04-14 (×3): 325 mg via ORAL
  Filled 2023-04-11 (×3): qty 1

## 2023-04-11 MED ORDER — ACETAMINOPHEN 325 MG PO TABS
650.0000 mg | ORAL_TABLET | Freq: Four times a day (QID) | ORAL | Status: DC | PRN
  Administered 2023-04-11 – 2023-04-12 (×2): 650 mg via ORAL
  Filled 2023-04-11 (×2): qty 2

## 2023-04-11 MED ORDER — SODIUM BICARBONATE 8.4 % IV SOLN
INTRAVENOUS | Status: DC
  Filled 2023-04-11: qty 1000
  Filled 2023-04-11: qty 150

## 2023-04-11 MED ORDER — ROSUVASTATIN CALCIUM 5 MG PO TABS
5.0000 mg | ORAL_TABLET | Freq: Every day | ORAL | Status: DC
  Administered 2023-04-11 – 2023-04-14 (×4): 5 mg via ORAL
  Filled 2023-04-11 (×4): qty 1

## 2023-04-11 MED ORDER — LACTATED RINGERS IV BOLUS: 1000.0000 mL | Freq: Once | INTRAVENOUS | Status: DC

## 2023-04-11 MED ORDER — FEBUXOSTAT 40 MG PO TABS
40.0000 mg | ORAL_TABLET | Freq: Every day | ORAL | Status: DC
  Administered 2023-04-11 – 2023-04-14 (×4): 40 mg via ORAL
  Filled 2023-04-11 (×4): qty 1

## 2023-04-11 NOTE — Consult Note (Signed)
Reason for Consult: Guaiac positive anemia Referring Physician: Hospital team  Lynn Joseph is an 77 y.o. female.  HPI: Patient seen and examined and case discussed with her daughter her hospital computer chart reviewed and we extensively discussed her gastric bypass and revisions of her surgery and she has been anemic and had trouble with iron for years and her last colonoscopy was 8 or 9 years ago and supposedly was okay and possibly done in our office by Dr. Evette Cristal and she has not seen any blood but was guaiac negative in the hospital last week but was guaiac positive this week and her last set of iron studies were okay probably from her transfusions and she does have some chronic kidney issues but they are acutely worse and she has no specific GI complaints  Past Medical History:  Diagnosis Date   Anemia of chronic disease    Chronic diarrhea    Glaucoma    HTN (hypertension)    Hypertension    Hypothyroid    Obesity    Skin cancer     Past Surgical History:  Procedure Laterality Date   CHOLECYSTECTOMY N/A 03/11/2022   Procedure: LAPAROSCOPIC CHOLECYSTECTOMY;  Surgeon: Fritzi Mandes, MD;  Location: [facility] OR;  Service: General;  Laterality: N/A;   GASTRIC BYPASS     GASTRIC RESTRICTION SURGERY     IR REMOVAL BILIARY DRAIN  04/06/2022   IR SINUS/FIST TUBE CHK-NON GI  03/09/2022   RENAL ANGIOGRAPHY N/A 06/20/2022   Procedure: RENAL ANGIOGRAPHY;  Surgeon: Iran Ouch, MD;  Location: [facility];  Service: Cardiovascular;  Laterality: N/A;   TUBAL LIGATION      Family History  Problem Relation Age of Onset   Hypertension Mother    Thyroid disease Mother    Stroke Mother    Kidney disease Father    Diabetes Father     Social History:  reports that she has never smoked. She has never used smokeless tobacco. She reports that she does not currently use alcohol. She reports that she does not use drugs.  Allergies:  Allergies  Allergen Reactions   Amlodipine  Swelling and Other (See Comments)    Lowered B/P too much, made lethargic, also     Carvedilol Other (See Comments)    Dropped blood pressure too low   Penicillin G Sodium Other (See Comments)    GI upset    Medications: I have reviewed the patient's current medications.  Results for orders placed or performed during the hospital encounter of 04/09/23 (from the past 48 hours)  Comprehensive metabolic panel     Status: Abnormal   Collection Time: 04/09/23  5:00 PM  Result Value Ref Range   Sodium 138 135 - 145 mmol/L   Potassium 4.0 3.5 - 5.1 mmol/L   Chloride 106 98 - 111 mmol/L   CO2 21 (L) 22 - 32 mmol/L   Glucose, Bld 132 (H) 70 - 99 mg/dL    Comment: Glucose reference range applies only to samples taken after fasting for at least 8 hours.   BUN 60 (H) 8 - 23 mg/dL   Creatinine, Ser 1.61 (H) 0.44 - 1.00 mg/dL   Calcium 8.2 (L) 8.9 - 10.3 mg/dL   Total Protein 7.1 6.5 - 8.1 g/dL   Albumin 3.3 (L) 3.5 - 5.0 g/dL   AST 26 15 - 41 U/L   ALT 16 0 - 44 U/L   Alkaline Phosphatase 81 38 - 126 U/L   Total  Bilirubin 0.9 <1.2 mg/dL   GFR, Estimated 13 (L) >60 mL/min    Comment: (NOTE) Calculated using the CKD-EPI Creatinine Equation (2021)    Anion gap 11 5 - 15    Comment: Performed at [facility], [address], [place], [place] [address]  CBC     Status: Abnormal   Collection Time: 04/09/23  5:00 PM  Result Value Ref Range   WBC 5.0 4.0 - 10.5 K/uL   RBC 2.58 (L) 3.87 - 5.11 MIL/uL   Hemoglobin 7.0 (L) 12.0 - 15.0 g/dL   HCT 13.0 (L) 86.5 - 78.4 %   MCV 89.1 80.0 - 100.0 fL   MCH 27.1 26.0 - 34.0 pg   MCHC 30.4 30.0 - 36.0 g/dL   RDW 69.6 29.5 - 28.4 %   Platelets 217 150 - 400 K/uL   nRBC 0.0 0.0 - 0.2 %    Comment: Performed at [facility], [address], [place], [place] [address]  Type and screen [facility]     Status: None   Collection Time: 04/09/23  5:00 PM  Result Value Ref Range   ABO/RH(D) A POS     Antibody Screen NEG    Sample Expiration 04/12/2023,2359    Unit Number [id number]    Blood Component Type RED CELLS,LR    Unit division 00    Status of Unit ISSUED,FINAL    Transfusion Status OK TO TRANSFUSE    Crossmatch Result      Compatible Performed at [facility], [address], [place], [place] [address]   Resp panel by RT-PCR (RSV, Flu A&B, Covid) Anterior Nasal Swab     Status: None   Collection Time: 04/09/23  6:31 PM   Specimen: Anterior Nasal Swab  Result Value Ref Range   SARS Coronavirus 2 by RT PCR NEGATIVE NEGATIVE    Comment: (NOTE) SARS-CoV-2 target nucleic acids are NOT DETECTED.  The SARS-CoV-2 RNA is generally detectable in upper respiratory specimens during the acute phase of infection. The lowest concentration of SARS-CoV-2 viral copies this assay can detect is 138 copies/mL. A negative result does not preclude SARS-Cov-2 infection and should not be used as the sole basis for treatment or other patient management decisions. A negative result may occur with  improper specimen collection/handling, submission of specimen other than nasopharyngeal swab, presence of viral mutation(s) within the areas targeted by this assay, and inadequate number of viral copies(<138 copies/mL). A negative result must be combined with clinical observations, patient history, and epidemiological information. The expected result is Negative.  Fact Sheet for Patients:  BloggerCourse.com  Fact Sheet for Healthcare Providers:  SeriousBroker.it  This test is no t yet approved or cleared by the [place] FDA and  has been authorized for detection and/or diagnosis of SARS-CoV-2 by FDA under an Emergency Use Authorization (EUA). This EUA will remain  in effect (meaning this test can be used) for the duration of the COVID-19 declaration under Section 564(b)(1) of the Act, 21 U.S.C.section 360bbb-3(b)(1),  unless the authorization is terminated  or revoked sooner.       Influenza A by PCR NEGATIVE NEGATIVE   Influenza B by PCR NEGATIVE NEGATIVE    Comment: (NOTE) The Xpert Xpress SARS-CoV-2/FLU/RSV plus assay is intended as an aid in the diagnosis of influenza from Nasopharyngeal swab specimens and should not be used as a sole basis for treatment. Nasal washings and aspirates are unacceptable for Xpert Xpress SARS-CoV-2/FLU/RSV testing.  Fact Sheet for Patients: BloggerCourse.com  Fact Sheet for Healthcare Providers: SeriousBroker.it  This test is not yet approved or cleared by the [place] FDA and has been authorized for detection and/or diagnosis of SARS-CoV-2 by FDA under an Emergency Use Authorization (EUA). This EUA will remain in effect (meaning this test can be used) for the duration of the COVID-19 declaration under Section 564(b)(1) of the Act, 21 U.S.C. section 360bbb-3(b)(1), unless the authorization is terminated or revoked.     Resp Syncytial Virus by PCR NEGATIVE NEGATIVE    Comment: (NOTE) Fact Sheet for Patients: BloggerCourse.com  Fact Sheet for Healthcare Providers: SeriousBroker.it  This test is not yet approved or cleared by the [place] FDA and has been authorized for detection and/or diagnosis of SARS-CoV-2 by FDA under an Emergency Use Authorization (EUA). This EUA will remain in effect (meaning this test can be used) for the duration of the COVID-19 declaration under Section 564(b)(1) of the Act, 21 U.S.C. section 360bbb-3(b)(1), unless the authorization is terminated or revoked.  Performed at [facility], [address], [place], [place] [address]   Urinalysis, Routine w reflex microscopic -Urine, Clean Catch     Status: Abnormal   Collection Time: 04/09/23  8:28 PM  Result Value Ref Range   Color, Urine YELLOW YELLOW    APPearance HAZY (A) CLEAR   Specific Gravity, Urine 1.009 1.005 - 1.030   pH 5.0 5.0 - 8.0   Glucose, UA NEGATIVE NEGATIVE mg/dL   Hgb urine dipstick LARGE (A) NEGATIVE   Bilirubin Urine NEGATIVE NEGATIVE   Ketones, ur NEGATIVE NEGATIVE mg/dL   Protein, ur 30 (A) NEGATIVE mg/dL   Nitrite NEGATIVE NEGATIVE   Leukocytes,Ua NEGATIVE NEGATIVE   RBC / HPF >50 0 - 5 RBC/hpf   WBC, UA 11-20 0 - 5 WBC/hpf   Bacteria, UA NONE SEEN NONE SEEN   Squamous Epithelial / HPF 0-5 0 - 5 /HPF   Mucus PRESENT     Comment: Performed at [facility], [address] [place], [place] [address]  TSH     Status: Abnormal   Collection Time: 04/09/23  9:04 PM  Result Value Ref Range   TSH 5.835 (H) 0.350 - 4.500 uIU/mL    Comment: Performed by a 3rd Generation assay with a functional sensitivity of <=0.01 uIU/mL. Performed at [facility], [address], [place], [place] [address]   CK     Status: None   Collection Time: 04/09/23  9:04 PM  Result Value Ref Range   Total CK 61 38 - 234 U/L    Comment: Performed at [facility], [address] [place], [place] [address]  Rheumatoid factor     Status: None   Collection Time: 04/09/23  9:04 PM  Result Value Ref Range   Rheumatoid fact SerPl-aCnc <10.0 <14.0 IU/mL    Comment: (NOTE) Performed At: [facility] [address] [place], [place] [id number] Jolene Schimke MD [id number]   C-reactive protein     Status: Abnormal   Collection Time: 04/09/23  9:05 PM  Result Value Ref Range   CRP 4.2 (H) <1.0 mg/dL    Comment: Performed at [facility], [address], [place], [place] [address]  Ferritin     Status: None   Collection Time: 04/09/23  9:05 PM  Result Value Ref Range   Ferritin 88 11 - 307 ng/mL    Comment: Performed at [facility], [address], [place], [place] [address]  Iron and TIBC     Status: None   Collection Time: 04/09/23  9:05 PM   Result Value Ref Range   Iron 55 28 - 170 ug/dL   TIBC 657 846 - 962 ug/dL   Saturation Ratios 14 10.4 - 31.8 %   UIBC 326 ug/dL    Comment: Performed at [facility], [address], [place], [place] [address]  Sedimentation rate     Status: Abnormal   Collection Time: 04/09/23 10:03 PM  Result Value Ref Range   Sed Rate 111 (H) 0 - 22 mm/hr    Comment: Performed at [facility], [address], [place], [place] [address]  Prepare RBC (crossmatch)     Status: None   Collection Time: 04/09/23 10:03 PM  Result Value Ref Range   Order Confirmation      ORDER PROCESSED BY BLOOD BANK Performed at [facility], [address], [place], [place] [address]   ANA w/Reflex if Positive     Status: None   Collection Time: 04/10/23  4:52 AM  Result Value Ref Range   Anti Nuclear Antibody (ANA) Negative Negative    Comment: (NOTE) Performed At: [facility] [address] [place], [place] [id number] Jolene Schimke MD [id number]   Hemoglobin and hematocrit, blood     Status: Abnormal   Collection Time: 04/10/23  5:21 AM  Result Value Ref Range   Hemoglobin 7.0 (L) 12.0 - 15.0 g/dL   HCT 64.3 (L) 32.9 - 51.8 %    Comment: Performed at [facility], [address], [place], [place] [address]  Occult blood card to lab, stool RN will collect     Status: Abnormal   Collection Time: 04/10/23  9:31 AM  Result Value Ref Range   Fecal Occult Bld POSITIVE (A) NEGATIVE    Comment: Performed at [facility], [address] [place], [place] [address]  C Difficile Quick Screen w PCR reflex     Status: None   Collection Time: 04/10/23  9:31 AM   Specimen: Stool  Result Value Ref Range   C Diff antigen NEGATIVE NEGATIVE   C Diff toxin NEGATIVE NEGATIVE   C Diff interpretation No C. difficile detected.     Comment: Performed at [facility], [address] [place], [place]  [address]  Vitamin B12     Status: Abnormal   Collection Time: 04/10/23 11:10 AM  Result Value Ref Range   Vitamin B-12 1,336 (H) 180 - 914 pg/mL    Comment: (NOTE) This assay is not validated for testing neonatal or myeloproliferative syndrome specimens for Vitamin B12 levels. Performed at [facility], [address] [place], [place] [address]   Folate     Status: None   Collection Time: 04/10/23 11:10 AM  Result Value Ref Range   Folate 9.5 >5.9 ng/mL    Comment: Performed at [facility], [address] [place], [place] [address]  Reticulocytes     Status: Abnormal   Collection Time: 04/10/23 11:10 AM  Result Value Ref Range   Retic Ct Pct 3.1 0.4 - 3.1 %   RBC. 2.78 (L) 3.87 - 5.11 MIL/uL   Retic Count, Absolute 85.6 19.0 - 186.0 K/uL   Immature Retic Fract 25.2 (H) 2.3 - 15.9 %    Comment: Performed at [facility], [address], [place], [place] [address]  Haptoglobin     Status: None   Collection Time: 04/10/23 11:10 AM  Result Value Ref Range   Haptoglobin 266 42 - 346 mg/dL    Comment: (NOTE) Performed At: [name] [address] [place], [place] [id number] Jolene Schimke MD [id number]   Basic metabolic panel     Status: Abnormal   Collection Time: 04/10/23 11:10 AM  Result Value Ref Range   Sodium 138 135 - 145 mmol/L   Potassium 4.0 3.5 - 5.1 mmol/L   Chloride 107 98 - 111 mmol/L   CO2 20 (L) 22 - 32 mmol/L   Glucose, Bld 85 70 - 99 mg/dL    Comment: Glucose reference range applies only to samples taken after fasting for at least 8 hours.   BUN 55 (H) 8 - 23 mg/dL   Creatinine, Ser 0.10 (H) 0.44 - 1.00 mg/dL   Calcium 8.1 (L) 8.9 - 10.3 mg/dL   GFR, Estimated 14 (L) >60 mL/min    Comment: (NOTE) Calculated using the CKD-EPI Creatinine Equation (2021)    Anion gap 11 5 - 15    Comment: Performed at [facility], [address], [place], [place] [address]  CBC      Status: Abnormal   Collection Time: 04/10/23 11:10 AM  Result Value Ref Range   WBC 5.5 4.0 - 10.5 K/uL   RBC 2.85 (L) 3.87 - 5.11 MIL/uL   Hemoglobin 8.0 (L) 12.0 - 15.0 g/dL   HCT 66.4 (L) 40.3 - 47.4 %   MCV 90.9 80.0 - 100.0 fL   MCH 28.1 26.0 - 34.0 pg   MCHC 30.9 30.0 - 36.0 g/dL   RDW 25.9 56.3 - 87.5 %   Platelets 206 150 - 400 K/uL   nRBC 0.0 0.0 - 0.2 %    Comment: Performed at [facility], [address], [place], [place] [address]  Culture, blood (Routine X 2) w Reflex to ID Panel     Status: None (Preliminary result)   Collection Time: 04/10/23  7:06 PM   Specimen: BLOOD  Result Value Ref Range   Specimen Description      BLOOD BLOOD RIGHT ARM AEROBIC BOTTLE ONLY Performed at [facility], [address], [place], [place] [address]    Special Requests      BOTTLES DRAWN AEROBIC AND ANAEROBIC Blood Culture results may not be optimal due to an inadequate volume of blood received in culture bottles Performed at [facility], [address], [place], [place] [address]    Culture      NO GROWTH < 12 HOURS Performed at [facility], [address] [place], [place] [address]    Report Status PENDING   Culture, blood (Routine X 2) w Reflex to ID Panel     Status: None (Preliminary result)   Collection Time: 04/10/23  7:06 PM   Specimen: BLOOD  Result Value Ref Range   Specimen Description      BLOOD BLOOD LEFT ARM Performed at [facility], [address], [place], [place] [address]    Special Requests      BOTTLES DRAWN AEROBIC AND ANAEROBIC Blood Culture results may not be optimal due to an inadequate volume of blood received in culture bottles Performed at [facility], [address], [place], [place] [address]    Culture      NO GROWTH < 12 HOURS Performed at [facility], [address] [place], [place] [address]    Report Status PENDING   Sodium,  urine, random     Status: None   Collection Time: 04/10/23  7:14 PM  Result Value Ref Range   Sodium, Ur 74 mmol/L    Comment: Performed at [facility], [address], [place], [place] [address]  Creatinine, urine, random     Status: None   Collection Time: 04/10/23  7:14 PM  Result Value Ref Range   Creatinine, Urine 62 mg/dL    Comment: Performed at [facility], [address], [place], [place] [address]  Urinalysis, Complete w Microscopic -Urine, Clean Catch     Status: Abnormal   Collection Time: 04/10/23  7:14 PM  Result Value Ref Range   Color, Urine YELLOW YELLOW   APPearance HAZY (A) CLEAR   Specific Gravity, Urine 1.009 1.005 - 1.030   pH 5.0 5.0 - 8.0   Glucose, UA NEGATIVE NEGATIVE mg/dL   Hgb urine dipstick LARGE (A) NEGATIVE   Bilirubin Urine NEGATIVE NEGATIVE   Ketones, ur NEGATIVE NEGATIVE mg/dL   Protein, ur 30 (A) NEGATIVE mg/dL   Nitrite NEGATIVE NEGATIVE   Leukocytes,Ua NEGATIVE NEGATIVE   RBC / HPF >50 0 - 5 RBC/hpf   WBC, UA 6-10 0 - 5 WBC/hpf   Bacteria, UA NONE SEEN NONE SEEN   Squamous Epithelial / HPF 0-5 0 - 5 /HPF    Comment: Performed at [facility], [address] [place], [place] [address]  Hepatitis panel, acute     Status: None (Preliminary result)   Collection Time: 04/11/23  5:27 AM  Result Value Ref Range   Hepatitis B Surface Ag NON REACTIVE NON REACTIVE    Comment: Performed at [facility], [address] [place], [place] [address]   HCV Ab PENDING NON REACTIVE   Hep A IgM PENDING NON REACTIVE   Hep B C IgM PENDING NON REACTIVE  T4, free     Status: None   Collection Time: 04/11/23  5:27 AM  Result Value Ref Range   Free T4 0.97 0.61 - 1.12 ng/dL    Comment: (NOTE) Biotin ingestion may interfere with free T4 tests. If the results are inconsistent with the TSH level, previous test results, or the clinical presentation, then consider biotin interference. If  needed, order repeat testing after stopping biotin. Performed at [facility], [address] [place], [place] [address]   TSH     Status: None   Collection Time: 04/11/23  5:27 AM  Result Value Ref Range   TSH 4.193 0.350 - 4.500 uIU/mL    Comment: Performed by a 3rd Generation assay with a functional sensitivity of <=0.01 uIU/mL. Performed at [facility], [address] [place], [place] [address]     US RENAL Result Date: 04/11/2023 CLINICAL DATA:  Acute kidney injury EXAM: RENAL / URINARY TRACT ULTRASOUND COMPLETE COMPARISON:  CT chest abdomen and pelvis 03/08/2022 FINDINGS: Right Kidney: Renal measurements: 8.1 x 4.6 x 3.9 cm = volume: 76 mL. Echogenicity is increased. No mass or hydronephrosis visualized. Left Kidney: Renal measurements: 9.4 x 5.2 x 3.3 cm = volume: 85 mL. Echogenicity is increased. No mass or hydronephrosis visualized. Bladder: Appears normal for degree of bladder distention. Other: None. IMPRESSION: 1. No hydronephrosis. 2. Increased renal echogenicity bilaterally consistent with medical renal disease. Electronically Signed   By: Darliss Cheney M.D.   On: 04/11/2023 01:05   DG  Chest 2 View Result Date: 04/09/2023 CLINICAL DATA:  Cough.  Fatigue. EXAM: CHEST - 2 VIEW COMPARISON:  Radiograph 03/30/2023, CT 03/08/2022 FINDINGS: Mild cardiomegaly. Small left pleural effusion, similar to prior with otherwise improved left basilar aeration. Peribronchial thickening as well as subtle ill-defined pulmonary opacity primarily in the right mid and upper lung zones. No pneumothorax. IMPRESSION: 1. Peribronchial thickening as well as subtle ill-defined pulmonary opacity primarily in the right mid and upper lung zones. This may be infectious or asymmetric edema. 2. Small left pleural effusion. Left basilar aeration has otherwise improved since earlier this month. 3. Mild cardiomegaly. Electronically Signed   By: Narda Rutherford M.D.   On: 04/09/2023 19:56     ROS negative except above Blood pressure (!) 142/55, pulse 64, temperature 97.9 F (36.6 C), temperature source Oral, resp. rate 17, height 5\' 5"  (1.651 m), weight 98.9 kg, SpO2 90%. 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

## 2023-04-11 NOTE — Consult Note (Addendum)
Renal Service Consult Note [facility]  Lynn Joseph 04/11/2023 Lynn Krabbe, MD Requesting Physician: Dr. Jacqulyn Bath, R.   Reason for Consult: Renal failure HPI: The patient is a 77 y.o. year-old w/ PMH as below who presented to ED 12/17 for fatigue, more pale than usual and low Hb (7.2) on outpt lab tests. Hx of atrial fib on eliquis, chronic anemia, HTN, obesity, OSA on CPAP, hypothyroidism. Recent hospital admit was for anemia, she rec'd 2 units prbc's. Gets B12 injections. No bloody stool or other bleeding. Pt seen in ED w/ stable VS BP 160/55, HR 60- 70, RR 18, afeb. Labs showed creat 3.4 (baseline 1.6- 1.9), BUN 60, Hb 7.0, wbc 5K plt wnl. EPO level was high at 31.9 (2- 18). ANA sent and is negative. RA < 10. UA showed large Hb, >50 rbc, 11-20 wbc, 0-5 epis, prot 30. She was + for occult blood. CXR showed no edema, no sig CHF.  Pt was admitted and given 1 L bolus and some IVF's. Today creat was down slightly at 3.3. We are asked to see for renal failure.   Pt seen in room. Daughter joined in on the phone. Pt had obesity stapling procedure that "went wrong" in the 1980's.  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 She reports also having a kidney procedure 4-5 yrs ago and again in Jan of this year, to attempt to "open up" the kidney "tubule".   Pt is pleasant, denies any LUTS, no SOB, no orthopnea, no hematuria. Is having lots of "joint pains" and achiness all over. No difficulty voiding.   ROS - denies CP, no joint pain, no HA, no blurry vision, no rash, no diarrhea, no nausea/ vomiting, no dysuria, no difficulty voiding   Past Medical History  Past Medical History:  Diagnosis Date   Anemia of chronic disease    Chronic diarrhea    Glaucoma    HTN (hypertension)    Hypertension    Hypothyroid    Obesity    Skin cancer    Past Surgical History  Past Surgical History:  Procedure Laterality Date    CHOLECYSTECTOMY N/A 03/11/2022   Procedure: LAPAROSCOPIC CHOLECYSTECTOMY;  Surgeon: Fritzi Mandes, MD;  Location: [facility] OR;  Service: General;  Laterality: N/A;   GASTRIC BYPASS     GASTRIC RESTRICTION SURGERY     IR REMOVAL BILIARY DRAIN  04/06/2022   IR SINUS/FIST TUBE CHK-NON GI  03/09/2022   RENAL ANGIOGRAPHY N/A 06/20/2022   Procedure: RENAL ANGIOGRAPHY;  Surgeon: Iran Ouch, MD;  Location: [facility] INVASIVE CV LAB;  Service: Cardiovascular;  Laterality: N/A;   TUBAL LIGATION     Family History  Family History  Problem Relation Age of Onset   Hypertension Mother    Thyroid disease Mother    Stroke Mother    Kidney disease Father    Diabetes Father    Social History  reports that she has never smoked. She has never used smokeless tobacco. She reports that she does not currently use alcohol. She reports that she does not use drugs. Allergies  Allergies  Allergen Reactions   Amlodipine Swelling and Other (See Comments)    Lowered B/P too much, made lethargic, also     Carvedilol Other (See Comments)    Dropped blood pressure too low   Penicillin G Sodium Other (See Comments)    GI upset   Home medications Prior to Admission  medications   Medication Sig Start Date End Date Taking? Authorizing Provider  apixaban (ELIQUIS) 5 MG TABS tablet Take 1 tablet (5 mg total) by mouth 2 (two) times daily. 04/01/23  Yes Pahwani, Daleen Bo, MD  Calcium-Magnesium-Zinc (CALCIUM-MAGNESUIUM-ZINC PO) Take 1 tablet by mouth 2 (two) times a week.   Yes [provider]  colchicine 0.6 MG tablet Take 0.3 mg by mouth daily as needed (AS DIRECTED for gout flares).   Yes [provider]  cyanocobalamin (VITAMIN B12) 1000 MCG/ML injection Inject 1,000 mcg into the muscle every 30 (thirty) days.   Yes [provider]  febuxostat (ULORIC) 40 MG tablet Take 40 mg by mouth daily.   Yes [provider]  ferrous sulfate 325 (65 FE) MG tablet Take 1 tablet (325 mg total) by mouth  daily with breakfast. 03/31/23 04/30/23 Yes Pahwani, Daleen Bo, MD  furosemide (LASIX) 20 MG tablet Take 20 mg by mouth daily as needed (for a weight gain of 4-5 pounds in the period of a couple of days).   Yes [provider]  gabapentin (NEURONTIN) 100 MG capsule Take 100 mg by mouth at bedtime.   Yes [provider]  hydrALAZINE (APRESOLINE) 100 MG tablet Take 1 tablet (100 mg total) by mouth 3 (three) times daily. 05/30/22  Yes Antonieta Iba, MD  Levothyroxine Sodium (TIROSINT) 100 MCG CAPS Take 1 capsule (100 mcg total) by mouth daily before breakfast. Patient taking differently: Take 100 mcg by mouth daily before breakfast. 03/19/22  Yes Luciano Cutter, MD  Nebivolol HCl (BYSTOLIC) 20 MG TABS Take 1 tablet (20 mg total) by mouth 2 (two) times daily. 10/23/22  Yes Antonieta Iba, MD  rosuvastatin (CRESTOR) 5 MG tablet Take 1 tablet (5 mg total) by mouth daily. 05/30/22  Yes Gollan, Tollie Pizza, MD  TYLENOL 500 MG tablet Take 1,000 mg by mouth every 6 (six) hours as needed for mild pain (pain score 1-3), headache or fever.   Yes [provider]     Vitals:   04/10/23 1710 04/10/23 2157 04/11/23 0542 04/11/23 1243  BP: (!) 160/62 (!) 147/54 (!) 142/55 (!) 153/69  Pulse: 63 65 64 64  Resp: 16 18 17 17   Temp: 97.8 F (36.6 C) 98.8 F (37.1 C) 97.9 F (36.6 C) 98.4 F (36.9 C)  TempSrc: Oral  Oral Oral  SpO2: 97% (!) 88% 90% 94%  Weight:      Height:       Exam Gen alert, no distress, obese No rash, cyanosis or gangrene Sclera anicteric, throat clear  No jvd or bruits Chest clear bilat to bases, no rales/ wheezing RRR no MRG Abd soft ntnd no mass or ascites +bs, no bruits GU defer MS no joint effusions or deformity Ext no pitting UE or LE edema, no other edema, no wounds or ulcers Neuro is alert, Ox 3 , nf       Renal-related home meds: - eliquis 5 bid - lasix 20mg  every day prn for wt gain - gabapentin 100 hs - hydralazine 100 tid - bystolic 20mg   bid - others: statin, uloric, colchicine, vits/ supps    Date   Creat  eGFR (ml/min)  2020   1.38- 1.72 29- 38   12/22/21  1.56  34 ml/min   03/08/22  2.71 >> 1.58 18 >> 34 ml/min  06/05/22  1.65  32 ml/min    12/07- 03/31/23 2.51 >> 1.99 19 >> 25 ml/min  04/09/23  3.41  13 ml/min  04/10/23  3.37  14 ml/min     Apr 06, 2022 - Dr Elby Showers did biliary drain injection and removal    Jun 20, 2022 - Dr Kirke Corin did bilat renal angiography showing "no sig renal artery stenosis". Normal L renal artery and mild nonobstructive disease affecting the R renal artery.  40cc contrast used.        Renal US - 8.1/ 9.4 cm kidneys w/o hydro, + echo bilat    UA - prot 30, rbc > 50, wbc 5-10, 0-5 epis, no bact    UNa 74,  UCr 62     Hep B, hep C negative    EPO 31.9 ^^ (2- 18)     RF negative     ANA negative     Fe tsat 14%, ferritin 88     SPEP pending      Assessment/ Plan: AKI on CKD 4 - b/l creatinine 1.65- 1.99 from feb- dec 2024, eGFR 25- 32 ml/min. Creat here is 3.4 in setting of low Hb, and fatigue. Has had sig renal failure since 2020 but has not seen nephrologist. Urine lytes look like advanced CKD. UA shows +rbcs >> wbc's, min proteinuria. Will get UP/C ratio, send off more serologies and serum FLC. She may be dry, will ^IVF"s to see if we can get creatinine back down under 2- 2.5. Renal US showing chronic disease and no obstruction. Will look at sediment tomorrow. Will follow.   Anemia - acute on chronic, getting prbc's.  HTN - long hx 20- 30 yrs Obesity - hx of roux-en-Y in 2009.  OSA on CPAP Joint pains / fatigue - sending renal serologies      Vinson Moselle  MD [facility] 04/11/2023, 3:25 PM  Recent Labs  Lab 04/09/23 1700 04/10/23 0521 04/10/23 1110  HGB 7.0* 7.0* 8.0*  ALBUMIN 3.3*  --   --   CALCIUM 8.2*  --  8.1*  CREATININE 3.41*  --  3.37*  K 4.0  --  4.0   Inpatient medications:  febuxostat  40 mg Oral Daily   [START ON 04/12/2023] ferrous sulfate  325 mg Oral Q breakfast    gabapentin  100 mg Oral QHS   levothyroxine  100 mcg Oral Q0600   nebivolol  20 mg Oral BID   predniSONE  20 mg Oral Q breakfast   rosuvastatin  5 mg Oral Daily    acetaminophen

## 2023-04-11 NOTE — Progress Notes (Signed)
   04/11/23 2022  BiPAP/CPAP/SIPAP  BiPAP/CPAP/SIPAP Pt Type Adult (prefers self placement)  BiPAP/CPAP/SIPAP Resmed  Mask Type Full face mask ([place] style FFM from home)  FiO2 (%) 21 %  Patient Home Equipment No (mask from home)  Auto Titrate Yes (5-15)  CPAP/SIPAP surface wiped down Yes  BiPAP/CPAP /SiPAP Vitals  Pulse Rate 67  Resp 15  SpO2 96 %  Bilateral Breath Sounds Clear  MEWS Score/Color  MEWS Score 0  MEWS Score Color Green

## 2023-04-11 NOTE — Plan of Care (Signed)

## 2023-04-11 NOTE — Progress Notes (Signed)
PROGRESS NOTE    Lynn Joseph  WUJ:[id number] DOB: 27-Nov-1945 DOA: 04/09/2023 PCP: Alysia Penna, MD   Brief Narrative:  Lynn Joseph is a 77 y.o. female with medical history significant for atrial fibrillation on Eliquis, chronic anemia, hypertension, obesity obesity, obstructive sleep apnea on CPAP nightly, and hypothyroidism who presents with severe fatigue, no energy, and just not feeling well.  This is how she felt when she was admitted to the hospital 10 days ago.  During that hospital stay she received 2 units of packed red blood cells but reports that she really did not feel great even at the time of discharge.  She has been making it and had a day or 2 since discharge where she felt okay but again today she just had no energy so her daughter brought her in for evaluation.  She is not felt at baseline and at least a month.  Does get B12 injections. She denies any bright red blood per rectum or vomiting blood or hematuria or seeing any blood loss at all. She has been having trouble with numbness and tingling in her hands and feet which is very uncomfortable.  She does have some pain in her hands particularly at night.  A month ago she was completely independent driving ambulating etc.  Now she is having difficulty walking and using a cane. No fevers or chills.  The patient will be admitted to the hospitalist service for transfusion and further workup.  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  Hemoglobin dropped to 7 this admission requiring 1 unit of PRBC transfusion.  Hemoglobin 8 posttransfusion.  FOBT positive.  Per patient, she has had multiple endoscopies in the past but it does not  look like she has actually had one since she has been on Eliquis.  Concern for hematological malignancy, previous hospitalist had consulted oncology/Dr. Arbutus Ped and consulted GI Dr. Lavonia Drafts.  Awaiting evaluation.  Eliquis on hold.  AKI on CKD stage IIIb: Baseline creatinine around 1.6-1.7, was recently discharged with 1.9.  Presented with 3.4 and has been stable.  Avoid nephrotoxic agents.  Nephrology to see.  Obstructive sleep apnea: CPAP.  Fatigue/joint pains/numbness and tingling in hands and feet: Apparently she was on short course of steroids at some point in time and she felt better.  ESR and CRP elevated, ANA pending.  RF negative.  Protein electrophoresis pending.  She has been started on prednisone empirically by previous hospitalist.  Acquired hypothyroidism: Continue Synthroid.  DVT prophylaxis: Place and maintain sequential compression device Start: 04/10/23 1309   Code Status: Prior  Family Communication:  None present at bedside.  Plan of care discussed with patient in length and he/she verbalized understanding and agreed with it.  Also discussed with her daughter over the video call in her room.  Status is: Inpatient Remains inpatient appropriate because: Elevated creatinine, needs to be seen by oncology, GI and nephrology.   Estimated body mass index is 36.28 kg/m as calculated from the following:   Height as of this encounter: 5\' 5"  (1.651 m).   Weight as of this encounter: 98.9 kg.    Nutritional Assessment: Body mass index is 36.28 kg/m.Marland Kitchen Seen by dietician.  I agree with the assessment and plan as  outlined below: Nutrition Status:        . Skin Assessment: I have examined the patient's skin and I agree with the wound assessment as performed by the wound care RN as outlined below:    Consultants:  Oncology, GI and nephrology  Procedures:  None  Antimicrobials:  Anti-infectives (From admission, onward)    None         Subjective: Patient seen and  examined, complains of weakness and fatigue, slightly more than yesterday.  No other complaint.  Denies any abdominal pain, shortness of breath or chest pain.  Encouraged her to use incentive spirometry.  Objective: Vitals:   04/10/23 1150 04/10/23 1710 04/10/23 2157 04/11/23 0542  BP: (!) 171/61 (!) 160/62 (!) 147/54 (!) 142/55  Pulse: 65 63 65 64  Resp: 18 16 18 17   Temp: 98.5 F (36.9 C) 97.8 F (36.6 C) 98.8 F (37.1 C) 97.9 F (36.6 C)  TempSrc: Oral Oral  Oral  SpO2: 94% 97% (!) 88% 90%  Weight:      Height:        Intake/Output Summary (Last 24 hours) at 04/11/2023 0833 Last data filed at 04/11/2023 0540 Gross per 24 hour  Intake 1298.07 ml  Output 500 ml  Net 798.07 ml   Filed Weights   04/09/23 1653  Weight: 98.9 kg    Examination:  General exam: Appears calm and comfortable  Respiratory system: Diminished breath sounds at the bases bilaterally. Respiratory effort normal. Cardiovascular system: S1 & S2 heard, RRR. No JVD, murmurs, rubs, gallops or clicks. No pedal edema. Gastrointestinal system: Abdomen is nondistended, soft and nontender. No organomegaly or masses felt. Normal bowel sounds heard. Central nervous system: Alert and oriented. No focal neurological deficits. Extremities: Symmetric 5 x 5 power. Skin: No rashes, lesions or ulcers Psychiatry: Judgement and insight appear normal. Mood & affect appropriate.    Data Reviewed: I have personally reviewed following labs and imaging studies  CBC: Recent Labs  Lab 04/09/23 1700 04/10/23 0521 04/10/23 1110  WBC 5.0  --  5.5  HGB 7.0* 7.0* 8.0*  HCT 23.0* 22.7* 25.9*  MCV 89.1  --  90.9  PLT 217  --  206   Basic Metabolic Panel: Recent Labs  Lab 04/09/23 1700 04/10/23 1110  NA 138 138  K 4.0 4.0  CL 106 107  CO2 21* 20*  GLUCOSE 132* 85  BUN 60* 55*  CREATININE 3.41* 3.37*  CALCIUM 8.2* 8.1*   GFR: Estimated Creatinine Clearance: 16.3 mL/min (A) (by C-G formula based on SCr of 3.37  mg/dL (H)). Liver Function Tests: Recent Labs  Lab 04/09/23 1700  AST 26  ALT 16  ALKPHOS 81  BILITOT 0.9  PROT 7.1  ALBUMIN 3.3*   No results for input(s): "LIPASE", "AMYLASE" in the last 168 hours. No results for input(s): "AMMONIA" in the last 168 hours. Coagulation Profile: No results for input(s): "INR", "PROTIME" in the last 168 hours. Cardiac Enzymes: Recent Labs  Lab 04/09/23 2104  CKTOTAL 61   BNP (last 3 results) No results for input(s): "PROBNP" in the last 8760 hours. HbA1C: No results for input(s): "HGBA1C" in the last 72 hours. CBG: No results for input(s): "GLUCAP" in the last 168 hours. Lipid Profile: No results for input(s): "CHOL", "HDL", "LDLCALC", "TRIG", "CHOLHDL", "LDLDIRECT" in the last 72 hours. Thyroid Function Tests: Recent Labs    04/11/23 0527  TSH 4.193   Anemia Panel: Recent Labs    04/09/23 2105 04/10/23 1110  VITAMINB12  --  1,336*  FOLATE  --  9.5  FERRITIN 88  --   TIBC 381  --   IRON 55  --   RETICCTPCT  --  3.1   Sepsis Labs: No results for input(s): "PROCALCITON", "LATICACIDVEN" in the last 168 hours.  Recent Results (from the past 240 hours)  Resp panel by RT-PCR (RSV, Flu A&B, Covid) Anterior Nasal Swab     Status: None   Collection Time: 04/09/23  6:31 PM   Specimen: Anterior Nasal Swab  Result Value Ref Range Status   SARS Coronavirus 2 by RT PCR NEGATIVE NEGATIVE Final    Comment: (NOTE) SARS-CoV-2 target nucleic acids are NOT DETECTED.  The SARS-CoV-2 RNA is generally detectable in upper respiratory specimens during the acute phase of infection. The lowest concentration of SARS-CoV-2 viral copies this assay can detect is 138 copies/mL. A negative result does not preclude SARS-Cov-2 infection and should not be used as the sole basis for treatment or other patient management decisions. A negative result may occur with  improper specimen collection/handling, submission of specimen other than nasopharyngeal  swab, presence of viral mutation(s) within the areas targeted by this assay, and inadequate number of viral copies(<138 copies/mL). A negative result must be combined with clinical observations, patient history, and epidemiological information. The expected result is Negative.  Fact Sheet for Patients:  BloggerCourse.com  Fact Sheet for Healthcare Providers:  SeriousBroker.it  This test is no t yet approved or cleared by the [place] FDA and  has been authorized for detection and/or diagnosis of SARS-CoV-2 by FDA under an Emergency Use Authorization (EUA). This EUA will remain  in effect (meaning this test can be used) for the duration of the COVID-19 declaration under Section 564(b)(1) of the Act, 21 U.S.C.section 360bbb-3(b)(1), unless the authorization is terminated  or revoked sooner.       Influenza A by PCR NEGATIVE NEGATIVE Final   Influenza B by PCR NEGATIVE NEGATIVE Final    Comment: (NOTE) The Xpert Xpress SARS-CoV-2/FLU/RSV plus assay is intended as an aid in the diagnosis of influenza from Nasopharyngeal swab specimens and should not be used as a sole basis for treatment. Nasal washings and aspirates are unacceptable for Xpert Xpress SARS-CoV-2/FLU/RSV testing.  Fact Sheet for Patients: BloggerCourse.com  Fact Sheet for Healthcare Providers: SeriousBroker.it  This test is not yet approved or cleared by the [place] FDA and has been authorized for detection and/or diagnosis of SARS-CoV-2 by FDA under an Emergency Use Authorization (EUA). This EUA will remain in effect (meaning this test can be used) for the duration of the COVID-19 declaration under Section 564(b)(1) of the Act, 21 U.S.C. section 360bbb-3(b)(1), unless the authorization is terminated or revoked.     Resp Syncytial Virus by PCR NEGATIVE NEGATIVE Final    Comment: (NOTE) Fact Sheet for  Patients: BloggerCourse.com  Fact Sheet for Healthcare Providers: SeriousBroker.it  This test is not yet approved or cleared by the [place] FDA and has been authorized for detection and/or diagnosis of SARS-CoV-2 by FDA under an Emergency Use Authorization (EUA). This EUA will remain in effect (meaning this test can be used) for the duration of the COVID-19 declaration under Section 564(b)(1) of the Act, 21 U.S.C. section 360bbb-3(b)(1), unless the authorization is terminated or revoked.  Performed at [facility], [address] [place], [place] [address]   C Difficile Quick Screen w PCR reflex     Status: None   Collection Time: 04/10/23  9:31 AM   Specimen:  Stool  Result Value Ref Range Status   C Diff antigen NEGATIVE NEGATIVE Final   C Diff toxin NEGATIVE NEGATIVE Final   C Diff interpretation No C. difficile detected.  Final    Comment: Performed at [facility], [address], [place], [place] [address]     Radiology Studies: US RENAL Result Date: 04/11/2023 CLINICAL DATA:  Acute kidney injury EXAM: RENAL / URINARY TRACT ULTRASOUND COMPLETE COMPARISON:  CT chest abdomen and pelvis 03/08/2022 FINDINGS: Right Kidney: Renal measurements: 8.1 x 4.6 x 3.9 cm = volume: 76 mL. Echogenicity is increased. No mass or hydronephrosis visualized. Left Kidney: Renal measurements: 9.4 x 5.2 x 3.3 cm = volume: 85 mL. Echogenicity is increased. No mass or hydronephrosis visualized. Bladder: Appears normal for degree of bladder distention. Other: None. IMPRESSION: 1. No hydronephrosis. 2. Increased renal echogenicity bilaterally consistent with medical renal disease. Electronically Signed   By: Darliss Cheney M.D.   On: 04/11/2023 01:05   DG Chest 2 View Result Date: 04/09/2023 CLINICAL DATA:  Cough.  Fatigue. EXAM: CHEST - 2 VIEW COMPARISON:  Radiograph 03/30/2023, CT 03/08/2022 FINDINGS: Mild  cardiomegaly. Small left pleural effusion, similar to prior with otherwise improved left basilar aeration. Peribronchial thickening as well as subtle ill-defined pulmonary opacity primarily in the right mid and upper lung zones. No pneumothorax. IMPRESSION: 1. Peribronchial thickening as well as subtle ill-defined pulmonary opacity primarily in the right mid and upper lung zones. This may be infectious or asymmetric edema. 2. Small left pleural effusion. Left basilar aeration has otherwise improved since earlier this month. 3. Mild cardiomegaly. Electronically Signed   By: Narda Rutherford M.D.   On: 04/09/2023 19:56    Scheduled Meds:  febuxostat  40 mg Oral Daily   [START ON 04/12/2023] ferrous sulfate  325 mg Oral Q breakfast   gabapentin  100 mg Oral QHS   levothyroxine  100 mcg Oral Q0600   nebivolol  20 mg Oral BID   predniSONE  20 mg Oral Q breakfast   rosuvastatin  5 mg Oral Daily   Continuous Infusions:   LOS: 2 days   Hughie Closs, MD [facility]  04/11/2023, 8:33 AM   *Please note that this is a verbal dictation therefore any spelling or grammatical errors are due to the "Dragon Medical One" system interpretation.  Please page via Amion and do not message via secure chat for urgent patient care matters. Secure chat can be used for non urgent patient care matters.  How to contact the [facility] Attending or Consulting provider 7A - 7P or covering provider during after hours 7P -7A, for this patient?  Check the care team in [facility] and look for a) attending/consulting [facility] provider listed and b) the [facility] team listed. Page or secure chat 7A-7P. Log into [email or web address] and use [facility] universal password to access. If you do not have the password, please contact the hospital operator. Locate the [facility] provider you are looking for under [facility] and page to a number that you can be directly reached. If you still have difficulty reaching the provider, please page the [facility]  (Director on Call) for the Hospitalists listed on amion for assistance.

## 2023-04-11 NOTE — Telephone Encounter (Signed)
Office visit notes will be faxed over to [facility] [phone].

## 2023-04-12 ENCOUNTER — Inpatient Hospital Stay (HOSPITAL_COMMUNITY): Payer: Medicare Other

## 2023-04-12 DIAGNOSIS — D649 Anemia, unspecified: Secondary | ICD-10-CM | POA: Diagnosis not present

## 2023-04-12 LAB — CBC WITH DIFFERENTIAL/PLATELET
Abs Immature Granulocytes: 0.12 10*3/uL — ABNORMAL HIGH (ref 0.00–0.07)
Basophils Absolute: 0 10*3/uL (ref 0.0–0.1)
Basophils Relative: 0 %
Eosinophils Absolute: 0 10*3/uL (ref 0.0–0.5)
Eosinophils Relative: 0 %
HCT: 21.1 % — ABNORMAL LOW (ref 36.0–46.0)
Hemoglobin: 6.7 g/dL — CL (ref 12.0–15.0)
Immature Granulocytes: 2 %
Lymphocytes Relative: 14 %
Lymphs Abs: 0.7 10*3/uL (ref 0.7–4.0)
MCH: 28.3 pg (ref 26.0–34.0)
MCHC: 31.8 g/dL (ref 30.0–36.0)
MCV: 89 fL (ref 80.0–100.0)
Monocytes Absolute: 0.5 10*3/uL (ref 0.1–1.0)
Monocytes Relative: 10 %
Neutro Abs: 3.8 10*3/uL (ref 1.7–7.7)
Neutrophils Relative %: 74 %
Platelets: 173 10*3/uL (ref 150–400)
RBC: 2.37 MIL/uL — ABNORMAL LOW (ref 3.87–5.11)
RDW: 15.4 % (ref 11.5–15.5)
WBC: 5.1 10*3/uL (ref 4.0–10.5)
nRBC: 0 % (ref 0.0–0.2)

## 2023-04-12 LAB — GLOMERULAR BASEMENT MEMBRANE ANTIBODIES: GBM Ab: <0.2 U (ref 0.0–0.9)

## 2023-04-12 LAB — BASIC METABOLIC PANEL
Anion gap: 9 (ref 5–15)
BUN: 53 mg/dL — ABNORMAL HIGH (ref 8–23)
CO2: 22 mmol/L (ref 22–32)
Calcium: 7.7 mg/dL — ABNORMAL LOW (ref 8.9–10.3)
Chloride: 105 mmol/L (ref 98–111)
Creatinine, Ser: 2.95 mg/dL — ABNORMAL HIGH (ref 0.44–1.00)
GFR, Estimated: 16 mL/min — ABNORMAL LOW (ref >60)
Glucose, Bld: 97 mg/dL (ref 70–99)
Potassium: 4.4 mmol/L (ref 3.5–5.1)
Sodium: 136 mmol/L (ref 135–145)

## 2023-04-12 LAB — PROTEIN / CREATININE RATIO, URINE
Creatinine, Urine: 58 mg/dL
Protein Creatinine Ratio: 0.45 mg/mg{creat} — ABNORMAL HIGH (ref 0.00–0.15)
Total Protein, Urine: 26 mg/dL

## 2023-04-12 LAB — ANTISTREPTOLYSIN O TITER: ASO: 66 [IU]/mL (ref 0.0–200.0)

## 2023-04-12 LAB — KAPPA/LAMBDA LIGHT CHAINS
Kappa free light chain: 100.7 mg/L — ABNORMAL HIGH (ref 3.3–19.4)
Kappa, lambda light chain ratio: 1 (ref 0.26–1.65)
Lambda free light chains: 100.6 mg/L — ABNORMAL HIGH (ref 5.7–26.3)

## 2023-04-12 LAB — HEMOGLOBIN AND HEMATOCRIT, BLOOD
HCT: 27.2 % — ABNORMAL LOW (ref 36.0–46.0)
Hemoglobin: 8.7 g/dL — ABNORMAL LOW (ref 12.0–15.0)

## 2023-04-12 LAB — C4 COMPLEMENT: Complement C4, Body Fluid: 24 mg/dL (ref 12–38)

## 2023-04-12 LAB — PREPARE RBC (CROSSMATCH)

## 2023-04-12 LAB — C3 COMPLEMENT: C3 Complement: 136 mg/dL (ref 82–167)

## 2023-04-12 MED ORDER — B COMPLEX-C PO TABS
1.0000 | ORAL_TABLET | Freq: Every day | ORAL | Status: DC
  Administered 2023-04-12 – 2023-04-14 (×3): 1 via ORAL
  Filled 2023-04-12 (×3): qty 1

## 2023-04-12 MED ORDER — SODIUM CHLORIDE 0.9% IV SOLUTION: Freq: Once | INTRAVENOUS | Status: DC

## 2023-04-12 MED ORDER — IRON SUCROSE 500 MG IVPB - SIMPLE MED
500.0000 mg | Freq: Once | INTRAVENOUS | Status: AC
  Administered 2023-04-13: 500 mg via INTRAVENOUS
  Filled 2023-04-12: qty 500

## 2023-04-12 MED ORDER — DIPHENHYDRAMINE HCL 50 MG/ML IJ SOLN
12.5000 mg | Freq: Once | INTRAMUSCULAR | Status: AC
  Administered 2023-04-12: 12.5 mg via INTRAVENOUS
  Filled 2023-04-12: qty 1

## 2023-04-12 MED ORDER — SODIUM CHLORIDE 0.9 % IV BOLUS: 1000.0000 mL | Freq: Once | INTRAVENOUS | Status: DC

## 2023-04-12 MED ORDER — SODIUM CHLORIDE 0.9 % IV BOLUS
500.0000 mL | Freq: Once | INTRAVENOUS | Status: AC
  Administered 2023-04-12: 500 mL via INTRAVENOUS

## 2023-04-12 MED ORDER — SODIUM CHLORIDE 0.9% IV SOLUTION: Freq: Once | INTRAVENOUS | Status: AC

## 2023-04-12 NOTE — Progress Notes (Signed)
PROGRESS NOTE    Lynn Joseph  KGM:[id number] DOB: [birth year] DOA: 04/09/2023 PCP: Alysia Penna, MD   Brief Narrative:  Lynn Joseph is a 77 y.o. female with medical history significant for atrial fibrillation on Eliquis, chronic anemia, hypertension, obesity obesity, obstructive sleep apnea on CPAP nightly, and hypothyroidism who presents with severe fatigue, no energy, and just not feeling well.  This is how she felt when she was admitted to the hospital 10 days ago.  During that hospital stay she received 2 units of packed red blood cells but reports that she really did not feel great even at the time of discharge.  She has been making it and had a day or 2 since discharge where she felt okay but again today she just had no energy so her daughter brought her in for evaluation.  She is not felt at baseline and at least a month.  Does get B12 injections. She denies any bright red blood per rectum or vomiting blood or hematuria or seeing any blood loss at all. She has been having trouble with numbness and tingling in her hands and feet which is very uncomfortable.  She does have some pain in her hands particularly at night.  A month ago she was completely independent driving ambulating etc.  Now she is having difficulty walking and using a cane. No fevers or chills.  The patient will be admitted to the hospitalist service for transfusion and further workup.  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  Hemoglobin dropped to 7 this admission requiring 1 unit of PRBC transfusion.  Hemoglobin 8 posttransfusion.  FOBT positive.  Per patient, she has had multiple endoscopies in the past but it does not  look like she has actually had one since she has been on Eliquis.  Concern for hematological malignancy, previous hospitalist had consulted oncology/Dr. Arbutus Ped and consulted GI Dr. Lavonia Drafts.  Patient was seen by Dr. Ewing Schlein 04/11/2023, recommended outpatient endoscopy and colonoscopy and recommended CT abdomen pelvis with only oral contrast.  Hemoglobin dropped to 6.7 today, 1 unit of PRBC transfusion is ordered.  Patient is still waiting for oncology to see.  I sent a message and then personally discussed with Dr. Arbutus Ped whose respiratory status going to see patient.  Continue oral iron supplement.  Paroxysmal atrial fibrillation: In sinus rhythm.  Eliquis on hold.  Continue Bystolic.  AKI on CKD stage IIIb: Baseline creatinine around 1.6-1.7, was recently discharged with 1.9.  Presented with 3.4 and since it did not improve, nephrology consulted, started on IV fluids, creatinine improved somewhat and down to 2.95 today.  Avoid nephrotoxic agents.    Obstructive sleep apnea: CPAP.  Fatigue/joint pains/numbness and tingling in hands and feet: Apparently she was on short course of steroids at some point in time and she felt better.  ESR and CRP elevated, ANA pending.  RF negative.  Protein electrophoresis pending.  She has been started on prednisone empirically by previous hospitalist and she wants this to continue so she can feel better for the Christmas at least.  Will need to see rheumatology as outpatient for further workup.  Acquired hypothyroidism: Continue Synthroid.  Hyperlipidemia: Continue Crestor.  Addendum/update 3:14 PM: Received a message from the nurse that  patient was feeling short of breath and nurse was wondering if IV fluids should be adjusted.  Saw patient at bedside.  She was complaining of minimal shortness of breath.  She is not hypoxic.  She is very comfortable.  Lungs clear to auscultation.  Chest x-ray ordered.  Nurse reached out to nephrology, they are adjusting fluids.  Also  family is wanting EGD and colonoscopy while inpatient.  Dr. Ewing Schlein has been informed.  Per RN, Dr. Ewing Schlein said that GI team will not see patient today but see patient tomorrow and plan accordingly.  RN informing patient.  DVT prophylaxis: Place and maintain sequential compression device Start: 04/10/23 1309   Code Status: Prior  Family Communication:  None present at bedside.  Plan of care discussed with patient in length and he/she verbalized understanding and agreed with it.  Also discussed with her daughter over the video call in her room.  Status is: Inpatient Remains inpatient appropriate because: Elevated creatinine, which is improving and she is requiring blood transfusion.   Estimated body mass index is 35.77 kg/m as calculated from the following:   Height as of this encounter: 5\' 5"  (1.651 m).   Weight as of this encounter: 97.5 kg.    Nutritional Assessment: Body mass index is 35.77 kg/m.Marland Kitchen Seen by dietician.  I agree with the assessment and plan as outlined below: Nutrition Status:        . Skin Assessment: I have examined the patient's skin and I agree with the wound assessment as performed by the wound care RN as outlined below:    Consultants:  Oncology, GI and nephrology  Procedures:  None  Antimicrobials:  Anti-infectives (From admission, onward)    None         Subjective: Patient seen and examined.  Overall she feels better than yesterday.  No specific complaint.  Spoke to her daughter over video call in her room.  Objective: Vitals:   04/12/23 0929 04/12/23 1002 04/12/23 1003 04/12/23 1255  BP: (!) 149/63 (!) 157/68 (!) 157/68 (!) 156/61  Pulse: 69 74 74 65  Resp: 17 17 17 18   Temp: (!) 97.4 F (36.3 C) (!) 97.3 F (36.3 C) (!) 97.3 F (36.3 C) 97.9 F (36.6 C)  TempSrc: Oral  Oral Oral  SpO2: 100% 100% 100% 94%  Weight:      Height:        Intake/Output Summary (Last 24 hours) at 04/12/2023 1513 Last data filed at 04/12/2023  1259 Gross per 24 hour  Intake 2179.2 ml  Output 1950 ml  Net 229.2 ml   Filed Weights   04/09/23 1653 04/11/23 1557 04/12/23 0554  Weight: 98.9 kg 101.5 kg 97.5 kg    Examination:  General exam: Appears calm and comfortable  Respiratory system: Clear to auscultation. Respiratory effort normal. Cardiovascular system: S1 & S2 heard, RRR. No JVD, murmurs, rubs, gallops or clicks. No pedal edema. Gastrointestinal system: Abdomen is nondistended, soft and nontender. No organomegaly or masses felt. Normal bowel sounds heard. Central nervous system: Alert and oriented. No focal neurological deficits. Extremities: Symmetric 5 x 5 power. Skin: No rashes, lesions or ulcers.  Psychiatry: Judgement and insight appear normal. Mood & affect appropriate.   Data Reviewed: I have personally reviewed following labs and imaging studies  CBC: Recent Labs  Lab 04/09/23 1700 04/10/23 0521 04/10/23 1110 04/12/23 0448  WBC 5.0  --  5.5 5.1  NEUTROABS  --   --   --  3.8  HGB 7.0* 7.0*  8.0* 6.7*  HCT 23.0* 22.7* 25.9* 21.1*  MCV 89.1  --  90.9 89.0  PLT 217  --  206 173   Basic Metabolic Panel: Recent Labs  Lab 04/09/23 1700 04/10/23 1110 04/12/23 0448  NA 138 138 136  K 4.0 4.0 4.4  CL 106 107 105  CO2 21* 20* 22  GLUCOSE 132* 85 97  BUN 60* 55* 53*  CREATININE 3.41* 3.37* 2.95*  CALCIUM 8.2* 8.1* 7.7*   GFR: Estimated Creatinine Clearance: 18.5 mL/min (A) (by C-G formula based on SCr of 2.95 mg/dL (H)). Liver Function Tests: Recent Labs  Lab 04/09/23 1700  AST 26  ALT 16  ALKPHOS 81  BILITOT 0.9  PROT 7.1  ALBUMIN 3.3*   No results for input(s): "LIPASE", "AMYLASE" in the last 168 hours. No results for input(s): "AMMONIA" in the last 168 hours. Coagulation Profile: No results for input(s): "INR", "PROTIME" in the last 168 hours. Cardiac Enzymes: Recent Labs  Lab 04/09/23 2104  CKTOTAL 61   BNP (last 3 results) No results for input(s): "PROBNP" in the last 8760  hours. HbA1C: No results for input(s): "HGBA1C" in the last 72 hours. CBG: No results for input(s): "GLUCAP" in the last 168 hours. Lipid Profile: No results for input(s): "CHOL", "HDL", "LDLCALC", "TRIG", "CHOLHDL", "LDLDIRECT" in the last 72 hours. Thyroid Function Tests: Recent Labs    04/11/23 0527  TSH 4.193  FREET4 0.97   Anemia Panel: Recent Labs    04/09/23 2105 04/10/23 1110  VITAMINB12  --  1,336*  FOLATE  --  9.5  FERRITIN 88  --   TIBC 381  --   IRON 55  --   RETICCTPCT  --  3.1   Sepsis Labs: No results for input(s): "PROCALCITON", "LATICACIDVEN" in the last 168 hours.  Recent Results (from the past 240 hours)  Resp panel by RT-PCR (RSV, Flu A&B, Covid) Anterior Nasal Swab     Status: None   Collection Time: 04/09/23  6:31 PM   Specimen: Anterior Nasal Swab  Result Value Ref Range Status   SARS Coronavirus 2 by RT PCR NEGATIVE NEGATIVE Final    Comment: (NOTE) SARS-CoV-2 target nucleic acids are NOT DETECTED.  The SARS-CoV-2 RNA is generally detectable in upper respiratory specimens during the acute phase of infection. The lowest concentration of SARS-CoV-2 viral copies this assay can detect is 138 copies/mL. A negative result does not preclude SARS-Cov-2 infection and should not be used as the sole basis for treatment or other patient management decisions. A negative result may occur with  improper specimen collection/handling, submission of specimen other than nasopharyngeal swab, presence of viral mutation(s) within the areas targeted by this assay, and inadequate number of viral copies(<138 copies/mL). A negative result must be combined with clinical observations, patient history, and epidemiological information. The expected result is Negative.  Fact Sheet for Patients:  BloggerCourse.com  Fact Sheet for Healthcare Providers:  SeriousBroker.it  This test is no t yet approved or cleared by the  [place] FDA and  has been authorized for detection and/or diagnosis of SARS-CoV-2 by FDA under an Emergency Use Authorization (EUA). This EUA will remain  in effect (meaning this test can be used) for the duration of the COVID-19 declaration under Section 564(b)(1) of the Act, 21 U.S.C.section 360bbb-3(b)(1), unless the authorization is terminated  or revoked sooner.       Influenza A by PCR NEGATIVE NEGATIVE Final   Influenza B by PCR NEGATIVE NEGATIVE Final    Comment: (  NOTE) The Xpert Xpress SARS-CoV-2/FLU/RSV plus assay is intended as an aid in the diagnosis of influenza from Nasopharyngeal swab specimens and should not be used as a sole basis for treatment. Nasal washings and aspirates are unacceptable for Xpert Xpress SARS-CoV-2/FLU/RSV testing.  Fact Sheet for Patients: BloggerCourse.com  Fact Sheet for Healthcare Providers: SeriousBroker.it  This test is not yet approved or cleared by the [place] FDA and has been authorized for detection and/or diagnosis of SARS-CoV-2 by FDA under an Emergency Use Authorization (EUA). This EUA will remain in effect (meaning this test can be used) for the duration of the COVID-19 declaration under Section 564(b)(1) of the Act, 21 U.S.C. section 360bbb-3(b)(1), unless the authorization is terminated or revoked.     Resp Syncytial Virus by PCR NEGATIVE NEGATIVE Final    Comment: (NOTE) Fact Sheet for Patients: BloggerCourse.com  Fact Sheet for Healthcare Providers: SeriousBroker.it  This test is not yet approved or cleared by the [place] FDA and has been authorized for detection and/or diagnosis of SARS-CoV-2 by FDA under an Emergency Use Authorization (EUA). This EUA will remain in effect (meaning this test can be used) for the duration of the COVID-19 declaration under Section 564(b)(1) of the Act, 21 U.S.C. section  360bbb-3(b)(1), unless the authorization is terminated or revoked.  Performed at [facility], [address], [place], [place] [address]   C Difficile Quick Screen w PCR reflex     Status: None   Collection Time: 04/10/23  9:31 AM   Specimen: Stool  Result Value Ref Range Status   C Diff antigen NEGATIVE NEGATIVE Final   C Diff toxin NEGATIVE NEGATIVE Final   C Diff interpretation No C. difficile detected.  Final    Comment: Performed at [facility], [address], [place], [place] [address]  Culture, blood (Routine X 2) w Reflex to ID Panel     Status: None (Preliminary result)   Collection Time: 04/10/23  7:06 PM   Specimen: BLOOD  Result Value Ref Range Status   Specimen Description   Final    BLOOD BLOOD RIGHT ARM AEROBIC BOTTLE ONLY Performed at [facility], [address] [place], [place] [address]    Special Requests   Final    BOTTLES DRAWN AEROBIC AND ANAEROBIC Blood Culture results may not be optimal due to an inadequate volume of blood received in culture bottles Performed at [facility], [address], [place], [place] [address]    Culture   Final    NO GROWTH 2 DAYS Performed at [facility], [address] [place], [place] [address]    Report Status PENDING  Incomplete  Culture, blood (Routine X 2) w Reflex to ID Panel     Status: None (Preliminary result)   Collection Time: 04/10/23  7:06 PM   Specimen: BLOOD  Result Value Ref Range Status   Specimen Description   Final    BLOOD BLOOD LEFT ARM Performed at [facility], [address], [place], [place] [address]    Special Requests   Final    BOTTLES DRAWN AEROBIC AND ANAEROBIC Blood Culture results may not be optimal due to an inadequate volume of blood received in culture bottles Performed at [facility], [address], [place], [place] [address]    Culture   Final    NO GROWTH  2 DAYS Performed at [facility], [address] [place], [place]  [address]    Report Status PENDING  Incomplete     Radiology Studies: US RENAL Result Date: 04/11/2023 CLINICAL DATA:  Acute kidney injury EXAM: RENAL / URINARY TRACT ULTRASOUND COMPLETE COMPARISON:  CT chest abdomen and pelvis 03/08/2022 FINDINGS: Right Kidney: Renal measurements: 8.1 x 4.6 x 3.9 cm = volume: 76 mL. Echogenicity is increased. No mass or hydronephrosis visualized. Left Kidney: Renal measurements: 9.4 x 5.2 x 3.3 cm = volume: 85 mL. Echogenicity is increased. No mass or hydronephrosis visualized. Bladder: Appears normal for degree of bladder distention. Other: None. IMPRESSION: 1. No hydronephrosis. 2. Increased renal echogenicity bilaterally consistent with medical renal disease. Electronically Signed   By: Darliss Cheney M.D.   On: 04/11/2023 01:05    Scheduled Meds:  B-complex with vitamin C  1 tablet Oral Daily   febuxostat  40 mg Oral Daily   ferrous sulfate  325 mg Oral Q breakfast   gabapentin  100 mg Oral QHS   levothyroxine  100 mcg Oral Q0600   nebivolol  20 mg Oral BID   predniSONE  20 mg Oral Q breakfast   rosuvastatin  5 mg Oral Daily   Continuous Infusions:  [START ON 04/13/2023] iron sucrose     sodium bicarbonate 150 mEq in dextrose 5 % 1,150 mL infusion Stopped (04/12/23 0935)   sodium chloride       LOS: 3 days   Hughie Closs, MD [facility]  04/12/2023, 3:13 PM   *Please note that this is a verbal dictation therefore any spelling or grammatical errors are due to the "Dragon Medical One" system interpretation.  Please page via Amion and do not message via secure chat for urgent patient care matters. Secure chat can be used for non urgent patient care matters.  How to contact the [facility] Attending or Consulting provider 7A - 7P or covering provider during after hours 7P -7A, for this patient?  Check the care team in [facility] and look for a) attending/consulting [facility] provider  listed and b) the [facility] team listed. Page or secure chat 7A-7P. Log into [email or web address] and use [place]'s universal password to access. If you do not have the password, please contact the hospital operator. Locate the [facility] provider you are looking for under [facility] and page to a number that you can be directly reached. If you still have difficulty reaching the provider, please page the [facility] (Director on Call) for the Hospitalists listed on amion for assistance.

## 2023-04-12 NOTE — Progress Notes (Signed)
   04/12/23 2021  BiPAP/CPAP/SIPAP  BiPAP/CPAP/SIPAP Pt Type Adult (prefers self placement)  BiPAP/CPAP/SIPAP Resmed  Mask Type  (home mask)  FiO2 (%) 21 %  Patient Home Equipment No (mask from home)  Auto Titrate Yes (5-15)  CPAP/SIPAP surface wiped down Yes  BiPAP/CPAP /SiPAP Vitals  Pulse Rate 67  Resp 17  SpO2 95 %  Bilateral Breath Sounds Clear;Diminished  MEWS Score/Color  MEWS Score 0  MEWS Score Color Green

## 2023-04-12 NOTE — Progress Notes (Signed)
Lynn Joseph   DOB:[birth year]   ZO#:[id number]      ASSESSMENT & PLAN:  1.  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  Patient reports longstanding history of iron deficiency anemia. -Hemoglobin 6.7 today, patient is status post 1 unit packed RBC transfused today.  Repeat CBC with differential in AM - Transfuse PRBC for Hgb less than 7.0 - Ordered IV Venofer 200 mg to be given once tomorrow 12/21.  Patient with suboptimal ferritin 88.  Recommend repeat iron levels as outpatient. - Copper level and MMA levels ordered, will follow-up results - Recommend GI follow-up with EGD, colonoscopy and possibly capsule endoscopy.  Patient reports last scope was in 2016. -Recommend hematology outpatient follow-up 1 to 2 weeks postdischarge  2.  A-fib on Eliquis -Recommend hold Eliquis at this time due to blood loss -Recommend cardio eval  3.  Vitamin B deficiency - Status post vitamin B 12 injection during this admission - Ordered vitamin B complex daily  4.  Acute on chronic renal failure - Creatinine in 3 range - Continue gentle hydration - Nephrology eval recommended   Subjective:  Patient seen awake and alert sitting up in bed.  Status post 1 unit packed RBC transfusion.  Discussed current status with patient and daughter who was on the phone.  No acute distress noted at this time.  Objective:  Vitals:   04/12/23 1003 04/12/23 1255  BP: (!) 157/68 (!) 156/61  Pulse: 74 65  Resp: 17 18  Temp: (!) 97.3 F (36.3 C) 97.9 F (36.6 C)  SpO2: 100% 94%     Intake/Output Summary (Last 24 hours) at 04/12/2023 1345 Last data filed at 04/12/2023 1259 Gross per 24 hour  Intake 2179.2 ml  Output 1950 ml  Net 229.2 ml     REVIEW OF SYSTEMS:   Constitutional: Denies fevers, chills or abnormal night sweats Eyes: Denies blurriness of vision, double vision or  watery eyes Ears, nose, mouth, throat, and face: Denies mucositis or sore throat Respiratory: Denies cough, dyspnea or wheezes Cardiovascular: Denies palpitation, chest discomfort or lower extremity swelling Gastrointestinal:  Denies nausea, heartburn or change in bowel habits Skin: Denies abnormal skin rashes Lymphatics: Denies new lymphadenopathy or easy bruising Neurological: Denies numbness, tingling or new weaknesses Behavioral/Psych: Mood is stable, no new changes  All other systems were reviewed with the patient and are negative.  PHYSICAL EXAMINATION: ECOG PERFORMANCE STATUS: 1 - Symptomatic but completely ambulatory  Vitals:   04/12/23 1003 04/12/23 1255  BP: (!) 157/68 (!) 156/61  Pulse: 74 65  Resp: 17 18  Temp: (!) 97.3 F (36.3 C) 97.9 F (36.6 C)  SpO2: 100% 94%   Filed Weights   04/09/23 1653 04/11/23 1557 04/12/23 0554  Weight: 218 lb (98.9 kg) 223 lb 12.8 oz (101.5 kg) 214 lb 15.2 oz (97.5 kg)    GENERAL: alert, no distress and comfortable SKIN: skin color, texture, turgor are normal, no rashes or significant lesions EYES: normal, conjunctiva are pink and non-injected, sclera clear OROPHARYNX: no exudate, no erythema and lips, buccal mucosa, and tongue normal  NECK: supple, thyroid normal size, non-tender, without nodularity LYMPH: no palpable lymphadenopathy in the cervical, axillary or inguinal LUNGS: clear to auscultation and percussion with normal breathing effort HEART: regular rate & rhythm and no murmurs and no lower extremity edema ABDOMEN: abdomen soft, non-tender and normal bowel sounds MUSCULOSKELETAL: no  cyanosis of digits and no clubbing  PSYCH: alert & oriented x 3 with fluent speech NEURO: no focal motor/sensory deficits   All questions were answered. The patient knows to call the clinic with any problems, questions or concerns.   The total time spent in the appointment was 40 minutes encounter with patient including review of chart and  various tests results, discussions about plan of care and coordination of care plan  Dawson Bills, NP 04/12/2023 1:45 PM    Labs Reviewed:  Lab Results  Component Value Date   WBC 5.1 04/12/2023   HGB 6.7 (LL) 04/12/2023   HCT 21.1 (L) 04/12/2023   MCV 89.0 04/12/2023   PLT 173 04/12/2023   Recent Labs    03/30/23 1304 03/31/23 0030 04/09/23 1700 04/10/23 1110 04/12/23 0448  NA 140   < > 138 138 136  K 3.8   < > 4.0 4.0 4.4  CL 103   < > 106 107 105  CO2 24   < > 21* 20* 22  GLUCOSE 144*   < > 132* 85 97  BUN 54*   < > 60* 55* 53*  CREATININE 2.51*   < > 3.41* 3.37* 2.95*  CALCIUM 8.2*   < > 8.2* 8.1* 7.7*  GFRNONAA 19*   < > 13* 14* 16*  PROT 7.1  --  7.1  --   --   ALBUMIN 3.7  --  3.3*  --   --   AST 14*  --  26  --   --   ALT 6  --  16  --   --   ALKPHOS 88  --  81  --   --   BILITOT 0.8  --  0.9  --   --    < > = values in this interval not displayed.    Studies Reviewed:  US RENAL Result Date: 04/11/2023 CLINICAL DATA:  Acute kidney injury EXAM: RENAL / URINARY TRACT ULTRASOUND COMPLETE COMPARISON:  CT chest abdomen and pelvis 03/08/2022 FINDINGS: Right Kidney: Renal measurements: 8.1 x 4.6 x 3.9 cm = volume: 76 mL. Echogenicity is increased. No mass or hydronephrosis visualized. Left Kidney: Renal measurements: 9.4 x 5.2 x 3.3 cm = volume: 85 mL. Echogenicity is increased. No mass or hydronephrosis visualized. Bladder: Appears normal for degree of bladder distention. Other: None. IMPRESSION: 1. No hydronephrosis. 2. Increased renal echogenicity bilaterally consistent with medical renal disease. Electronically Signed   By: Darliss Cheney M.D.   On: 04/11/2023 01:05   DG Chest 2 View Result Date: 04/09/2023 CLINICAL DATA:  Cough.  Fatigue. EXAM: CHEST - 2 VIEW COMPARISON:  Radiograph 03/30/2023, CT 03/08/2022 FINDINGS: Mild cardiomegaly. Small left pleural effusion, similar to prior with otherwise improved left basilar aeration. Peribronchial thickening as well as  subtle ill-defined pulmonary opacity primarily in the right mid and upper lung zones. No pneumothorax. IMPRESSION: 1. Peribronchial thickening as well as subtle ill-defined pulmonary opacity primarily in the right mid and upper lung zones. This may be infectious or asymmetric edema. 2. Small left pleural effusion. Left basilar aeration has otherwise improved since earlier this month. 3. Mild cardiomegaly. Electronically Signed   By: Narda Rutherford M.D.   On: 04/09/2023 19:56   DG Chest 2 View Result Date: 03/30/2023 CLINICAL DATA:  SOB EXAM: CHEST - 2 VIEW COMPARISON:  03/26/2023. FINDINGS: Left base consolidation and effusion stable finding. Mild pulmonary vascular congestion. No pneumothorax. Cardiac silhouette appears prominent. IMPRESSION: Left base consolidation and effusion. Mild  vascular congestion. Electronically Signed   By: Layla Maw M.D.   On: 03/30/2023 15:03   DG Chest 2 View Result Date: 03/26/2023 CLINICAL DATA:  Short of breath for 1 and half weeks. EXAM: CHEST - 2 VIEW COMPARISON:  05/14/2022.  CT, 03/08/2022. FINDINGS: Cardiac silhouette top-normal in size. No mediastinal or hilar masses. No evidence of adenopathy. Left lower lobe opacity obscures portions of the left hemidiaphragm. Remainder of the lungs is clear. Possible small left effusion. No right pleural effusion. No pneumothorax. Skeletal structures are intact. IMPRESSION: 1. Left lower lobe opacity consistent with atelectasis or pneumonia. Electronically Signed   By: Amie Portland M.D.   On: 03/26/2023 10:55

## 2023-04-12 NOTE — Plan of Care (Signed)

## 2023-04-12 NOTE — Progress Notes (Signed)
   04/12/23 0546  Provider Notification  Provider Name/Title Liana Crocker, NP  Date Provider Notified 04/12/23  Time Provider Notified [phone]  Method of Notification Page  Notification Reason Critical Result  Test performed and critical result HBG 6.7  Date Critical Result Received 04/12/23  Time Critical Result Received 0520  Provider response Evaluate remotely  Date of Provider Response 04/12/23

## 2023-04-12 NOTE — Progress Notes (Signed)
    Patient Name: Lynn Joseph           DOB: 04-15-1946  MRN: [id number]      Admission Date: 04/09/2023  Attending Provider: Hughie Closs, MD  Primary Diagnosis: Symptomatic anemia   Level of care: Telemetry    CROSS COVER NOTE   Date of Service   04/12/2023   Lynn Joseph, 77 y.o. female, was admitted on 04/09/2023 for Symptomatic anemia.    HPI/Events of Note   Recurrent symptomatic/acute blood loss anemia secondary to presumed upper GI bleed Hemoglobin 8.0 -->  6.7.  Eliquis on hold No acute changes reported.  Hemodynamically stable. FOBT positive.  No melena, hematochezia, or other bleeding reported tonight.    Interventions/ Plan   Blood transfusion, 1 unit PRBC Recheck H&H after transfusion is completed, transfuse if HGB <7        Anthoney Harada, DNP, [employer] [facility]

## 2023-04-12 NOTE — Progress Notes (Addendum)
PROGRESS NOTE    Lynn Joseph  HQI:[id number] DOB: 1945/07/15 DOA: 04/09/2023 PCP: Alysia Penna, MD   Brief Narrative:  Lynn Joseph is a 77 y.o. female with medical history significant for atrial fibrillation on Eliquis, chronic anemia, hypertension, obesity obesity, obstructive sleep apnea on CPAP nightly, and hypothyroidism who presents with severe fatigue, no energy, and just not feeling well.  This is how she felt when she was admitted to the hospital 10 days ago.  During that hospital stay she received 2 units of packed red blood cells but reports that she really did not feel great even at the time of discharge.  She has been making it and had a day or 2 since discharge where she felt okay but again today she just had no energy so her daughter brought her in for evaluation.  She is not felt at baseline and at least a month.  Does get B12 injections. She denies any bright red blood per rectum or vomiting blood or hematuria or seeing any blood loss at all. She has been having trouble with numbness and tingling in her hands and feet which is very uncomfortable.  She does have some pain in her hands particularly at night.  A month ago she was completely independent driving ambulating etc.  Now she is having difficulty walking and using a cane. No fevers or chills.  The patient will be admitted to the hospitalist service for transfusion and further workup.  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  Hemoglobin dropped to 7 this admission requiring 1 unit of PRBC transfusion.  Hemoglobin 8 posttransfusion.  FOBT positive.  Per patient, she has had multiple endoscopies in the past but it does not  look like she has actually had one since she has been on Eliquis.  Concern for hematological malignancy, previous hospitalist had consulted oncology/Dr. Arbutus Ped and consulted GI Dr. Lavonia Drafts.  Patient was seen by Dr. Ewing Schlein 04/11/2023, recommended outpatient endoscopy and colonoscopy and recommended CT abdomen pelvis with only oral contrast.  Hemoglobin dropped to 6.7 today, 1 unit of PRBC transfusion is ordered.  Patient is still waiting for oncology to see.  I sent a message and then personally discussed with Dr. Arbutus Ped whose respiratory status going to see patient.  Continue oral iron supplement.  Paroxysmal atrial fibrillation: In sinus rhythm.  Eliquis on hold.  Continue Bystolic.  AKI on CKD stage IIIb: Baseline creatinine around 1.6-1.7, was recently discharged with 1.9.  Presented with 3.4 and since it did not improve, nephrology consulted, started on IV fluids, creatinine improved somewhat and down to 2.95 today.  Avoid nephrotoxic agents.    Obstructive sleep apnea: CPAP.  Fatigue/joint pains/numbness and tingling in hands and feet: Apparently she was on short course of steroids at some point in time and she felt better.  ESR and CRP elevated, ANA pending.  RF negative.  Protein electrophoresis pending.  She has been started on prednisone empirically by previous hospitalist and she wants this to continue so she can feel better for the Christmas at least.  Will need to see rheumatology as outpatient for further workup.  Acquired hypothyroidism: Continue Synthroid.  Hyperlipidemia: Continue Crestor.  DVT prophylaxis: Place and maintain sequential compression device Start: 04/10/23  1309   Code Status: Prior  Family Communication:  None present at bedside.  Plan of care discussed with patient in length and he/she verbalized understanding and agreed with it.  Also discussed with her daughter over the video call in her room.  Status is: Inpatient Remains inpatient appropriate because: Elevated  creatinine, which is improving and she is requiring blood transfusion.   Estimated body mass index is 35.77 kg/m as calculated from the following:   Height as of this encounter: 5\' 5"  (1.651 m).   Weight as of this encounter: 97.5 kg.    Nutritional Assessment: Body mass index is 35.77 kg/m.Marland Kitchen Seen by dietician.  I agree with the assessment and plan as outlined below: Nutrition Status:        . Skin Assessment: I have examined the patient's skin and I agree with the wound assessment as performed by the wound care RN as outlined below:    Consultants:  Oncology, GI and nephrology  Procedures:  None  Antimicrobials:  Anti-infectives (From admission, onward)    None         Subjective: Patient seen and examined.  Overall she feels better than yesterday.  No specific complaint.  Spoke to her daughter over video call in her room.  Objective: Vitals:   04/11/23 2022 04/11/23 2031 04/12/23 0545 04/12/23 0554  BP:  (!) 142/67 (!) 148/57   Pulse: 67 64 60   Resp: 15 18 18    Temp:  98.8 F (37.1 C) 98.5 F (36.9 C)   TempSrc:  Oral Oral   SpO2: 96% 95% 96%   Weight:    97.5 kg  Height:        Intake/Output Summary (Last 24 hours) at 04/12/2023 0814 Last data filed at 04/12/2023 0600 Gross per 24 hour  Intake 2081.2 ml  Output 2300 ml  Net -218.8 ml   Filed Weights   04/09/23 1653 04/11/23 1557 04/12/23 0554  Weight: 98.9 kg 101.5 kg 97.5 kg    Examination:  General exam: Appears calm and comfortable  Respiratory system: Clear to auscultation. Respiratory effort normal. Cardiovascular system: S1 & S2 heard, RRR. No JVD, murmurs, rubs, gallops or clicks. No pedal edema. Gastrointestinal system: Abdomen is nondistended, soft and nontender. No organomegaly or masses felt. Normal bowel sounds heard. Central nervous system: Alert and oriented. No focal neurological deficits. Extremities: Symmetric 5 x 5 power. Skin: No rashes, lesions or ulcers.   Psychiatry: Judgement and insight appear normal. Mood & affect appropriate.   Data Reviewed: I have personally reviewed following labs and imaging studies  CBC: Recent Labs  Lab 04/09/23 1700 04/10/23 0521 04/10/23 1110 04/12/23 0448  WBC 5.0  --  5.5 5.1  NEUTROABS  --   --   --  3.8  HGB 7.0* 7.0* 8.0* 6.7*  HCT 23.0* 22.7* 25.9* 21.1*  MCV 89.1  --  90.9 89.0  PLT 217  --  206 173   Basic Metabolic Panel: Recent Labs  Lab 04/09/23 1700 04/10/23 1110 04/12/23 0448  NA 138 138 136  K 4.0 4.0 4.4  CL 106 107 105  CO2 21* 20* 22  GLUCOSE 132* 85 97  BUN 60* 55* 53*  CREATININE 3.41* 3.37* 2.95*  CALCIUM 8.2* 8.1* 7.7*   GFR: Estimated Creatinine Clearance: 18.5 mL/min (A) (by C-G formula based on SCr of 2.95 mg/dL (H)). Liver Function Tests: Recent Labs  Lab 04/09/23 1700  AST 26  ALT 16  ALKPHOS 81  BILITOT 0.9  PROT  7.1  ALBUMIN 3.3*   No results for input(s): "LIPASE", "AMYLASE" in the last 168 hours. No results for input(s): "AMMONIA" in the last 168 hours. Coagulation Profile: No results for input(s): "INR", "PROTIME" in the last 168 hours. Cardiac Enzymes: Recent Labs  Lab 04/09/23 2104  CKTOTAL 61   BNP (last 3 results) No results for input(s): "PROBNP" in the last 8760 hours. HbA1C: No results for input(s): "HGBA1C" in the last 72 hours. CBG: No results for input(s): "GLUCAP" in the last 168 hours. Lipid Profile: No results for input(s): "CHOL", "HDL", "LDLCALC", "TRIG", "CHOLHDL", "LDLDIRECT" in the last 72 hours. Thyroid Function Tests: Recent Labs    04/11/23 0527  TSH 4.193  FREET4 0.97   Anemia Panel: Recent Labs    04/09/23 2105 04/10/23 1110  VITAMINB12  --  1,336*  FOLATE  --  9.5  FERRITIN 88  --   TIBC 381  --   IRON 55  --   RETICCTPCT  --  3.1   Sepsis Labs: No results for input(s): "PROCALCITON", "LATICACIDVEN" in the last 168 hours.  Recent Results (from the past 240 hours)  Resp panel by RT-PCR (RSV, Flu  A&B, Covid) Anterior Nasal Swab     Status: None   Collection Time: 04/09/23  6:31 PM   Specimen: Anterior Nasal Swab  Result Value Ref Range Status   SARS Coronavirus 2 by RT PCR NEGATIVE NEGATIVE Final    Comment: (NOTE) SARS-CoV-2 target nucleic acids are NOT DETECTED.  The SARS-CoV-2 RNA is generally detectable in upper respiratory specimens during the acute phase of infection. The lowest concentration of SARS-CoV-2 viral copies this assay can detect is 138 copies/mL. A negative result does not preclude SARS-Cov-2 infection and should not be used as the sole basis for treatment or other patient management decisions. A negative result may occur with  improper specimen collection/handling, submission of specimen other than nasopharyngeal swab, presence of viral mutation(s) within the areas targeted by this assay, and inadequate number of viral copies(<138 copies/mL). A negative result must be combined with clinical observations, patient history, and epidemiological information. The expected result is Negative.  Fact Sheet for Patients:  BloggerCourse.com  Fact Sheet for Healthcare Providers:  SeriousBroker.it  This test is no t yet approved or cleared by the [place] FDA and  has been authorized for detection and/or diagnosis of SARS-CoV-2 by FDA under an Emergency Use Authorization (EUA). This EUA will remain  in effect (meaning this test can be used) for the duration of the COVID-19 declaration under Section 564(b)(1) of the Act, 21 U.S.C.section 360bbb-3(b)(1), unless the authorization is terminated  or revoked sooner.       Influenza A by PCR NEGATIVE NEGATIVE Final   Influenza B by PCR NEGATIVE NEGATIVE Final    Comment: (NOTE) The Xpert Xpress SARS-CoV-2/FLU/RSV plus assay is intended as an aid in the diagnosis of influenza from Nasopharyngeal swab specimens and should not be used as a sole basis for treatment.  Nasal washings and aspirates are unacceptable for Xpert Xpress SARS-CoV-2/FLU/RSV testing.  Fact Sheet for Patients: BloggerCourse.com  Fact Sheet for Healthcare Providers: SeriousBroker.it  This test is not yet approved or cleared by the [place] FDA and has been authorized for detection and/or diagnosis of SARS-CoV-2 by FDA under an Emergency Use Authorization (EUA). This EUA will remain in effect (meaning this test can be used) for the duration of the COVID-19 declaration under Section 564(b)(1) of the Act, 21 U.S.C. section 360bbb-3(b)(1), unless the authorization is  terminated or revoked.     Resp Syncytial Virus by PCR NEGATIVE NEGATIVE Final    Comment: (NOTE) Fact Sheet for Patients: BloggerCourse.com  Fact Sheet for Healthcare Providers: SeriousBroker.it  This test is not yet approved or cleared by the [place] FDA and has been authorized for detection and/or diagnosis of SARS-CoV-2 by FDA under an Emergency Use Authorization (EUA). This EUA will remain in effect (meaning this test can be used) for the duration of the COVID-19 declaration under Section 564(b)(1) of the Act, 21 U.S.C. section 360bbb-3(b)(1), unless the authorization is terminated or revoked.  Performed at [facility], [address], [place], [place] [address]   C Difficile Quick Screen w PCR reflex     Status: None   Collection Time: 04/10/23  9:31 AM   Specimen: Stool  Result Value Ref Range Status   C Diff antigen NEGATIVE NEGATIVE Final   C Diff toxin NEGATIVE NEGATIVE Final   C Diff interpretation No C. difficile detected.  Final    Comment: Performed at [facility], [address] [place], [place] [address]  Culture, blood (Routine X 2) w Reflex to ID Panel     Status: None (Preliminary result)   Collection Time: 04/10/23  7:06 PM   Specimen:  BLOOD  Result Value Ref Range Status   Specimen Description   Final    BLOOD BLOOD RIGHT ARM AEROBIC BOTTLE ONLY Performed at [facility], [address] [place], [place] [address]    Special Requests   Final    BOTTLES DRAWN AEROBIC AND ANAEROBIC Blood Culture results may not be optimal due to an inadequate volume of blood received in culture bottles Performed at [facility], [address] [place], [place] [address]    Culture   Final    NO GROWTH < 12 HOURS Performed at [facility], [address], [place], [place] [address]    Report Status PENDING  Incomplete  Culture, blood (Routine X 2) w Reflex to ID Panel     Status: None (Preliminary result)   Collection Time: 04/10/23  7:06 PM   Specimen: BLOOD  Result Value Ref Range Status   Specimen Description   Final    BLOOD BLOOD LEFT ARM Performed at [facility], [address] [place], [place] [address]    Special Requests   Final    BOTTLES DRAWN AEROBIC AND ANAEROBIC Blood Culture results may not be optimal due to an inadequate volume of blood received in culture bottles Performed at [facility], [address] [place], [place] [address]    Culture   Final    NO GROWTH < 12 HOURS Performed at [facility], [address] [place], [place] [address]    Report Status PENDING  Incomplete     Radiology Studies: US RENAL Result Date: 04/11/2023 CLINICAL DATA:  Acute kidney injury EXAM: RENAL / URINARY TRACT ULTRASOUND COMPLETE COMPARISON:  CT chest abdomen and pelvis 03/08/2022 FINDINGS: Right Kidney: Renal measurements: 8.1 x 4.6 x 3.9 cm = volume: 76 mL. Echogenicity is increased. No mass or hydronephrosis visualized. Left Kidney: Renal measurements: 9.4 x 5.2 x 3.3 cm = volume: 85 mL. Echogenicity is increased. No mass or hydronephrosis visualized. Bladder: Appears normal for degree of bladder distention. Other: None. IMPRESSION:  1. No hydronephrosis. 2. Increased renal echogenicity bilaterally consistent with medical renal disease. Electronically Signed   By: Mcneil Sober.D.  On: 04/11/2023 01:05    Scheduled Meds:  sodium chloride   Intravenous Once   febuxostat  40 mg Oral Daily   ferrous sulfate  325 mg Oral Q breakfast   gabapentin  100 mg Oral QHS   levothyroxine  100 mcg Oral Q0600   nebivolol  20 mg Oral BID   predniSONE  20 mg Oral Q breakfast   rosuvastatin  5 mg Oral Daily   Continuous Infusions:  sodium bicarbonate 150 mEq in dextrose 5 % 1,150 mL infusion 75 mL/hr at 04/11/23 2224     LOS: 3 days   Hughie Closs, MD [facility]  04/12/2023, 8:14 AM   *Please note that this is a verbal dictation therefore any spelling or grammatical errors are due to the "Dragon Medical One" system interpretation.  Please page via Amion and do not message via secure chat for urgent patient care matters. Secure chat can be used for non urgent patient care matters.  How to contact the [facility] Attending or Consulting provider 7A - 7P or covering provider during after hours 7P -7A, for this patient?  Check the care team in [facility] and look for a) attending/consulting [facility] provider listed and b) the [facility] team listed. Page or secure chat 7A-7P. Log into [email or web address] and use 's universal password to access. If you do not have the password, please contact the hospital operator. Locate the [facility] provider you are looking for under [facility] and page to a number that you can be directly reached. If you still have difficulty reaching the provider, please page the [facility] (Director on Call) for the Hospitalists listed on amion for assistance.

## 2023-04-12 NOTE — Progress Notes (Signed)
[facility] Progress Note  Subjective: UOP 2000 yesterday, I/O net neg 40 cc. Creat down 2.9 today.   Vitals:   04/12/23 0929 04/12/23 1002 04/12/23 1003 04/12/23 1255  BP: (!) 149/63 (!) 157/68 (!) 157/68 (!) 156/61  Pulse: 69 74 74 65  Resp: 17 17 17 18   Temp: (!) 97.4 F (36.3 C) (!) 97.3 F (36.3 C) (!) 97.3 F (36.3 C) 97.9 F (36.6 C)  TempSrc: Oral  Oral Oral  SpO2: 100% 100% 100% 94%  Weight:      Height:        Exam: Gen alert, no distress, obese, pleasant Sclera anicteric, throat clear  No jvd or bruits Chest clear bilat to bases RRR no MRG Abd soft ntnd no mass or ascites +bs, no bruits Ext no pitting UE or LE edema Neuro is alert, Ox 3 , nf         Renal-related home meds: - eliquis 5 bid - lasix 20mg  every day prn for wt gain - gabapentin 100 hs - hydralazine 100 tid - bystolic 20mg  bid - others: statin, uloric, colchicine, vits/ supps      Date                            Creat               eGFR (ml/min)  2020                           1.38- 1.72        29- 38   12/22/21                       1.56                 34 ml/min          03/08/22                     2.71 >> 1.58    18 >> 34 ml/min  06/05/22                       1.65                 32 ml/min                      12/07- 03/31/23          2.51 >> 1.99    19 >> 25 ml/min  04/09/23                     3.41                 13 ml/min                       04/10/23                    3.37                 14 ml/min      Apr 06, 2022 - Dr Elby Showers did biliary drain injection and removal    Jun 20, 2022 - Dr Kirke Corin did bilat renal angiography showing "no sig renal artery stenosis". Normal L renal artery and mild nonobstructive disease affecting the R  renal artery.  40cc contrast used.         Renal US - 8.1/ 9.4 cm kidneys w/o hydro, + echo bilat    UA - prot 30, rbc > 50, wbc 5-10, 0-5 epis, no bact    UNa 74,  UCr 62     Hep B, hep C negative    EPO 31.9 ^^ (2- 18)     RF  negative     ANA negative, C3/ C4 wnl.      Fe tsat 14%, ferritin 88     SPEP pending       Assessment/ Plan: AKI on CKD 4 - b/l creatinine 1.65- 1.99 from feb- dec 2024, eGFR 25- 32 ml/min. Creat here is 3.4 in setting of low Hb, and fatigue. Has had sig renal failure since 2020 but has not seen nephrologist. Urine lytes look like advanced CKD. Urine sediment showed 50 rbc/ hpf, 6-10 wbc/hpf, few gran casts, no Rbc /  wbc casts. Dip showed no protein. ANA neg, C3 and C4 wnl. More serologies are pending, also serum FLC. Renal US showing chronic disease, no obstruction. Suspect AKI due primarily to prn lasix pt was taking at home when feeling bad, leading to vol depletion. CKD not sure, prob chronic 30 yrs of HTN. Sediment shows no signs of GN (prot neg, monomorphic rbc's, no suspicious casts). Creat down to 2.9 today. Rebolus again 1 L and cont bicarb at 75 /hr.  Anemia - acute on chronic, getting prbc's. CKD 3/4 could be contributing to anemia. Would look for other causes also.  HTN - long hx 30 yrs Obesity - hx of roux-en-Y in 2009  OSA on CPAP Joint pains / fatigue - sending renal serologies     Vinson Moselle MD  [facility] 04/12/2023, 1:10 PM  Recent Labs  Lab 04/09/23 1700 04/10/23 0521 04/10/23 1110 04/12/23 0448  HGB 7.0*   < > 8.0* 6.7*  ALBUMIN 3.3*  --   --   --   CALCIUM 8.2*  --  8.1* 7.7*  CREATININE 3.41*  --  3.37* 2.95*  K 4.0  --  4.0 4.4   < > = values in this interval not displayed.   Recent Labs  Lab 04/09/23 2105  IRON 55  TIBC 381  FERRITIN 88   Inpatient medications:  febuxostat  40 mg Oral Daily   ferrous sulfate  325 mg Oral Q breakfast   gabapentin  100 mg Oral QHS   levothyroxine  100 mcg Oral Q0600   nebivolol  20 mg Oral BID   predniSONE  20 mg Oral Q breakfast   rosuvastatin  5 mg Oral Daily    sodium bicarbonate 150 mEq in dextrose 5 % 1,150 mL infusion Stopped (04/12/23 0935)   acetaminophen

## 2023-04-13 ENCOUNTER — Inpatient Hospital Stay (HOSPITAL_COMMUNITY): Payer: Medicare Other

## 2023-04-13 DIAGNOSIS — D649 Anemia, unspecified: Secondary | ICD-10-CM | POA: Diagnosis not present

## 2023-04-13 LAB — BASIC METABOLIC PANEL
Anion gap: 11 (ref 5–15)
BUN: 49 mg/dL — ABNORMAL HIGH (ref 8–23)
CO2: 23 mmol/L (ref 22–32)
Calcium: 7.5 mg/dL — ABNORMAL LOW (ref 8.9–10.3)
Chloride: 107 mmol/L (ref 98–111)
Creatinine, Ser: 2.57 mg/dL — ABNORMAL HIGH (ref 0.44–1.00)
GFR, Estimated: 19 mL/min — ABNORMAL LOW (ref >60)
Glucose, Bld: 80 mg/dL (ref 70–99)
Potassium: 3.9 mmol/L (ref 3.5–5.1)
Sodium: 141 mmol/L (ref 135–145)

## 2023-04-13 LAB — CBC WITH DIFFERENTIAL/PLATELET
Abs Immature Granulocytes: 0.09 10*3/uL — ABNORMAL HIGH (ref 0.00–0.07)
Basophils Absolute: 0 10*3/uL (ref 0.0–0.1)
Basophils Relative: 0 %
Eosinophils Absolute: 0 10*3/uL (ref 0.0–0.5)
Eosinophils Relative: 1 %
HCT: 23.9 % — ABNORMAL LOW (ref 36.0–46.0)
Hemoglobin: 7.5 g/dL — ABNORMAL LOW (ref 12.0–15.0)
Immature Granulocytes: 2 %
Lymphocytes Relative: 14 %
Lymphs Abs: 0.7 10*3/uL (ref 0.7–4.0)
MCH: 28.4 pg (ref 26.0–34.0)
MCHC: 31.4 g/dL (ref 30.0–36.0)
MCV: 90.5 fL (ref 80.0–100.0)
Monocytes Absolute: 0.5 10*3/uL (ref 0.1–1.0)
Monocytes Relative: 9 %
Neutro Abs: 3.7 10*3/uL (ref 1.7–7.7)
Neutrophils Relative %: 74 %
Platelets: 169 10*3/uL (ref 150–400)
RBC: 2.64 MIL/uL — ABNORMAL LOW (ref 3.87–5.11)
RDW: 15.5 % (ref 11.5–15.5)
WBC: 4.9 10*3/uL (ref 4.0–10.5)
nRBC: 0 % (ref 0.0–0.2)

## 2023-04-13 MED ORDER — IOHEXOL 300 MG/ML  SOLN
30.0000 mL | Freq: Once | INTRAMUSCULAR | Status: AC | PRN
  Administered 2023-04-13: 30 mL via ORAL

## 2023-04-13 MED ORDER — DARBEPOETIN ALFA 60 MCG/0.3ML IJ SOSY
60.0000 ug | PREFILLED_SYRINGE | Freq: Once | INTRAMUSCULAR | Status: AC
Start: 2023-04-13 — End: 2023-04-13
  Administered 2023-04-13: 60 ug via SUBCUTANEOUS
  Filled 2023-04-13: qty 0.3

## 2023-04-13 NOTE — Progress Notes (Addendum)
[facility] Progress Note  Subjective: creat down to 2.5 today, no new SOB or leg swelling.   Vitals:   04/13/23 0500 04/13/23 0522 04/13/23 0538 04/13/23 1408  BP:  (!) 155/70  (!) 157/64  Pulse:  (!) 56  66  Resp:  16  18  Temp:  98.5 F (36.9 C)  97.7 F (36.5 C)  TempSrc:  Oral  Oral  SpO2:  92%  93%  Weight: 105.3 kg  106.5 kg   Height:        Exam: Gen alert, no distress, obese, pleasant Sclera anicteric, throat clear  No jvd or bruits Chest clear bilat to bases RRR no MRG Abd soft ntnd no mass or ascites +bs, no bruits Ext no pitting UE or LE edema Neuro is alert, Ox 3 , nf         Renal-related home meds: - eliquis 5 bid - lasix 20mg  every day prn for wt gain - gabapentin 100 hs - hydralazine 100 tid - bystolic 20mg  bid - others: statin, uloric, colchicine, vits/ supps      Date                            Creat               eGFR (ml/min)  2020                           1.38- 1.72        29- 38   12/22/21                       1.56                 34 ml/min          03/08/22                     2.71 >> 1.58    18 >> 34 ml/min  06/05/22                       1.65                 32 ml/min                      12/07- 03/31/23          2.51 >> 1.99    19 >> 25 ml/min  04/09/23                     3.41                 13 ml/min                       04/10/23                    3.37                 14 ml/min      Apr 06, 2022 - Dr Elby Showers did biliary drain injection and removal    Jun 20, 2022 - Dr Kirke Corin did bilat renal angiography showing "no sig renal artery stenosis". Normal L renal artery and mild nonobstructive disease affecting the R renal artery.  40cc contrast used.  Renal US - 8.1/ 9.4 cm kidneys w/o hydro, + echo bilat    UA - prot 30, rbc > 50, wbc 5-10, 0-5 epis, no bact    UNa 74,  UCr 62     Hep B, hep C negative    EPO 31.9 ^^ (2- 18)     ANA negative, C3/ C4 wnl, antiGBM wnl, RF and ASO negative      Serum FLC shows K  and L both elevated about the same = negative test      (+ FLC ratio is when one of the LC's (kappa or lambda) is much higher than the other)     Fe tsat 14%, ferritin 88     SPEP and ANCA pending          Assessment/ Plan: AKI on CKD 4 - b/l creatinine 1.65- 1.99 from feb- dec 2024, eGFR 25- 32 ml/min. Creat here is 3.4 in setting of low Hb, and fatigue. Has had sig renal failure since 2020 but has not seen nephrologist. Urine lytes look like advanced CKD. Urine sediment showed 50 rbc/ hpf, 6-10 wbc/hpf, few gran casts, no Rbc /  wbc casts. Dip showed no protein. ANA neg, C3, C4, ASO, RF and serum free LC"s are negative. SPEP and ANCA still pending. Renal US showing chronic disease, no obstruction. Suspect AKI due primarily to prn lasix pt was taking at home when feeling bad, leading to vol depletion. CKD prob chronic 30 yrs of HTN. Sediment shows no signs of GN (prot neg, monomorphic rbc's, no suspicious casts). Creat continues to improve down to 2.5 today. Will dc IVF"s. No further suggestions, our office will contact pt to set up office f/u appt in 2-3 wks. No po lasix for 1 week then resume 20mg  po daily PRN for signs of fluid overload, mostly LE edema. Will sign off.  Anemia - acute on chronic, getting prbc's. CKD could be contributing to anemia. Getting IV iron per hem-onc. Will give 1 dose esa sq today and if needed this can be continued by hematology or nephrology in the OP setting.  HTN - long hx 30 yrs Afib - on eliquis Obesity - hx of roux-en-Y in 2009  OSA on CPAP Joint pains / fatigue      Vinson Moselle MD  [facility] 04/13/2023, 3:40 PM  Recent Labs  Lab 04/09/23 1700 04/10/23 0521 04/12/23 0448 04/12/23 1510 04/13/23 0536  HGB 7.0*   < > 6.7* 8.7* 7.5*  ALBUMIN 3.3*  --   --   --   --   CALCIUM 8.2*   < > 7.7*  --  7.5*  CREATININE 3.41*   < > 2.95*  --  2.57*  K 4.0   < > 4.4  --  3.9   < > = values in this interval not displayed.   Recent Labs  Lab 04/09/23 2105  IRON  55  TIBC 381  FERRITIN 88   Inpatient medications:  B-complex with vitamin C  1 tablet Oral Daily   febuxostat  40 mg Oral Daily   ferrous sulfate  325 mg Oral Q breakfast   gabapentin  100 mg Oral QHS   levothyroxine  100 mcg Oral Q0600   nebivolol  20 mg Oral BID   predniSONE  20 mg Oral Q breakfast   rosuvastatin  5 mg Oral Daily    sodium bicarbonate 150 mEq in dextrose 5 % 1,150 mL infusion 65 mL/hr at 04/12/23 1528   acetaminophen

## 2023-04-13 NOTE — Progress Notes (Signed)
IV found to be leaking. Unable to save IV so it was removed with catheter intact.  Attempted new IV placement unsuccessfully.  IV team consult ordered

## 2023-04-13 NOTE — Progress Notes (Signed)
PROGRESS NOTE    Lynn Joseph  QIO:[id number] DOB: 03-14-1946 DOA: 04/09/2023 PCP: Alysia Penna, MD   Brief Narrative:  Lynn Joseph is a 77 y.o. female with medical history significant for atrial fibrillation on Eliquis, chronic anemia, hypertension, obesity obesity, obstructive sleep apnea on CPAP nightly, and hypothyroidism who presents with severe fatigue, no energy, and just not feeling well.  This is how she felt when she was admitted to the hospital 10 days ago.  During that hospital stay she received 2 units of packed red blood cells but reports that she really did not feel great even at the time of discharge.  She has been making it and had a day or 2 since discharge where she felt okay but again today she just had no energy so her daughter brought her in for evaluation.  She is not felt at baseline and at least a month.  Does get B12 injections. She denies any bright red blood per rectum or vomiting blood or hematuria or seeing any blood loss at all. She has been having trouble with numbness and tingling in her hands and feet which is very uncomfortable.  She does have some pain in her hands particularly at night.  A month ago she was completely independent driving ambulating etc.  Now she is having difficulty walking and using a cane. No fevers or chills.  The patient will be admitted to the hospitalist service for transfusion and further workup.  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  She received 1 unit of transfusion for hemoglobin of 7 on 04/10/2023.  Hemoglobin again dropped to 6.7 on 04/12/2023 requiring 1 unit of blood transfusion.   FOBT positive.  Per patient, she has had  multiple endoscopies in the past but it does not look like she has actually had one since she has been on Eliquis.  Concern for hematological malignancy, was seen by oncology and they recommended outpatient workup but did not highly suspect any hematological malignancy and ordered IV iron on 04/12/2023.  Patient was seen by Dr. Ewing Schlein 04/11/2023, recommended outpatient endoscopy and colonoscopy and recommended CT abdomen pelvis with only oral contrast.  I ordered CT abdomen yesterday with only oral contrast which is still pending.  After talking to oncology/hematology, family decided to have EGD and colonoscopy while inpatient.  This message was conveyed to Dr. Ewing Schlein by the nurse and she was told that somebody from GI will see patient today and schedule interventions.  Kappa/lambda light chains are highly elevated.  Will defer to nephrology to discuss with the patient.  Her hemoglobin dropped again to 7.5, will recheck tomorrow morning.  Paroxysmal atrial fibrillation: In sinus rhythm.  Eliquis on hold.  Continue Bystolic.  AKI on CKD stage IIIb: Baseline creatinine around 1.6-1.7, was recently discharged with 1.9.  Presented with 3.4 and since it did not improve, nephrology consulted, started on IV fluids, creatinine improved somewhat and down to 2.5 today.  Avoid nephrotoxic agents.    Obstructive sleep apnea: CPAP.  Fatigue/joint pains/numbness and tingling in hands and feet: Apparently she was on short course of steroids at some point in time and she felt better.  ESR and CRP elevated, ANA pending.  RF negative.  Protein electrophoresis  pending.  She has been started on prednisone empirically by previous hospitalist and she wants this to continue so she can feel better for the Christmas at least.  Will need to see rheumatology as outpatient for further workup.  Acquired hypothyroidism: Continue Synthroid.  Hyperlipidemia: Continue Crestor.  Dyspnea: She was complaining of some dyspnea yesterday,  chest x-ray obtained which shows vascular congestion, lungs clear to auscultation.  She has no complaints today.  Lungs clear to auscultation again today.  She is receiving bicarb drip.  Will defer to nephrology for that.  She is not hypoxic at the moment.  DVT prophylaxis: Place and maintain sequential compression device Start: 04/10/23 1309   Code Status: Prior  Family Communication:  None present at bedside.  Plan of care discussed with patient in length and he/she verbalized understanding and agreed with it.  Also discussed with her daughter over the video call in her room.  Status is: Inpatient Remains inpatient appropriate because: Elevated creatinine, which is improving and she is requiring blood transfusion.   Estimated body mass index is 39.07 kg/m as calculated from the following:   Height as of this encounter: 5\' 5"  (1.651 m).   Weight as of this encounter: 106.5 kg.    Nutritional Assessment: Body mass index is 39.07 kg/m.Marland Kitchen Seen by dietician.  I agree with the assessment and plan as outlined below: Nutrition Status:        . Skin Assessment: I have examined the patient's skin and I agree with the wound assessment as performed by the wound care RN as outlined below:    Consultants:  Oncology, GI and nephrology  Procedures:  None  Antimicrobials:  Anti-infectives (From admission, onward)    None         Subjective: Patient seen and examined.  Just feels fatigued and weak.  No other specific complaint.  Concerned about elevated light chains.  She is eager to discuss that with nephrology today.  Objective: Vitals:   04/12/23 2201 04/13/23 0500 04/13/23 0522 04/13/23 0538  BP: (!) 154/57  (!) 155/70   Pulse: 66  (!) 56   Resp: 18  16   Temp: 97.8 F (36.6 C)  98.5 F (36.9 C)   TempSrc:   Oral   SpO2: 92%  92%   Weight:  105.3 kg  106.5 kg  Height:        Intake/Output Summary (Last 24 hours) at 04/13/2023 0926 Last data filed at 04/13/2023  0525 Gross per 24 hour  Intake 2359.32 ml  Output 1800 ml  Net 559.32 ml   Filed Weights   04/12/23 0554 04/13/23 0500 04/13/23 0538  Weight: 97.5 kg 105.3 kg 106.5 kg    Examination:  General exam: Appears calm and comfortable, obese Respiratory system: Clear to auscultation. Respiratory effort normal. Cardiovascular system: S1 & S2 heard, RRR. No JVD, murmurs, rubs, gallops or clicks. No pedal edema. Gastrointestinal system: Abdomen is nondistended, soft and nontender. No organomegaly or masses felt. Normal bowel sounds heard. Central nervous system: Alert and oriented. No focal neurological deficits. Extremities: Symmetric 5 x 5 power. Skin: No rashes, lesions or ulcers.  Psychiatry: Judgement and insight appear normal. Mood & affect appropriate.    Data Reviewed: I have personally reviewed following labs and imaging studies  CBC: Recent Labs  Lab 04/09/23 1700 04/10/23 0521 04/10/23 1110 04/12/23 0448 04/12/23 1510 04/13/23 0536  WBC 5.0  --  5.5 5.1  --  4.9  NEUTROABS  --   --   --  3.8  --  3.7  HGB 7.0* 7.0* 8.0* 6.7* 8.7* 7.5*  HCT 23.0* 22.7* 25.9* 21.1* 27.2* 23.9*  MCV 89.1  --  90.9 89.0  --  90.5  PLT 217  --  206 173  --  169   Basic Metabolic Panel: Recent Labs  Lab 04/09/23 1700 04/10/23 1110 04/12/23 0448 04/13/23 0536  NA 138 138 136 141  K 4.0 4.0 4.4 3.9  CL 106 107 105 107  CO2 21* 20* 22 23  GLUCOSE 132* 85 97 80  BUN 60* 55* 53* 49*  CREATININE 3.41* 3.37* 2.95* 2.57*  CALCIUM 8.2* 8.1* 7.7* 7.5*   GFR: Estimated Creatinine Clearance: 22.2 mL/min (A) (by C-G formula based on SCr of 2.57 mg/dL (H)). Liver Function Tests: Recent Labs  Lab 04/09/23 1700  AST 26  ALT 16  ALKPHOS 81  BILITOT 0.9  PROT 7.1  ALBUMIN 3.3*   No results for input(s): "LIPASE", "AMYLASE" in the last 168 hours. No results for input(s): "AMMONIA" in the last 168 hours. Coagulation Profile: No results for input(s): "INR", "PROTIME" in the last 168  hours. Cardiac Enzymes: Recent Labs  Lab 04/09/23 2104  CKTOTAL 61   BNP (last 3 results) No results for input(s): "PROBNP" in the last 8760 hours. HbA1C: No results for input(s): "HGBA1C" in the last 72 hours. CBG: No results for input(s): "GLUCAP" in the last 168 hours. Lipid Profile: No results for input(s): "CHOL", "HDL", "LDLCALC", "TRIG", "CHOLHDL", "LDLDIRECT" in the last 72 hours. Thyroid Function Tests: Recent Labs    04/11/23 0527  TSH 4.193  FREET4 0.97   Anemia Panel: Recent Labs    04/10/23 1110  VITAMINB12 1,336*  FOLATE 9.5  RETICCTPCT 3.1   Sepsis Labs: No results for input(s): "PROCALCITON", "LATICACIDVEN" in the last 168 hours.  Recent Results (from the past 240 hours)  Resp panel by RT-PCR (RSV, Flu A&B, Covid) Anterior Nasal Swab     Status: None   Collection Time: 04/09/23  6:31 PM   Specimen: Anterior Nasal Swab  Result Value Ref Range Status   SARS Coronavirus 2 by RT PCR NEGATIVE NEGATIVE Final    Comment: (NOTE) SARS-CoV-2 target nucleic acids are NOT DETECTED.  The SARS-CoV-2 RNA is generally detectable in upper respiratory specimens during the acute phase of infection. The lowest concentration of SARS-CoV-2 viral copies this assay can detect is 138 copies/mL. A negative result does not preclude SARS-Cov-2 infection and should not be used as the sole basis for treatment or other patient management decisions. A negative result may occur with  improper specimen collection/handling, submission of specimen other than nasopharyngeal swab, presence of viral mutation(s) within the areas targeted by this assay, and inadequate number of viral copies(<138 copies/mL). A negative result must be combined with clinical observations, patient history, and epidemiological information. The expected result is Negative.  Fact Sheet for Patients:  BloggerCourse.com  Fact Sheet for Healthcare Providers:   SeriousBroker.it  This test is no t yet approved or cleared by the [place] FDA and  has been authorized for detection and/or diagnosis of SARS-CoV-2 by FDA under an Emergency Use Authorization (EUA). This EUA will remain  in effect (meaning this test can be used) for the duration of the COVID-19 declaration under Section 564(b)(1) of the Act, 21 U.S.C.section 360bbb-3(b)(1), unless the authorization is terminated  or revoked sooner.       Influenza A by PCR NEGATIVE NEGATIVE Final   Influenza B by PCR NEGATIVE NEGATIVE Final  Comment: (NOTE) The Xpert Xpress SARS-CoV-2/FLU/RSV plus assay is intended as an aid in the diagnosis of influenza from Nasopharyngeal swab specimens and should not be used as a sole basis for treatment. Nasal washings and aspirates are unacceptable for Xpert Xpress SARS-CoV-2/FLU/RSV testing.  Fact Sheet for Patients: BloggerCourse.com  Fact Sheet for Healthcare Providers: SeriousBroker.it  This test is not yet approved or cleared by the [place] FDA and has been authorized for detection and/or diagnosis of SARS-CoV-2 by FDA under an Emergency Use Authorization (EUA). This EUA will remain in effect (meaning this test can be used) for the duration of the COVID-19 declaration under Section 564(b)(1) of the Act, 21 U.S.C. section 360bbb-3(b)(1), unless the authorization is terminated or revoked.     Resp Syncytial Virus by PCR NEGATIVE NEGATIVE Final    Comment: (NOTE) Fact Sheet for Patients: BloggerCourse.com  Fact Sheet for Healthcare Providers: SeriousBroker.it  This test is not yet approved or cleared by the [place] FDA and has been authorized for detection and/or diagnosis of SARS-CoV-2 by FDA under an Emergency Use Authorization (EUA). This EUA will remain in effect (meaning this test can be used) for  the duration of the COVID-19 declaration under Section 564(b)(1) of the Act, 21 U.S.C. section 360bbb-3(b)(1), unless the authorization is terminated or revoked.  Performed at [facility], [address] [place], [place] [address]   C Difficile Quick Screen w PCR reflex     Status: None   Collection Time: 04/10/23  9:31 AM   Specimen: Stool  Result Value Ref Range Status   C Diff antigen NEGATIVE NEGATIVE Final   C Diff toxin NEGATIVE NEGATIVE Final   C Diff interpretation No C. difficile detected.  Final    Comment: Performed at [facility], [address], [place], [place] [address]  Culture, blood (Routine X 2) w Reflex to ID Panel     Status: None (Preliminary result)   Collection Time: 04/10/23  7:06 PM   Specimen: BLOOD  Result Value Ref Range Status   Specimen Description   Final    BLOOD BLOOD RIGHT ARM AEROBIC BOTTLE ONLY Performed at [facility], [address], [place], [place] [address]    Special Requests   Final    BOTTLES DRAWN AEROBIC AND ANAEROBIC Blood Culture results may not be optimal due to an inadequate volume of blood received in culture bottles Performed at [facility], [address], [place], [place] [address]    Culture   Final    NO GROWTH 3 DAYS Performed at [facility], [address], [place], [place] [address]    Report Status PENDING  Incomplete  Culture, blood (Routine X 2) w Reflex to ID Panel     Status: None (Preliminary result)   Collection Time: 04/10/23  7:06 PM   Specimen: BLOOD  Result Value Ref Range Status   Specimen Description   Final    BLOOD BLOOD LEFT ARM Performed at [facility], [address], [place], [place] [address]    Special Requests   Final    BOTTLES DRAWN AEROBIC AND ANAEROBIC Blood Culture results may not be optimal due to an inadequate volume of blood received in culture bottles Performed at [facility], [address], [place], [place] [address]    Culture   Final    NO GROWTH 3 DAYS Performed at [facility], [address], [place],  [place] [address]    Report Status PENDING  Incomplete     Radiology Studies: DG CHEST PORT 1 VIEW Result Date: 04/12/2023 CLINICAL DATA:  Dyspnea EXAM: PORTABLE CHEST 1 VIEW COMPARISON:  04/09/2023, 03/26/2023 FINDINGS: Cardiomegaly with vascular congestion and mild diffuse interstitial opacity suggestive of minimal edema. Possible trace pleural effusions. No consolidation or pneumothorax. IMPRESSION: Cardiomegaly with vascular congestion and mild diffuse interstitial opacity suggestive of minimal edema. Possible trace pleural effusions. Electronically Signed   By: Jasmine Pang M.D.   On: 04/12/2023 19:03    Scheduled Meds:  B-complex with vitamin C  1 tablet Oral Daily   febuxostat  40 mg Oral Daily   ferrous sulfate  325 mg Oral Q breakfast   gabapentin  100 mg Oral QHS   levothyroxine  100 mcg Oral Q0600   nebivolol  20 mg Oral BID   predniSONE  20 mg Oral Q breakfast   rosuvastatin  5 mg Oral Daily   Continuous Infusions:  iron sucrose     sodium bicarbonate 150 mEq in dextrose 5 % 1,150 mL infusion 65 mL/hr at 04/12/23 1528     LOS: 4 days   Hughie Closs, MD [facility]  04/13/2023, 9:26 AM   *Please note that this is a verbal dictation therefore any spelling or grammatical errors are due to the "Dragon Medical One" system interpretation.  Please page via Amion and do not message via secure chat for urgent patient care matters. Secure chat can be used for non urgent patient care matters.  How to contact the [facility] Attending or Consulting provider 7A - 7P or covering provider during after hours 7P -7A, for this patient?  Check the care team in [facility] and look for a) attending/consulting [facility] provider listed and b) the [facility] team listed. Page or secure chat 7A-7P. Log into [email or web address] and use [place]'s  universal password to access. If you do not have the password, please contact the hospital operator. Locate the [facility] provider you are looking for under [facility] and page to a number that you can be directly reached. If you still have difficulty reaching the provider, please page the [facility] (Director on Call) for the Hospitalists listed on amion for assistance.

## 2023-04-13 NOTE — TOC Initial Note (Signed)
Transition of Care [facility]) - Initial/Assessment Note    Patient Details  Name: Lynn Joseph MRN: [id number] Date of Birth: Sep 29, 1945  Transition of Care [facility]) CM/SW Contact:    Adrian Prows, RN Phone Number: 04/13/2023, 2:45 PM  Clinical Narrative:                 [name] w/ pt and family in room; pt says she lives at home; she plans to return at d/c; she identified POC dtr Gerald Leitz [phone]; family will transport home; pt verifies she has PCP/insurance; she denies SDOH risks; pt says she has a walker, and cane; she does not have HH services or home oxygen; pt says she has been "wobbly on her feet"; she would like a PT consult; Dr Jacqulyn Bath notified via secure chat; TOC will follow.  Expected Discharge Plan: Home/Self Care Barriers to Discharge: Continued Medical Work up   Patient Goals and CMS Choice Patient states their goals for this hospitalization and ongoing recovery are:: home CMS Medicare.gov Compare Post Acute Care list provided to:: Patient   [place] ownership interest in [facility].provided to:: Patient    Expected Discharge Plan and Services   Discharge Planning Services: CM Consult   Living arrangements for the past 2 months: Single Family Home                                      Prior Living Arrangements/Services Living arrangements for the past 2 months: Single Family Home Lives with:: Self Patient language and need for interpreter reviewed:: Yes Do you feel safe going back to the place where you live?: Yes      Need for Family Participation in Patient Care: Yes (Comment) Care giver support system in place?: Yes (comment) Current home services: DME (cane, walker) Criminal Activity/Legal Involvement Pertinent to Current Situation/Hospitalization: No - Comment as needed  Activities of Daily Living   ADL Screening (condition at time of admission) Independently performs ADLs?: Yes (appropriate for developmental  age) Is the patient deaf or have difficulty hearing?: No Does the patient have difficulty seeing, even when wearing glasses/contacts?: No Does the patient have difficulty concentrating, remembering, or making decisions?: No  Permission Sought/Granted Permission sought to share information with : Case Manager Permission granted to share information with : Yes, Verbal Permission Granted  Share Information with NAME: Case Manager     Permission granted to share info w Relationship: Gerald Leitz (dtr) [phone]     Emotional Assessment Appearance:: Appears stated age Attitude/Demeanor/Rapport: Gracious Affect (typically observed): Accepting Orientation: : Oriented to Self, Oriented to Place, Oriented to  Time, Oriented to Situation Alcohol / Substance Use: Not Applicable Psych Involvement: No (comment)  Admission diagnosis:  AKI (acute kidney injury) (HCC) [N17.9] Symptomatic anemia [D64.9] Anemia, unspecified type [D64.9] Patient Active Problem List   Diagnosis Date Noted   Symptomatic anemia 04/09/2023   Community acquired pneumonia 03/31/2023   Acute on chronic anemia 03/30/2023   CAP (community acquired pneumonia) 03/30/2023   Acute respiratory failure with hypoxia (HCC) 03/30/2023   Nocturnal hypoxemia 05/14/2022   Pleural effusion on right 03/14/2022   CKD (chronic kidney disease) stage 3, GFR 30-59 ml/min (HCC) 03/14/2022   New onset atrial fibrillation (HCC) 03/08/2022   Acute cholecystitis 03/08/2022   Acute renal failure superimposed on stage 3b chronic kidney disease (HCC) 03/08/2022   Intestinal malabsorption 02/24/2020   Hypothyroidism 02/24/2020   Benign  essential hypertension 02/24/2020   Anemia of chronic disease 02/24/2020   Adult health examination declined 02/24/2020   Primary osteoarthritis of left knee 09/23/2019   Primary osteoarthritis of right knee 09/23/2019   SOB (shortness of breath) 10/28/2018   Dyspnea 02/15/2016   OSA (obstructive sleep  apnea) 03/07/2015   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 [place] [address] Phone: [phone] Fax: [phone]     Social Drivers of Health (SDOH) Social History: SDOH Screenings   Food Insecurity: No Food Insecurity (04/13/2023)  Housing: Low Risk  (04/13/2023)  Transportation Needs: No Transportation Needs (04/13/2023)  Utilities: Not At Risk (04/13/2023)  Tobacco Use: Low Risk  (04/11/2023)   SDOH Interventions: Food Insecurity Interventions: Intervention Not Indicated, Inpatient TOC Housing Interventions: Intervention Not Indicated, Inpatient TOC Transportation Interventions: Intervention Not Indicated, Inpatient TOC Utilities Interventions: Intervention Not Indicated, Inpatient TOC   Readmission Risk Interventions    04/13/2023    2:42 PM  Readmission Risk Prevention Plan  Transportation Screening Complete  PCP or Specialist Appt within 5-7 Days Complete  Home Care Screening Complete  Medication Review (RN CM) Complete

## 2023-04-13 NOTE — Plan of Care (Signed)

## 2023-04-13 NOTE — Progress Notes (Signed)
Subjective: Feels weak. No blood in stool or hematemesis.  Objective: Vital signs in last 24 hours: Temp:  [97.3 F (36.3 C)-98.5 F (36.9 C)] 98.5 F (36.9 C) (12/21 0522) Pulse Rate:  [56-74] 56 (12/21 0522) Resp:  [16-18] 16 (12/21 0522) BP: (154-157)/(57-70) 155/70 (12/21 0522) SpO2:  [92 %-100 %] 92 % (12/21 0522) FiO2 (%):  [21 %] 21 % (12/20 2021) Weight:  [105.3 kg-106.5 kg] 106.5 kg (12/21 0538) Weight change: 3.785 kg Last BM Date : 04/12/23  PE: GEN:  NAD  Lab Results: CBC    Component Value Date/Time   WBC 4.9 04/13/2023 0536   RBC 2.64 (L) 04/13/2023 0536   HGB 7.5 (L) 04/13/2023 0536   HCT 23.9 (L) 04/13/2023 0536   PLT 169 04/13/2023 0536   MCV 90.5 04/13/2023 0536   MCH 28.4 04/13/2023 0536   MCHC 31.4 04/13/2023 0536   RDW 15.5 04/13/2023 0536   LYMPHSABS 0.7 04/13/2023 0536   MONOABS 0.5 04/13/2023 0536   EOSABS 0.0 04/13/2023 0536   BASOSABS 0.0 04/13/2023 0536  CMP     Component Value Date/Time   NA 141 04/13/2023 0536   K 3.9 04/13/2023 0536   CL 107 04/13/2023 0536   CO2 23 04/13/2023 0536   GLUCOSE 80 04/13/2023 0536   BUN 49 (H) 04/13/2023 0536   CREATININE 2.57 (H) 04/13/2023 0536   CALCIUM 7.5 (L) 04/13/2023 0536   PROT 7.1 04/09/2023 1700   ALBUMIN 3.3 (L) 04/09/2023 1700   AST 26 04/09/2023 1700   ALT 16 04/09/2023 1700   ALKPHOS 81 04/09/2023 1700   BILITOT 0.9 04/09/2023 1700   GFRNONAA 19 (L) 04/13/2023 0536   Assessment:   Anemia.  Likely multifactorial (kidney disease, gastric bypass, possible GI source). Chronic anticoagulation, apixaban, on hold.  Plan:   Hematology to assist with parenteral iron management. Follow CBCs; transfuse as needed. Offered endoscopy and colonoscopy as inpatient early next week with Dr. Levora Angel versus outpatient expedited GI work-up as originally recommended by Dr. Ewing Schlein.  Patient will think about it and let Lynn Joseph know. [facility] will follow along at distance, awaiting patient's decision  about how she wants to proceed.   Lynn Joseph 04/13/2023, 9:49 AM   Cell [phone] If no answer or after 5 PM call [phone]

## 2023-04-13 NOTE — Progress Notes (Signed)
   04/13/23 2007  BiPAP/CPAP/SIPAP  BiPAP/CPAP/SIPAP Pt Type Adult (Prefer self placement machine ready to be worn plugged in red outlet)  BiPAP/CPAP/SIPAP Resmed  Mask Type  (home mask)  Mask Size Medium  Respiratory Rate 18 breaths/min  FiO2 (%) 21 %  Patient Home Equipment No  Auto Titrate Yes (15/5)  CPAP/SIPAP surface wiped down Yes

## 2023-04-14 DIAGNOSIS — D649 Anemia, unspecified: Secondary | ICD-10-CM | POA: Diagnosis not present

## 2023-04-14 LAB — TYPE AND SCREEN
ABO/RH(D): A POS
Antibody Screen: NEGATIVE
Unit division: 0
Unit division: 0

## 2023-04-14 LAB — BPAM RBC
Blood Product Expiration Date: 202501142359
Blood Product Expiration Date: 202501152359
ISSUE DATE / TIME: 202412172243
ISSUE DATE / TIME: 202412200940
Unit Type and Rh: 6200
Unit Type and Rh: 6200

## 2023-04-14 LAB — CBC WITH DIFFERENTIAL/PLATELET
Abs Immature Granulocytes: 0.11 10*3/uL — ABNORMAL HIGH (ref 0.00–0.07)
Basophils Absolute: 0 10*3/uL (ref 0.0–0.1)
Basophils Relative: 1 %
Eosinophils Absolute: 0 10*3/uL (ref 0.0–0.5)
Eosinophils Relative: 1 %
HCT: 26 % — ABNORMAL LOW (ref 36.0–46.0)
Hemoglobin: 7.9 g/dL — ABNORMAL LOW (ref 12.0–15.0)
Immature Granulocytes: 2 %
Lymphocytes Relative: 12 %
Lymphs Abs: 0.7 10*3/uL (ref 0.7–4.0)
MCH: 28.2 pg (ref 26.0–34.0)
MCHC: 30.4 g/dL (ref 30.0–36.0)
MCV: 92.9 fL (ref 80.0–100.0)
Monocytes Absolute: 0.6 10*3/uL (ref 0.1–1.0)
Monocytes Relative: 10 %
Neutro Abs: 4.3 10*3/uL (ref 1.7–7.7)
Neutrophils Relative %: 74 %
Platelets: 197 10*3/uL (ref 150–400)
RBC: 2.8 MIL/uL — ABNORMAL LOW (ref 3.87–5.11)
RDW: 15.6 % — ABNORMAL HIGH (ref 11.5–15.5)
WBC: 5.8 10*3/uL (ref 4.0–10.5)
nRBC: 0 % (ref 0.0–0.2)

## 2023-04-14 LAB — BASIC METABOLIC PANEL
Anion gap: 10 (ref 5–15)
BUN: 43 mg/dL — ABNORMAL HIGH (ref 8–23)
CO2: 24 mmol/L (ref 22–32)
Calcium: 7.5 mg/dL — ABNORMAL LOW (ref 8.9–10.3)
Chloride: 105 mmol/L (ref 98–111)
Creatinine, Ser: 2.28 mg/dL — ABNORMAL HIGH (ref 0.44–1.00)
GFR, Estimated: 22 mL/min — ABNORMAL LOW (ref >60)
Glucose, Bld: 80 mg/dL (ref 70–99)
Potassium: 3.6 mmol/L (ref 3.5–5.1)
Sodium: 139 mmol/L (ref 135–145)

## 2023-04-14 LAB — COPPER, SERUM: Copper: 105 ug/dL (ref 80–158)

## 2023-04-14 MED ORDER — FERROUS SULFATE 325 (65 FE) MG PO TABS: 325.0000 mg | ORAL_TABLET | Freq: Two times a day (BID) | ORAL | 0 refills | Status: AC

## 2023-04-14 MED ORDER — PREDNISONE 20 MG PO TABS: 20.0000 mg | ORAL_TABLET | Freq: Every day | ORAL | 0 refills | Status: AC

## 2023-04-14 NOTE — TOC Transition Note (Signed)
Transition of Care [facility]) - Discharge Note   Patient Details  Name: Lynn Joseph MRN: [id number] Date of Birth: Feb 25, 1946  Transition [facility] Care [facility]) CM/SW Contact:  Adrian Prows, RN Phone Number: 04/14/2023, 11:29 AM   Clinical Narrative:    D/C orders received; no TOC needs.   Final next level of care: Home/Self Care Barriers to Discharge: No Barriers Identified   Patient Goals and CMS Choice Patient states their goals for this hospitalization and ongoing recovery are:: home CMS Medicare.gov Compare Post Acute Care list provided to:: Patient   [place] ownership interest in [facility].provided to:: Patient    Discharge Placement                       Discharge Plan and Services Additional resources added to the After Visit Summary for     Discharge Planning Services: CM Consult                                 Social Drivers of Health (SDOH) Interventions SDOH Screenings   Food Insecurity: No Food Insecurity (04/13/2023)  Housing: Low Risk  (04/13/2023)  Transportation Needs: No Transportation Needs (04/13/2023)  Utilities: Not At Risk (04/13/2023)  Tobacco Use: Low Risk  (04/11/2023)     Readmission Risk Interventions    04/13/2023    2:42 PM  Readmission Risk Prevention Plan  Transportation Screening Complete  PCP or Specialist Appt within 5-7 Days Complete  Home Care Screening Complete  Medication Review (RN CM) Complete

## 2023-04-14 NOTE — Plan of Care (Signed)

## 2023-04-14 NOTE — Discharge Summary (Signed)
Physician Discharge Summary  [name]:[id number] DOB: [birth year] DOA: 04/09/2023  PCP: Alysia Penna, MD  Admit date: 04/09/2023 Discharge date: 04/14/2023 30 Day Unplanned Readmission Risk Score    Flowsheet Row ED to Hosp-Admission (Current) from 04/09/2023 in [facility]  30 Day Unplanned Readmission Risk Score (%) 17.3 Filed at 04/14/2023 0801       This score is the patient's risk of an unplanned readmission within 30 days of being discharged (0 -100%). The score is based on dignosis, age, lab data, medications, orders, and past utilization.   Low:  0-14.9   Medium: 15-21.9   High: 22-29.9   Extreme: 30 and above          Admitted From: Home  Disposition: Home  Recommendations for Outpatient Follow-up:  Follow up with PCP in 2 to 3 days. Please obtain BMP/CBC in 2 to 3 days Follow-up with nephrology as outpatient in 2 to 3 weeks, they will call and schedule appointment. Follow-up with GI next week to schedule outpatient EGD.  Please call their office Follow-up with oncology/Dr. Arbutus Ped, they shall call you for appointment. Please follow up with your PCP on the following pending results: Unresulted Labs (From admission, onward)     Start     Ordered   04/13/23 0500  Copper, serum  Tomorrow morning,   R        04/12/23 1354   04/13/23 0500  Methylmalonic acid, serum  Tomorrow morning,   R        04/12/23 1354   04/11/23 1556  ANCA Titers  (Anti-Neutrophilic Cystoplasmic Antibody Panel (PNL))  Once,   R        04/11/23 1556   04/10/23 1037  Protein electrophoresis, serum  Once,   R        04/10/23 1037   04/10/23 0500  Occult blood card to lab, stool RN will collect  Daily,   R     Question:  Specimen to be collected by:  Answer:  RN will collect   04/09/23 2034              Home Health: None Equipment/Devices: None  Discharge Condition: Stable CODE STATUS: Full code Diet recommendation: Renal  Subjective: Seen and examined.   She has no complaints.  She wants to go home.  Overall she is feeling better.  She has decided to have EGD as outpatient.  Brief/Interim Summary: Lynn Joseph is a 77 y.o. female with medical history significant for atrial fibrillation on Eliquis, chronic anemia, hypertension, obesity obesity, obstructive sleep apnea on CPAP nightly, and hypothyroidism who presented with severe fatigue, no energy, and just not feeling well.  She denied any bright red blood per rectum or vomiting blood or hematuria or seeing any blood loss at all. She has been having trouble with numbness and tingling in her hands and feet which is very uncomfortable.  She does have some pain in her hands particularly at night.  A month ago she was completely independent driving ambulating etc.  Now she is having difficulty walking and using a cane.No fevers or chills.  The patient was admitted to hospital service for the following issues.   Recurrent symptomatic/acute blood loss anemia secondary to presumed upper GI bleed secondary to anticoagulation: Second admission within a month.  She received 1 unit of transfusion for hemoglobin of 7 on 04/10/2023.  Hemoglobin again dropped to 6.7 on 04/12/2023 requiring 1 unit of blood transfusion.   FOBT  positive.  Per patient, she has had multiple endoscopies in the past but it does not look like she has actually had one since she has been on Eliquis.  Concern for hematological malignancy, was seen by oncology and they recommended outpatient workup but did not highly suspect any hematological malignancy and ordered IV iron on 04/12/2023.  Patient was seen by Dr. Ewing Schlein 04/11/2023, recommended outpatient endoscopy and colonoscopy and recommended CT abdomen pelvis with only oral contrast.  I ordered CT abdomen with oral contrast which is unremarkable.  After talking to oncology/hematology, family decided to have EGD and colonoscopy while inpatient.  Patient was seen by Dr. Dulce Sellar 04/13/2023, who  offered her to have EGD/colonoscopy as inpatient versus outpatient but per patient, she was told that he will not be able to do this as inpatient until Tuesday due to shortage of staffing due to holidays and she told him that she would think and let us know.  Today she has made a decision that she does not want to be in the hospital until Tuesday and wants to discharge today and has decided to have EGD/colonoscopy done as outpatient.  She will call GI office and schedule that.  Her hemoglobin has remained stable since last 2 days.  I am increasing her Feosol from daily to twice daily.  She requested me to write in her discharge summary to check her CBC and BMP in 2 days which I have done.  She will need to call PCPs office tomorrow to order those labs for her.  Patient's light chains are elevated both gamma and lambda but per nephrology, 1 of those have to be significantly elevated than the other to make a diagnosis of multiple myeloma and per nephrology, they do not believe she has multiple myeloma.  Regardless, she is going to follow-up with both nephrology and oncology as outpatient.   Paroxysmal atrial fibrillation: In sinus rhythm.  We have been holding her Eliquis.  I have advised her to hold her Eliquis until Friday but only take it if her hemoglobin is stable and she is cleared by either GI or PCP.   AKI on CKD stage IIIb: Baseline creatinine around 1.6-1.7, was recently discharged with 1.9.  Presented with 3.4 and since it did not improve, nephrology consulted, started on IV fluids, creatinine improved somewhat and down to 2.3 today.  Nephrology signed off yesterday and cleared her for discharge.  Nephrology will call her and set up outpatient appointment in next few weeks.   Obstructive sleep apnea: CPAP.   Fatigue/joint pains/numbness and tingling in hands and feet: Apparently she was on short course of steroids at some point in time and she felt better.  ESR and CRP elevated, ANA pending.  RF  negative.  Protein electrophoresis pending.  She has been started on prednisone empirically by previous hospitalist and she wants this to continue so she can feel better for the Christmas at least.  Will need to see rheumatology as outpatient for further workup.  I have prescribed a few more days of prednisone.   Acquired hypothyroidism: Continue Synthroid.   Hyperlipidemia: Continue Crestor.   Dyspnea: She was complaining of some dyspnea yesterday, chest x-ray obtained which shows vascular congestion, lungs clear to auscultation.  She has no complaints today.  Lungs clear to auscultation again today.  She is advised to resume her Lasix only after a week from today and only as needed.  Discharge plan was discussed with patient and/or family member and they verbalized understanding  and agreed with it.  Discharge Diagnoses:  Principal Problem:   Symptomatic anemia Active Problems:   OSA (obstructive sleep apnea)   Acute renal failure superimposed on stage 3b chronic kidney disease (HCC)    Discharge Instructions   Allergies as of 04/14/2023       Reactions   Amlodipine Swelling, Other (See Comments)   Lowered B/P too much, made lethargic, also   Carvedilol Other (See Comments)   Dropped blood pressure too low   Penicillin G Sodium Other (See Comments)   GI upset        Medication List     PAUSE taking these medications    apixaban 5 MG Tabs tablet Wait to take this until: April 19, 2023 Commonly known as: Eliquis Take 1 tablet (5 mg total) by mouth 2 (two) times daily.   furosemide 20 MG tablet Wait to take this until: April 21, 2023 Commonly known as: LASIX Take 20 mg by mouth daily as needed (for a weight gain of 4-5 pounds in the period of a couple of days).       TAKE these medications    CALCIUM-MAGNESUIUM-ZINC PO Take 1 tablet by mouth 2 (two) times a week.   colchicine 0.6 MG tablet Take 0.3 mg by mouth daily as needed (AS DIRECTED for gout  flares).   cyanocobalamin 1000 MCG/ML injection Commonly known as: VITAMIN B12 Inject 1,000 mcg into the muscle every 30 (thirty) days.   febuxostat 40 MG tablet Commonly known as: ULORIC Take 40 mg by mouth daily.   ferrous sulfate 325 (65 FE) MG tablet Take 1 tablet (325 mg total) by mouth 2 (two) times daily with a meal. What changed: when to take this   gabapentin 100 MG capsule Commonly known as: NEURONTIN Take 100 mg by mouth at bedtime.   hydrALAZINE 100 MG tablet Commonly known as: APRESOLINE Take 1 tablet (100 mg total) by mouth 3 (three) times daily.   Levothyroxine Sodium 100 MCG Caps Commonly known as: Tirosint Take 1 capsule (100 mcg total) by mouth daily before breakfast.   Nebivolol HCl 20 MG Tabs Commonly known as: Bystolic Take 1 tablet (20 mg total) by mouth 2 (two) times daily.   predniSONE 20 MG tablet Commonly known as: DELTASONE Take 1 tablet (20 mg total) by mouth daily with breakfast for 4 days. Start taking on: April 15, 2023   rosuvastatin 5 MG tablet Commonly known as: CRESTOR Take 1 tablet (5 mg total) by mouth daily.   TYLENOL 500 MG tablet Generic drug: acetaminophen Take 1,000 mg by mouth every 6 (six) hours as needed for mild pain (pain score 1-3), headache or fever.        Follow-up Information     Alysia Penna, MD Follow up in 3 day(s).   Specialty: Internal Medicine Contact information: [address] [place] [address] [phone]                Allergies  Allergen Reactions   Amlodipine Swelling and Other (See Comments)    Lowered B/P too much, made lethargic, also     Carvedilol Other (See Comments)    Dropped blood pressure too low   Penicillin G Sodium Other (See Comments)    GI upset    Consultations: Nephrology, GI, oncology   Procedures/Studies: CT ABDOMEN PELVIS WO CONTRAST Result Date: 04/13/2023 CLINICAL DATA:  Possible hiatal hernia. EXAM: CT ABDOMEN AND PELVIS WITHOUT CONTRAST  TECHNIQUE: Multidetector CT imaging of the abdomen and pelvis was  performed following the standard protocol without IV contrast. RADIATION DOSE REDUCTION: This exam was performed according to the departmental dose-optimization program which includes automated exposure control, adjustment of the mA and/or kV according to patient size and/or use of iterative reconstruction technique. COMPARISON:  03/08/2022 FINDINGS: Lower chest: Lungs demonstrate small bilateral pleural effusions. Focal atelectasis over the lingula. There is a hazy patchy airspace process over the mid to lower lungs bilaterally with associated subtle areas of nodularity as this may represent acute pneumonia of bacterial or viral origin, interstitial edema or inflammatory process. Airways are unremarkable. Heart is normal size. Minimal calcified plaque over the descending thoracic aorta. No evidence of hiatal hernia. Hepatobiliary: Previous cholecystectomy. Liver and biliary tree are unremarkable. Pancreas: Normal. Spleen: Borderline splenomegaly. Adrenals/Urinary Tract: Adrenal glands are normal. Kidneys are normal in size without hydronephrosis or nephrolithiasis. Ureters and bladder are normal. 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 Small bowel is normal. Appendix is unremarkable. Colon is within normal. Vascular/Lymphatic: Mild calcified plaque over the abdominal aorta which is normal in caliber. Remaining vascular structures are unremarkable. No adenopathy. Reproductive: Normal. Other: No inflammatory change or free fluid in the abdomen. Small amount of free fluid over the posterior pelvis. Umbilical hernia with fascial defect measuring 1 cm in with and hernia sac measuring 3 cm containing only peritoneal fat. Musculoskeletal: Mild degenerative changes of the spine and hips. Vacuum disc at the L4-5 level. IMPRESSION: 1. No acute findings in the abdomen/pelvis. No evidence of hiatal  hernia. 2. Small bilateral pleural effusions with hazy patchy airspace process over the mid to lower lungs bilaterally with associated subtle areas of nodularity as this may represent acute pneumonia of bacterial or viral origin, interstitial edema or inflammatory process. 3. Borderline splenomegaly. 4. Small amount of nonspecific free fluid over the posterior pelvis. 5. 3 cm umbilical hernia containing only peritoneal fat. 6. Aortic atherosclerosis. Aortic Atherosclerosis (ICD10-I70.0). Electronically Signed   By: Elberta Fortis M.D.   On: 04/13/2023 14:01   DG CHEST PORT 1 VIEW Result Date: 04/12/2023 CLINICAL DATA:  Dyspnea EXAM: PORTABLE CHEST 1 VIEW COMPARISON:  04/09/2023, 03/26/2023 FINDINGS: Cardiomegaly with vascular congestion and mild diffuse interstitial opacity suggestive of minimal edema. Possible trace pleural effusions. No consolidation or pneumothorax. IMPRESSION: Cardiomegaly with vascular congestion and mild diffuse interstitial opacity suggestive of minimal edema. Possible trace pleural effusions. Electronically Signed   By: Jasmine Pang M.D.   On: 04/12/2023 19:03   US RENAL Result Date: 04/11/2023 CLINICAL DATA:  Acute kidney injury EXAM: RENAL / URINARY TRACT ULTRASOUND COMPLETE COMPARISON:  CT chest abdomen and pelvis 03/08/2022 FINDINGS: Right Kidney: Renal measurements: 8.1 x 4.6 x 3.9 cm = volume: 76 mL. Echogenicity is increased. No mass or hydronephrosis visualized. Left Kidney: Renal measurements: 9.4 x 5.2 x 3.3 cm = volume: 85 mL. Echogenicity is increased. No mass or hydronephrosis visualized. Bladder: Appears normal for degree of bladder distention. Other: None. IMPRESSION: 1. No hydronephrosis. 2. Increased renal echogenicity bilaterally consistent with medical renal disease. Electronically Signed   By: Darliss Cheney M.D.   On: 04/11/2023 01:05   DG Chest 2 View Result Date: 04/09/2023 CLINICAL DATA:  Cough.  Fatigue. EXAM: CHEST - 2 VIEW COMPARISON:  Radiograph  03/30/2023, CT 03/08/2022 FINDINGS: Mild cardiomegaly. Small left pleural effusion, similar to prior with otherwise improved left basilar aeration. Peribronchial thickening as well as subtle ill-defined pulmonary opacity primarily in the right mid and upper lung zones. No pneumothorax. IMPRESSION: 1. Peribronchial thickening as  well as subtle ill-defined pulmonary opacity primarily in the right mid and upper lung zones. This may be infectious or asymmetric edema. 2. Small left pleural effusion. Left basilar aeration has otherwise improved since earlier this month. 3. Mild cardiomegaly. Electronically Signed   By: Narda Rutherford M.D.   On: 04/09/2023 19:56   DG Chest 2 View Result Date: 03/30/2023 CLINICAL DATA:  SOB EXAM: CHEST - 2 VIEW COMPARISON:  03/26/2023. FINDINGS: Left base consolidation and effusion stable finding. Mild pulmonary vascular congestion. No pneumothorax. Cardiac silhouette appears prominent. IMPRESSION: Left base consolidation and effusion. Mild vascular congestion. Electronically Signed   By: Layla Maw M.D.   On: 03/30/2023 15:03   DG Chest 2 View Result Date: 03/26/2023 CLINICAL DATA:  Short of breath for 1 and half weeks. EXAM: CHEST - 2 VIEW COMPARISON:  05/14/2022.  CT, 03/08/2022. FINDINGS: Cardiac silhouette top-normal in size. No mediastinal or hilar masses. No evidence of adenopathy. Left lower lobe opacity obscures portions of the left hemidiaphragm. Remainder of the lungs is clear. Possible small left effusion. No right pleural effusion. No pneumothorax. Skeletal structures are intact. IMPRESSION: 1. Left lower lobe opacity consistent with atelectasis or pneumonia. Electronically Signed   By: Amie Portland M.D.   On: 03/26/2023 10:55     Discharge Exam: Vitals:   04/13/23 2013 04/14/23 0505  BP: (!) 164/60 (!) 174/64  Pulse: 66 (!) 55  Resp: 18 15  Temp: 97.9 F (36.6 C) 97.6 F (36.4 C)  SpO2: 93% 95%   Vitals:   04/13/23 1408 04/13/23 2013 04/14/23  0500 04/14/23 0505  BP: (!) 157/64 (!) 164/60  (!) 174/64  Pulse: 66 66  (!) 55  Resp: 18 18  15   Temp: 97.7 F (36.5 C) 97.9 F (36.6 C)  97.6 F (36.4 C)  TempSrc: Oral Oral  Oral  SpO2: 93% 93%  95%  Weight:   106.9 kg   Height:        General: Pt is alert, awake, not in acute distress Cardiovascular: RRR, S1/S2 +, no rubs, no gallops Respiratory: CTA bilaterally, no wheezing, no rhonchi Abdominal: Soft, NT, ND, bowel sounds + Extremities: no edema, no cyanosis    The results of significant diagnostics from this hospitalization (including imaging, microbiology, ancillary and laboratory) are listed below for reference.     Microbiology: Recent Results (from the past 240 hours)  Resp panel by RT-PCR (RSV, Flu A&B, Covid) Anterior Nasal Swab     Status: None   Collection Time: 04/09/23  6:31 PM   Specimen: Anterior Nasal Swab  Result Value Ref Range Status   SARS Coronavirus 2 by RT PCR NEGATIVE NEGATIVE Final    Comment: (NOTE) SARS-CoV-2 target nucleic acids are NOT DETECTED.  The SARS-CoV-2 RNA is generally detectable in upper respiratory specimens during the acute phase of infection. The lowest concentration of SARS-CoV-2 viral copies this assay can detect is 138 copies/mL. A negative result does not preclude SARS-Cov-2 infection and should not be used as the sole basis for treatment or other patient management decisions. A negative result may occur with  improper specimen collection/handling, submission of specimen other than nasopharyngeal swab, presence of viral mutation(s) within the areas targeted by this assay, and inadequate number of viral copies(<138 copies/mL). A negative result must be combined with clinical observations, patient history, and epidemiological information. The expected result is Negative.  Fact Sheet for Patients:  BloggerCourse.com  Fact Sheet for Healthcare Providers:   SeriousBroker.it  This test is no t  yet approved or cleared by the [place] and  has been authorized for detection and/or diagnosis of SARS-CoV-2 by FDA under an Emergency Use Authorization (EUA). This EUA will remain  in effect (meaning this test can be used) for the duration of the COVID-19 declaration under Section 564(b)(1) of the Act, 21 U.S.C.section 360bbb-3(b)(1), unless the authorization is terminated  or revoked sooner.       Influenza A by PCR NEGATIVE NEGATIVE Final   Influenza B by PCR NEGATIVE NEGATIVE Final    Comment: (NOTE) The Xpert Xpress SARS-CoV-2/FLU/RSV plus assay is intended as an aid in the diagnosis of influenza from Nasopharyngeal swab specimens and should not be used as a sole basis for treatment. Nasal washings and aspirates are unacceptable for Xpert Xpress SARS-CoV-2/FLU/RSV testing.  Fact Sheet for Patients: BloggerCourse.com  Fact Sheet for Healthcare Providers: SeriousBroker.it  This test is not yet approved or cleared by the [place] FDA and has been authorized for detection and/or diagnosis of SARS-CoV-2 by FDA under an Emergency Use Authorization (EUA). This EUA will remain in effect (meaning this test can be used) for the duration of the COVID-19 declaration under Section 564(b)(1) of the Act, 21 U.S.C. section 360bbb-3(b)(1), unless the authorization is terminated or revoked.     Resp Syncytial Virus by PCR NEGATIVE NEGATIVE Final    Comment: (NOTE) Fact Sheet for Patients: BloggerCourse.com  Fact Sheet for Healthcare Providers: SeriousBroker.it  This test is not yet approved or cleared by the [place] FDA and has been authorized for detection and/or diagnosis of SARS-CoV-2 by FDA under an Emergency Use Authorization (EUA). This EUA will remain in effect (meaning this test can be used) for  the duration of the COVID-19 declaration under Section 564(b)(1) of the Act, 21 U.S.C. section 360bbb-3(b)(1), unless the authorization is terminated or revoked.  Performed at [facility], [address], [place], [place] [address]   C Difficile Quick Screen w PCR reflex     Status: None   Collection Time: 04/10/23  9:31 AM   Specimen: Stool  Result Value Ref Range Status   C Diff antigen NEGATIVE NEGATIVE Final   C Diff toxin NEGATIVE NEGATIVE Final   C Diff interpretation No C. difficile detected.  Final    Comment: Performed at [facility], [address] [place], [place] [address]  Culture, blood (Routine X 2) w Reflex to ID Panel     Status: None (Preliminary result)   Collection Time: 04/10/23  7:06 PM   Specimen: BLOOD RIGHT ARM  Result Value Ref Range Status   Specimen Description   Final    BLOOD RIGHT ARM Performed at [facility], [address], [place], [place] [address]    Special Requests   Final    BOTTLES DRAWN AEROBIC AND ANAEROBIC Blood Culture results may not be optimal due to an inadequate volume of blood received in culture bottles Performed at [facility], [address], [place], [place] [address]    Culture   Final    NO GROWTH 4 DAYS Performed at [facility], [address], [place], [place] [address]    Report Status PENDING  Incomplete  Culture, blood (Routine X 2) w Reflex to ID Panel     Status: None (Preliminary result)   Collection Time: 04/10/23  7:06 PM   Specimen: BLOOD LEFT ARM  Result Value Ref Range Status   Specimen Description   Final  BLOOD LEFT ARM Performed at [facility], [address] [place], [place] [address]    Special Requests   Final    BOTTLES DRAWN AEROBIC AND ANAEROBIC Blood Culture results may not be optimal due to an inadequate volume of blood received in culture bottles Performed at [facility], [address] [place], [place] [address]    Culture   Final    NO GROWTH 4 DAYS Performed at [facility], [address], [place], [place] [address]    Report Status PENDING  Incomplete     Labs: BNP (last 3 results) Recent Labs    03/30/23 1305  BNP 311.9*   Basic Metabolic Panel: Recent Labs  Lab 04/09/23 1700 04/10/23 1110 04/12/23 0448 04/13/23 0536 04/14/23 0536  NA 138 138 136 141 139  K 4.0 4.0 4.4 3.9 3.6  CL 106 107 105 107 105  CO2 21* 20* 22 23 24   GLUCOSE 132* 85 97 80 80  BUN 60* 55* 53* 49* 43*  CREATININE 3.41* 3.37* 2.95* 2.57* 2.28*  CALCIUM 8.2* 8.1* 7.7* 7.5* 7.5*   Liver Function Tests: Recent Labs  Lab 04/09/23 1700  AST 26  ALT 16  ALKPHOS 81  BILITOT 0.9  PROT 7.1  ALBUMIN 3.3*   No results for input(s): "LIPASE", "AMYLASE" in the last 168 hours. No results for input(s): "AMMONIA" in the last 168 hours. CBC: Recent Labs  Lab 04/09/23 1700 04/10/23 0521 04/10/23 1110 04/12/23 0448 04/12/23 1510 04/13/23 0536 04/14/23 0536  WBC 5.0  --  5.5 5.1  --  4.9 5.8  NEUTROABS  --   --   --  3.8  --  3.7 4.3  HGB 7.0*   < > 8.0* 6.7* 8.7* 7.5* 7.9*  HCT 23.0*   < > 25.9* 21.1* 27.2* 23.9* 26.0*  MCV 89.1  --  90.9 89.0  --  90.5 92.9  PLT 217  --  206 173  --  169 197   < > = values in this interval not displayed.   Cardiac Enzymes: Recent Labs  Lab 04/09/23 2104  CKTOTAL 61   BNP: Invalid input(s): "POCBNP" CBG: No results for input(s): "GLUCAP" in the last 168 hours. D-Dimer No results for input(s): "DDIMER" in the last 72 hours. Hgb A1c No results for input(s): "HGBA1C" in the last 72 hours. Lipid Profile No results for input(s): "CHOL", "HDL", "LDLCALC", "TRIG", "CHOLHDL", "LDLDIRECT" in the last 72 hours. Thyroid function studies No results for input(s): "TSH", "T4TOTAL", "T3FREE", "THYROIDAB" in the last 72 hours.  Invalid input(s): "FREET3" Anemia work up No results for input(s): "VITAMINB12", "FOLATE", "FERRITIN",  "TIBC", "IRON", "RETICCTPCT" in the last 72 hours. Urinalysis    Component Value Date/Time   COLORURINE YELLOW 04/10/2023 1914   APPEARANCEUR HAZY (A) 04/10/2023 1914   LABSPEC 1.009 04/10/2023 1914   PHURINE 5.0 04/10/2023 1914   GLUCOSEU NEGATIVE 04/10/2023 1914   HGBUR LARGE (A) 04/10/2023 1914   BILIRUBINUR NEGATIVE 04/10/2023 1914   KETONESUR NEGATIVE 04/10/2023 1914   PROTEINUR 30 (A) 04/10/2023 1914   NITRITE NEGATIVE 04/10/2023 1914   LEUKOCYTESUR NEGATIVE 04/10/2023 1914   Sepsis Labs Recent Labs  Lab 04/10/23 1110 04/12/23 0448 04/13/23 0536 04/14/23 0536  WBC 5.5 5.1 4.9 5.8   Microbiology Recent Results (from the past 240 hours)  Resp panel by RT-PCR (RSV, Flu A&B, Covid) Anterior Nasal Swab     Status: None   Collection Time: 04/09/23  6:31 PM  Specimen: Anterior Nasal Swab  Result Value Ref Range Status   SARS Coronavirus 2 by RT PCR NEGATIVE NEGATIVE Final    Comment: (NOTE) SARS-CoV-2 target nucleic acids are NOT DETECTED.  The SARS-CoV-2 RNA is generally detectable in upper respiratory specimens during the acute phase of infection. The lowest concentration of SARS-CoV-2 viral copies this assay can detect is 138 copies/mL. A negative result does not preclude SARS-Cov-2 infection and should not be used as the sole basis for treatment or other patient management decisions. A negative result may occur with  improper specimen collection/handling, submission of specimen other than nasopharyngeal swab, presence of viral mutation(s) within the areas targeted by this assay, and inadequate number of viral copies(<138 copies/mL). A negative result must be combined with clinical observations, patient history, and epidemiological information. The expected result is Negative.  Fact Sheet for Patients:  BloggerCourse.com  Fact Sheet for Healthcare Providers:  SeriousBroker.it  This test is no t yet approved or  cleared by the [place] FDA and  has been authorized for detection and/or diagnosis of SARS-CoV-2 by FDA under an Emergency Use Authorization (EUA). This EUA will remain  in effect (meaning this test can be used) for the duration of the COVID-19 declaration under Section 564(b)(1) of the Act, 21 U.S.C.section 360bbb-3(b)(1), unless the authorization is terminated  or revoked sooner.       Influenza A by PCR NEGATIVE NEGATIVE Final   Influenza B by PCR NEGATIVE NEGATIVE Final    Comment: (NOTE) The Xpert Xpress SARS-CoV-2/FLU/RSV plus assay is intended as an aid in the diagnosis of influenza from Nasopharyngeal swab specimens and should not be used as a sole basis for treatment. Nasal washings and aspirates are unacceptable for Xpert Xpress SARS-CoV-2/FLU/RSV testing.  Fact Sheet for Patients: BloggerCourse.com  Fact Sheet for Healthcare Providers: SeriousBroker.it  This test is not yet approved or cleared by the [place] FDA and has been authorized for detection and/or diagnosis of SARS-CoV-2 by FDA under an Emergency Use Authorization (EUA). This EUA will remain in effect (meaning this test can be used) for the duration of the COVID-19 declaration under Section 564(b)(1) of the Act, 21 U.S.C. section 360bbb-3(b)(1), unless the authorization is terminated or revoked.     Resp Syncytial Virus by PCR NEGATIVE NEGATIVE Final    Comment: (NOTE) Fact Sheet for Patients: BloggerCourse.com  Fact Sheet for Healthcare Providers: SeriousBroker.it  This test is not yet approved or cleared by the [place] FDA and has been authorized for detection and/or diagnosis of SARS-CoV-2 by FDA under an Emergency Use Authorization (EUA). This EUA will remain in effect (meaning this test can be used) for the duration of the COVID-19 declaration under Section 564(b)(1) of the Act, 21  U.S.C. section 360bbb-3(b)(1), unless the authorization is terminated or revoked.  Performed at [facility], [address] [place], [place] [address]   C Difficile Quick Screen w PCR reflex     Status: None   Collection Time: 04/10/23  9:31 AM   Specimen: Stool  Result Value Ref Range Status   C Diff antigen NEGATIVE NEGATIVE Final   C Diff toxin NEGATIVE NEGATIVE Final   C Diff interpretation No C. difficile detected.  Final    Comment: Performed at [facility], [address] [place], [place] [address]  Culture, blood (Routine X 2) w Reflex to ID Panel     Status: None (Preliminary result)   Collection Time: 04/10/23  7:06 PM   Specimen: BLOOD  RIGHT ARM  Result Value Ref Range Status   Specimen Description   Final    BLOOD RIGHT ARM Performed at [facility], [address], [place], [place] [address]    Special Requests   Final    BOTTLES DRAWN AEROBIC AND ANAEROBIC Blood Culture results may not be optimal due to an inadequate volume of blood received in culture bottles Performed at [facility], [address] [place], [place] [address]    Culture   Final    NO GROWTH 4 DAYS Performed at [facility], [address], [place], [place] [address]    Report Status PENDING  Incomplete  Culture, blood (Routine X 2) w Reflex to ID Panel     Status: None (Preliminary result)   Collection Time: 04/10/23  7:06 PM   Specimen: BLOOD LEFT ARM  Result Value Ref Range Status   Specimen Description   Final    BLOOD LEFT ARM Performed at [facility], [address], [place], [place] [address]    Special Requests   Final    BOTTLES DRAWN AEROBIC AND ANAEROBIC Blood Culture results may not be optimal due to an inadequate volume of blood received in culture bottles Performed at [facility], [address] [place], [place] [address]    Culture   Final    NO GROWTH 4 DAYS Performed at  [facility], [address] [place], [place] [address]    Report Status PENDING  Incomplete    FURTHER DISCHARGE INSTRUCTIONS:   Get Medicines reviewed and adjusted: Please take all your medications with you for your next visit with your Primary MD   Laboratory/radiological data: Please request your Primary MD to go over all hospital tests and procedure/radiological results at the follow up, please ask your Primary MD to get all Hospital records sent to his/her office.   In some cases, they will be blood work, cultures and biopsy results pending at the time of your discharge. Please request that your primary care M.D. goes through all the records of your hospital data and follows up on these results.   Also Note the following: If you experience worsening of your admission symptoms, develop shortness of breath, life threatening emergency, suicidal or homicidal thoughts you must seek medical attention immediately by calling 911 or calling your MD immediately  if symptoms less severe.   You must read complete instructions/literature along with all the possible adverse reactions/side effects for all the Medicines you take and that have been prescribed to you. Take any new Medicines after you have completely understood and accpet all the possible adverse reactions/side effects.    Do not drive when taking Pain medications or sleeping medications (Benzodaizepines)   Do not take more than prescribed Pain, Sleep and Anxiety Medications. It is not advisable to combine anxiety,sleep and pain medications without talking with your primary care practitioner   Special Instructions: If you have smoked or chewed Tobacco  in the last 2 yrs please stop smoking, stop any regular Alcohol  and or any Recreational drug use.   Wear Seat belts while driving.   Please note: You were cared for by a hospitalist during your hospital stay. Once you are discharged, your primary care physician will handle any  further medical issues. Please note that NO REFILLS for any discharge medications will be authorized once you are discharged, as it is imperative that you return to your primary care physician (  or establish a relationship with a primary care physician if you do not have one) for your post hospital discharge needs so that they can reassess your need for medications and monitor your lab values  Time coordinating discharge: Over 30 minutes  SIGNED:   Hughie Closs, MD  [facility] 04/14/2023, 10:45 AM *Please note that this is a verbal dictation therefore any spelling or grammatical errors are due to the "Dragon Medical One" system interpretation. If 7PM-7AM, please contact night-coverage [email or web address]

## 2023-04-14 NOTE — Evaluation (Signed)
Physical Therapy Evaluation Patient Details Name: Lynn Joseph MRN: [id number] DOB: 11-28-1945 Today's Date: 04/14/2023  History of Present Illness  77 y.o. female who presents with severe fatigue, no energy, and just not feeling well. admitted with symptomatic anemia. PMH: atrial fibrillation on Eliquis, chronic anemia, hypertension, obesity obesity, obstructive sleep apnea on CPAP nightly, and hypothyroidism  Clinical Impression  Patient evaluated by Physical Therapy with no further acute PT needs identified. All education has been completed and the patient has no further questions.  Pt is independent at her baseline, reports still feeling weaker than normal; reviewed activity progression, walking each hour, performing STS - incr reps and how to grade STS to incr difficulty. We also discussed fitness tracker/watch to provide prompts to mobilize, monitor activity as well as monitoring HR etc. No formal f/u PT recommended at this time; pt inquires about HHPT however would likely not be appropriate given pt ind/mod I currently.   See below for any follow-up Physical Therapy or equipment needs. PT is signing off. Thank you for this referral.         If plan is discharge home, recommend the following:     Can travel by private vehicle        Equipment Recommendations None recommended by PT  Recommendations for Other Services       Functional Status Assessment Patient has had a recent decline in their functional status and demonstrates the ability to make significant improvements in function in a reasonable and predictable amount of time.     Precautions / Restrictions Precautions Precautions: None      Mobility  Bed Mobility               General bed mobility comments: pt EOB on arrival    Transfers Overall transfer level: Modified independent                      Ambulation/Gait Ambulation/Gait assistance: Modified independent (Device/Increase time),  Supervision Gait Distance (Feet): 200 Feet   Gait Pattern/deviations: Step-through pattern, Decreased stride length Gait velocity: decr     General Gait Details: slight drifting, no overt LOB. pt does use cane at home at times, discussed continue [facility] use for safety while pt feeling weaker than her  baseline  Stairs            Wheelchair Mobility     Tilt Bed    Modified Rankin (Stroke Patients Only)       Balance Overall balance assessment: Mild deficits observed, not formally tested                                           Pertinent Vitals/Pain Pain Assessment Pain Assessment: No/denies pain    Home Living Family/patient expects to be discharged to:: Private residence Living Arrangements: Alone   Type of Home: House Home Access: Level entry       Home Layout: One level Home Equipment: Agricultural consultant (2 wheels);Cane - single point Additional Comments: pt runs private yorkie rescue; 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 while she is in hospital    Prior Function Prior Level of Function : Independent/Modified Independent             Mobility Comments: uses cane at times       Extremity/Trunk Assessment   Upper Extremity Assessment Upper Extremity Assessment:  Overall WFL for tasks assessed    Lower Extremity Assessment Lower Extremity Assessment: Overall WFL for tasks assessed       Communication   Communication Communication: No apparent difficulties  Cognition Arousal: Alert Behavior During Therapy: WFL for tasks assessed/performed Overall Cognitive Status: Within Functional Limits for tasks assessed                                          General Comments      Exercises     Assessment/Plan    PT Assessment Patient does not need any further PT services  PT Problem List         PT Treatment Interventions      PT Goals (Current goals can be found in the Care Plan section)  Acute  Rehab PT Goals PT Goal Formulation: All assessment and education complete, DC therapy    Frequency       Co-evaluation               AM-PAC PT "6 Clicks" Mobility  Outcome Measure Help needed turning from your back to your side while in a flat bed without using bedrails?: None Help needed moving from lying on your back to sitting on the side of a flat bed without using bedrails?: None Help needed moving to and from a bed to a chair (including a wheelchair)?: None Help needed standing up from a chair using your arms (e.g., wheelchair or bedside chair)?: None Help needed to walk in hospital room?: None Help needed climbing 3-5 steps with a railing? : A Little 6 Click Score: 23    End of Session   Activity Tolerance: Patient tolerated treatment well Patient left: with call bell/phone within reach;in chair   PT Visit Diagnosis: Other abnormalities of gait and mobility (R26.89)    Time: 1610-9604 PT Time Calculation (min) (ACUTE ONLY): 16 min   Charges:   PT Evaluation $PT Eval Low Complexity: 1 Low   PT General Charges $$ ACUTE PT VISIT: 1 Visit         Santosh Petter, PT  Acute Rehab Dept [facility]) [phone]  04/14/2023   [facility] 04/14/2023, 10:42 AM

## 2023-04-14 NOTE — Progress Notes (Signed)
Pt was discharged home today. Instructions were reviewed with patient, and questions were answered. Pt was taken to main entrance via wheelchair by NT.  

## 2023-04-15 ENCOUNTER — Telehealth: Payer: Self-pay | Admitting: Cardiovascular Disease

## 2023-04-15 LAB — CULTURE, BLOOD (ROUTINE X 2)
Culture: NO GROWTH
Culture: NO GROWTH

## 2023-04-15 LAB — METHYLMALONIC ACID, SERUM: Methylmalonic Acid, Quantitative: 342 nmol/L (ref 0–378)

## 2023-04-15 NOTE — Telephone Encounter (Signed)
Patient called in for sooner appt, she states that she was taken off of her BP medications and her hemoglobin had dropped to a 6. I scheduled her for 04/25/23 with Carlos Levering but patient requested to keep appt on 01/27 with Dr. Mariah Milling - [place] Childes.

## 2023-04-16 LAB — ANCA TITERS
Atypical P-ANCA titer: <1:20 {titer}
C-ANCA: <1:20 {titer}
P-ANCA: 1:640 {titer} — ABNORMAL HIGH

## 2023-04-18 LAB — PROTEIN ELECTROPHORESIS, SERUM
A/G Ratio: 1.1 (ref 0.7–1.7)
Albumin ELP: 3.4 g/dL (ref 2.9–4.4)
Alpha-1-Globulin: 0.3 g/dL (ref 0.0–0.4)
Alpha-2-Globulin: 0.8 g/dL (ref 0.4–1.0)
Beta Globulin: 0.8 g/dL (ref 0.7–1.3)
Gamma Globulin: 1.1 g/dL (ref 0.4–1.8)
Globulin, Total: 3 g/dL (ref 2.2–3.9)
Total Protein ELP: 6.4 g/dL (ref 6.0–8.5)

## 2023-04-19 DIAGNOSIS — I5030 Unspecified diastolic (congestive) heart failure: Secondary | ICD-10-CM | POA: Diagnosis not present

## 2023-04-19 DIAGNOSIS — I130 Hypertensive heart and chronic kidney disease with heart failure and stage 1 through stage 4 chronic kidney disease, or unspecified chronic kidney disease: Secondary | ICD-10-CM | POA: Diagnosis not present

## 2023-04-19 DIAGNOSIS — E785 Hyperlipidemia, unspecified: Secondary | ICD-10-CM | POA: Diagnosis not present

## 2023-04-19 DIAGNOSIS — E039 Hypothyroidism, unspecified: Secondary | ICD-10-CM | POA: Diagnosis not present

## 2023-04-19 DIAGNOSIS — Z1389 Encounter for screening for other disorder: Secondary | ICD-10-CM | POA: Diagnosis not present

## 2023-04-19 DIAGNOSIS — D649 Anemia, unspecified: Secondary | ICD-10-CM | POA: Diagnosis not present

## 2023-04-19 DIAGNOSIS — N184 Chronic kidney disease, stage 4 (severe): Secondary | ICD-10-CM | POA: Diagnosis not present

## 2023-04-22 NOTE — Progress Notes (Signed)
 Cardiology Clinic Note   Date: 04/25/2023 ID: Jkayla F Kaminski, DOB Jul 15, 1945, MRN [id number]  Primary Cardiologist:  Evalene Lunger, MD  Patient Profile    Lynn Joseph is a 77 y.o. female who presents to the clinic today for hospital follow up.     Past medical history significant for: Palpitations/PAF. 14 day Zio 01/15/2022: HR 58 to 171 bpm, average 60 bpm.  2 runs of NSVT, fastest/longest 4 beats max rate 132 bpm.  9 runs of SVT, fastest 4 beats 171 bpm, longest 11 beats average 219 bpm.  Occasional PACs (1.1%) rare PVCs. Onset November 2023.  Echo 03/09/2022: EF 55-60%. No RWMA. Indeterminate diastolic parameters. Normal RV size/function. Normal PA pressure, RVSP 15.8 mmHg. Trivial MR. Aortic valve sclerosis without stenosis.  Chest pain. Nuclear stress test 01/11/2022: Low risk scan. No significant ischemia. Normal wall motion. No EKG changes concerning for ischemia during peak stress or in recovery. CT attenuation images with no significant coronary calcification, minimal aortic atherosclerosis.  Carotid artery stenosis. Carotid duplex 02/09/2020: Right ICA 1 to 39%.  Left ICA 40 to 59%. Hypertension.  Renal artery stenosis.  Renal artery US  05/01/2022: Right: Abnormal size right kidney and right resistive index. Unable to accurately identify right renal artery, possible occlusion. Left: Normal size kidney. Abnormal left resistive index. Normal cortical thickness of the left kidney. No evidence of left renal artery stenosis. Patent IVC.  Renal angiography 06/20/2022: Normal left renal artery. Mild nonobstructive right renal artery stenosis that does not require revascularization.  OSA.  Hypothyroidism.  CKD stage IIIb.  Roux-en-Y gastric bypass.  Chronic anemia.   In summary, was first evaluated by Dr. Gollan on 12/22/2021. She reported a history of CHF. Past echo in June 2017 showed normal LV function, EF 60-65% with mild MR. Repeat echo in July 2020 showed EF 55-60% with  trivial MR. At that time of her visit she complained of chest pain and palpitations. She underwent nuclear stress testing which was a low risk study as detailed above. 14 day Zio as detailed above.  In November 2023, she underwent hospital admission for new onset afib. She had recently been seen in the ED in [place]  for cholecystitis and had a perc drain placed. She returned to [facility] and was scheduled to see a surgeon when she felt fluttering in her chest and her Apple watch alerted for afib. Echo showed normal LV/RV function as detailed above. She was started on Eliquis  and lopressor . She was seen in follow up on January 2024 and noted to have elevated BP at the time of her visit and with logged home readings. Medications were adjusted and she underwent renal US  which showed renal artery stenosis. She was referred to Dr. Darron who performed a renal angiography and determined revascularization was not required (details above).      History of Present Illness    Lynn Joseph is followed by Dr. Gollan for the above outlined history.   Patient was last seen in the office on 10/23/2022 for routine follow up. She requested a change from Metoprolol  to Bystolic  for better BP control. She did not want to take carvedilol , as it caused fatigue in the past.   Patient underwent 2 hospital admission in December for anemia requiring blood transfusion.  Admission 12/7-12/8 for progressive shortness of breath x 2 weeks. Initial labs:  Hemoglobin 6.1, HCT 20.1, WBC 6.1, sodium 140, potassium 3.8, CRT 2.51, BUN 54, eGFR 19, BUN 311.9. Hemoccult negative. Troponin negative. Chest xray showed left  base consolidation and effusion, Mild vascular congestion. She received IV abx for pneumonia and was transfused 1 unit PRBC. She was in rate controlled afib. She held Eliquis  for 1 day.   Admission 12/17-12/22 for anemia. Initial labs: WBC 5.0, hemoglobin 7, sodium 138, potassium 4, CRT 3.41, BUN 60, eGFR 13, TSH 5.835, hemoccult  positive. Maintaining sinus rhythm. Transfused 2 units PRBC. Multiple myeloma was ruled out per nephrology. Patient opted to have EGD/colonoscopy as an outpatient. She was discharged on 12/22 with plan to follow up with nephrology, oncology, and GI. She was instructed to hold Eliquis  until told to resume by GI or PCP. She was told to stop Bystolic  and hydralazine .   Today, patient  is here alone. Her daughter, Glade, joins the visit via telephone. She has a visit with GI tomorrow. She denies blood in stool or urine. She continues to have some DOE and fatigue. No orthopnea or PND. She reports mild left chest pain with deep inspiration. Home BP has been running 145-150s. She reports mild bilateral ankle edema that is stable for her. Weight is stable. Activity has been limited recently secondary to recent hospitalizations. She does stay active running a yorkie rescue and performing light to moderate household activities. She continues to hold all medications including Eliquis .       ROS: All other systems reviewed and are otherwise negative except as noted in History of Present Illness.  EKGs/Labs Reviewed    EKG Interpretation Date/Time:  Thursday April 25 2023 08:40:29 EST Ventricular Rate:  71 PR Interval:  154 QRS Duration:  78 QT Interval:  402 QTC Calculation: 436 R Axis:   20  Text Interpretation: Normal sinus rhythm Normal ECG When compared with ECG of 09-Apr-2023 18:09, PREVIOUS ECG IS PRESENT Confirmed by Loistine Sober [phone]) on 04/25/2023 8:45:11 AM   04/09/2023: ALT 16; AST 26 04/14/2023: BUN 43; Creatinine, Ser 2.28; Potassium 3.6; Sodium 139   04/14/2023: Hemoglobin 7.9; WBC 5.8   04/11/2023: TSH 4.193   03/30/2023: B Natriuretic Peptide 311.9   Risk Assessment/Calculations     CHA2DS2-VASc Score = 4   This indicates a 4.8% annual risk of stroke. The patient's score is based upon: CHF History: 0 HTN History: 1 Diabetes History: 0 Stroke History: 0 Vascular  Disease History: 0 Age Score: 2 Gender Score: 1    HYPERTENSION CONTROL Vitals:   04/25/23 0832 04/25/23 1038  BP: (!) 162/80 (!) 160/82    The patient's blood pressure is elevated above target today.  In order to address the patient's elevated BP: A current anti-hypertensive medication was adjusted today.           Physical Exam    VS:  BP (!) 160/82 (BP Location: Left Arm, Patient Position: Sitting, Cuff Size: Normal)   Pulse 71   Ht 5' 5 (1.651 m)   Wt 216 lb 4 oz (98.1 kg)   BMI 35.99 kg/m  , BMI Body mass index is 35.99 kg/m.  GEN: Well nourished, well developed, in no acute distress. Neck: No JVD or carotid bruits. Cardiac:  RRR. No murmurs. No rubs or gallops.   Respiratory:  Respirations regular and unlabored. Clear to auscultation without rales, wheezing or rhonchi. GI: Soft, nontender, nondistended. Extremities: Radials/DP/PT 2+ and equal bilaterally. No clubbing or cyanosis. Mild, nonpitting edema bilateral ankles.   Skin: Warm and dry, no rash. Neuro: Strength intact.  Assessment & Plan   Palpitations/PAF Onset of A-fib November 2023.  Patient was noted to be in rate controlled  afib during hospital admission on 12/7. She was heme positive during second hospital admission on 12/17. She denies palpitations.Has not noticed blood in stool or urine. She is in sinus rhythm today. She will continue to hold Eliquis  until seen by GI. She was told to hold Bystolic  when she was in the hospital. Discussed restarting it but she is hesitant to do so.  -CBC and BMP today.  -Consider resuming Bystolic . -Continue to hold Eliquis  until seen by GI.   Hypertension BP today 162/80 on intake and 160/82 on my recheck. Home BP has 145-150s. She has not been taking any of her medications per hospital discharge instructions.  -Restart hydralazine  50 mg up to 3 times a day for SBP >150.  -Consider resuming Bystolic .   CKD Last creatinine 2.28 on 12/22. Patient will be seen by  nephrology as an outpatient.  -Follow up with nephrology.  -BMP today.   GI bleed/anemia Patient heme positive on 12/17. Hemoglobin as low as 6.1 in early December. She was transfused a total of 3 units PRBC in December. Last HGB 7.9 on 12/22. She denies noticing blood in stool or urine. She has GI visit tomorrow with plan to schedule EGD/colonoscopy. She is having some left sided chest pain with deep inspiration that sounds pleuritic in nature. No other concerning symptoms.  -She will not need further cardiac testing prior to EGD/colonoscopy. She is already holding Eliquis  and should resume when GI feels it is safe to do so.   Disposition: CBC and BMP today. Resume hydralazine  50 mg up to 3 times a day for SBP >150. Return for previously scheduled visit with Dr. Gollan on 1/27 or sooner as needed.          Signed, Barnie HERO. Beecher Furio, DNP, NP-C

## 2023-04-25 ENCOUNTER — Encounter: Payer: Self-pay | Admitting: Student

## 2023-04-25 ENCOUNTER — Ambulatory Visit: Payer: Medicare Other | Attending: Student | Admitting: Student

## 2023-04-25 VITALS — BP 160/82 | HR 71 | Ht 65.0 in | Wt 216.2 lb

## 2023-04-25 DIAGNOSIS — D649 Anemia, unspecified: Secondary | ICD-10-CM

## 2023-04-25 DIAGNOSIS — R079 Chest pain, unspecified: Secondary | ICD-10-CM | POA: Diagnosis not present

## 2023-04-25 DIAGNOSIS — Z79899 Other long term (current) drug therapy: Secondary | ICD-10-CM

## 2023-04-25 DIAGNOSIS — I480 Paroxysmal atrial fibrillation: Secondary | ICD-10-CM | POA: Diagnosis not present

## 2023-04-25 DIAGNOSIS — R002 Palpitations: Secondary | ICD-10-CM | POA: Diagnosis present

## 2023-04-25 DIAGNOSIS — K922 Gastrointestinal hemorrhage, unspecified: Secondary | ICD-10-CM

## 2023-04-25 DIAGNOSIS — N1832 Chronic kidney disease, stage 3b: Secondary | ICD-10-CM

## 2023-04-25 DIAGNOSIS — I10 Essential (primary) hypertension: Secondary | ICD-10-CM

## 2023-04-25 MED ORDER — HYDRALAZINE HCL 100 MG PO TABS: 50.0000 mg | ORAL_TABLET | Freq: Three times a day (TID) | ORAL | Status: DC | PRN

## 2023-04-25 NOTE — Patient Instructions (Signed)
 Medication Instructions:  Your physician recommends the following medication changes.  START TAKING: Hydralazine  50 mg three times daily as needed for systolic BP greater than 150  *If you need a refill on your cardiac medications before your next appointment, please call your pharmacy*   Lab Work: Your provider would like for you to have following labs drawn today CBC and BMT.   If you have labs (blood work) drawn today and your tests are completely normal, you will receive your results only by: MyChart Message (if you have MyChart) OR A paper copy in the mail If you have any lab test that is abnormal or we need to change your treatment, we will call you to review the results.   Testing/Procedures: None ordered at this time    Follow-Up: At [facility], you and your health needs are our priority.  As part of our continuing mission to provide you with exceptional heart care, we have created designated Provider Care Teams.  These Care Teams include your primary Cardiologist (physician) and Advanced Practice Providers (APPs -  Physician Assistants and Nurse Practitioners) who all work together to provide you with the care you need, when you need it.  We recommend signing up for the patient portal called MyChart.  Sign up information is provided on this After Visit Summary.  MyChart is used to connect with patients for Virtual Visits (Telemedicine).  Patients are able to view lab/test results, encounter notes, upcoming appointments, etc.  Non-urgent messages can be sent to your provider as well.   To learn more about what you can do with MyChart, go to [email or web address].    Your next appointment:   With Dr Gollan

## 2023-04-26 DIAGNOSIS — D649 Anemia, unspecified: Secondary | ICD-10-CM | POA: Diagnosis not present

## 2023-04-26 DIAGNOSIS — R195 Other fecal abnormalities: Secondary | ICD-10-CM | POA: Diagnosis not present

## 2023-04-26 LAB — BASIC METABOLIC PANEL
BUN/Creatinine Ratio: 15 (ref 12–28)
BUN: 34 mg/dL — ABNORMAL HIGH (ref 8–27)
CO2: 21 mmol/L (ref 20–29)
Calcium: 7.6 mg/dL — ABNORMAL LOW (ref 8.7–10.3)
Chloride: 108 mmol/L — ABNORMAL HIGH (ref 96–106)
Creatinine, Ser: 2.25 mg/dL — ABNORMAL HIGH (ref 0.57–1.00)
Glucose: 69 mg/dL — ABNORMAL LOW (ref 70–99)
Potassium: 4.9 mmol/L (ref 3.5–5.2)
Sodium: 145 mmol/L — ABNORMAL HIGH (ref 134–144)
eGFR: 22 mL/min/{1.73_m2} — ABNORMAL LOW (ref >59)

## 2023-04-26 LAB — CBC
Hematocrit: 27.6 % — ABNORMAL LOW (ref 34.0–46.6)
Hemoglobin: 8.5 g/dL — ABNORMAL LOW (ref 11.1–15.9)
MCH: 28.6 pg (ref 26.6–33.0)
MCHC: 30.8 g/dL — ABNORMAL LOW (ref 31.5–35.7)
MCV: 93 fL (ref 79–97)
Platelets: 184 10*3/uL (ref 150–450)
RBC: 2.97 x10E6/uL — ABNORMAL LOW (ref 3.77–5.28)
RDW: 15.4 % (ref 11.7–15.4)
WBC: 6.3 10*3/uL (ref 3.4–10.8)

## 2023-04-28 ENCOUNTER — Telehealth: Payer: Self-pay | Admitting: Student

## 2023-04-28 NOTE — Telephone Encounter (Addendum)
    The patient called the after-hours line as she reports feeling bad on Friday and checked her blood pressure at that time following her office visit and it was elevated with SBP in the 200's.  She did take Hydralazine  and felt better throughout the day yesterday but reports feeling bad again today. Her SBP was elevated in the 160's to 170's and she took Hydralazine  with some improvement. She did check her rhythm on her iWatch and reports this told her she was in atrial fibrillation with HR in the 120's to 140's. She had not yet resumed Bystolic  and given her BP at 172/115 on most recent check and heart rate in the 120's, I did recommend that she take a dose of Bystolic  now. Encouraged her to check her vitals within the next 1-2 hours. If heart remains significantly elevated, we did review that she should proceed to the Emergency Department for further evaluation  If heart rate and BP improve, may need to adjust her baseline medications. Unfortunately, she has been off Eliquis  due to anemia as discussed during her recent office visit and was scheduled for an EGD tomorrow. We reviewed that if her HR remains elevated, this would likely be canceled. She would also not be a candidate for DCCV while in the ED due to being off anticoagulation and will likely need to focus on rate control for now. I encouraged her to call back for any questions or concerns regarding her follow-up BP/HR.   Signed, Laymon CHRISTELLA Qua, PA-C 04/28/2023, 12:45 PM   Addendum: The patient called back to report that she felt significantly better and her heart rate had improved to the 70's. iWatch reported she was out of afib. BP was still elevated at 158/113 but she took Bystolic  approximately 2 hours ago. We reviewed that this can take longer to work as well and given that she feels better, I encouraged her to check her blood pressure later this evening and if SBP is greater than 130 and heart rate is greater than 50, would take an  additional Bystolic  20 mg. She was previously on 20 mg twice daily and would plan to resume this. Would likely avoid PRN Hydralazine  for now since this appears to be causing rebound hypertension and she is not tolerating it well. May need to add back in the future at a lower dose and gradually titrate over time.  Signed, Laymon CHRISTELLA Qua, PA-C 04/28/2023, 4:00 PM Pager: [phone]

## 2023-04-29 NOTE — Telephone Encounter (Signed)
 Patient calling back for update.   Also wanted to say that she having trouble with the   Pt c/o medication issue:  1. Name of Medication: hydrALAZINE  (APRESOLINE ) 100 MG tablet   2. How are you currently taking this medication (dosage and times per day)? M   3. Are you having a reaction (difficulty breathing--STAT)?    4. What is your medication issue? Patient is still having trouble with this medication after only taking 50mg . Please advise

## 2023-04-29 NOTE — Telephone Encounter (Signed)
 Pt called in the AM and again in the pm with advice from Dr Perla  AM pt (and daughter Junella) reports holding blood thinners for procedure (pt was hospitalized for anemia) Friday BP 190/103 pt took hydralazine  50 mg the BP 145/73  Saturday good  Sunday 160/85 -> hydralazine  25 mg seems to have triggered Afib 120-140 rate recorded, pt reports  taking Bystolic  in the am and pm  Today BP 190/87 HR 72 am and then BP 157/90 HR 68  Pt reports that's she's normally systolic 135-160 without taking anything  Callback in the pm with Dr Perla advice to hold hydralazine  and Eliquis  and take Bystolic  20 mg BID

## 2023-05-02 ENCOUNTER — Encounter: Payer: Self-pay | Admitting: Cardiology

## 2023-05-02 ENCOUNTER — Ambulatory Visit: Payer: Medicare Other | Attending: Cardiology | Admitting: Cardiology

## 2023-05-02 VITALS — BP 191/82 | HR 65 | Ht 65.0 in | Wt 215.0 lb

## 2023-05-02 DIAGNOSIS — I480 Paroxysmal atrial fibrillation: Secondary | ICD-10-CM | POA: Diagnosis not present

## 2023-05-02 DIAGNOSIS — D649 Anemia, unspecified: Secondary | ICD-10-CM | POA: Insufficient documentation

## 2023-05-02 DIAGNOSIS — K922 Gastrointestinal hemorrhage, unspecified: Secondary | ICD-10-CM | POA: Insufficient documentation

## 2023-05-02 DIAGNOSIS — N1832 Chronic kidney disease, stage 3b: Secondary | ICD-10-CM | POA: Insufficient documentation

## 2023-05-02 DIAGNOSIS — I10 Essential (primary) hypertension: Secondary | ICD-10-CM | POA: Insufficient documentation

## 2023-05-02 DIAGNOSIS — R002 Palpitations: Secondary | ICD-10-CM | POA: Insufficient documentation

## 2023-05-02 MED ORDER — HYDRALAZINE HCL 25 MG PO TABS: 25.0000 mg | ORAL_TABLET | Freq: Every day | ORAL | Status: DC

## 2023-05-02 NOTE — Patient Instructions (Signed)
 Medication Instructions:   hydrALAZINE  (APRESOLINE ) 25 mg daily  *If you need a refill on your cardiac medications before your next appointment, please call your pharmacy*  Lab Work: Your provider would like for you to have following labs drawn today CBC & BMET.   If you have labs (blood work) drawn today and your tests are completely normal, you will receive your results only by: MyChart Message (if you have MyChart) OR A paper copy in the mail If you have any lab test that is abnormal or we need to change your treatment, we will call you to review the results.  Testing/Procedures: - None ordered  Follow-Up: At [facility], you and your health needs are our priority.  As part of our continuing mission to provide you with exceptional heart care, we have created designated Provider Care Teams.  These Care Teams include your primary Cardiologist (physician) and Advanced Practice Providers (APPs -  Physician Assistants and Nurse Practitioners) who all work together to provide you with the care you need, when you need it.  Your next appointment:   4 week(s)  Provider:   Barnie Hila, NP

## 2023-05-02 NOTE — Progress Notes (Signed)
 Cardiology Office Note:  .   Date:  05/06/2023  ID:  Lynn Joseph, DOB 1946/03/10, MRN [id number] PCP: Larnell Hamilton, MD  [facility] Providers Cardiologist:  Evalene Lunger, MD    History of Present Illness: .   Lynn Joseph is a 78 y.o. female with a past medical history of hypertension, paroxysmal atrial fibrillation on chronic anticoagulation with apixaban , CKD, hypothyroidism, who presents today for follow-up and preprocedure clearance.   First evaluated by Dr. Gollan on 12/22/2021. She reported a history of CHF. Past echo in June 2017 showed normal LV function, EF 60-65% with mild MR. Repeat echo in July 2020 showed EF 55-60% with trivial MR. At that time of her visit she complained of chest pain and palpitations. She underwent nuclear stress testing which was a low risk study as detailed above. 14 day Zio as detailed above.  In November 2023, she underwent hospital admission for new onset afib. She had recently been seen in the ED in [place]  for cholecystitis and had a perc drain placed. She returned to [facility] and was scheduled to see a surgeon when she felt fluttering in her chest and her Apple watch alerted for afib. Echo showed normal LV/RV function as detailed above. She was started on Eliquis  and lopressor . She was seen in follow up on January 2024 and noted to have elevated BP at the time of her visit and with logged home readings. Medications were adjusted and she underwent renal US  which showed renal artery stenosis. She was referred to Dr. Darron who performed a renal angiography and determined revascularization was not required.   She had 2 hospitalizations in December 2024 for anemia requiring blood transfusion.  She had been sent 12/712/8 with progressive shortness of breath x 2 weeks.  Initial labs revealed hemoglobin of 6.1.  Hemoccult was negative, troponin was negative, chest x-ray showed left base consolidation and effusion with mild vascular congestion.  She received  IV antibiotics for pneumonia and was transfused 1 unit of PRBCs.  She did remain in atrial fibrillation that was rate controlled and her apixaban  was held for 1 day.  Subsequent mission 12/17-12/22-hemoglobin of 7 but Hemoccult was positive.  She was maintaining sinus rhythm.  She was transfused 2 units of PRBCs.  Multiple myeloma was ruled out by nephrology.  The patient opted to have EGD/colonoscopy as an outpatient.  She was discharged on 12/22 plan follow-up with nephrology, oncology, GI.  She was instructed to hold her apixaban  until told to resume by GI or PCP.  She was also told to stop her Bystolic  and hydralazine .    She was last seen in clinic 04/24/2018 by Dr.  She feels with GI pathology.  She denies any blood in her stool or urine.  She continued to have some DOE and fatigue.  Blood pressures running 145-150's at home.  She was hesitant to restart her Bystolic  but was agreeable to restart hydralazine  50 mg up to 3 times per day for systolic blood pressure greater than 150.  She was also cleared for GI procedure without the need for any further cardiac testing.  Patient called in to the triage line after hours  04/28/23 reported feeling bad.  Stated she checked her blood pressure at home and her blood pressure was elevated with the 200s.  States that she had taken a translator but felt better throughout the day yesterday but reported feeling bad again today.  Systolic blood pressure remained elevated in the 160s to 170s she  took the hydralazine  with some improvement.  She checked her rhythm with her iWatch reported on her she was back in atrial fibrillation with heart rates of 120-140.  She had not yet resumed her Bystolic  and given her blood pressure 176/115 on most recent check she was recommended to take a dose of her Bystolic  at that time.  She was getting ER precautions and encouraged to call back for any questions or concerns following up on her blood pressure and heart rate.  Patient's daughter  called back again later in the day with concerns of blood pressure and overall feeling back.  Dr. Gollan's recommended holding hydralazine  and apixaban  and take Bystolic  20 mg twice daily.  She returns to clinic today stating that overall she has been feeling fairly well.  She had noticed that her blood pressure has been elevated and that she has been in and out of atrial fibrillation more frequently.  When she has been in A-fib but she was extremely symptomatic.  During recent hospitalization she states that all of her medications were held and she had not had any of her blood pressure medications.  She said that she had felt worse prior to when she had taken the hydralazine  since she had stopped taking it and was only taken about systolic as directed.  She had been back on diastolic for a couple of days prior to her visit.  Blood pressure continued to be elevated.  She has not had any further doses of hydralazine  because it was making her feel badly thought that it was prompting her atrial fibrillation.  With her concern for elevated kidney function during recent hospitalization blood pressure medications were limited.  She does have an upcoming appointment as an outpatient with nephrology for evaluation of her kidneys.  She denies any other associated symptoms.  ROS: 10 point review of systems has been reviewed and considered negative with exception what is been listed in the HPI  Studies Reviewed: SABRA   EKG Interpretation Date/Time:  Thursday May 02 2023 13:37:31 EST Ventricular Rate:  65 PR Interval:  150 QRS Duration:  80 QT Interval:  428 QTC Calculation: 445 R Axis:   30  Text Interpretation: Normal sinus rhythm with sinus arrhythmia Normal ECG When compared with ECG of 25-Apr-2023 08:40, No significant change was found Confirmed by Gerard Frederick ([id number]) on 05/02/2023 1:41:05 PM    Renal Angiography 1.  Normal left renal artery with mild nonobstructive disease affecting the right renal  artery.   Recommendations: No significant renal artery stenosis that requires revascularization.  Continue medical therapy. 40 mL of contrast was used.  2D echo 03/09/2022 1. Left ventricular ejection fraction, by estimation, is 55 to 60%. The  left ventricle has normal function. The left ventricle has no regional  wall motion abnormalities. Left ventricular diastolic function could not  be evaluated.   2. Right ventricular systolic function is normal. The right ventricular  size is normal. There is normal pulmonary artery systolic pressure. The  estimated right ventricular systolic pressure is 15.8 mmHg.   3. The mitral valve is grossly normal. Trivial mitral valve  regurgitation. No evidence of mitral stenosis.   4. The aortic valve is tricuspid. There is mild calcification of the  aortic valve. Aortic valve regurgitation is not visualized. Aortic valve  sclerosis is present, with no evidence of aortic valve stenosis.   5. The inferior vena cava is normal in size with greater than 50%  respiratory variability, suggesting right atrial pressure of  3 mmHg.   Lexiscan  Myoview  01/11/2022 Pharmacological myocardial perfusion imaging study with no significant  ischemia Normal wall motion, EF estimated at 59% No EKG changes concerning for ischemia at peak stress or in recovery. CT attenuation correction images with no significant coronary calcification, minimal aortic atherosclerosis Low risk scan  Risk Assessment/Calculations:    CHA2DS2-VASc Score = 4   This indicates a 4.8% annual risk of stroke. The patient's score is based upon: CHF History: 0 HTN History: 1 Diabetes History: 0 Stroke History: 0 Vascular Disease History: 0 Age Score: 2 Gender Score: 1         Physical Exam:   VS:  BP (!) 191/82 (BP Location: Left Arm, Patient Position: Sitting)   Pulse 65   Ht 5' 5 (1.651 m)   Wt 215 lb (97.5 kg)   SpO2 96%   BMI 35.78 kg/m    Wt Readings from Last 3 Encounters:   05/02/23 215 lb (97.5 kg)  04/25/23 216 lb 4 oz (98.1 kg)  04/14/23 235 lb 10.8 oz (106.9 kg)    GEN: Well nourished, well developed in no acute distress NECK: No JVD; No carotid bruits CARDIAC: RRR, no murmurs, rubs, gallops RESPIRATORY:  Clear to auscultation without rales, wheezing or rhonchi  ABDOMEN: Soft, non-tender, non-distended EXTREMITIES: Trace pretibial edema; No deformity   ASSESSMENT AND PLAN: .   Hypertension with blood pressure 191/82 today on recheck.  Home blood pressures have been running 170s 180s.  She had not been taking any of her medications per discharge summary as the patient stated to the nurse at the hospital in penciled in for her only to take her blood pressure was greater than 160 systolic.  She called the nurse line with concerns of atrial fibrillation and hypertensive and was advised to restart her Bystolic  20 mg twice daily.  She previously had her last visit but restarted on hydralazine  50 mg up to 3 times a day for systolic blood pressure greater than 150 but stated when she was taking it it sparked her A-fib which felt worse so she is not taking any of breath on sleep.  After further evaluation of her chart her kidney function was elevated in the emergency department she had an outpatient follow-up that was upcoming with nephrology.  She was being sent for CBC and a BMP today to reevaluate her anemia and kidney function.  With last serum creatinine 2.28.  No new medications were able to be started today but she has been encouraged to start back on hydralazine  25 mg at lunch and between her Bystolic  to assist with lowering her blood pressure and to determine if that was what was making her feel ill.  She has been encouraged to continue to monitor her blood pressure 1 to 2 hours postmedication administration.  Paroxysmal atrial fibrillation with palpitations when she had new onset A-fib) in 2023.  She was noted to be rate controlled atrial fibrillation during  hospital admission on 03/30/2023.  She was heme positive during second hospital admission on 04/09/2023.  Continues to deny any palpitations but feels unwell when she is in atrial fibrillation.  EKG today reveals sinus rhythm with sinus arrhythmia with a rate of 65.  She has been sent for follow-up labs today.  Continued on Bystolic  20 mg twice daily.  She continues to hold apixaban  until her colonoscopy and endoscopy is completed to determine where the bleeding was coming from for CHA2DS2-VASc score of 4.  Once she is completed her GI  workup will determine at that time if OAC would be able to be restarted if may need referral to EP for consideration of Watchman procedure and short-term OAC.  CKD was last admitted in 2.2 on 04/25/23.  She has outpatient appointment with nephrology as outpatient that is upcoming.  She is being sent for an updated BMP today to reevaluate kidney function.  To complete anemia where she was heme positive on 04/09/2023.  Hemoglobin had remained low with as low as 6.1 in early December and she been transfused 3 units of PRBCs in December.  Last hemoglobin of 8.5 on 04/25/2023.  She denies noticing blood in her stool or urine.  She denies any chest pain or shortness of breath.  She does not need any further cardiac testing prior to EGD and colonoscopy.  She has already been holding her apixaban  and should resume when GI feels safe to do so.        Dispo: Patient return to see MD/APP at the beginning of next week for reevaluation of blood pressure after medication changes have been made today.  Signed, Taytum Wheller, NP

## 2023-05-03 LAB — CBC
Hematocrit: 27.9 % — ABNORMAL LOW (ref 34.0–46.6)
Hemoglobin: 8.9 g/dL — ABNORMAL LOW (ref 11.1–15.9)
MCH: 28.3 pg (ref 26.6–33.0)
MCHC: 31.9 g/dL (ref 31.5–35.7)
MCV: 89 fL (ref 79–97)
Platelets: 236 10*3/uL (ref 150–450)
RBC: 3.14 x10E6/uL — ABNORMAL LOW (ref 3.77–5.28)
RDW: 15 % (ref 11.7–15.4)
WBC: 6.2 10*3/uL (ref 3.4–10.8)

## 2023-05-03 LAB — BASIC METABOLIC PANEL
BUN/Creatinine Ratio: 16 (ref 12–28)
BUN: 29 mg/dL — ABNORMAL HIGH (ref 8–27)
CO2: 19 mmol/L — ABNORMAL LOW (ref 20–29)
Calcium: 7.9 mg/dL — ABNORMAL LOW (ref 8.7–10.3)
Chloride: 109 mmol/L — ABNORMAL HIGH (ref 96–106)
Creatinine, Ser: 1.84 mg/dL — ABNORMAL HIGH (ref 0.57–1.00)
Glucose: 79 mg/dL (ref 70–99)
Potassium: 5 mmol/L (ref 3.5–5.2)
Sodium: 144 mmol/L (ref 134–144)
eGFR: 28 mL/min/{1.73_m2} — ABNORMAL LOW (ref >59)

## 2023-05-06 ENCOUNTER — Encounter: Payer: Self-pay | Admitting: Cardiology

## 2023-05-06 ENCOUNTER — Telehealth: Payer: Self-pay | Admitting: Cardiology

## 2023-05-06 ENCOUNTER — Ambulatory Visit: Payer: Medicare Other | Attending: Cardiology | Admitting: Cardiology

## 2023-05-06 ENCOUNTER — Ambulatory Visit
Admission: RE | Admit: 2023-05-06 | Discharge: 2023-05-06 | Disposition: A | Discharge status: Home / Self Care | Payer: Medicare Other | Source: Ambulatory Visit | Attending: Cardiology | Admitting: Cardiology

## 2023-05-06 VITALS — BP 140/70 | HR 70 | Ht 65.0 in | Wt 218.1 lb

## 2023-05-06 DIAGNOSIS — R079 Chest pain, unspecified: Secondary | ICD-10-CM | POA: Diagnosis not present

## 2023-05-06 DIAGNOSIS — I10 Essential (primary) hypertension: Secondary | ICD-10-CM | POA: Diagnosis not present

## 2023-05-06 DIAGNOSIS — K922 Gastrointestinal hemorrhage, unspecified: Secondary | ICD-10-CM | POA: Diagnosis not present

## 2023-05-06 DIAGNOSIS — R002 Palpitations: Secondary | ICD-10-CM | POA: Diagnosis not present

## 2023-05-06 DIAGNOSIS — R0602 Shortness of breath: Secondary | ICD-10-CM | POA: Insufficient documentation

## 2023-05-06 DIAGNOSIS — R918 Other nonspecific abnormal finding of lung field: Secondary | ICD-10-CM | POA: Diagnosis not present

## 2023-05-06 DIAGNOSIS — N1832 Chronic kidney disease, stage 3b: Secondary | ICD-10-CM | POA: Insufficient documentation

## 2023-05-06 DIAGNOSIS — I480 Paroxysmal atrial fibrillation: Secondary | ICD-10-CM | POA: Diagnosis not present

## 2023-05-06 DIAGNOSIS — D649 Anemia, unspecified: Secondary | ICD-10-CM | POA: Insufficient documentation

## 2023-05-06 DIAGNOSIS — R0789 Other chest pain: Secondary | ICD-10-CM | POA: Diagnosis not present

## 2023-05-06 MED ORDER — DOXAZOSIN MESYLATE 1 MG PO TABS: 1.0000 mg | ORAL_TABLET | Freq: Every day | ORAL | 3 refills | Status: DC

## 2023-05-06 NOTE — Telephone Encounter (Signed)
 Patient stopped by after getting xray to check for infection. She stated she was told it would take 1-2 weeks to get results/reading due to shortage and is concerned of that length of time to get results if infections is present.

## 2023-05-06 NOTE — Patient Instructions (Signed)
 Medication Instructions:  Your physician has recommended you make the following change in your medication:   START Cardura  1 mg once daily  STOP Hydralazine  TAKE Furosemide  (Lasix ) 20 mg for 2 days   *If you need a refill on your cardiac medications before your next appointment, please call your pharmacy*   Lab Work: None  If you have labs (blood work) drawn today and your tests are completely normal, you will receive your results only by: MyChart Message (if you have MyChart) OR A paper copy in the mail If you have any lab test that is abnormal or we need to change your treatment, we will call you to review the results.   Testing/Procedures: Chest xray to be done over at the [facility] entrance and stop at registration desk.   Your physician has requested that you have an echocardiogram. Echocardiography is a painless test that uses sound waves to create images of your heart. It provides your doctor with information about the size and shape of your heart and how well your heart's chambers and valves are working. This procedure takes approximately one hour. There are no restrictions for this procedure. Please do NOT wear cologne, perfume, aftershave, or lotions (deodorant is allowed). Please arrive 15 minutes prior to your appointment time.  Please note: We ask at that you not bring children with you during ultrasound (echo/ vascular) testing. Due to room size and safety concerns, children are not allowed in the ultrasound rooms during exams. Our front office staff cannot provide observation of children in our lobby area while testing is being conducted. An adult accompanying a patient to their appointment will only be allowed in the ultrasound room at the discretion of the ultrasound technician under special circumstances. We apologize for any inconvenience.    Follow-Up: At [facility], you and your health needs are our priority.  As part of our continuing mission  to provide you with exceptional heart care, we have created designated Provider Care Teams.  These Care Teams include your primary Cardiologist (physician) and Advanced Practice Providers (APPs -  Physician Assistants and Nurse Practitioners) who all work together to provide you with the care you need, when you need it.   Your next appointment:   05/20/23 at 10:20 am with Dr. Gollan

## 2023-05-06 NOTE — Progress Notes (Signed)
Results already discussed with patient.

## 2023-05-06 NOTE — Telephone Encounter (Signed)
 I was not aware that the turn around time for results was 1-2 weeks on a chest film but I will reach out and see.

## 2023-05-06 NOTE — Progress Notes (Addendum)
 Cardiology Office Note:  .   Date:  05/06/2023  ID:  Lynn Joseph, DOB 1945-10-07, MRN [id number] PCP: Larnell Hamilton, MD  [facility] Providers Cardiologist:  Evalene Lunger, MD    History of Present Illness: .   Lynn Joseph is a 78 y.o. female with a past medical for hypertension, paroxysmal atrial fibrillation, CKD, hypothyroidism, palpitations, anemia, GI bleed, who presents today for follow-up on her hypertension.   First evaluated by Dr. Gollan on 12/22/2021. She reported a history of CHF. Past echo in June 2017 showed normal LV function, EF 60-65% with mild MR. Repeat echo in July 2020 showed EF 55-60% with trivial MR. At that time of her visit she complained of chest pain and palpitations. She underwent nuclear stress testing which was a low risk study as detailed above. 14 day Zio as detailed above.  In November 2023, she underwent hospital admission for new onset afib. She had recently been seen in the ED in [place]  for cholecystitis and had a perc drain placed. She returned to [facility] and was scheduled to see a surgeon when she felt fluttering in her chest and her Apple watch alerted for afib. Echo showed normal LV/RV function as detailed above. She was started on Eliquis  and lopressor . She was seen in follow up on January 2024 and noted to have elevated BP at the time of her visit and with logged home readings. Medications were adjusted and she underwent renal US  which showed renal artery stenosis. She was referred to Dr. Darron who performed a renal angiography and determined revascularization was not required.   She had 2 hospitalizations in December 2024 for anemia requiring blood transfusion.  She had been sent 12/712/8 with progressive shortness of breath x 2 weeks.  Initial labs revealed hemoglobin of 6.1.  Hemoccult was negative, troponin was negative, chest x-ray showed left base consolidation and effusion with mild vascular congestion.  She received IV antibiotics for  pneumonia and was transfused 1 unit of PRBCs.  She did remain in atrial fibrillation that was rate controlled and her apixaban  was held for 1 day.  Subsequent mission 12/17-12/22-hemoglobin of 7 but Hemoccult was positive.  She was maintaining sinus rhythm.  She was transfused 2 units of PRBCs.  Multiple myeloma was ruled out by nephrology.  The patient opted to have EGD/colonoscopy as an outpatient.  She was discharged on 12/22 plan follow-up with nephrology, oncology, GI.  She was instructed to hold her apixaban  until told to resume by GI or PCP.  She was also told to stop her Bystolic  and hydralazine .    She was last seen in clinic 05/02/2023 at that time she been feeling fairly well.  She had noticed her blood pressure had been elevated and that she had been out of atrial fibrillation more frequently.  When she was in A-fib she was extremely symptomatic.  After recent hospitalization she states all of her medications were placed on hold and she not had any of her blood pressure medications.  She called the triage line and was advised to restart start medications due to elevated blood pressures and being symptomatic.  She was concerned about elevated kidney function during recent hospitalization as well as her anemia and her apixaban  open stopped for atrial fibrillation.  She was continued on Bystolic  20 mg twice daily and restarted on hydralazine  25 mg at lunch and between her 2 Bystolic  doses and then after calling her in few days abdomen continued elevated blood pressures hydralazine  was  increased up to twice daily.  She returns to clinic today with complaints of shortness of breath that leads to chest tightness on inspiration.  She states that she has had this feeling before when she had recently had fluid buildup in her lung and her lung had to be tapped with thoracentesis.  She continues to follow-up with pulmonary in [place].  She has noted over the past several days at home that her weight has been  up 4 pounds, she has had progressive shortness of breath, and worsening peripheral edema.  Since hospital discharge she has not had any of her furosemide  as as it was on her paused list of medications.  With her shortness of breath she denies any orthopnea, PND, early satiety.  She states that she is not a 4 pound weight gain on her home scale over the last several days and worsening swelling to her bilateral lower extremities.  She also has a nonproductive cough that is more like clearing her throat from postnasal drip.  ROS: 10 point review of system has been reviewed and considered negative with exception of what is listed in the HPI  Studies Reviewed: SABRA   EKG Interpretation Date/Time:  Monday May 06 2023 14:18:52 EST Ventricular Rate:  70 PR Interval:  136 QRS Duration:  74 QT Interval:  408 QTC Calculation: 440 R Axis:   26  Text Interpretation: Normal sinus rhythm with sinus arrhythmia Normal ECG When compared with ECG of 02-May-2023 13:37, No significant change was found Confirmed by Gerard Frederick ([id number]) on 05/06/2023 2:19:42 PM    Renal Angiography 1.  Normal left renal artery with mild nonobstructive disease affecting the right renal artery.   Recommendations: No significant renal artery stenosis that requires revascularization.  Continue medical therapy. 40 mL of contrast was used.   2D echo 03/09/2022 1. Left ventricular ejection fraction, by estimation, is 55 to 60%. The  left ventricle has normal function. The left ventricle has no regional  wall motion abnormalities. Left ventricular diastolic function could not  be evaluated.   2. Right ventricular systolic function is normal. The right ventricular  size is normal. There is normal pulmonary artery systolic pressure. The  estimated right ventricular systolic pressure is 15.8 mmHg.   3. The mitral valve is grossly normal. Trivial mitral valve  regurgitation. No evidence of mitral stenosis.   4. The aortic valve is  tricuspid. There is mild calcification of the  aortic valve. Aortic valve regurgitation is not visualized. Aortic valve  sclerosis is present, with no evidence of aortic valve stenosis.   5. The inferior vena cava is normal in size with greater than 50%  respiratory variability, suggesting right atrial pressure of 3 mmHg.    Lexiscan  Myoview  01/11/2022 Pharmacological myocardial perfusion imaging study with no significant  ischemia Normal wall motion, EF estimated at 59% No EKG changes concerning for ischemia at peak stress or in recovery. CT attenuation correction images with no significant coronary calcification, minimal aortic atherosclerosis Low risk scan Risk Assessment/Calculations:    CHA2DS2-VASc Score = 4   This indicates a 4.8% annual risk of stroke. The patient's score is based upon: CHF History: 0 HTN History: 1 Diabetes History: 0 Stroke History: 0 Vascular Disease History: 0 Age Score: 2 Gender Score: 1        Physical Exam:   VS:  BP (!) 140/70 (BP Location: Left Arm, Patient Position: Sitting, Cuff Size: Large)   Pulse 70   Ht 5' 5 (1.651 m)  Wt 218 lb 2 oz (98.9 kg)   SpO2 95%   BMI 36.30 kg/m    Wt Readings from Last 3 Encounters:  05/06/23 218 lb 2 oz (98.9 kg)  05/02/23 215 lb (97.5 kg)  04/25/23 216 lb 4 oz (98.1 kg)    GEN: Well nourished, well developed in no acute distress NECK: No JVD; No carotid bruits CARDIAC: RRR, no murmurs, rubs, gallops RESPIRATORY:  Clear with diminished bases bilaterally to auscultation, respirations are unlabored at rest ABDOMEN: Soft, non-tender, non-distended EXTREMITIES:  1+ pitting edema; No deformity   ASSESSMENT AND PLAN: .   Shortness of breath with peripheral edema and no weight gain.  Patient states that she has cough that is nonproductive and feels that she has fluid in the back of her throat.  She denies any PND nor orthopnea.  She has been sent for chest x-ray as she previously had to have thoracentesis  fluid removed and has been off of her furosemide  since discharge from the hospital.  With continued shortness of breath she has been advised to take her Lasix  20 mg daily for the next 2 days.  She is also being scheduled for an updated echocardiogram.  Atypical chest discomfort on inspiration with associated shortness of breath.  EKG sinus rhythm with sinus arrhythmia with a rate of 70 with no ischemic changes noted.  Will continue to follow-up on respiratory issues but no further ischemic testing at this time.  Essential hypertension with blood pressure today 140/70.  Blood pressures have been averaging 1 70-1 80 at home.  She is compliant with her Bystolic  20 mg twice daily and hydralazine  25 mg twice daily but she has having numbness and tingling to her hands and legs and not feeling well on the hydralazine .  Hydralazine  has been discontinued and she has been started on doxazosin  1 mg daily which can actually be increased to twice daily.  She is encouraged to continue to monitor pressures 1 to 2 hours postmedication administration as well as to take her furosemide  20 mg daily for the next 2 days.  Paroxysmal atrial fibrillation with palpitations patient is in atrial fibrillation in 2023.  EKG today reveals sinus rhythm with sinus arrhythmia.  She is continued on Bystolic  20 mg twice daily.  Apixaban  remains on hold until her continued workup from GI for CHA2DS2-VASc score of 4.  She has been advised to call back to GI for continued follow-up.  CKD on last admission with a serum creatinine of 2.2 04/25/2023.  Serum creatinine 1 point 11/30/2023.  She has been encouraged to avoid NSAIDs and maintain a follow-up appointment with nephrology.  She has been advised if she does not tolerate the doxazosin  for her blood pressure will likely need to reach out to nephrology for further recommendations to allow for up titration of medications to control blood pressure without causing more harm to her kidneys.  Recent  GI bleed with anemia which she was heme positive on 04/09/2023.  Hemoglobin 6.1 she reports received 3 units of PRBCs.  Hemoglobin of 8.9 on 05/02/2027.  She has been encouraged to call GI back for continued evaluation and workup for recent GI bleed which is likely exacerbating symptoms. Echo scheduled for shortness of breath. If study is unchanged can complete endoscopy and/or colonoscopy without further ischemic evaluation and patient can proceed with acceptable risk.          Ms. Gordan perioperative risk of a major cardiac event is 0.4% according to the Revised Cardiac  Risk Index (RCRI).  Therefore, she is at low risk for perioperative complications.   Her functional capacity is fair at 4.64 METs according to the Duke Activity Status Index (DASI). Recommendations: The patient requires an echocardiogram before a disposition can be made regarding surgical risk.                  Dispo: Patient return to clinic to see MD/APP previously scheduled appointment in 05/20/2023 or sooner if needed for reevaluation of symptoms  Signed, Trevontae Lindahl, NP

## 2023-05-06 NOTE — Telephone Encounter (Signed)
 Patient called on Friday afternoon after appointment to reevaluate symptoms after restarting hydralazine  25 mg at lunch. She had just walked back in tot he house after going to the grocery store and her blood pressure was 188/84. Still elevated but an improvement. She stated that in the morning when she had gotten up her blood pressure was 148/73. She was not having any side effects from the medication or had noted that she was back in atrial fibrillation. She continued to feel well. At that time through shared decision making it was decided that she could increase her lunch time dose to 50 mg daily or spread it out and take 25 mg twice daily. She was encouraged to continue to monitor her blood pressure 1-2 hours post medication administration. Previous labs were discussed and she was glad to see a stable blood count and an improvement in her kidney function. She also noted she had an outpatient appointment with Nephrology on 05/09/2023. She was advised to call the on call provider for any questions or concerns she had over the weekend.

## 2023-05-06 NOTE — Telephone Encounter (Signed)
 Lynn Joseph,  Please advise as this is most likely not incorrect information from radiology.  Thanks!

## 2023-05-08 ENCOUNTER — Telehealth: Payer: Self-pay

## 2023-05-08 NOTE — Progress Notes (Signed)
 Increase fluid of the left lung base likely pleural effusion.  Symptoms should improve with furosemide .  Will need close follow-up to ensure it is not involving pneumonia or worsening effusion requiring repeat thoracentesis.

## 2023-05-08 NOTE — Telephone Encounter (Signed)
 Called and scheduled appointment per staff message. Patient is aware of the appointment time and date as well as the address. Patient was informed to arrive 10-15 minutes prior with updated insurance information. All questions were answered.

## 2023-05-09 DIAGNOSIS — N2581 Secondary hyperparathyroidism of renal origin: Secondary | ICD-10-CM | POA: Diagnosis not present

## 2023-05-09 DIAGNOSIS — I129 Hypertensive chronic kidney disease with stage 1 through stage 4 chronic kidney disease, or unspecified chronic kidney disease: Secondary | ICD-10-CM | POA: Diagnosis not present

## 2023-05-09 DIAGNOSIS — D631 Anemia in chronic kidney disease: Secondary | ICD-10-CM | POA: Diagnosis not present

## 2023-05-09 DIAGNOSIS — N184 Chronic kidney disease, stage 4 (severe): Secondary | ICD-10-CM | POA: Diagnosis not present

## 2023-05-09 DIAGNOSIS — N189 Chronic kidney disease, unspecified: Secondary | ICD-10-CM | POA: Diagnosis not present

## 2023-05-09 NOTE — Telephone Encounter (Signed)
Patient made aware of results. See result note.

## 2023-05-10 ENCOUNTER — Telehealth: Payer: Self-pay | Admitting: *Deleted

## 2023-05-10 ENCOUNTER — Ambulatory Visit: Payer: Medicare Other | Attending: Cardiology

## 2023-05-10 DIAGNOSIS — R0602 Shortness of breath: Secondary | ICD-10-CM | POA: Insufficient documentation

## 2023-05-10 LAB — ECHOCARDIOGRAM COMPLETE
AV Mean grad: 5 mm[Hg]
AV Peak grad: 9.2 mm[Hg]
Ao pk vel: 1.52 m/s
Area-P 1/2: 3.72 cm2
S' Lateral: 3.6 cm

## 2023-05-10 NOTE — Progress Notes (Signed)
Heart squeeze 55-60% which is normal function, there are no wall motion abnormalities noted, there is moderate pleural effusion in the left lateral region.  There is mild to moderate leakage in the mitral valve and mild to moderate leakage in the tricuspid valve.  Recommend referral to interventional radiology for thoracentesis.

## 2023-05-10 NOTE — Telephone Encounter (Signed)
-----   Message from [facility] sent at 05/10/2023  3:26 PM EST ----- Heart squeeze 55-60% which is normal function, there are no wall motion abnormalities noted, there is moderate pleural effusion in the left lateral region.  There is mild to moderate leakage in the mitral valve and mild to moderate leakage in the tri cuspid valve.  Recommend referral to interventional radiology for thoracentesis.

## 2023-05-10 NOTE — Telephone Encounter (Signed)
Patient made aware of results and verbalized understanding.  The patient stated that she is reluctant to do another thoracentesis. She stated that she talked to her kidney provider and he recommenced taking the furosemide daily for two days and then he would call her Monday with lab work results.   She stated that she felt fine and denied shortness of breath. She asked that the results be sent to her pulmonologist. They have been routed.   Patient has a follow up with Dr. Mariah Milling on 1/27 and will discuss her decision at that time.

## 2023-05-13 ENCOUNTER — Encounter (HOSPITAL_BASED_OUTPATIENT_CLINIC_OR_DEPARTMENT_OTHER): Payer: Self-pay | Admitting: Pulmonary Disease

## 2023-05-16 DIAGNOSIS — D509 Iron deficiency anemia, unspecified: Secondary | ICD-10-CM | POA: Diagnosis not present

## 2023-05-16 DIAGNOSIS — Z9884 Bariatric surgery status: Secondary | ICD-10-CM | POA: Diagnosis not present

## 2023-05-16 DIAGNOSIS — K922 Gastrointestinal hemorrhage, unspecified: Secondary | ICD-10-CM | POA: Diagnosis not present

## 2023-05-16 NOTE — Progress Notes (Unsigned)
Patient Care Team: Alysia Penna, MD as PCP - General (Internal Medicine) Antonieta Iba, MD as PCP - Cardiology (Cardiology) Alysia Penna, MD (Internal Medicine)  Clinic Day:  05/20/2023  Referring physician: Alysia Penna, MD  ASSESSMENT & PLAN:   Assessment & Plan: This is my first time meeting with patient.   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  Recent iron was inaccurate due to posttransfusion.  Report started apixaban sometimes in the last year.  She has relevant history of gastric bypass surgery.  We discussed often this resulted in iron and B12 deficiency.  We will repeat labs today.  Based on results, IV iron may be needed.  She will need continued monitoring iron levels in the future.  She also has a history of low B12 level as a result of her gastric surgery.  Recommend start B12 injection monthly.  Elevated light chains in the acute setting is not diagnostic.  Discussed repeat testing.  Elevated serum immunoglobulin free light chain level -     CBC with Differential (Cancer Center Only); Future -     IgG, IgA, IgM; Future -     Kappa/lambda light chains; Future -     Serum protein electrophoresis with reflex; Future -     CMP (Cancer Center only); Future  Iron deficiency anemia secondary to inadequate dietary iron intake Assessment & Plan: Repeat ferritin and CBC Order iv iron as needed.   History of gastric bypass -     Ferritin; Future -     Folate; Future -     Vitamin B12; Future -     CBC with Differential (Cancer Center Only); Future  Low serum vitamin B12 Assessment & Plan: Will start monthly b12. Ordered b12 today to start monthly   Other orders -     Cyanocobalamin  May start monthly B12 in 3 to 4 weeks. Follow-up in about 3 months with labs 2 weeks  before visit  The patient understands the plans discussed today and is in agreement with them.  She knows to contact our office if she develops concerns prior to her next appointment.  Melven Sartorius, MD  [facility] - A DEPT OF [facility] [address] [place] [address] Dept: [phone] Dept Fax: [phone]   Orders Placed This Encounter  Procedures   Ferritin    Standing Status:   Future    Number of Occurrences:   1    Expiration Date:   05/15/2024   Folate    Standing Status:   Future    Number of Occurrences:   1    Expiration Date:   05/15/2024   Vitamin B12    Standing Status:   Future    Number of Occurrences:   1    Expiration Date:   05/15/2024   CBC with Differential (Cancer Center Only)    Standing Status:   Future    Number of Occurrences:   1    Expiration Date:   05/16/2024   IgG, IgA, IgM    Standing Status:   Future    Number of Occurrences:   1    Expiration Date:   05/16/2024   Kappa/lambda light chains    Standing Status:   Future  Number of Occurrences:   1    Expiration Date:   05/16/2024   Serum protein electrophoresis with reflex    Standing Status:   Future    Number of Occurrences:   1    Expiration Date:   05/16/2024   CMP (Cancer Center only)    Standing Status:   Future    Number of Occurrences:   1    Expiration Date:   05/16/2024      CHIEF COMPLAINT:  CC: Iron deficiency and B12 deficiency   INTERVAL HISTORY:  Lynn Joseph is here today with her daughter.  This is my first time meeting with patient. Report worsening short of breath towards end of 2024. She was admitted for acute on chronic anemia and AKI in December.  Hemoglobin was 6.  Baseline appears to be between 8 and 9 prior to that.  B12 was low at 199 in Nov 2023.  Hemoglobin was 6.1 on 12/7.  MCV 89.  Records show received 2 units of PRBC on 12/7.  There was no iron panel ferritin until 12/17 which appeared  normal.   Report of short of breath resolved.   Report hgb 8.8 from nephrology.  Reports she may be getting epo injection and IV iron over there. PTH was elevated to 16.  Vitamin D25 hydroxy 37.7.  Iron saturation 17% WBC 5.6 MCV 86 platelet 289.  Creatinine 1.95  EGD done at [facility] on 05/16/23 Evidence of gastric bypass healthy-appearing mucosa.  No abnormal findings reported.    ALLERGIES:  is allergic to amlodipine, carvedilol, and penicillin g sodium.  MEDICATIONS:  Current Outpatient Medications  Medication Sig Dispense Refill   Calcium-Magnesium-Zinc (CALCIUM-MAGNESUIUM-ZINC PO) Take 1 tablet by mouth 2 (two) times a week.     colchicine 0.6 MG tablet Take 0.3 mg by mouth daily as needed (AS DIRECTED for gout flares).     doxazosin (CARDURA) 1 MG tablet Take 1 tablet (1 mg total) by mouth 2 (two) times daily. 180 tablet 3   febuxostat (ULORIC) 40 MG tablet Take 40 mg by mouth daily.     ferrous sulfate 325 (65 FE) MG tablet Take 1 tablet (325 mg total) by mouth 2 (two) times daily with a meal. 60 tablet 0   furosemide (LASIX) 20 MG tablet Take 20 mg by mouth daily as needed (for a weight gain of 4-5 pounds in the period of a couple of days).     gabapentin (NEURONTIN) 100 MG capsule Take 100 mg by mouth at bedtime.     Levothyroxine Sodium (TIROSINT) 100 MCG CAPS Take 1 capsule (100 mcg total) by mouth daily before breakfast. 30 capsule 0   Nebivolol HCl (BYSTOLIC) 20 MG TABS Take 1 tablet (20 mg total) by mouth 2 (two) times daily. 180 tablet 3   rosuvastatin (CRESTOR) 5 MG tablet Take 1 tablet (5 mg total) by mouth daily. 90 tablet 3   TYLENOL 500 MG tablet Take 1,000 mg by mouth every 6 (six) hours as needed for mild pain (pain score 1-3), headache or fever.     No current facility-administered medications for this visit.    HISTORY OF PRESENT ILLNESS:   Past Medical History:  Diagnosis Date   Anemia of chronic disease    Chronic diarrhea    Glaucoma    HTN  (hypertension)    Hypertension    Hypothyroid    Obesity    Skin cancer    Past Surgical History:  Procedure Laterality Date  CHOLECYSTECTOMY N/A 03/11/2022   Procedure: LAPAROSCOPIC CHOLECYSTECTOMY;  Surgeon: Fritzi Mandes, MD;  Location: [facility] OR;  Service: General;  Laterality: N/A;   GASTRIC BYPASS     GASTRIC RESTRICTION SURGERY     IR REMOVAL BILIARY DRAIN  04/06/2022   IR SINUS/FIST TUBE CHK-NON GI  03/09/2022   RENAL ANGIOGRAPHY N/A 06/20/2022   Procedure: RENAL ANGIOGRAPHY;  Surgeon: Iran Ouch, MD;  [facility]: [facility] INV[facility];  Service: Cardiovascular;  Laterality: N/A;   TUBAL LIGATION      REVIEW OF SYSTEMS:   All relevant systems were reviewed with the patient and are negative.   VITALS:  Blood pressure (!) 160/60, pulse 68, temperature 97.6 F (36.4 C), temperature source Temporal, resp. rate 18, height 5\' 5"  (1.651 m), weight 218 lb 6.4 oz (99.1 kg), SpO2 100%.  Wt Readings from Last 3 Encounters:  05/20/23 216 lb 2 oz (98 kg)  05/17/23 218 lb 6.4 oz (99.1 kg)  05/06/23 218 lb 2 oz (98.9 kg)    Body mass index is 36.34 kg/m.  Performance status (ECOG): 2 - Symptomatic, <50% confined to bed  PHYSICAL EXAM:   GENERAL: alert, no distress and comfortable SKIN: skin color normal, no jaundice EYES: normal, sclera clear LUNGS: normal breathing effort.  Decreased left lower base.  No wheeze or Rales HEART: regular rate & rhythm  ABDOMEN: abdomen soft, non-tender and nondistended   LABORATORY DATA:  I have reviewed the data as listed    Component Value Date/Time   NA 141 05/17/2023 1348   NA 144 05/02/2023 1430   K 4.2 05/17/2023 1348   CL 108 05/17/2023 1348   CO2 27 05/17/2023 1348   GLUCOSE 93 05/17/2023 1348   BUN 31 (H) 05/17/2023 1348   BUN 29 (H) 05/02/2023 1430   CREATININE 1.90 (H) 05/17/2023 1348   CALCIUM 8.0 (L) 05/17/2023 1348   PROT 7.0 05/17/2023 1348   ALBUMIN 3.8 05/17/2023 1348   AST 13 (L) 05/17/2023 1348   ALT 6  05/17/2023 1348   ALKPHOS 86 05/17/2023 1348   BILITOT 0.5 05/17/2023 1348   GFRNONAA 27 (L) 05/17/2023 1348   GFRAA 35 (L) 10/30/2018 0617    No results found for: "SPEP", "UPEP"  Lab Results  Component Value Date   WBC 6.1 05/17/2023   NEUTROABS 4.9 05/17/2023   HGB 8.9 (L) 05/17/2023   HCT 28.0 (L) 05/17/2023   MCV 88.9 05/17/2023   PLT 208 05/17/2023      Chemistry      Component Value Date/Time   NA 141 05/17/2023 1348   NA 144 05/02/2023 1430   K 4.2 05/17/2023 1348   CL 108 05/17/2023 1348   CO2 27 05/17/2023 1348   BUN 31 (H) 05/17/2023 1348   BUN 29 (H) 05/02/2023 1430   CREATININE 1.90 (H) 05/17/2023 1348      Component Value Date/Time   CALCIUM 8.0 (L) 05/17/2023 1348   ALKPHOS 86 05/17/2023 1348   AST 13 (L) 05/17/2023 1348   ALT 6 05/17/2023 1348   BILITOT 0.5 05/17/2023 1348       RADIOGRAPHIC STUDIES: I have personally reviewed the radiological images as listed and agreed with the findings in the report. ECHOCARDIOGRAM COMPLETE Result Date: 05/10/2023    ECHOCARDIOGRAM REPORT   Patient Name:   EARLE BURSON Date of Exam: 05/10/2023 Medical Rec #:  [id number]         Height:       65.0 in Accession #:  [id number]        Weight:       218.1 lb Date of Birth:  04/26/1945         BSA:          2.052 m Patient Age:    77 years          BP:           140/70 mmHg Patient Gender: F                 HR:           73 bpm. Exam Location:  [place] Procedure: 2D Echo, 3D Echo, Cardiac Doppler, Color Doppler and Strain Analysis Indications:    R06.9 DOE  History:        Patient has prior history of Echocardiogram examinations, most                 recent 03/09/2022. CHF, Arrythmias:Atrial Fibrillation,                 Signs/Symptoms:Dyspnea and Shortness of Breath; Risk                 Factors:Hypertension and Sleep Apnea.  Sonographer:    Ilda Mori MHA, BS, RDCS Referring Phys: [phone] SHERI HAMMOCK IMPRESSIONS  1. Left ventricular ejection fraction, by  estimation, is 55 to 60%. The left ventricle has normal function. The left ventricle has no regional wall motion abnormalities. Left ventricular diastolic parameters were normal.  2. Right ventricular systolic function is normal. The right ventricular size is mildly enlarged.  3. Left atrial size was severely dilated.  4. A small pericardial effusion is present. Moderate pleural effusion in the left lateral region.  5. The mitral valve is normal in structure. Mild to moderate mitral valve regurgitation.  6. Tricuspid valve regurgitation is mild to moderate.  7. The aortic valve is tricuspid. Aortic valve regurgitation is not visualized.  8. The inferior vena cava is normal in size with greater than 50% respiratory variability, suggesting right atrial pressure of 3 mmHg. FINDINGS  Left Ventricle: Left ventricular ejection fraction, by estimation, is 55 to 60%. The left ventricle has normal function. The left ventricle has no regional wall motion abnormalities. The left ventricular internal cavity size was normal in size. There is  no left ventricular hypertrophy. Left ventricular diastolic parameters were normal. Right Ventricle: The right ventricular size is mildly enlarged. No increase in right ventricular wall thickness. Right ventricular systolic function is normal. Left Atrium: Left atrial size was severely dilated. Right Atrium: Right atrial size was normal in size. Pericardium: A small pericardial effusion is present. Mitral Valve: The mitral valve is normal in structure. Mild to moderate mitral valve regurgitation. Tricuspid Valve: The tricuspid valve is normal in structure. Tricuspid valve regurgitation is mild to moderate. Aortic Valve: The aortic valve is tricuspid. Aortic valve regurgitation is not visualized. Aortic valve mean gradient measures 5.0 mmHg. Aortic valve peak gradient measures 9.2 mmHg. Pulmonic Valve: The pulmonic valve was normal in structure. Pulmonic valve regurgitation is mild. Aorta:  The aortic root is normal in size and structure. Venous: The inferior vena cava is normal in size with greater than 50% respiratory variability, suggesting right atrial pressure of 3 mmHg. IAS/Shunts: No atrial level shunt detected by color flow Doppler. Additional Comments: There is a moderate pleural effusion in the left lateral region.  LEFT VENTRICLE PLAX 2D LVIDd:         5.20 cm Diastology LVIDs:  3.60 cm LV e' medial:    8.38 cm/s LV PW:         0.90 cm LV E/e' medial:  13.7 LV IVS:        0.90 cm LV e' lateral:   12.20 cm/s                        LV E/e' lateral: 9.4                         2D Longitudinal Strain                        2D Strain GLS Avg:     -17.3 %                         3D Volume EF:                        3D EF:        46 %                        LV EDV:       145 ml                        LV ESV:       79 ml                        LV SV:        66 ml RIGHT VENTRICLE RV Basal diam:  4.50 cm RV Mid diam:    3.00 cm RV S prime:     11.10 cm/s TAPSE (M-mode): 3.1 cm LEFT ATRIUM              Index        RIGHT ATRIUM           Index LA diam:        4.80 cm  2.34 cm/m   RA Area:     18.10 cm LA Vol (A2C):   134.0 ml 65.29 ml/m  RA Volume:   47.00 ml  22.90 ml/m LA Vol (A4C):   110.0 ml 53.60 ml/m LA Biplane Vol: 124.0 ml 60.42 ml/m  AORTIC VALVE AV Vmax:           152.00 cm/s AV Vmean:          106.000 cm/s AV VTI:            0.378 m AV Peak Grad:      9.2 mmHg AV Mean Grad:      5.0 mmHg LVOT Vmax:         91.10 cm/s LVOT Vmean:        63.200 cm/s LVOT VTI:          0.233 m LVOT/AV VTI ratio: 0.62  AORTA Ao Root diam: 2.80 cm MITRAL VALVE MV Area (PHT): 3.72 cm     SHUNTS MV Decel Time: 204 msec     Systemic VTI: 0.23 m MV E velocity: 115.00 cm/s MV A velocity: 78.60 cm/s MV E/A ratio:  1.46 Debbe Odea MD Electronically signed by Debbe Odea MD Signature Date/Time: 05/10/2023/10:36:11 AM    Final    DG Chest 2 View Result Date: 05/08/2023 CLINICAL DATA:  Shortness of breath and chest pain on exertion for 3 days. EXAM: CHEST - 2 VIEW COMPARISON:  04/12/2023. FINDINGS: Opacity at the left lung base has increased the prior study, but is similar to the exam dated 03/26/2023. Opacity obscures the left hemidiaphragm, consistent with a combination of pleural fluid and atelectasis. Pneumonia is possible. Remainder of the lungs is clear. No right pleural effusion. No pneumothorax. Cardiac silhouette is normal in size. No mediastinal or hilar masses. No evidence of adenopathy. Skeletal structures are intact. IMPRESSION: 1. Left lung base opacity consistent with a combination of a small pleural effusion with associated atelectasis and/or pneumonia. Appearance is similar to the exam from 03/26/2023. Electronically Signed   By: Amie Portland M.D.   On: 05/08/2023 12:17

## 2023-05-17 ENCOUNTER — Telehealth: Payer: Self-pay

## 2023-05-17 ENCOUNTER — Ambulatory Visit (HOSPITAL_BASED_OUTPATIENT_CLINIC_OR_DEPARTMENT_OTHER): Payer: Medicare Other | Admitting: Pulmonary Disease

## 2023-05-17 ENCOUNTER — Inpatient Hospital Stay: Payer: Medicare Other

## 2023-05-17 VITALS — BP 160/60 | HR 68 | Temp 97.6°F | Resp 18 | Ht 65.0 in | Wt 218.4 lb

## 2023-05-17 DIAGNOSIS — Z9884 Bariatric surgery status: Secondary | ICD-10-CM | POA: Insufficient documentation

## 2023-05-17 DIAGNOSIS — N189 Chronic kidney disease, unspecified: Secondary | ICD-10-CM | POA: Diagnosis not present

## 2023-05-17 DIAGNOSIS — I129 Hypertensive chronic kidney disease with stage 1 through stage 4 chronic kidney disease, or unspecified chronic kidney disease: Secondary | ICD-10-CM | POA: Insufficient documentation

## 2023-05-17 DIAGNOSIS — E538 Deficiency of other specified B group vitamins: Secondary | ICD-10-CM | POA: Insufficient documentation

## 2023-05-17 DIAGNOSIS — R768 Other specified abnormal immunological findings in serum: Secondary | ICD-10-CM

## 2023-05-17 DIAGNOSIS — D508 Other iron deficiency anemias: Secondary | ICD-10-CM | POA: Insufficient documentation

## 2023-05-17 DIAGNOSIS — I480 Paroxysmal atrial fibrillation: Secondary | ICD-10-CM | POA: Insufficient documentation

## 2023-05-17 DIAGNOSIS — Z7901 Long term (current) use of anticoagulants: Secondary | ICD-10-CM | POA: Insufficient documentation

## 2023-05-17 DIAGNOSIS — Z79899 Other long term (current) drug therapy: Secondary | ICD-10-CM | POA: Diagnosis not present

## 2023-05-17 DIAGNOSIS — D509 Iron deficiency anemia, unspecified: Secondary | ICD-10-CM | POA: Insufficient documentation

## 2023-05-17 LAB — CBC WITH DIFFERENTIAL (CANCER CENTER ONLY)
Abs Immature Granulocytes: 0.04 10*3/uL (ref 0.00–0.07)
Basophils Absolute: 0 10*3/uL (ref 0.0–0.1)
Basophils Relative: 0 %
Eosinophils Absolute: 0.1 10*3/uL (ref 0.0–0.5)
Eosinophils Relative: 2 %
HCT: 28 % — ABNORMAL LOW (ref 36.0–46.0)
Hemoglobin: 8.9 g/dL — ABNORMAL LOW (ref 12.0–15.0)
Immature Granulocytes: 1 %
Lymphocytes Relative: 10 %
Lymphs Abs: 0.6 10*3/uL — ABNORMAL LOW (ref 0.7–4.0)
MCH: 28.3 pg (ref 26.0–34.0)
MCHC: 31.8 g/dL (ref 30.0–36.0)
MCV: 88.9 fL (ref 80.0–100.0)
Monocytes Absolute: 0.4 10*3/uL (ref 0.1–1.0)
Monocytes Relative: 7 %
Neutro Abs: 4.9 10*3/uL (ref 1.7–7.7)
Neutrophils Relative %: 80 %
Platelet Count: 208 10*3/uL (ref 150–400)
RBC: 3.15 MIL/uL — ABNORMAL LOW (ref 3.87–5.11)
RDW: 15.9 % — ABNORMAL HIGH (ref 11.5–15.5)
WBC Count: 6.1 10*3/uL (ref 4.0–10.5)
nRBC: 0 % (ref 0.0–0.2)

## 2023-05-17 LAB — CMP (CANCER CENTER ONLY)
ALT: 6 U/L (ref 0–44)
AST: 13 U/L — ABNORMAL LOW (ref 15–41)
Albumin: 3.8 g/dL (ref 3.5–5.0)
Alkaline Phosphatase: 86 U/L (ref 38–126)
Anion gap: 6 (ref 5–15)
BUN: 31 mg/dL — ABNORMAL HIGH (ref 8–23)
CO2: 27 mmol/L (ref 22–32)
Calcium: 8 mg/dL — ABNORMAL LOW (ref 8.9–10.3)
Chloride: 108 mmol/L (ref 98–111)
Creatinine: 1.9 mg/dL — ABNORMAL HIGH (ref 0.44–1.00)
GFR, Estimated: 27 mL/min — ABNORMAL LOW (ref >60)
Glucose, Bld: 93 mg/dL (ref 70–99)
Potassium: 4.2 mmol/L (ref 3.5–5.1)
Sodium: 141 mmol/L (ref 135–145)
Total Bilirubin: 0.5 mg/dL (ref 0.0–1.2)
Total Protein: 7 g/dL (ref 6.5–8.1)

## 2023-05-17 LAB — VITAMIN B12: Vitamin B-12: 564 pg/mL (ref 180–914)

## 2023-05-17 LAB — FOLATE: Folate: 8.5 ng/mL (ref >5.9)

## 2023-05-17 LAB — FERRITIN: Ferritin: 188 ng/mL (ref 11–307)

## 2023-05-17 NOTE — Assessment & Plan Note (Signed)
Repeat ferritin and CBC Order iv iron as needed.

## 2023-05-17 NOTE — Progress Notes (Unsigned)
Cardiology Office Note  Date:  05/20/2023   ID:  Lynn, Joseph 1945-08-12, MRN [id number]  PCP:  Alysia Penna, MD   Chief Complaint  Patient presents with   Follow-up    Discuss Echo results (05/10/2023). Patient c/o blood pressure fluctuating.     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, repair roux-y History of pleural effusion requiring thoracentesis Presenting for follow-up of her diastolic CHF, paroxysmal atrial fibrillation, renal artery stenosis, anemia  Last seen by myself 7/24 Seen by one of our providers May 06, 2023  In the hospital 12/24: anemia, HGB 6.7 Reports all of her medications were held Followed by hematology Received iron infusion B12 in a month  In the setting of anemia Echocardiogram May 10, 2023 for shortness of breath EF 55 to 60%, normal RV function Severely dilated left atrium Moderate pleural effusion on the left Mild to moderate MR  It was recommended that she start on Lasix daily She has not been taking Lasix on a regular basis  Reports that her pressure has been running high at home, 180 systolic Restarted hydralazine Started cardura last week 1 mg once a day Off hydralazine Off eliquis   SOB better after blood transfusions, Procrit, iron infusion Not on lasix, takes prn  Appreciates trace lower extremity edema, denies any coughing  EKG personally reviewed by myself on todays visit EKG Interpretation Date/Time:  Monday May 20 2023 10:29:37 EST Ventricular Rate:  61 PR Interval:  146 QRS Duration:  80 QT Interval:  446 QTC Calculation: 448 R Axis:   21  Text Interpretation: Normal sinus rhythm Normal ECG When compared with ECG of 06-May-2023 14:18, No significant change was found  Confirmed by Julien Nordmann [id number]) on 05/20/2023 10:52:07 AM   Other past medical history reviewed  Was in [place] hospitalized in November 2023 with new onset atrial fibrillation with RVR in the setting of acute cholecystitis. Following surgery with pleural effusion, thoracentesis performed.  She was treated with IV diltiazem and metoprolol and converted to sinus rhythm. She was started on anticoagulation with Eliquis given CHA2DS2-VASc score of 4.   Right renal artery ultrasound performed, right side was not well-visualized and possibly occluded with small size right kidney at 8.7 cm. Left renal artery was patent with normal size left kidney. She has chronic kidney disease with most recent creatinine of 1.58.   In follow-up today reports feeling relatively well Blood pressure elevated before her morning medication, Running in the mid 130 range systolic after medications  Denies significant lower extremity swelling No significant chest pain or shortness of breath  Sedentary, no regular exercise program Runs dog rescue,   Echocardiogram November 2023 EF 55 to 60%  U/s in 10/21 Right Carotid: Velocities in the right ICA are consistent with a 1-39% stenosis.  Left Carotid: Velocities in the left ICA are consistent with a 40-59% stenosis.   PMH:   has a past medical history of Anemia of chronic disease, Chronic diarrhea, Glaucoma, HTN (hypertension), Hypertension, Hypothyroid, Obesity, and Skin cancer.  PSH:    Past Surgical History:  Procedure Laterality Date   CHOLECYSTECTOMY N/A 03/11/2022   Procedure: LAPAROSCOPIC CHOLECYSTECTOMY;  Surgeon: Fritzi Mandes, MD;  Location: [facility] OR;  Service: General;  Laterality: N/A;   GASTRIC BYPASS     GASTRIC  RESTRICTION SURGERY     IR REMOVAL BILIARY DRAIN  04/06/2022   IR SINUS/FIST TUBE CHK-NON GI  03/09/2022   RENAL ANGIOGRAPHY N/A 06/20/2022   Procedure: RENAL ANGIOGRAPHY;  Surgeon: Iran Ouch, MD;  Location: [facility];   Service: Cardiovascular;  Laterality: N/A;   TUBAL LIGATION      Current Outpatient Medications  Medication Sig Dispense Refill   Calcium-Magnesium-Zinc (CALCIUM-MAGNESUIUM-ZINC PO) Take 1 tablet by mouth 2 (two) times a week.     colchicine 0.6 MG tablet Take 0.3 mg by mouth daily as needed (AS DIRECTED for gout flares).     doxazosin (CARDURA) 1 MG tablet Take 1 tablet (1 mg total) by mouth daily. 90 tablet 3   febuxostat (ULORIC) 40 MG tablet Take 40 mg by mouth daily.     ferrous sulfate 325 (65 FE) MG tablet Take 1 tablet (325 mg total) by mouth 2 (two) times daily with a meal. 60 tablet 0   furosemide (LASIX) 20 MG tablet Take 20 mg by mouth daily as needed (for a weight gain of 4-5 pounds in the period of a couple of days).     gabapentin (NEURONTIN) 100 MG capsule Take 100 mg by mouth at bedtime.     Levothyroxine Sodium (TIROSINT) 100 MCG CAPS Take 1 capsule (100 mcg total) by mouth daily before breakfast. 30 capsule 0   Nebivolol HCl (BYSTOLIC) 20 MG TABS Take 1 tablet (20 mg total) by mouth 2 (two) times daily. 180 tablet 3   TYLENOL 500 MG tablet Take 1,000 mg by mouth every 6 (six) hours as needed for mild pain (pain score 1-3), headache or fever.     No current facility-administered medications for this visit.    Allergies:   Amlodipine, Carvedilol, and Penicillin g sodium   Social History:  The patient  reports that she has never smoked. She has never used smokeless tobacco. She reports that she does not currently use alcohol. She reports that she does not use drugs.   Family History:   family history includes Diabetes in her father; Hypertension in her mother; Kidney disease in her father; Stroke in her mother; Thyroid disease in her mother.    Review of Systems: Review of Systems  Constitutional: Negative.   HENT: Negative.    Respiratory: Negative.    Cardiovascular:  Positive for leg swelling.  Gastrointestinal: Negative.   Musculoskeletal: Negative.    Neurological: Negative.   Psychiatric/Behavioral: Negative.    All other systems reviewed and are negative.   PHYSICAL EXAM: VS:  BP (!) 150/68 (BP Location: Left Arm, Patient Position: Sitting, Cuff Size: Normal)   Pulse 61   Ht 5\' 5"  (1.651 m)   Wt 216 lb 2 oz (98 kg)   SpO2 97%   BMI 35.97 kg/m  , BMI Body mass index is 35.97 kg/m. Constitutional:  oriented to person, place, and time. No distress.  HENT:  Head: Grossly normal Eyes:  no discharge. No scleral icterus.  Neck: No JVD, no carotid bruits  Cardiovascular: Regular rate and rhythm, no murmurs appreciated Pulmonary/Chest: Clear to auscultation bilaterally, no wheezes or rails Abdominal: Soft.  no distension.  no tenderness.  Musculoskeletal: Normal range of motion Neurological:  normal muscle tone. Coordination normal. No atrophy Skin: Skin warm and dry Psychiatric: normal affect, pleasant  Recent Labs: 03/30/2023: B Natriuretic Peptide 311.9; Magnesium 1.4 04/11/2023: TSH 4.193 05/17/2023: ALT 6; BUN 31; Creatinine 1.90; Hemoglobin 8.9; Platelet Count 208; Potassium 4.2; Sodium 141  Lipid Panel Lab Results  Component Value Date   CHOL 160 10/28/2018   HDL 55 10/28/2018   LDLCALC 92 10/28/2018   TRIG 64 10/28/2018      Wt Readings from Last 3 Encounters:  05/20/23 216 lb 2 oz (98 kg)  05/17/23 218 lb 6.4 oz (99.1 kg)  05/06/23 218 lb 2 oz (98.9 kg)      ASSESSMENT AND PLAN:  Problem List Items Addressed This Visit       Cardiology Problems   Benign essential hypertension   Relevant Orders   EKG 12-Lead (Completed)   PAF (paroxysmal atrial fibrillation) (HCC) - Primary   Relevant Orders   EKG 12-Lead (Completed)     Other   Acute respiratory failure with hypoxia (HCC)   CKD (chronic kidney disease) stage 3, GFR 30-59 ml/min (HCC)   Dyspnea   Relevant Orders   EKG 12-Lead (Completed)   Other Visit Diagnoses       Anemia, unspecified type         Primary hypertension       Relevant  Orders   EKG 12-Lead (Completed)      Chest pain Normal ejection fraction November 2023 Low risk stress test September 2023 with no significant ischemia Denies any chest pain concerning for angina No further testing at this time  Paroxysmal atrial fibrillation Eliquis on hold in the setting of anemia She is not eager to proceed with colonoscopy Hemoglobin less than 10, received blood transfusions, iron infusion, B12 Discussed waiting for hemoglobin to be greater than 10, discussed stool guaiacs, consider Cologuard Recommended she use Apple Watch to monitor for arrhythmia We did discuss Watchman device, she prefers no intervention at this time  Hyperlipidemia Recommended she restart Crestor 5 mg daily Goal LDL less than 70  Chronic renal sufficiency/renal artery stenosis Creatinine 1.9, slow improvement from December 2024 Avoid NSAIDs Followed by nephrology  Essential hypertension Blood pressure running high recommend she increase Cardura up to 1 mg twice daily Continue nebivolol 20 twice daily  Obesity We have encouraged continued exercise, careful diet management     Signed, Dossie Arbour, M.D., Ph.D. [name] [place], [place] [phone]

## 2023-05-17 NOTE — Telephone Encounter (Signed)
Per Dr. Cherly Hensen, called [facility] for pt's recent EGD report. Records were faxed over and given to Dr. Cherly Hensen. Also called [facility] for pt's lab results from yesterday, and these results were received via fax. Called [facility] a second time at approx 1600 to find out if Dr. Allena Katz has or plans to order IV iron for pt. Left a VM w/ assistant/nurse, requesting OV notes from pt's visit yesterday or a call back to inform Dr. Cherly Hensen of Dr. Eliane Decree treatment plans.

## 2023-05-17 NOTE — Assessment & Plan Note (Addendum)
Will start monthly b12. Ordered b12 today to start monthly

## 2023-05-19 LAB — IGG, IGA, IGM
IgA: 194 mg/dL (ref 64–422)
IgG (Immunoglobin G), Serum: 969 mg/dL (ref 586–1602)
IgM (Immunoglobulin M), Srm: 184 mg/dL (ref 26–217)

## 2023-05-20 ENCOUNTER — Encounter: Payer: Self-pay | Admitting: Cardiovascular Disease

## 2023-05-20 ENCOUNTER — Ambulatory Visit: Payer: Medicare Other | Attending: Cardiovascular Disease | Admitting: Cardiovascular Disease

## 2023-05-20 ENCOUNTER — Ambulatory Visit (HOSPITAL_BASED_OUTPATIENT_CLINIC_OR_DEPARTMENT_OTHER): Payer: Medicare Other

## 2023-05-20 ENCOUNTER — Ambulatory Visit (HOSPITAL_BASED_OUTPATIENT_CLINIC_OR_DEPARTMENT_OTHER): Payer: Medicare Other | Admitting: Pulmonary Disease

## 2023-05-20 ENCOUNTER — Encounter (HOSPITAL_BASED_OUTPATIENT_CLINIC_OR_DEPARTMENT_OTHER): Payer: Self-pay

## 2023-05-20 ENCOUNTER — Encounter (HOSPITAL_BASED_OUTPATIENT_CLINIC_OR_DEPARTMENT_OTHER): Payer: Self-pay | Admitting: Pulmonary Disease

## 2023-05-20 ENCOUNTER — Encounter: Payer: Self-pay | Admitting: Nephrology

## 2023-05-20 ENCOUNTER — Telehealth: Payer: Self-pay | Admitting: *Deleted

## 2023-05-20 VITALS — BP 150/68 | HR 61 | Ht 65.0 in | Wt 216.1 lb

## 2023-05-20 VITALS — BP 152/70 | HR 63 | Resp 16 | Ht 65.0 in | Wt 215.5 lb

## 2023-05-20 DIAGNOSIS — J9601 Acute respiratory failure with hypoxia: Secondary | ICD-10-CM

## 2023-05-20 DIAGNOSIS — D649 Anemia, unspecified: Secondary | ICD-10-CM | POA: Diagnosis not present

## 2023-05-20 DIAGNOSIS — J90 Pleural effusion, not elsewhere classified: Secondary | ICD-10-CM

## 2023-05-20 DIAGNOSIS — R0602 Shortness of breath: Secondary | ICD-10-CM

## 2023-05-20 DIAGNOSIS — N1832 Chronic kidney disease, stage 3b: Secondary | ICD-10-CM

## 2023-05-20 DIAGNOSIS — I700 Atherosclerosis of aorta: Secondary | ICD-10-CM | POA: Diagnosis not present

## 2023-05-20 DIAGNOSIS — I10 Essential (primary) hypertension: Secondary | ICD-10-CM | POA: Diagnosis not present

## 2023-05-20 DIAGNOSIS — G4733 Obstructive sleep apnea (adult) (pediatric): Secondary | ICD-10-CM | POA: Diagnosis not present

## 2023-05-20 DIAGNOSIS — I480 Paroxysmal atrial fibrillation: Secondary | ICD-10-CM | POA: Diagnosis not present

## 2023-05-20 LAB — KAPPA/LAMBDA LIGHT CHAINS
Kappa free light chain: 67.5 mg/L — ABNORMAL HIGH (ref 3.3–19.4)
Kappa, lambda light chain ratio: 1.35 (ref 0.26–1.65)
Lambda free light chains: 50 mg/L — ABNORMAL HIGH (ref 5.7–26.3)

## 2023-05-20 MED ORDER — DOXAZOSIN MESYLATE 1 MG PO TABS: 1.0000 mg | ORAL_TABLET | Freq: Two times a day (BID) | ORAL | 3 refills | Status: DC

## 2023-05-20 MED ORDER — ROSUVASTATIN CALCIUM 5 MG PO TABS: 5.0000 mg | ORAL_TABLET | Freq: Every day | ORAL | 3 refills | Status: DC

## 2023-05-20 NOTE — Patient Instructions (Addendum)
Medication Instructions:  Please increase the cardura up to 1 mg twice a day  Restart crestor  5mg  daily  If you need a refill on your cardiac medications before your next appointment, please call your pharmacy.   Lab work: No new labs needed  Testing/Procedures: No new testing needed  Follow-Up: At [facility], you and your health needs are our priority.  As part of our continuing mission to provide you with exceptional heart care, we have created designated Provider Care Teams.  These Care Teams include your primary Cardiologist (physician) and Advanced Practice Providers (APPs -  Physician Assistants and Nurse Practitioners) who all work together to provide you with the care you need, when you need it.  You will need a follow up appointment in 6 months  Providers on your designated Care Team:   Nicolasa Ducking, NP Eula Listen, PA-C Cadence Fransico Michael, [place]  COVID-19 Vaccine Information can be found at: [email or web address] For questions related to vaccine distribution or appointments, please email [email or web address] or call [phone].

## 2023-05-20 NOTE — Patient Instructions (Addendum)
Small Left Pleural Effusion - improving on diuretics --Take lasix as directed by Cardiology --Monitor weights daily  Moderate OSA --Reviewed compliance report on last visit. 02/24/23-03/25/23 (30 days) 100% compliance with 90% >4 hours. Leakage occurs for 2 h 33 min. Avg AHI 27.8 --Will review CPAP compliance report with new facemask to review AHI

## 2023-05-20 NOTE — Telephone Encounter (Signed)
Notified of message below. Verbalized understanding

## 2023-05-20 NOTE — Progress Notes (Signed)
Subjective:   [name]: Gerrit Heck GENDER: female DOB: [birth year], MRN: [id number]  Chief Complaint  Patient presents with   Follow-up    Breathing is fine. Has been getting better since 13th.     Reason for Visit: Follow-up  Ms. Selene Peltzer is a 78 year old female with atrial fibrillation, hx pleural effusion that self resolved, HTN, hypothyroidism, s/p roux-en-y gastric bypass who presents for OSA follow-up.  2023 - Initially seen by [facility] while inpatient for [facility] and found with right pleural effusion.  2024 - Self resolution of right pleural effusion. Dx with OSA and CPAP ordered in Feb  05/20/23 Since our last visit she reports her breathing is overall doing well. Recently had echocardiogram demonstrating moderate left pleural effusion. She declined thoracentesis and opted for lasix trial. Now able to lay down flat. Denies shortness of breath, cough after taking additional lasix. She has had mask fitting and is compliant with CPAP for 7 hours nightly with less leakage and feels it improves her quality of sleep.   Social History: Denies any smoking history however lived with smoker and had wood burning stove used for heat and cooking in home for >30 years    Past Medical History:  Diagnosis Date   Anemia of chronic disease    Chronic diarrhea    Glaucoma    HTN (hypertension)    Hypertension    Hypothyroid    Obesity    Skin cancer      Family History  Problem Relation Age of Onset   Hypertension Mother    Thyroid disease Mother    Stroke Mother    Kidney disease Father    Diabetes Father      Social History   Occupational History   Not on file  Tobacco Use   Smoking status: Never   Smokeless tobacco: Never  Vaping Use   Vaping status: Never Used  Substance and Sexual Activity   Alcohol use: Not Currently    Comment: 1 glass of wine weekly   Drug use: No   Sexual activity: Not on file    Allergies  Allergen Reactions    Amlodipine Swelling and Other (See Comments)    Lowered B/P too much, made lethargic, also     Carvedilol Other (See Comments)    Dropped blood pressure too low   Penicillin G Sodium Other (See Comments)    GI upset     Outpatient Medications Prior to Visit  Medication Sig Dispense Refill   Calcium-Magnesium-Zinc (CALCIUM-MAGNESUIUM-ZINC PO) Take 1 tablet by mouth 2 (two) times a week.     colchicine 0.6 MG tablet Take 0.3 mg by mouth daily as needed (AS DIRECTED for gout flares).     doxazosin (CARDURA) 1 MG tablet Take 1 tablet (1 mg total) by mouth 2 (two) times daily. 180 tablet 3   febuxostat (ULORIC) 40 MG tablet Take 40 mg by mouth daily.     ferrous sulfate 325 (65 FE) MG tablet Take 1 tablet (325 mg total) by mouth 2 (two) times daily with a meal. 60 tablet 0   furosemide (LASIX) 20 MG tablet Take 20 mg by mouth daily as needed (for a weight gain of 4-5 pounds in the period of a couple of days).     gabapentin (NEURONTIN) 100 MG capsule Take 100 mg by mouth at bedtime.     Levothyroxine Sodium (TIROSINT) 100 MCG CAPS Take 1 capsule (100 mcg total) by mouth daily before  breakfast. 30 capsule 0   Nebivolol HCl (BYSTOLIC) 20 MG TABS Take 1 tablet (20 mg total) by mouth 2 (two) times daily. 180 tablet 3   rosuvastatin (CRESTOR) 5 MG tablet Take 1 tablet (5 mg total) by mouth daily. 90 tablet 3   TYLENOL 500 MG tablet Take 1,000 mg by mouth every 6 (six) hours as needed for mild pain (pain score 1-3), headache or fever.     No facility-administered medications prior to visit.    Review of Systems  Constitutional:  Negative for chills, diaphoresis, fever, malaise/fatigue and weight loss.  HENT:  Negative for congestion.   Respiratory:  Negative for cough, hemoptysis, sputum production, shortness of breath and wheezing.   Cardiovascular:  Negative for chest pain, palpitations and leg swelling.     Objective:   Vitals:   05/20/23 1327  BP: (!) 152/70  Pulse: 63  Resp: 16   SpO2: 94%  Weight: 215 lb 8 oz (97.8 kg)  Height: 5\' 5"  (1.651 m)    SpO2: 94 %  Physical Exam: General: Well-appearing, no acute distress HENT: [place], AT Eyes: EOMI, no scleral icterus Respiratory: Left base air entry decreased.  No crackles, wheezing or rales Cardiovascular: RRR, -M/R/G, no JVD Extremities:-Edema,-tenderness Neuro: AAO x4, CNII-XII grossly intact Psych: Normal mood, normal affect  Data Reviewed:  Imaging: CXR 03/19/22 - Increased right pleural effusion CXR 05/14/22 - Resolved right pleural effusion CXR 05/06/23 - Left lung base c/w with small pleural effusion with atelectasis CXR 05/20/23 - Slightly improved with left pleural effusion  PFT: 05/14/22 FVC 2.21 (78%) FEV1 1.80 (85%) Ratio 82  TLC 96% DLCO 87% Interpretation: Normal PFTs  Sleep: HST 05/23/22 - Moderate OSA AHI 19.5 Nadir SpO2 82%  Labs:  Thoracentesis 03/16/2022 Red turbid fluid with cell count 1990 N 17% L 72% M percent Protein ratio 3.5/6.4 LDH ratio 141/251 Culture - Neg Cytology - Reactive mesothelial cells present  Glucose - 125  [facility] 07/24/22 - CBC with diff, CMET, TSH, T3/4, B12    Assessment & Plan:   Discussion: 78 year old female with HTN, hypothyroidism, atrial fibrillation on AC, s/p roux-en-y gastric bypass in 2009, CKD IIIb, ?diastolic heart failure who presents for left pleural effusion and OSA follow-up. Left pleural effusion improved/stable after medical management. No thoracentesis indicated.  Regarding OSA,  compliant with CPAP. Improved energy and sleep with CPAP.   Small Left Pleural Effusion - improving on diuretics --Take lasix as directed by Cardiology --Monitor weights daily  Moderate OSA --Reviewed compliance report on last visit. 02/24/23-03/25/23 (30 days) 100% compliance with 90% >4 hours. Leakage occurs for 2 h 33 min. Avg AHI 27.8 --Will review CPAP compliance report with new facemask to review AHI  >>AHI 11.6, improved on new  mask --Counseled on sleep hygiene --Counseled on weight loss/maintenance of healthy weight --Counseled NOT to drive if/when sleepy --Advised patient to wear CPAP for at least 4 hours each night for greater than 70% of the time to avoid the machine being repossessed by insurance.   Health Maintenance Immunization History  Administered Date(s) Administered   Influenza, High Dose Seasonal PF 01/31/2016   PFIZER(Purple Top)SARS-COV-2 Vaccination 05/15/2019, 06/05/2019   CT Lung Screen not qualified. Never smoker  Orders Placed This Encounter  Procedures   DG Chest 2 View    Standing Status:   Future    Number of Occurrences:   1    Expiration Date:   05/19/2024    Reason for Exam (SYMPTOM  OR  DIAGNOSIS REQUIRED):   pleural effusion    Preferred imaging location?:   [facility]  No orders of the defined types were placed in this encounter.   Return in about 9 months (around 02/17/2024).  I have spent a total time of 30-minutes on the day of the appointment including chart review, data review, collecting history, coordinating care and discussing medical diagnosis and plan with the patient/family. Past medical history, allergies, medications were reviewed. Pertinent imaging, labs and tests included in this note have been reviewed and interpreted independently by me.  Caprina Wussow Mechele Collin, MD [facility] 05/20/2023 1:50 PM

## 2023-05-20 NOTE — Telephone Encounter (Signed)
-----   Message from Melven Sartorius sent at 05/20/2023  9:58 AM EST ----- Lynn Joseph please let her know may start monthly b12 in about 3-4 weeks. The #s are fine currently for her b12, ferritin (iron storage) and hemoglobin is stable. She can follow up with nephrology on EPO shot at their recommended timeframe. We can let her know when other testing results are back next week on if we need additional testing and follow up timeframe. Thanks.

## 2023-05-23 LAB — PROTEIN ELECTROPHORESIS, SERUM, WITH REFLEX
A/G Ratio: 1 (ref 0.7–1.7)
Albumin ELP: 3.2 g/dL (ref 2.9–4.4)
Alpha-1-Globulin: 0.3 g/dL (ref 0.0–0.4)
Alpha-2-Globulin: 0.9 g/dL (ref 0.4–1.0)
Beta Globulin: 1 g/dL (ref 0.7–1.3)
Gamma Globulin: 1 g/dL (ref 0.4–1.8)
Globulin, Total: 3.2 g/dL (ref 2.2–3.9)
Total Protein ELP: 6.4 g/dL (ref 6.0–8.5)

## 2023-05-28 ENCOUNTER — Encounter: Payer: Self-pay | Admitting: Cardiovascular Disease

## 2023-05-30 DIAGNOSIS — G4736 Sleep related hypoventilation in conditions classified elsewhere: Secondary | ICD-10-CM | POA: Diagnosis not present

## 2023-05-31 ENCOUNTER — Other Ambulatory Visit: Payer: Self-pay | Admitting: Cardiovascular Disease

## 2023-05-31 MED ORDER — DOXAZOSIN MESYLATE 2 MG PO TABS: 2.0000 mg | ORAL_TABLET | Freq: Two times a day (BID) | ORAL | 3 refills | Status: DC

## 2023-05-31 MED ORDER — HYDRALAZINE HCL 100 MG PO TABS: 100.0000 mg | ORAL_TABLET | Freq: Three times a day (TID) | ORAL | 3 refills | Status: DC

## 2023-06-06 ENCOUNTER — Telehealth (HOSPITAL_BASED_OUTPATIENT_CLINIC_OR_DEPARTMENT_OTHER): Payer: Self-pay | Admitting: Pulmonary Disease

## 2023-06-06 DIAGNOSIS — G4734 Idiopathic sleep related nonobstructive alveolar hypoventilation: Secondary | ICD-10-CM

## 2023-06-06 NOTE — Telephone Encounter (Signed)
ONO order has been sent to [facility]

## 2023-06-06 NOTE — Telephone Encounter (Signed)
Overnight Oximetry Results Date: 05/26/23 SpO2 <88% 3 hour 1 min 29 sec. Unclear if on room air or if on CPAP on room air Nadir SpO2 73%  Qualified for oxygen  Assessment/Plan Nocturnal Hypoxemia --Updated patient on phone. She is unable to confirm if she was on CPAP during testing. Would like to hold on nocturnal oxygen until this is confirmed and willing to retest if needed --ORDER ONO on CPAP on ROOM AIR ASAP --Obtain CPAP compliance records for the month of Feb 2025 ASAP

## 2023-06-14 ENCOUNTER — Telehealth: Payer: Self-pay

## 2023-06-14 DIAGNOSIS — R768 Other specified abnormal immunological findings in serum: Secondary | ICD-10-CM

## 2023-06-14 DIAGNOSIS — Z9884 Bariatric surgery status: Secondary | ICD-10-CM

## 2023-06-14 NOTE — Telephone Encounter (Signed)
Called and spoke to patient about results.  She has history of gastric bypass.  Her ferritin and B12 levels were adequate.  We will proceed with B12 injection monthly.  She may schedule any day.  Elevated kappa and lambda with normal ratio.  This is not a concern at this time.  Level has been lower than before.  Recommend follow-up with next blood draw in about 3 months.   Please schedule for B12 injection anytime, continue monthly.  Please schedule for lab 1 to 2 days before next visit with me in about 3 months.

## 2023-06-17 DIAGNOSIS — R195 Other fecal abnormalities: Secondary | ICD-10-CM | POA: Diagnosis not present

## 2023-06-26 ENCOUNTER — Other Ambulatory Visit: Payer: Self-pay | Admitting: Cardiovascular Disease

## 2023-07-02 ENCOUNTER — Encounter: Payer: Self-pay | Admitting: Pulmonary Disease

## 2023-07-05 DIAGNOSIS — R0902 Hypoxemia: Secondary | ICD-10-CM | POA: Diagnosis not present

## 2023-07-05 DIAGNOSIS — G4730 Sleep apnea, unspecified: Secondary | ICD-10-CM | POA: Diagnosis not present

## 2023-07-08 DIAGNOSIS — N179 Acute kidney failure, unspecified: Secondary | ICD-10-CM | POA: Diagnosis not present

## 2023-07-08 DIAGNOSIS — N184 Chronic kidney disease, stage 4 (severe): Secondary | ICD-10-CM | POA: Diagnosis not present

## 2023-07-08 DIAGNOSIS — R799 Abnormal finding of blood chemistry, unspecified: Secondary | ICD-10-CM | POA: Diagnosis not present

## 2023-07-08 DIAGNOSIS — N390 Urinary tract infection, site not specified: Secondary | ICD-10-CM | POA: Diagnosis not present

## 2023-07-09 ENCOUNTER — Telehealth (HOSPITAL_BASED_OUTPATIENT_CLINIC_OR_DEPARTMENT_OTHER): Payer: Self-pay | Admitting: Pulmonary Disease

## 2023-07-09 DIAGNOSIS — G4734 Idiopathic sleep related nonobstructive alveolar hypoventilation: Secondary | ICD-10-CM

## 2023-07-09 DIAGNOSIS — G4733 Obstructive sleep apnea (adult) (pediatric): Secondary | ICD-10-CM

## 2023-07-09 NOTE — Telephone Encounter (Signed)
 Overnight Oximetry Results Date: 07/05/23 SpO2 <88% 0 hour 43 min 49 sec on CPAP on Room Air Nadir SpO2 72%  Qualified for oxygen  Assessment/Plan OSA on CPAP with Nocturnal Hypoxemia  Remains hypoxemic despite auto CPAP 5-20 --Discussed PAP titration vs oxygen supplementation. She continues to have fatigue and now with desaturations on CPAP, I am concerned that she needs PAP titration to optimize her settings +/- oxygen supplementation --ORDER PAP titration

## 2023-07-09 NOTE — Telephone Encounter (Signed)
 Order placed

## 2023-07-10 ENCOUNTER — Encounter (HOSPITAL_BASED_OUTPATIENT_CLINIC_OR_DEPARTMENT_OTHER): Payer: Self-pay | Admitting: Pulmonary Disease

## 2023-07-10 DIAGNOSIS — G4733 Obstructive sleep apnea (adult) (pediatric): Secondary | ICD-10-CM

## 2023-07-10 DIAGNOSIS — G4734 Idiopathic sleep related nonobstructive alveolar hypoventilation: Secondary | ICD-10-CM

## 2023-07-10 NOTE — Telephone Encounter (Signed)
**Note De-identified  [place] Obfuscation** Please advise 

## 2023-07-10 NOTE — Telephone Encounter (Signed)
 OK to order 1L O2 to be bled in with CPAP. Nocturnal O2. New O2 start.

## 2023-07-10 NOTE — Telephone Encounter (Signed)
 Order placed

## 2023-07-16 ENCOUNTER — Ambulatory Visit (HOSPITAL_BASED_OUTPATIENT_CLINIC_OR_DEPARTMENT_OTHER): Attending: Pulmonary Disease | Admitting: Internal Medicine

## 2023-07-16 VITALS — Ht 65.0 in | Wt 218.0 lb

## 2023-07-16 DIAGNOSIS — G4733 Obstructive sleep apnea (adult) (pediatric): Secondary | ICD-10-CM | POA: Diagnosis not present

## 2023-07-16 DIAGNOSIS — G4734 Idiopathic sleep related nonobstructive alveolar hypoventilation: Secondary | ICD-10-CM | POA: Diagnosis present

## 2023-07-18 DIAGNOSIS — N2581 Secondary hyperparathyroidism of renal origin: Secondary | ICD-10-CM | POA: Diagnosis not present

## 2023-07-18 DIAGNOSIS — N189 Chronic kidney disease, unspecified: Secondary | ICD-10-CM | POA: Diagnosis not present

## 2023-07-18 DIAGNOSIS — D631 Anemia in chronic kidney disease: Secondary | ICD-10-CM | POA: Diagnosis not present

## 2023-07-18 DIAGNOSIS — N184 Chronic kidney disease, stage 4 (severe): Secondary | ICD-10-CM | POA: Diagnosis not present

## 2023-07-18 DIAGNOSIS — I129 Hypertensive chronic kidney disease with stage 1 through stage 4 chronic kidney disease, or unspecified chronic kidney disease: Secondary | ICD-10-CM | POA: Diagnosis not present

## 2023-07-20 DIAGNOSIS — G4734 Idiopathic sleep related nonobstructive alveolar hypoventilation: Secondary | ICD-10-CM

## 2023-07-20 NOTE — Procedures (Signed)
 [facility] [address] [place], [place] [address] Tel: [phone]   Fax: [phone]  Titration Interpretation  Patient Name:  Lynn Joseph, Lynn Joseph Date:  07/16/2023 Referring Physician:  Dr. Oliver Pila Ellisin  Indications for Polysomnography The patient is a 78 year old Female who is 5\' 5"  and weighs 218.0 lbs. Her BMI equals 36.3.  A full night titration treatment study was performed.  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   Medication were taken at 2145  Nabivolol HCL  Cardura   Polysomnogram Data A full night polysomnogram recorded the standard physiologic parameters including EEG, EOG, EMG, EKG, nasal and oral airflow.  Respiratory parameters of chest and abdominal movements were recorded with Respiratory Inductance Plethysmography belts.  Oxygen saturation was recorded by pulse oximetry.   Sleep Architecture The total recording time of the polysomnogram was 378.6 minutes.  The total sleep time was 343.5 minutes.  The patient spent 1.9% of total sleep time in Stage N1, 67.7% in Stage N2, 7.4% in Stages N3, and 23.0% in REM.  Sleep latency was 27.0 minutes.  REM latency was 115.5 minutes.  Sleep Efficiency was 90.7%.  Wake after Sleep Onset time was 8.0 minutes.  Titration Summary The patient was titrated at pressures ranging from 6* cm/H20 with supplemental oxygen at - up to 23/19/0** cm/H20 with supplemental oxygen at -.  The last pressure used in the study was 23/19/0** cm/H20 with supplemental oxygen at -.  Respiratory Events The polysomnogram revealed a presence of 13 obstructive, 2 centrals, and - mixed apneas resulting in an Apnea index of 2.6 events per hour.  There were 213 hypopneas (>=3% desaturation and/or arousal) resulting in an Apnea\Hypopnea Index (AHI >=3% desaturation and/or arousal)  of 39.8 events per hour.  There were 138 hypopneas (>=4% desaturation) resulting in an Apnea\Hypopnea Index (AHI >=4% desaturation) of 26.7 events per hour.  There were 19 Respiratory Effort Related Arousals resulting in a RERA index of 3.3 events per hour. The Respiratory Disturbance Index is 43.1 events per hour.  The snore index was 191.8 events per hour.  Mean oxygen saturation was 92.7%.  The lowest oxygen saturation during sleep was 76.0%.  Time spent <=88% oxygen saturation was 19.9 minutes (5.3%).  Limb Activity There were - limb movements recorded.  Of this total, - were classified as PLMs.  Of the PLMs, - were associated with arousals.  The Limb Movement index was - per hour while the PLM index was - per hour.  Cardiac Summary The average pulse rate was 57.0 bpm.  The minimum pulse rate was 51.0 bpm while the maximum pulse rate was 81.0 bpm.  Cardiac rhythm was normal with occasional PAC and PVC.Marland Kitchen  Comment: CPAP titration did not provide adequate control at tolerated pressures and was changed to BIPAP titration to 23/19 with residual AHI (4%) 0/hr. On BiPAP 23/19 minimum O2 saturation 90% and mean 94%.  Diagnosis: Obstructive sleep apnea  Recommendations: Trial of BIPAP 23/19, PS 2. Patient wore an extra-small F&P Evora full face mask with heated humidity and heated tubing.   This study was personally reviewed and electronically signed by: Dr. Jetty Duhamel Accredited Board Certified in Sleep Medicine Date/Time: 07/20/23  2: 36 PM   Titration Report  Patient Name: Lynn Joseph, Lynn Joseph Date: 07/16/2023  Date of Birth: 10-Nov-1945 Study Type: CPAP Titration  Age: 11 year MRN #: [id number]  Sex: Female Interpreting Physician: Jetty Duhamel [id number]  Height: 5\' 5"  Referring Physician: Dr. Luciano Cutter  Weight: 218.0 lbs Recording Tech: Ulyess Mort RRT RPSGT RST  BMI: 36.3 Scoring Tech: Ulyess Mort RRT RPSGT RST  ESS: 7 Neck Size: 16  Mask Type [employer] Evora  Full Final Pressure: 23/19 CMH2O  Mask Size: Extra Small Supplemental O2: -   Study Overview  Lights Off: 10:35:34 PM  Count Index  Lights On: 04:54:08 AM Awakenings: 6 1.0  Time in Bed: 378.6 min. Arousals: 68 11.9  Total Sleep Time: 343.5 min. AHI (>=3% Desat and/or Ar.): 228 39.8   Sleep Efficiency: 90.7% AHI (>=4% Desat): 153 26.7   Sleep Latency: 27.0 min. Limb Movements: - -  Wake After Sleep Onset: 8.0 min. Snore: 1098 191.8  REM Latency from Sleep Onset: 115.5 min. Desaturations: 234 40.9     Minimum SpO2 TST: 76.0%    Sleep Architecture  % of Time in Bed Stages Time (mins) % Sleep Time  Wake 35.5   Stage N1 6.5 1.9%  Stage N2 232.5 67.7%  Stage N3 25.5 7.4%  REM 79.0 23.0%   Arousal Summary   NREM REM Sleep Index  Respiratory Arousals 33 3 36 6.3  PLM Arousals - - - -  Isolated Limb Movement Arousals - - - -  Snore Arousals 7 1 8  1.4  Spontaneous Arousals 21 3 24  4.2  Total 61 7 68 11.9   Limb Movement Summary   Count Index  Isolated Limb Movements - -  Periodic Limb Movements (PLMs) - -  Total Limb Movements - -  Respiratory Summary   By Sleep Stage By Body Position Total   NREM REM Supine Non-Supine   Time (min) 264.5 79.0 281.0 62.5 343.5         Obstructive Apnea 4 9 13  - 13  Mixed Apnea - - - - -  Central Apnea - 2 2 - 2  Total Apneas 4 11 15  - 15  Total Apnea Index 0.9 8.4 3.2 - 2.6         Hypopneas (>=3% Desat and/or Ar.) 173 40 198 15 213  AHI (>=3% Desat and/or Ar.) 40.2 38.7 45.5 14.4 39.8         Hypopneas (>=4% Desat) 111 27 133 5 138  AHI (>=4% Desat) 26.1 28.9 31.6 4.8 26.7          RERAs 16 3 18 1 19   RERA Index 3.6 2.3 3.8 1.0 3.3         RDI 43.8 41.0 49.3 15.4 43.1     Respiratory Event Durations   Apnea Hypopnea   NREM REM NREM REM  Average (seconds) 18.7 21.7 25.3 28.6  Maximum (seconds) 21.6 41.8 105.1 81.6    Oxygen Saturation Summary   Wake NREM REM TST TIB  Average SpO2 95.3% 92.6% 91.7% 92.4% 92.7%  Minimum  SpO2 91.0% 76.0% 77.0% 76.0% 76.0%  Maximum SpO2 98.0% 97.0% 97.0% 97.0% 98.0%   Oxygen Saturation Distribution  Range (%) Time in range (min) Time in range (%)  90.0 - 100.0 328.0 86.7%  80.0 - 90.0 49.2 13.0%  70.0 - 80.0 1.1 0.3%  60.0 - 70.0 - -  50.0 - 60.0 - -  0.0 - 50.0 - -  Time Spent <=88% SpO2  Range (%) Time in range (min) Time in range (%)  0.0 - 88.0 19.9 5.3%      Count Index  Desaturations 234 40.9  Cardiac Summary   Wake NREM REM Sleep Total  Average Pulse Rate (BPM) 63.0 56.2 57.0 56.4 57.0  Minimum Pulse Rate (BPM) 53.0 52.0 51.0 51.0 51.0  Maximum Pulse Rate (BPM) 81.0 65.0 65.0 65.0 81.0   Pulse Rate Distribution:  Range (bpm) Time in range (min) Time in range (%)  0.0 - 40.0 - -  40.0 - 60.0 333.5 88.0%  60.0 - 80.0 45.5 12.0%  80.0 - 100.0 0.0 0.0%  100.0 - 120.0 - -  120.0 - 140.0 - -  140.0 - 200.0 - -   Titration Summary  PAP Device PAP Level O2 Level Time (min) Wake (min) NREM (min) REM (min) Sleep Eff% OA# CA# MA# Hyp# (>=3%) AHI (>=3%) Hyp# (>=4%) AHI (>=%4) RERA RDI SpO2 <=88% (min) Min SpO2 Mean SpO2 Ar. Index  CPAP 6 - 43.0 27.5 15.5 0.0 36.0% - - - 16 61.9 7  27.1 4  77.4  0.0 88.0 92.3 54.2  CPAP 8 - 80.0 0.0 80.0 0.0 100.0% 2 - - 99 75.8 68  52.5 4  78.8  2.8 84.0 91.7 20.3  CPAP 10 - 12.0 0.0 12.0 0.0 100.0% - - - 18 90.0 14  70.0 1  95.0  0.7 87.0 92.0 15.0  CPAP 12 - 39.5 0.0 9.5 30.0 100.0% 10 1 - 33 66.8 28  59.2 1  68.4  6.9 76.0 91.9 4.6  CPAP 14 - 85.5 3.5 76.0 6.0 95.9% 1 - - 19 14.6 6  5.1 7  19.8  1.3 81.0 92.9 8.8  CPAP 16 - 33.5 0.0 33.5 0.0 100.0% - - - 8 14.3 5  9.0 -  14.3  0.0 91.0 93.1 1.8  CPAP 17 - 44.0 2.0 23.0 19.0 95.5% - 1 - 17 25.7 10  15.7 1  27.1  8.2 81.0 91.7 5.7  BiLevel 21/17/0 - 10.5 0.0 0.0 10.5 100.0% - - - 2 11.4 -  - -  11.4  0.1 88.0 92.8 -  BiLevel 23/19/0 - 31.0 2.5 15.0 13.5 91.9% - - - 1 2.1 -  - 1  4.2  0.0 90.0 94.0 8.4     Hypnograms                           Comments  Patient was ordered as a CPAP titration. Patient is a 78 year old white female who was sent to the sleep center for OSA and nocturnal hypoxemia. Patient was placed on CPAP of 6 cmh2o with 2 cmh2o of EPR at 10:35 pm and was increased to a Bilevel pressure of 23/19 cmh2o with no audible snoring noted. Patient was increased for respiratory events with and without a 4% desaturation or arousal, and audible snoring. Patient tolerated CPAP/Bilevel trial fairly well. No oxygen was applied Patient reported taking her medications at 9:45 pm. A few PLM's/PLMA's were noted. Questionable cardiac arrhythmias were noted see epochs for examples: 31, 33, 84, 163, 169, 184, 269, 328, etc. No restroom visit was noted. Patient was fitted with a [employer] Paykel Evora Full nasal/oral full-face mask size extra small.  -Tech                             Keziah Drotar [employer], Biomedical engineer of Sleep Medicine  ELECTRONICALLY SIGNED ON:  07/20/2023, 2:24 PM [facility] PH: [phone]   FX: [phone] ACCREDITED BY THE AMERICAN ACADEMY OF SLEEP MEDICINE

## 2023-07-21 NOTE — Telephone Encounter (Signed)
 Please place orders to change from CPAP to BiPAP with the following settings and equipment:  BIPAP 23/19, PS 2. Patient wore an extra-small F&P Evora full face mask with heated humidity and heated tubing.

## 2023-07-24 ENCOUNTER — Encounter: Payer: Self-pay | Admitting: Pulmonary Disease

## 2023-07-24 ENCOUNTER — Other Ambulatory Visit (HOSPITAL_COMMUNITY): Payer: Self-pay | Admitting: *Deleted

## 2023-07-24 DIAGNOSIS — E785 Hyperlipidemia, unspecified: Secondary | ICD-10-CM | POA: Diagnosis not present

## 2023-07-24 DIAGNOSIS — I5030 Unspecified diastolic (congestive) heart failure: Secondary | ICD-10-CM | POA: Diagnosis not present

## 2023-07-24 DIAGNOSIS — D509 Iron deficiency anemia, unspecified: Secondary | ICD-10-CM | POA: Diagnosis not present

## 2023-07-24 DIAGNOSIS — N184 Chronic kidney disease, stage 4 (severe): Secondary | ICD-10-CM | POA: Diagnosis not present

## 2023-07-24 DIAGNOSIS — M8580 Other specified disorders of bone density and structure, unspecified site: Secondary | ICD-10-CM | POA: Diagnosis not present

## 2023-07-24 DIAGNOSIS — E039 Hypothyroidism, unspecified: Secondary | ICD-10-CM | POA: Diagnosis not present

## 2023-07-24 DIAGNOSIS — I130 Hypertensive heart and chronic kidney disease with heart failure and stage 1 through stage 4 chronic kidney disease, or unspecified chronic kidney disease: Secondary | ICD-10-CM | POA: Diagnosis not present

## 2023-07-24 DIAGNOSIS — M109 Gout, unspecified: Secondary | ICD-10-CM | POA: Diagnosis not present

## 2023-07-24 DIAGNOSIS — E538 Deficiency of other specified B group vitamins: Secondary | ICD-10-CM | POA: Diagnosis not present

## 2023-07-24 DIAGNOSIS — D649 Anemia, unspecified: Secondary | ICD-10-CM | POA: Diagnosis not present

## 2023-07-25 ENCOUNTER — Ambulatory Visit (HOSPITAL_COMMUNITY)
Admission: RE | Admit: 2023-07-25 | Discharge: 2023-07-25 | Disposition: A | Discharge status: Home / Self Care | Source: Ambulatory Visit | Attending: Nephrology | Admitting: Nephrology

## 2023-07-25 DIAGNOSIS — D509 Iron deficiency anemia, unspecified: Secondary | ICD-10-CM | POA: Diagnosis not present

## 2023-07-25 MED ORDER — FERUMOXYTOL INJECTION 510 MG/17 ML
510.0000 mg | INTRAVENOUS | Status: DC
  Administered 2023-07-25: 510 mg via INTRAVENOUS
  Filled 2023-07-25: qty 510

## 2023-07-30 DIAGNOSIS — E039 Hypothyroidism, unspecified: Secondary | ICD-10-CM | POA: Diagnosis not present

## 2023-07-30 DIAGNOSIS — I10 Essential (primary) hypertension: Secondary | ICD-10-CM | POA: Diagnosis not present

## 2023-07-30 DIAGNOSIS — Z0000 Encounter for general adult medical examination without abnormal findings: Secondary | ICD-10-CM | POA: Diagnosis not present

## 2023-07-30 DIAGNOSIS — Z1339 Encounter for screening examination for other mental health and behavioral disorders: Secondary | ICD-10-CM | POA: Diagnosis not present

## 2023-07-30 DIAGNOSIS — I480 Paroxysmal atrial fibrillation: Secondary | ICD-10-CM | POA: Diagnosis not present

## 2023-07-30 DIAGNOSIS — N184 Chronic kidney disease, stage 4 (severe): Secondary | ICD-10-CM | POA: Diagnosis not present

## 2023-07-30 DIAGNOSIS — L989 Disorder of the skin and subcutaneous tissue, unspecified: Secondary | ICD-10-CM | POA: Diagnosis not present

## 2023-07-30 DIAGNOSIS — Z1331 Encounter for screening for depression: Secondary | ICD-10-CM | POA: Diagnosis not present

## 2023-07-30 DIAGNOSIS — I5030 Unspecified diastolic (congestive) heart failure: Secondary | ICD-10-CM | POA: Diagnosis not present

## 2023-07-30 DIAGNOSIS — G4733 Obstructive sleep apnea (adult) (pediatric): Secondary | ICD-10-CM | POA: Diagnosis not present

## 2023-07-30 DIAGNOSIS — J90 Pleural effusion, not elsewhere classified: Secondary | ICD-10-CM | POA: Diagnosis not present

## 2023-07-30 DIAGNOSIS — I130 Hypertensive heart and chronic kidney disease with heart failure and stage 1 through stage 4 chronic kidney disease, or unspecified chronic kidney disease: Secondary | ICD-10-CM | POA: Diagnosis not present

## 2023-07-30 DIAGNOSIS — E785 Hyperlipidemia, unspecified: Secondary | ICD-10-CM | POA: Diagnosis not present

## 2023-07-30 DIAGNOSIS — N2581 Secondary hyperparathyroidism of renal origin: Secondary | ICD-10-CM | POA: Diagnosis not present

## 2023-07-30 DIAGNOSIS — J9611 Chronic respiratory failure with hypoxia: Secondary | ICD-10-CM | POA: Diagnosis not present

## 2023-08-01 ENCOUNTER — Encounter (HOSPITAL_COMMUNITY)
Admission: RE | Admit: 2023-08-01 | Discharge: 2023-08-01 | Disposition: A | Discharge status: Home / Self Care | Source: Ambulatory Visit | Attending: Nephrology | Admitting: Nephrology

## 2023-08-01 DIAGNOSIS — D509 Iron deficiency anemia, unspecified: Secondary | ICD-10-CM | POA: Insufficient documentation

## 2023-08-01 MED ORDER — SODIUM CHLORIDE 0.9 % IV SOLN
510.0000 mg | INTRAVENOUS | Status: DC
  Administered 2023-08-01: 510 mg via INTRAVENOUS
  Filled 2023-08-01: qty 510

## 2023-08-17 DIAGNOSIS — I4891 Unspecified atrial fibrillation: Secondary | ICD-10-CM | POA: Diagnosis not present

## 2023-08-17 DIAGNOSIS — I959 Hypotension, unspecified: Secondary | ICD-10-CM | POA: Diagnosis not present

## 2023-08-17 DIAGNOSIS — R55 Syncope and collapse: Secondary | ICD-10-CM | POA: Diagnosis not present

## 2023-08-19 ENCOUNTER — Encounter: Payer: Self-pay | Admitting: Cardiology

## 2023-08-19 ENCOUNTER — Ambulatory Visit: Attending: Cardiology | Admitting: Cardiology

## 2023-08-19 ENCOUNTER — Encounter: Payer: Self-pay | Admitting: Cardiovascular Disease

## 2023-08-19 VITALS — BP 163/75 | HR 60 | Ht 65.0 in | Wt 206.9 lb

## 2023-08-19 DIAGNOSIS — I480 Paroxysmal atrial fibrillation: Secondary | ICD-10-CM | POA: Diagnosis not present

## 2023-08-19 DIAGNOSIS — R42 Dizziness and giddiness: Secondary | ICD-10-CM

## 2023-08-19 DIAGNOSIS — I10 Essential (primary) hypertension: Secondary | ICD-10-CM | POA: Diagnosis not present

## 2023-08-19 NOTE — Patient Instructions (Signed)
 Medication Instructions:  Your physician recommends the following medication changes.  STOP TAKING: Cardura    START TAKING: Hydralazine  25 mg Bystolic  20 mg twice a day    *If you need a refill on your cardiac medications before your next appointment, please call your pharmacy*  Lab Work: No labs ordered today  If you have labs (blood work) drawn today and your tests are completely normal, you will receive your results only by: MyChart Message (if you have MyChart) OR A paper copy in the mail If you have any lab test that is abnormal or we need to change your treatment, we will call you to review the results.  Testing/Procedures: No test ordered today   Follow-Up: At [facility], you and your health needs are our priority.  As part of our continuing mission to provide you with exceptional heart care, our providers are all part of one team.  This team includes your primary Cardiologist (physician) and Advanced Practice Providers or APPs (Physician Assistants and Nurse Practitioners) who all work together to provide you with the care you need, when you need it.  Your next appointment:   3 month(s)  Provider:   You may see Timothy Gollan, MD or one of the following Advanced Practice Providers on your designated Care Team:   Laneta Pintos, NP Gildardo Labrador, PA-C Varney Gentleman, PA-C Cadence Staley, PA-C Ronald Cockayne, NP Morey Ar, NP    We recommend signing up for the patient portal called "MyChart".  Sign up information is provided on this After Visit Summary.  MyChart is used to connect with patients for Virtual Visits (Telemedicine).  Patients are able to view lab/test results, encounter notes, upcoming appointments, etc.  Non-urgent messages can be sent to your provider as well.   To learn more about what you can do with MyChart, go to [email or web address].

## 2023-08-19 NOTE — Telephone Encounter (Signed)
 Pt called and reports "feeling bad" on Friday and near syncope event, BP attached to note, pt reports NOT taking any Lasix  or BP meds yesterday and today.  Started Wegovy 2 weeks prior and has lost approx 10 pounds since.    Because of this same day appt offered.

## 2023-08-19 NOTE — Progress Notes (Signed)
 Cardiology Office Note:    Date:  08/19/2023   ID:  Lynn Joseph, DOB 07/10/1945, MRN [id number]  PCP:  Barnetta Liberty, MD   [facility] Providers Cardiologist:  Belva Boyden, MD     Referring MD: Barnetta Liberty, MD   Chief Complaint  Patient presents with   Near Syncope    Patient states that she feels a little dizzy. Patient states that she has not taken in BP meds or Lasix  since Saturday. Meds reviewed. Patient mentioned that she had iron  infusion about two weeks ago.    History of Present Illness:    Lynn Joseph is a 78 y.o. female with a hx of hypertension, atrial fibrillation, CKD hypothyroidism, who presents due to dizziness, syncope.  Had an episode of presyncope 2 days ago.  States starting GLP-1 resulting in at least 10 pounds weight loss.  She felt dizzy 2 days ago and almost passed out.  She was with family.  Blood pressure check at the time showed systolic in the low 100s.  She stopped taking all her BP meds over the past 48 hours, BPs at home have ranged in the 130s to 140s systolic.  Prior notes/testing Echocardiogram 03/09/2022 EF 55 to 60%  Past Medical History:  Diagnosis Date   Anemia of chronic disease    Chronic diarrhea    Glaucoma    HTN (hypertension)    Hypertension    Hypothyroid    Obesity    Skin cancer     Past Surgical History:  Procedure Laterality Date   CHOLECYSTECTOMY N/A 03/11/2022   Procedure: LAPAROSCOPIC CHOLECYSTECTOMY;  Surgeon: Lujean Sake, MD;  Location: [facility] OR;  Service: General;  Laterality: N/A;   GASTRIC BYPASS     GASTRIC RESTRICTION SURGERY     IR REMOVAL BILIARY DRAIN  04/06/2022   IR SINUS/FIST TUBE CHK-NON GI  03/09/2022   RENAL ANGIOGRAPHY N/A 06/20/2022   Procedure: RENAL ANGIOGRAPHY;  Surgeon: Wenona Hamilton, MD;  Location: [facility] INVASIVE CV LAB;  Service: Cardiovascular;  Laterality: N/A;   TUBAL LIGATION      Current Medications: Current Meds  Medication Sig    Calcium -Magnesium -Zinc (CALCIUM -MAGNESUIUM-ZINC PO) Take 1 tablet by mouth 2 (two) times a week.   colchicine 0.6 MG tablet Take 0.3 mg by mouth daily as needed (AS DIRECTED for gout flares).   febuxostat  (ULORIC ) 40 MG tablet Take 40 mg by mouth daily.   ferrous sulfate  325 (65 FE) MG tablet Take 1 tablet (325 mg total) by mouth 2 (two) times daily with a meal.   furosemide  (LASIX ) 20 MG tablet Take 20 mg by mouth daily as needed (for a weight gain of 4-5 pounds in the period of a couple of days).   gabapentin  (NEURONTIN ) 100 MG capsule Take 100 mg by mouth at bedtime.   hydrALAZINE  (APRESOLINE ) 25 MG tablet Take 50 mg by mouth daily.   Levothyroxine  Sodium (TIROSINT ) 100 MCG CAPS Take 1 capsule (100 mcg total) by mouth daily before breakfast.   Nebivolol  HCl (BYSTOLIC ) 20 MG TABS Take 1 tablet (20 mg total) by mouth 2 (two) times daily.   rosuvastatin  (CRESTOR ) 5 MG tablet TAKE 1 TABLET(5 MG) BY MOUTH DAILY   TYLENOL  500 MG tablet Take 1,000 mg by mouth every 6 (six) hours as needed for mild pain (pain score 1-3), headache or fever.   WEGOVY 0.25 MG/0.5ML SOAJ Inject 0.25 mg into the skin once a week.   [DISCONTINUED] doxazosin  (CARDURA ) 2 MG tablet Take  1 tablet (2 mg total) by mouth 2 (two) times daily.     Allergies:   Amlodipine, Carvedilol , and Penicillin g sodium   Social History   Socioeconomic History   Marital status: Widowed    Spouse name: Not on file   Number of children: Not on file   Years of education: Not on file   Highest education level: Not on file  Occupational History   Not on file  Tobacco Use   Smoking status: Never   Smokeless tobacco: Never  Vaping Use   Vaping status: Never Used  Substance and Sexual Activity   Alcohol  use: Not Currently    Comment: 1 glass of wine weekly   Drug use: No   Sexual activity: Not on file  Other Topics Concern   Not on file  Social History Narrative   ** Merged History Encounter **       Drinks 1-2 cups of caffeine  daily.   Social Drivers of Corporate investment banker Strain: Not on file  Food Insecurity: No Food Insecurity (04/13/2023)   Hunger Vital Sign    Worried About Running Out of Food in the Last Year: Never true    Ran Out of Food in the Last Year: Never true  Transportation Needs: No Transportation Needs (04/13/2023)   PRAPARE - Administrator, Civil Service (Medical): No    Lack of Transportation (Non-Medical): No  Physical Activity: Not on file  Stress: Not on file  Social Connections: Not on file     Family History: The patient's family history includes Diabetes in her father; Hypertension in her mother; Kidney disease in her father; Stroke in her mother; Thyroid  disease in her mother.  ROS:   Please see the history of present illness.     All other systems reviewed and are negative.  EKGs/Labs/Other Studies Reviewed:    The following studies were reviewed today:   Recent Labs: 03/30/2023: B Natriuretic Peptide 311.9; Magnesium  1.4 04/11/2023: TSH 4.193 05/17/2023: ALT 6; BUN 31; Creatinine 1.90; Hemoglobin 8.9; Platelet Count 208; Potassium 4.2; Sodium 141  Recent Lipid Panel    Component Value Date/Time   CHOL 160 10/28/2018 1758   TRIG 64 10/28/2018 1758   HDL 55 10/28/2018 1758   CHOLHDL 2.9 10/28/2018 1758   VLDL 13 10/28/2018 1758   LDLCALC 92 10/28/2018 1758     Risk Assessment/Calculations:          Physical Exam:    VS:  BP (!) 163/75   Pulse 60   Ht 5\' 5"  (1.651 m)   Wt 206 lb 14.2 oz (93.8 kg)   BMI 34.43 kg/m     Wt Readings from Last 3 Encounters:  08/19/23 206 lb 14.2 oz (93.8 kg)  07/25/23 215 lb (97.5 kg)  07/16/23 218 lb (98.9 kg)     GEN:  Well nourished, well developed in no acute distress HEENT: Normal NECK: No JVD; No carotid bruits CARDIAC: RRR, no murmurs, rubs, gallops RESPIRATORY:  Clear to auscultation without rales, wheezing or rhonchi  ABDOMEN: Soft, non-tender, non-distended MUSCULOSKELETAL:  No edema; No  deformity  SKIN: Warm and dry NEUROLOGIC:  Alert and oriented x 3 PSYCHIATRIC:  Normal affect   ASSESSMENT:    1. Dizziness   2. Primary hypertension   3. Paroxysmal atrial fibrillation (HCC)    PLAN:    In order of problems listed above:  Dizziness, presyncope.  Due to low normal BP.  Resolved with stopping medications  but now patient is hypertensive. Hypertension, BP elevated.  Restart Nebivolol  20 mg 1 tab twice daily, hydralazine  25 mg twice daily.  Hold Cardura  for now due to orthostatic effect.  Check BP at home and keep log.  If BP stays elevated, titrate hydralazine  further.   Patient has allergies to Coreg , Norvasc.  Not on HCTZ due to renal dysfunction. Paroxysmal atrial fibrillation, maintaining sinus rhythm, continue nebivolol .  No anticoagulation due to history of anemia requiring blood transfusions.  Follow-up in 4 to 6 weeks with Dr. Gollan or APP.      Medication Adjustments/Labs and Tests Ordered: Current medicines are reviewed at length with the patient today.  Concerns regarding medicines are outlined above.  No orders of the defined types were placed in this encounter.  No orders of the defined types were placed in this encounter.   Patient Instructions  Medication Instructions:  Your physician recommends the following medication changes.  STOP TAKING: Cardura    START TAKING: Hydralazine  25 mg Bystolic  20 mg twice a day    *If you need a refill on your cardiac medications before your next appointment, please call your pharmacy*  Lab Work: No labs ordered today  If you have labs (blood work) drawn today and your tests are completely normal, you will receive your results only by: MyChart Message (if you have MyChart) OR A paper copy in the mail If you have any lab test that is abnormal or we need to change your treatment, we will call you to review the results.  Testing/Procedures: No test ordered today   Follow-Up: At [facility], you  and your health needs are our priority.  As part of our continuing mission to provide you with exceptional heart care, our providers are all part of one team.  This team includes your primary Cardiologist (physician) and Advanced Practice Providers or APPs (Physician Assistants and Nurse Practitioners) who all work together to provide you with the care you need, when you need it.  Your next appointment:   3 month(s)  Provider:   You may see Timothy Gollan, MD or one of the following Advanced Practice Providers on your designated Care Team:   Laneta Pintos, NP Gildardo Labrador, PA-C Varney Gentleman, PA-C Cadence Tracy, PA-C Ronald Cockayne, NP Morey Ar, NP    We recommend signing up for the patient portal called "MyChart".  Sign up information is provided on this After Visit Summary.  MyChart is used to connect with patients for Virtual Visits (Telemedicine).  Patients are able to view lab/test results, encounter notes, upcoming appointments, etc.  Non-urgent messages can be sent to your provider as well.   To learn more about what you can do with MyChart, go to [email or web address].         Signed, Constancia Delton, MD  08/19/2023 12:40 PM    [facility]

## 2023-08-21 ENCOUNTER — Telehealth: Payer: Self-pay

## 2023-08-21 NOTE — Telephone Encounter (Signed)
 Copied from CRM [id number]. Topic: Clinical - Medical Advice >> Aug 21, 2023  2:26 PM Lynn Joseph wrote: Reason for CRM: Patient states she was in for Joseph sleep study - states they were suppose to switch out her CPAP machine but she hasn't heard back from anyone.   Requesting Joseph call back.

## 2023-08-22 ENCOUNTER — Other Ambulatory Visit: Payer: Self-pay

## 2023-08-22 DIAGNOSIS — G4733 Obstructive sleep apnea (adult) (pediatric): Secondary | ICD-10-CM

## 2023-08-22 DIAGNOSIS — G4734 Idiopathic sleep related nonobstructive alveolar hypoventilation: Secondary | ICD-10-CM

## 2023-08-28 ENCOUNTER — Encounter: Payer: Self-pay | Admitting: Cardiovascular Disease

## 2023-08-28 ENCOUNTER — Other Ambulatory Visit
Admission: RE | Admit: 2023-08-28 | Discharge: 2023-08-28 | Disposition: A | Discharge status: Home / Self Care | Source: Ambulatory Visit | Attending: Medical | Admitting: Medical

## 2023-08-28 ENCOUNTER — Encounter: Payer: Self-pay | Admitting: Medical

## 2023-08-28 ENCOUNTER — Ambulatory Visit: Attending: Medical | Admitting: Medical

## 2023-08-28 VITALS — BP 144/72 | HR 69 | Ht 65.0 in | Wt 205.6 lb

## 2023-08-28 DIAGNOSIS — R600 Localized edema: Secondary | ICD-10-CM | POA: Diagnosis not present

## 2023-08-28 DIAGNOSIS — I480 Paroxysmal atrial fibrillation: Secondary | ICD-10-CM | POA: Insufficient documentation

## 2023-08-28 DIAGNOSIS — Z79899 Other long term (current) drug therapy: Secondary | ICD-10-CM | POA: Diagnosis not present

## 2023-08-28 DIAGNOSIS — R768 Other specified abnormal immunological findings in serum: Secondary | ICD-10-CM | POA: Diagnosis not present

## 2023-08-28 DIAGNOSIS — R079 Chest pain, unspecified: Secondary | ICD-10-CM | POA: Diagnosis not present

## 2023-08-28 DIAGNOSIS — N1832 Chronic kidney disease, stage 3b: Secondary | ICD-10-CM | POA: Diagnosis not present

## 2023-08-28 DIAGNOSIS — Z9884 Bariatric surgery status: Secondary | ICD-10-CM | POA: Diagnosis not present

## 2023-08-28 DIAGNOSIS — R55 Syncope and collapse: Secondary | ICD-10-CM | POA: Insufficient documentation

## 2023-08-28 LAB — BASIC METABOLIC PANEL WITH GFR
Anion gap: 10 (ref 5–15)
BUN: 36 mg/dL — ABNORMAL HIGH (ref 8–23)
CO2: 24 mmol/L (ref 22–32)
Calcium: 9.1 mg/dL (ref 8.9–10.3)
Chloride: 102 mmol/L (ref 98–111)
Creatinine, Ser: 1.77 mg/dL — ABNORMAL HIGH (ref 0.44–1.00)
GFR, Estimated: 29 mL/min — ABNORMAL LOW (ref >60)
Glucose, Bld: 96 mg/dL (ref 70–99)
Potassium: 4.2 mmol/L (ref 3.5–5.1)
Sodium: 136 mmol/L (ref 135–145)

## 2023-08-28 LAB — CBC
HCT: 32.1 % — ABNORMAL LOW (ref 36.0–46.0)
Hemoglobin: 10.8 g/dL — ABNORMAL LOW (ref 12.0–15.0)
MCH: 30.2 pg (ref 26.0–34.0)
MCHC: 33.6 g/dL (ref 30.0–36.0)
MCV: 89.7 fL (ref 80.0–100.0)
Platelets: 147 10*3/uL — ABNORMAL LOW (ref 150–400)
RBC: 3.58 MIL/uL — ABNORMAL LOW (ref 3.87–5.11)
RDW: 13.7 % (ref 11.5–15.5)
WBC: 6.1 10*3/uL (ref 4.0–10.5)
nRBC: 0 % (ref 0.0–0.2)

## 2023-08-28 LAB — TSH: TSH: 0.33 u[IU]/mL — ABNORMAL LOW (ref 0.350–4.500)

## 2023-08-28 LAB — BRAIN NATRIURETIC PEPTIDE: B Natriuretic Peptide: 260.5 pg/mL — ABNORMAL HIGH (ref 0.0–100.0)

## 2023-08-28 LAB — TROPONIN I (HIGH SENSITIVITY): Troponin I (High Sensitivity): 3 ng/L (ref <18)

## 2023-08-28 LAB — MAGNESIUM: Magnesium: 1.6 mg/dL — ABNORMAL LOW (ref 1.7–2.4)

## 2023-08-28 NOTE — Patient Instructions (Signed)
 Medication Instructions:  Take your hydralazine  25 mg twice daily *If you need a refill on your cardiac medications before your next appointment, please call your pharmacy*  Lab Work: STAT TROPONIN, BMET, CBC, BNP, TSH, MAG-TODAY If you have labs (blood work) drawn today and your tests are completely normal, you will receive your results only by: MyChart Message (if you have MyChart) OR A paper copy in the mail If you have any lab test that is abnormal or we need to change your treatment, we will call you to review the results.  Testing/Procedures:   Please report to Radiology at [facility] Main Entrance, medical mall, 30 mins prior to your test.  [address]  [place], [place]  How to Prepare for Your Cardiac PET/CT Stress Test:  Nothing to eat or drink, except water, 3 hours prior to arrival time.  NO caffeine/decaffeinated products, or chocolate 12 hours prior to arrival. (Please note decaffeinated beverages (teas/coffees) still contain caffeine).  If you have caffeine within 12 hours prior, the test will need to be rescheduled.  Medication instructions: Do not take erectile dysfunction medications for 72 hours prior to test (sildenafil, tadalafil) Do not take nitrates (isosorbide mononitrate, Ranexa) the day before or day of test Do not take tamsulosin the day before or morning of test Hold theophylline containing medications for 12 hours. Hold Dipyridamole 48 hours prior to the test.  Diabetic Preparation: If able to eat breakfast prior to 3 hour fasting, you may take all medications, including your insulin. Do not worry if you miss your breakfast dose of insulin - start at your next meal. If you do not eat prior to 3 hour fast-Hold all diabetes (oral and insulin) medications. Patients who wear a continuous glucose monitor MUST remove the device prior to scanning.  You may take your remaining medications with water.  NO perfume, cologne or  lotion on chest or abdomen area. FEMALES - Please avoid wearing dresses to this appointment.  Total time is 1 to 2 hours; you may want to bring reading material for the waiting time.  IF YOU THINK YOU MAY BE PREGNANT, OR ARE NURSING PLEASE INFORM THE TECHNOLOGIST.  In preparation for your appointment, medication and supplies will be purchased.  Appointment availability is limited, so if you need to cancel or reschedule, please call the Radiology Department Scheduler at [phone] 24 hours in advance to avoid a cancellation fee of $100.00  What to Expect When you Arrive:  Once you arrive and check in for your appointment, you will be taken to a preparation room within the Radiology Department.  A technologist or Nurse will obtain your medical history, verify that you are correctly prepped for the exam, and explain the procedure.  Afterwards, an IV will be started in your arm and electrodes will be placed on your skin for EKG monitoring during the stress portion of the exam. Then you will be escorted to the PET/CT scanner.  There, staff will get you positioned on the scanner and obtain a blood pressure and EKG.  During the exam, you will continue to be connected to the EKG and blood pressure machines.  A small, safe amount of a radioactive tracer will be injected in your IV to obtain a series of pictures of your heart along with an injection of a stress agent.    After your Exam:  It is recommended that you eat a meal and drink a caffeinated beverage to counter act any effects of the  stress agent.  Drink plenty of fluids for the remainder of the day and urinate frequently for the first couple of hours after the exam.  Your doctor will inform you of your test results within 7-10 business days.  For more information and frequently asked questions, please visit our website: [email or web address]  For questions about your test or how to prepare for your test, please call: Cardiac  Imaging Nurse Navigators Office: [phone]   Follow-Up: At [facility], you and your health needs are our priority.  As part of our continuing mission to provide you with exceptional heart care, our providers are all part of one team.  This team includes your primary Cardiologist (physician) and Advanced Practice Providers or APPs (Physician Assistants and Nurse Practitioners) who all work together to provide you with the care you need, when you need it.  Your next appointment:   2 month(s)  Provider:   You may see Timothy Gollan, MD or one of the following Advanced Practice Providers on your designated Care Team:   Laneta Pintos, NP Gildardo Labrador, PA-C Varney Gentleman, PA-C Cadence [place], PA-C Ronald Cockayne, NP Morey Ar, NP

## 2023-08-28 NOTE — Telephone Encounter (Signed)
 Called and spoke with patient. Patient states that her blood pressure has been elevated. Patient reports blood pressure of 181/84 and heart rate 72 this morning. Patient states that last night she had a headache and slight chest pressure. Patient reports that she had some some chest pressure this morning as well but not currently. Patient reports that she was seen last week due to her blood pressure being to low and had some dizziness. Patient states that her Cardura  was stopped at that time. Patient states that she is currently taking Hydralazine  25 MG twice daily and Bystolic  20 MG twice daily. Patient scheduled to be seen in clinic today.

## 2023-08-28 NOTE — Progress Notes (Signed)
 Cardiology Office Note:  .   Date:  08/28/2023  ID:  Lynn Joseph, DOB [birth year], MRN [id number] PCP: Barnetta Liberty, MD  [facility] Providers Cardiologist:  Belva Boyden, MD     History of Present Illness: .   Lynn Joseph is a 78 y.o. female with a history of hypertension, A-fib, CKD, hypothyroidism who presents for syncope follow-up.  Echo in November 2023 showed EF 55 to 60%.  Patient saw Dr. Junnie Olives 08/19/2023 with pre-syncope/syncope 2 days prior.  She reported weight loss of 10 pounds on GLP-1.  She noted blood pressure was low with systolics in the low 100s.  She stopped taking her blood pressure medications.  Patient was restarted on Nebivolol , hydralazine .  Cardura  was held.  Patient is not on anticoagulation due to history of anemia requiring blood transfusions.  Today, felt she reports she felt passed out due to low BP. She lost 10lbs quickly. She was dehydrated and she was given IVF. Cardura  was stopped. She stopped taking lasix .  they want off her BP meds so kidney function is better.   She had chest pain last night and took BP at it was high. She took ASA 325 and hydralazine  and she went to bed. She took her meds this AM and took a fluid pill. Still feels head hurts. She still has chest pressure. She is taking lasix  10mg  daily. She is taking nebivolol  20mg  am and pm and hydralazine  25 AM and PM. She is staying hydrated.  Studies Reviewed: Aaron Aas   EKG Interpretation Date/Time:  Wednesday Aug 28 2023 11:18:31 EDT Ventricular Rate:  66 PR Interval:  162 QRS Duration:  80 QT Interval:  418 QTC Calculation: 438 R Axis:   19  Text Interpretation: Normal sinus rhythm Normal ECG When compared with ECG of 20-May-2023 10:29, No significant change was found Confirmed by Gennaro Khat, Shahmeer Bunn ([id number]) on 08/28/2023 11:23:28 AM    Echo 04/2023 1. Left ventricular ejection fraction, by estimation, is 55 to 60%. The  left ventricle has normal function. The left ventricle  has no regional  wall motion abnormalities. Left ventricular diastolic parameters were  normal.   2. Right ventricular systolic function is normal. The right ventricular  size is mildly enlarged.   3. Left atrial size was severely dilated.   4. A small pericardial effusion is present. Moderate pleural effusion in  the left lateral region.   5. The mitral valve is normal in structure. Mild to moderate mitral valve  regurgitation.   6. Tricuspid valve regurgitation is mild to moderate.   7. The aortic valve is tricuspid. Aortic valve regurgitation is not  visualized.   8. The inferior vena cava is normal in size with greater than 50%  respiratory variability, suggesting right atrial pressure of 3 mmHg.   Echo 02/2022  1. Left ventricular ejection fraction, by estimation, is 55 to 60%. The  left ventricle has normal function. The left ventricle has no regional  wall motion abnormalities. Left ventricular diastolic function could not  be evaluated.   2. Right ventricular systolic function is normal. The right ventricular  size is normal. There is normal pulmonary artery systolic pressure. The  estimated right ventricular systolic pressure is 15.8 mmHg.   3. The mitral valve is grossly normal. Trivial mitral valve  regurgitation. No evidence of mitral stenosis.   4. The aortic valve is tricuspid. There is mild calcification of the  aortic valve. Aortic valve regurgitation is not visualized. Aortic valve  sclerosis is  present, with no evidence of aortic valve stenosis.   5. The inferior vena cava is normal in size with greater than 50%  respiratory variability, suggesting right atrial pressure of 3 mmHg.   Comparison(s): Changes from prior study are noted. EF unchanged. Patient  now in Afib.   Heart monitor 01/2022 Event monitor Patch Wear Time:  11 days and 16 hours (2023-09-05T16:41:08-398 to 2023-09-17T09:31:56-0400)   Normal sinus rhythm Patient had a min HR of 50 bpm, max HR  of 171 bpm, and avg HR of 68 bpm.    2 Ventricular Tachycardia runs occurred, the run with the fastest interval lasting 4 beats with a max rate of 132 bpm (avg 102 bpm); the run with the fastest interval was also the longest.    9 Supraventricular Tachycardia runs occurred, the run with the fastest interval lasting 4 beats with a max rate of 171 bpm, the longest lasting 11 beats with an avg rate of 119 bpm.    Isolated SVEs were occasional (1.1%, 12376), SVE Couplets were rare (<1.0%, 106), and SVE Triplets were rare (<1.0%, 26).  Isolated VEs were rare (<1.0%), VE Couplets were rare (<1.0%), and no VE Triplets were present.    Patient triggered events associated with normal sinus rhythm   Signed, Juanda Noon, MD, Ph.D [facility]  MPI 12/2021 Narrative & Impression  Pharmacological myocardial perfusion imaging study with no significant  ischemia Normal wall motion, EF estimated at 59% No EKG changes concerning for ischemia at peak stress or in recovery. CT attenuation correction images with no significant coronary calcification, minimal aortic atherosclerosis Low risk scan     Signed, Juanda Noon, MD, Ph.D [facility]       Physical Exam:   VS:  BP (!) 144/72 (BP Location: Left Arm, Patient Position: Sitting)   Pulse 69   Ht 5\' 5"  (1.651 m)   Wt 205 lb 9.6 oz (93.3 kg)   SpO2 98%   BMI 34.21 kg/m    Wt Readings from Last 3 Encounters:  08/28/23 205 lb 9.6 oz (93.3 kg)  08/19/23 206 lb 14.2 oz (93.8 kg)  07/25/23 215 lb (97.5 kg)    GEN: Well nourished, well developed in no acute distress NECK: No JVD; No carotid bruits CARDIAC: RRR, no murmurs, rubs, gallops RESPIRATORY:  Clear to auscultation without rales, wheezing or rhonchi  ABDOMEN: Soft, non-tender, non-distended EXTREMITIES:  1+ lower leg edema; No deformity   ASSESSMENT AND PLAN: .    Pre-syncope The patient had an episode of pre-sycnope a few weeks ago in the setting of recently taking BP meds and  being dehydrated, and losing 10lbs on GLP-1 found to have low BP by EMS. Cardura  and lasix  were discontinued. She restarted lasix  10mg  daily for lower leg edema. Last night she had chest pain and BP was very high. She took an extra hydralazine  and and ASA with improvement. BP today is mildly elevated at 144/72. She is taking bebivolol 20mg  daily and hydralazine  50mg  daily. Orthostatics are almost positive from sitting to standing. I recommend good hydration. I recommend she take the pm pills together before bed. I will check a CBC, BMET, BNP, TSH and Mag.  Chest pain The patient had chest pain in the setting of high BP. Do not suspect ACS. They are requesting HS trop, this will be drawn STAT. I will order a Cardiac PET stress test. Myoview  stress test in 2023 was normal with no significant coronary artery calcifications.   Paroxysmal Afib Eliquis  previously  stopped due to anemia. It was found this was from malnutrition/iron  deficiency s/p bariatric surgery and she is on iron . Most recent Hgb was 8.9. I will check a CBC, if this is OK, will consult with MD if it's ok to restart Eliquis . CHADSVASC of 4.  CKD stage 3 Scr 1.90 on most recent check. She follows with nephrology.        Dispo: Follow-up in 2 months  Signed, Ezekial Arns Rebekah Canada, PA-C

## 2023-09-02 DIAGNOSIS — S40861A Insect bite (nonvenomous) of right upper arm, initial encounter: Secondary | ICD-10-CM | POA: Diagnosis not present

## 2023-09-02 DIAGNOSIS — W57XXXA Bitten or stung by nonvenomous insect and other nonvenomous arthropods, initial encounter: Secondary | ICD-10-CM | POA: Diagnosis not present

## 2023-09-02 DIAGNOSIS — N184 Chronic kidney disease, stage 4 (severe): Secondary | ICD-10-CM | POA: Diagnosis not present

## 2023-09-02 DIAGNOSIS — R0789 Other chest pain: Secondary | ICD-10-CM | POA: Diagnosis not present

## 2023-09-02 DIAGNOSIS — E669 Obesity, unspecified: Secondary | ICD-10-CM | POA: Diagnosis not present

## 2023-09-02 DIAGNOSIS — I130 Hypertensive heart and chronic kidney disease with heart failure and stage 1 through stage 4 chronic kidney disease, or unspecified chronic kidney disease: Secondary | ICD-10-CM | POA: Diagnosis not present

## 2023-09-02 DIAGNOSIS — R7989 Other specified abnormal findings of blood chemistry: Secondary | ICD-10-CM | POA: Diagnosis not present

## 2023-09-02 DIAGNOSIS — B029 Zoster without complications: Secondary | ICD-10-CM | POA: Diagnosis not present

## 2023-09-02 DIAGNOSIS — E039 Hypothyroidism, unspecified: Secondary | ICD-10-CM | POA: Diagnosis not present

## 2023-09-03 ENCOUNTER — Ambulatory Visit: Admitting: Cardiology

## 2023-09-04 ENCOUNTER — Telehealth: Payer: Self-pay

## 2023-09-04 DIAGNOSIS — Z79899 Other long term (current) drug therapy: Secondary | ICD-10-CM

## 2023-09-04 MED ORDER — APIXABAN 5 MG PO TABS: 5.0000 mg | ORAL_TABLET | Freq: Two times a day (BID) | ORAL | 2 refills | Status: DC

## 2023-09-04 NOTE — Telephone Encounter (Signed)
 Pt made aware of the below recommendations and verbalized understanding. Orders placed   Furth, Cadence H, PA-C  Chapman Commodore, RN Hgb improved. Per MD, restart Eliquis  5mg BID. CBC in 4 weeks.       Previous Messages    ----- Message ----- From: Devorah Fonder, MD Sent: 09/03/2023   8:48 AM EDT To: Durward Gillis, PA-C  I think that would be fine to start Eliquis  Perhaps we can do CBC in several weeks time Thx TG

## 2023-09-10 ENCOUNTER — Telehealth (HOSPITAL_BASED_OUTPATIENT_CLINIC_OR_DEPARTMENT_OTHER): Payer: Self-pay

## 2023-09-10 NOTE — Telephone Encounter (Signed)
 Did we ever get this submitted for pt?   Copied from CRM [id number]. Topic: Clinical - Order For Equipment >> Sep 09, 2023  4:16 PM Ilene Malling wrote: Reason for CRM: Patient [phone] did a sleep study 07/16/23 and was advised to have oxygen machine, and has not received a call from the company to set up an oxygen machine or bipap. Patient is unsure of what machine. Patient states oxygen level dropping while sleeping. Patient denies shortness of breath now, nor pain at the moment. Patient has been trying get help on this issue for months. Patient would like to speak with someone on this matter to sleep better, please call back.

## 2023-09-10 NOTE — Telephone Encounter (Signed)
 Order has been received. NFN

## 2023-09-11 NOTE — Telephone Encounter (Signed)
 Lynn Joseph; Lynn Joseph; Lynn Joseph, Lynn Joseph; Lynn Joseph, Lynn Joseph Callow Testing to qualify for Oxygen expire after 30 days.

## 2023-09-11 NOTE — Telephone Encounter (Signed)
 Please advise on order as listed below this has been going on for a while for pt

## 2023-09-11 NOTE — Telephone Encounter (Signed)
 That shouldn't matter. Needs CPAP titration study for oxygen

## 2023-09-11 NOTE — Telephone Encounter (Signed)
 Overnight oximetry test on CPAP on 07/05/2023 showed patient spent 43 minutes with oxygen level less than 88%.  She is already been ordered for CPAP titration study, she will need to undergo this for assess oxygen liter flow

## 2023-09-11 NOTE — Telephone Encounter (Signed)
 Can you look at this a titration study was ordered for her

## 2023-09-11 NOTE — Telephone Encounter (Signed)
 Lynn Joseph; Lynn Joseph; Lynn Joseph, Lynn Joseph; Lynn Joseph, Lynn Joseph It looks like her Testing ONO and Sleep Study are both expired. They expire after 30days. Since she has OSA we would need an updated titrated sleep study to qualify this pt.  Thank you

## 2023-09-17 NOTE — Telephone Encounter (Signed)
 Order for BiPAP is in process. Per titration study no supplemental oxygen needed. Closing encounter.

## 2023-09-18 ENCOUNTER — Encounter (HOSPITAL_BASED_OUTPATIENT_CLINIC_OR_DEPARTMENT_OTHER): Admitting: Internal Medicine

## 2023-09-20 DIAGNOSIS — N184 Chronic kidney disease, stage 4 (severe): Secondary | ICD-10-CM | POA: Diagnosis not present

## 2023-09-20 DIAGNOSIS — N390 Urinary tract infection, site not specified: Secondary | ICD-10-CM | POA: Diagnosis not present

## 2023-09-25 DIAGNOSIS — N2581 Secondary hyperparathyroidism of renal origin: Secondary | ICD-10-CM | POA: Diagnosis not present

## 2023-09-25 DIAGNOSIS — D631 Anemia in chronic kidney disease: Secondary | ICD-10-CM | POA: Diagnosis not present

## 2023-09-25 DIAGNOSIS — N1832 Chronic kidney disease, stage 3b: Secondary | ICD-10-CM | POA: Diagnosis not present

## 2023-09-25 DIAGNOSIS — I129 Hypertensive chronic kidney disease with stage 1 through stage 4 chronic kidney disease, or unspecified chronic kidney disease: Secondary | ICD-10-CM | POA: Diagnosis not present

## 2023-09-25 DIAGNOSIS — N189 Chronic kidney disease, unspecified: Secondary | ICD-10-CM | POA: Diagnosis not present

## 2023-10-10 ENCOUNTER — Ambulatory Visit

## 2023-10-14 ENCOUNTER — Telehealth (HOSPITAL_BASED_OUTPATIENT_CLINIC_OR_DEPARTMENT_OTHER): Payer: Self-pay

## 2023-10-14 DIAGNOSIS — G4734 Idiopathic sleep related nonobstructive alveolar hypoventilation: Secondary | ICD-10-CM

## 2023-10-14 DIAGNOSIS — G4733 Obstructive sleep apnea (adult) (pediatric): Secondary | ICD-10-CM

## 2023-10-14 NOTE — Addendum Note (Signed)
 Addended by: TRUDY WARREN CROME on: 10/14/2023 02:43 PM   Modules accepted: Orders

## 2023-10-14 NOTE — Telephone Encounter (Signed)
 Change CPAP 21/ 17 CM H20

## 2023-10-14 NOTE — Telephone Encounter (Signed)
 Copied from CRM [id number]. Topic: Clinical - Medical Advice >> Oct 14, 2023 12:13 PM Corean SAUNDERS wrote: Reason for CRM: Patient states her new BiPAP machines pressure is way to high that's its blowing so hard that its forcing her mouth open. Patients medical supply company advised her to cont act her provider to have this fixed, patient sees Dr. Kassie and is requesting another provider to please her with this matter. Please call patient back and advise.   *Patient states if she does not answer please leave a voicemail*

## 2023-10-15 ENCOUNTER — Encounter (HOSPITAL_BASED_OUTPATIENT_CLINIC_OR_DEPARTMENT_OTHER): Payer: Self-pay | Admitting: Pulmonary Disease

## 2023-10-29 ENCOUNTER — Telehealth (HOSPITAL_COMMUNITY): Payer: Self-pay | Admitting: Pharmacy Technician

## 2023-10-29 NOTE — Telephone Encounter (Signed)
 Auth Submission: NO AUTH NEEDED Site of care: Site of care: [facility] Payer: Medicare A/B, Tricare Medication & CPT/J Code(s) submitted: Feraheme (ferumoxytol ) [id number] Diagnosis Code: D63.1 Route of submission (phone, fax, portal):  Phone # Fax # Auth type: Buy/Bill HB Units/visits requested: 510mg  x 2 doses Reference number:  Approval from: 10/29/23 to 05/23/24    Medicare A/B will cover 80%, Tricare will cover remaining 20%. Med will be covered at 100%.   Legacy Carrender, CPhT [facility] [phone]

## 2023-10-30 ENCOUNTER — Encounter: Payer: Self-pay | Admitting: Cardiology

## 2023-10-30 ENCOUNTER — Ambulatory Visit: Attending: Cardiology | Admitting: Cardiology

## 2023-10-30 VITALS — BP 149/84 | HR 75 | Ht 65.0 in | Wt 196.4 lb

## 2023-10-30 DIAGNOSIS — R0602 Shortness of breath: Secondary | ICD-10-CM | POA: Diagnosis not present

## 2023-10-30 DIAGNOSIS — E782 Mixed hyperlipidemia: Secondary | ICD-10-CM | POA: Diagnosis not present

## 2023-10-30 DIAGNOSIS — I10 Essential (primary) hypertension: Secondary | ICD-10-CM | POA: Diagnosis not present

## 2023-10-30 DIAGNOSIS — R55 Syncope and collapse: Secondary | ICD-10-CM | POA: Insufficient documentation

## 2023-10-30 DIAGNOSIS — R600 Localized edema: Secondary | ICD-10-CM | POA: Insufficient documentation

## 2023-10-30 DIAGNOSIS — N1832 Chronic kidney disease, stage 3b: Secondary | ICD-10-CM | POA: Diagnosis not present

## 2023-10-30 DIAGNOSIS — I480 Paroxysmal atrial fibrillation: Secondary | ICD-10-CM | POA: Diagnosis not present

## 2023-10-30 NOTE — Progress Notes (Signed)
 Cardiology Office Note   Date:  10/30/2023  ID:  Deshannon, Seide 17-Aug-1945, MRN [id number] PCP: Larnell Hamilton, MD  [facility] Providers Cardiologist:  Evalene Lunger, MD Cardiology APP:  Gerard Frederick, NP     History of Present Illness Lynn Joseph is a 78 y.o. female with a past medical history of hypertension, paroxysmal atrial fibrillation, CKD, hypothyroidism, palpitations, anemia, GI bleed, who presents today for follow-up.   First evaluated by Dr. Gollan on 12/22/2021. She reported a history of CHF. Past echo in June 2017 showed normal LV function, EF 60-65% with mild MR. Repeat echo in July 2020 showed EF 55-60% with trivial MR. At that time of her visit she complained of chest pain and palpitations. She underwent nuclear stress testing which was a low risk study as detailed above. 14 day Zio as detailed above.  In November 2023, she underwent hospital admission for new onset afib. She had recently been seen in the ED in [place]  for cholecystitis and had a perc drain placed. She returned to [facility] and was scheduled to see a surgeon when she felt fluttering in her chest and her Apple watch alerted for afib. Echo showed normal LV/RV function as detailed above. She was started on Eliquis  and lopressor . She was seen in follow up on January 2024 and noted to have elevated BP at the time of her visit and with logged home readings. Medications were adjusted and she underwent renal US  which showed renal artery stenosis. She was referred to Dr. Darron who performed a renal angiography and determined revascularization was not required.   She had 2 hospitalizations in December 2024 for anemia requiring blood transfusion.  She had been sent 12/712/8 with progressive shortness of breath x 2 weeks.  Initial labs revealed hemoglobin of 6.1.  Hemoccult was negative, troponin was negative, chest x-ray showed left base consolidation and effusion with mild vascular congestion.  She received IV  antibiotics for pneumonia and was transfused 1 unit of PRBCs.  She did remain in atrial fibrillation that was rate controlled and her apixaban  was held for 1 day.  Subsequent mission 12/17-12/22-hemoglobin of 7 but Hemoccult was positive.  She was maintaining sinus rhythm.  She was transfused 2 units of PRBCs.  Multiple myeloma was ruled out by nephrology.  The patient opted to have EGD/colonoscopy as an outpatient.  She was discharged on 12/22 plan follow-up with nephrology, oncology, GI.  She was instructed to hold her apixaban  until told to resume by GI or PCP.  She was also told to stop her Bystolic  and hydralazine .    She was seen in clinic 05/20/2023 to discuss her echo results as well as concerns of fluctuating blood pressure with Dr. Gollan. Stress testing was reviewed and showed no significant ischemia.  She denies any chest pain concerning for angina.  There was no further testing at that time.  Eliquis  remains on hold in the setting of anemia.  Cardura  was increased to 1 mg twice daily and she was continued on Nebivolol  20 mg twice daily.  No further testing was ordered at that time.  She was seen in clinic 07/24/23 by Dr Darliss for feeling dizzy with near syncope.  Patient states she not take her blood pressure meds or furosemide  since Saturday.  States that she started GLP-1 resulting in weight loss of 10 pounds.  Blood pressure check at that time showed systolic in the low 100s.  She has stopped taking all her blood pressure pressure medications  within the past 48 hours.  Home blood pressures were ranging 130s and 140s.  Near syncope and dizziness had resolved with stopping all of her medications when she was evaluated in clinic she was hypertensive.  She was recommended to Nebivolol  20 mg twice daily, hydralazine  25 mg twice daily, with holding Cardura  now due to orthostatic effect.  If blood pressure stays elevated titrate hydralazine  further.  Patient had allergies to carvedilol  and Norvasc  and was not on HCTZ due to renal dysfunction.  She was last seen in clinic 08/28/2023 where she had chest pain the night before I took her blood pressure and it was high.  She also took aspirin  325 mg and hydralazine  and went to bed.  She took her meds this a.m. took a fluid pill.  Stated that her head hurts she still had chest pressure.  She was taking Lasix  10 mg daily.  She was taken Nebivolol  20 mg in the a.m. in the p.m. and hydralazine  25 twice daily.  She was staying well-hydrated.  She was scheduled for updated labs and is scheduled for cardiac PET stress.  Unfortunately testing was not completed.  She returns to clinic today stating overall from the cardiac perspective she has been doing well.  She had previously been scheduled for stress testing due previously having chest pain and chest pressure but canceled her testing felt as though it was not needed.  She stated that her nephrologist had taking her all of of hydralazine  and reduced her furosemide  dosing and advised her to take it every day.  She denies any further episodes of syncope or near syncope.  Chest pain has resolved.  And atrial fibrillation has been under control.  She had previously stopped her apixaban  due to anemia and then was subsequently restarted for CHA2DS2-VASc score of at least 4.  She states that she is compliant with the rest of her medication regimen without any undue side effects.  She denies any bleeding with no blood noted in her stool or urine.  She denies any recent hospitalizations or visits to the emergency department.  ROS: 10 point review of system has been reviewed and considered negative the exception was been listed in the HPI  Studies Reviewed     2D echo 05/10/2023 1. Left ventricular ejection fraction, by estimation, is 55 to 60%. The  left ventricle has normal function. The left ventricle has no regional  wall motion abnormalities. Left ventricular diastolic parameters were  normal.   2. Right  ventricular systolic function is normal. The right ventricular  size is mildly enlarged.   3. Left atrial size was severely dilated.   4. A small pericardial effusion is present. Moderate pleural effusion in  the left lateral region.   5. The mitral valve is normal in structure. Mild to moderate mitral valve  regurgitation.   6. Tricuspid valve regurgitation is mild to moderate.   7. The aortic valve is tricuspid. Aortic valve regurgitation is not  visualized.   8. The inferior vena cava is normal in size with greater than 50%  respiratory variability, suggesting right atrial pressure of 3 mmHg.  Renal Angiography 1.  Normal left renal artery with mild nonobstructive disease affecting the right renal artery.   Recommendations: No significant renal artery stenosis that requires revascularization.  Continue medical therapy. 40 mL of contrast was used.   2D echo 03/09/2022 1. Left ventricular ejection fraction, by estimation, is 55 to 60%. The  left ventricle has normal function. The  left ventricle has no regional  wall motion abnormalities. Left ventricular diastolic function could not  be evaluated.   2. Right ventricular systolic function is normal. The right ventricular  size is normal. There is normal pulmonary artery systolic pressure. The  estimated right ventricular systolic pressure is 15.8 mmHg.   3. The mitral valve is grossly normal. Trivial mitral valve  regurgitation. No evidence of mitral stenosis.   4. The aortic valve is tricuspid. There is mild calcification of the  aortic valve. Aortic valve regurgitation is not visualized. Aortic valve  sclerosis is present, with no evidence of aortic valve stenosis.   5. The inferior vena cava is normal in size with greater than 50%  respiratory variability, suggesting right atrial pressure of 3 mmHg.    Lexiscan  Myoview  01/11/2022 Pharmacological myocardial perfusion imaging study with no significant  ischemia Normal wall  motion, EF estimated at 59% No EKG changes concerning for ischemia at peak stress or in recovery. CT attenuation correction images with no significant coronary calcification, minimal aortic atherosclerosis Low risk scan Risk Assessment/Calculations  CHA2DS2-VASc Score = 4   This indicates a 4.8% annual risk of stroke. The patient's score is based upon: CHF History: 0 HTN History: 1 Diabetes History: 0 Stroke History: 0 Vascular Disease History: 0 Age Score: 2 Gender Score: 1        Physical Exam VS:  BP (!) 149/84   Pulse 75   Ht 5' 5 (1.651 m)   Wt 196 lb 6.4 oz (89.1 kg)   SpO2 97%   BMI 32.68 kg/m        Wt Readings from Last 3 Encounters:  10/30/23 196 lb 6.4 oz (89.1 kg)  08/28/23 205 lb 9.6 oz (93.3 kg)  08/19/23 206 lb 14.2 oz (93.8 kg)    GEN: Well nourished, well developed in no acute distress NECK: No JVD; No carotid bruits CARDIAC: RRR, no murmurs, rubs, gallops RESPIRATORY:  Clear to auscultation without rales, wheezing or rhonchi  ABDOMEN: Soft, non-tender, non-distended EXTREMITIES:  1+ edema; No deformity   ASSESSMENT AND PLAN Essential hypertension with a blood pressure today 149/84.  Patient states her blood pressure typically runs 130s at home.  She has been continued on furosemide  10 mg daily and Nebivolol  20 mg twice daily.  Previously she was on hydralazine  which was recently discontinued and she has an allergy to carvedilol  and to amlodipine.  She has been encouraged to continue to monitor pressures 1 to 2 hours postmedication administration at home as well. Previous changes in medications have been limited to due to orthostatic hypotension and near syncopal episodes.  Paroxysmal atrial fibrillation which she is maintaining sinus rhythm today.  She is continued on apixaban  5 mg twice daily for CHA2DS2-VASc score of at least 4 for stroke prophylaxis.  She is also continued on Nebivolol  20 mg twice daily.  She denies any bleeding or blood noted in her  stool or urine.  She does not currently meet reduced dosing criteria.  CKD with last serum creatinine of 1.77.  This actually showed a slight improvement.  She has been encouraged to continue to stay well-hydrated and avoid NSAIDs.  Continue with follow-up with nephrology.  Chronic shortness of breath peripheral edema that been stable and unchanged.  Last echocardiogram was completed in January 2025 which revealed LVEF 55 to 60%, no RWMA, right ventricular systolic size and function were normal with trivial mitral valve regurgitation, aortic valve sclerosis is present with no evidence of valvular stenosis.  Near syncope no further episodes with the discontinuation of prior Cardura .  Blood pressures remain stable at home running 130s systolic.  States that it was related to being dehydrated and losing 10 pounds taking Wegovy.  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  Recommended previously that she would restart rosuvastatin  5 mg daily.  Will need updated lipid and hepatic panel on return.       Dispo: Patient to return to clinic to see MD/APP in 6 months or sooner if needed for further evaluation  Signed, Rashell Shambaugh, NP

## 2023-10-30 NOTE — Patient Instructions (Signed)
 Medication Instructions:  Your physician recommends that you continue on your current medications as directed. Please refer to the Current Medication list given to you today.   *If you need a refill on your cardiac medications before your next appointment, please call your pharmacy*  Lab Work: No labs ordered today  If you have labs (blood work) drawn today and your tests are completely normal, you will receive your results only by: MyChart Message (if you have MyChart) OR A paper copy in the mail If you have any lab test that is abnormal or we need to change your treatment, we will call you to review the results.  Testing/Procedures: No test ordered today   Follow-Up: At [facility], you and your health needs are our priority.  As part of our continuing mission to provide you with exceptional heart care, our providers are all part of one team.  This team includes your primary Cardiologist (physician) and Advanced Practice Providers or APPs (Physician Assistants and Nurse Practitioners) who all work together to provide you with the care you need, when you need it.  Your next appointment:   6 month(s)  Provider:   Timothy Gollan, MD or Tylene Lunch, NP

## 2023-10-31 ENCOUNTER — Ambulatory Visit

## 2023-11-01 DIAGNOSIS — E785 Hyperlipidemia, unspecified: Secondary | ICD-10-CM | POA: Diagnosis not present

## 2023-11-01 DIAGNOSIS — N184 Chronic kidney disease, stage 4 (severe): Secondary | ICD-10-CM | POA: Diagnosis not present

## 2023-11-01 DIAGNOSIS — G4733 Obstructive sleep apnea (adult) (pediatric): Secondary | ICD-10-CM | POA: Diagnosis not present

## 2023-11-01 DIAGNOSIS — I5030 Unspecified diastolic (congestive) heart failure: Secondary | ICD-10-CM | POA: Diagnosis not present

## 2023-11-01 DIAGNOSIS — I480 Paroxysmal atrial fibrillation: Secondary | ICD-10-CM | POA: Diagnosis not present

## 2023-11-01 DIAGNOSIS — I130 Hypertensive heart and chronic kidney disease with heart failure and stage 1 through stage 4 chronic kidney disease, or unspecified chronic kidney disease: Secondary | ICD-10-CM | POA: Diagnosis not present

## 2023-11-04 ENCOUNTER — Other Ambulatory Visit: Payer: Self-pay | Admitting: Cardiovascular Disease

## 2023-11-06 ENCOUNTER — Telehealth (HOSPITAL_BASED_OUTPATIENT_CLINIC_OR_DEPARTMENT_OTHER): Payer: Self-pay

## 2023-11-06 ENCOUNTER — Other Ambulatory Visit (HOSPITAL_COMMUNITY): Payer: Self-pay | Admitting: *Deleted

## 2023-11-06 DIAGNOSIS — G4733 Obstructive sleep apnea (adult) (pediatric): Secondary | ICD-10-CM

## 2023-11-06 DIAGNOSIS — D2262 Melanocytic nevi of left upper limb, including shoulder: Secondary | ICD-10-CM | POA: Diagnosis not present

## 2023-11-06 DIAGNOSIS — D2261 Melanocytic nevi of right upper limb, including shoulder: Secondary | ICD-10-CM | POA: Diagnosis not present

## 2023-11-06 DIAGNOSIS — D2272 Melanocytic nevi of left lower limb, including hip: Secondary | ICD-10-CM | POA: Diagnosis not present

## 2023-11-06 DIAGNOSIS — D044 Carcinoma in situ of skin of scalp and neck: Secondary | ICD-10-CM | POA: Diagnosis not present

## 2023-11-06 DIAGNOSIS — D225 Melanocytic nevi of trunk: Secondary | ICD-10-CM | POA: Diagnosis not present

## 2023-11-06 DIAGNOSIS — L821 Other seborrheic keratosis: Secondary | ICD-10-CM | POA: Diagnosis not present

## 2023-11-06 DIAGNOSIS — L570 Actinic keratosis: Secondary | ICD-10-CM | POA: Diagnosis not present

## 2023-11-06 DIAGNOSIS — C44619 Basal cell carcinoma of skin of left upper limb, including shoulder: Secondary | ICD-10-CM | POA: Diagnosis not present

## 2023-11-06 DIAGNOSIS — D2271 Melanocytic nevi of right lower limb, including hip: Secondary | ICD-10-CM | POA: Diagnosis not present

## 2023-11-06 DIAGNOSIS — C44519 Basal cell carcinoma of skin of other part of trunk: Secondary | ICD-10-CM | POA: Diagnosis not present

## 2023-11-06 DIAGNOSIS — D485 Neoplasm of uncertain behavior of skin: Secondary | ICD-10-CM | POA: Diagnosis not present

## 2023-11-06 NOTE — Telephone Encounter (Signed)
 Pt states she is having a really hard time with the air blasting she looks like a chipmunk and her mouth is wide open she says when she goes to sleep the mask is fitting okay and she is doing well then after a while something happens and its ramped up and air is blasting, its like the mask is dislodged from my face its blowing from around the hose insertion point and the mask she states the machine is telling her its leaking the next morning but when she starts at night with it on its doing fine. I asked if she thought maybe she was hitting the mask in her sleep and knocking it loose she is adamant that she sleeps on her back and dosent move so she knows this is not what is happening. Please advise  Copied from CRM [id number]. Topic: Clinical - Medical Advice >> Nov 06, 2023  1:55 PM Corean SAUNDERS wrote: Reason for CRM:  Patient is having trouble with her Bipap as the pressure is blowing to hard to where she can't sleep. Patient would like to discuss this with Dr. Nicoletta nurse to make sure everything is okay.

## 2023-11-06 NOTE — Telephone Encounter (Signed)
 Current pressure 21/17 with residual AHI 1.0   We can lower BIPAP pressure IPAP 20/EPAP16

## 2023-11-07 ENCOUNTER — Encounter (HOSPITAL_COMMUNITY)
Admission: RE | Admit: 2023-11-07 | Discharge: 2023-11-07 | Disposition: A | Discharge status: Home / Self Care | Source: Ambulatory Visit | Attending: Nephrology | Admitting: Nephrology

## 2023-11-07 DIAGNOSIS — D509 Iron deficiency anemia, unspecified: Secondary | ICD-10-CM | POA: Insufficient documentation

## 2023-11-07 DIAGNOSIS — N184 Chronic kidney disease, stage 4 (severe): Secondary | ICD-10-CM | POA: Insufficient documentation

## 2023-11-07 DIAGNOSIS — D631 Anemia in chronic kidney disease: Secondary | ICD-10-CM | POA: Diagnosis not present

## 2023-11-07 MED ORDER — SODIUM CHLORIDE 0.9 % IV SOLN
510.0000 mg | INTRAVENOUS | Status: DC
  Administered 2023-11-07: 510 mg via INTRAVENOUS
  Filled 2023-11-07: qty 510

## 2023-11-14 ENCOUNTER — Encounter (HOSPITAL_COMMUNITY)
Admission: RE | Admit: 2023-11-14 | Discharge: 2023-11-14 | Disposition: A | Discharge status: Home / Self Care | Source: Ambulatory Visit | Attending: Nephrology

## 2023-11-14 DIAGNOSIS — N184 Chronic kidney disease, stage 4 (severe): Secondary | ICD-10-CM | POA: Diagnosis not present

## 2023-11-14 DIAGNOSIS — D509 Iron deficiency anemia, unspecified: Secondary | ICD-10-CM | POA: Diagnosis not present

## 2023-11-14 DIAGNOSIS — D631 Anemia in chronic kidney disease: Secondary | ICD-10-CM | POA: Diagnosis not present

## 2023-11-14 MED ORDER — SODIUM CHLORIDE 0.9 % IV SOLN
510.0000 mg | INTRAVENOUS | Status: DC
  Administered 2023-11-14: 510 mg via INTRAVENOUS
  Filled 2023-11-14: qty 510

## 2023-11-19 ENCOUNTER — Other Ambulatory Visit: Payer: Self-pay

## 2023-11-19 DIAGNOSIS — C44619 Basal cell carcinoma of skin of left upper limb, including shoulder: Secondary | ICD-10-CM | POA: Diagnosis not present

## 2023-11-19 DIAGNOSIS — C44519 Basal cell carcinoma of skin of other part of trunk: Secondary | ICD-10-CM | POA: Diagnosis not present

## 2023-11-19 MED ORDER — APIXABAN 5 MG PO TABS: 5.0000 mg | ORAL_TABLET | Freq: Two times a day (BID) | ORAL | 2 refills | Status: AC

## 2023-11-19 NOTE — Telephone Encounter (Signed)
 Prescription refill request for Eliquis  received. Indication:afib Last office visit:7/25 Scr:1.77  5/25 Age: 78 Weight:89.1  kg  Prescription refilled

## 2023-12-06 ENCOUNTER — Ambulatory Visit: Attending: Medical | Admitting: Medical

## 2023-12-06 ENCOUNTER — Encounter: Payer: Self-pay | Admitting: Medical

## 2023-12-06 ENCOUNTER — Encounter: Payer: Self-pay | Admitting: Cardiovascular Disease

## 2023-12-06 VITALS — BP 134/72 | HR 76 | Ht 65.0 in | Wt 190.2 lb

## 2023-12-06 DIAGNOSIS — E782 Mixed hyperlipidemia: Secondary | ICD-10-CM | POA: Diagnosis not present

## 2023-12-06 DIAGNOSIS — I480 Paroxysmal atrial fibrillation: Secondary | ICD-10-CM | POA: Diagnosis not present

## 2023-12-06 DIAGNOSIS — R072 Precordial pain: Secondary | ICD-10-CM | POA: Insufficient documentation

## 2023-12-06 DIAGNOSIS — N1832 Chronic kidney disease, stage 3b: Secondary | ICD-10-CM | POA: Insufficient documentation

## 2023-12-06 DIAGNOSIS — I10 Essential (primary) hypertension: Secondary | ICD-10-CM | POA: Insufficient documentation

## 2023-12-06 DIAGNOSIS — Z79899 Other long term (current) drug therapy: Secondary | ICD-10-CM | POA: Insufficient documentation

## 2023-12-06 NOTE — Progress Notes (Unsigned)
 Cardiology Office Note   Date:  12/06/2023  ID:  Lynn Joseph, Lynn Joseph 13-Mar-1946, MRN [id number] PCP: Larnell Hamilton, MD  [facility] Providers Cardiologist:  Evalene Lunger, MD Cardiology APP:  Gerard Frederick, NP   History of Present Illness Lynn Joseph is a 78 y.o. female with a history of hypertension, A-fib, CKD, hypothyroidism who presents for follow-up of chest pain.   Echo in November 2023 showed EF 55 to 60%.   Patient saw Dr. Darliss 08/19/2023 with pre-syncope/syncope that occurred 2 days prior.  Pre-syncope was in the setting of taking BP meds, losing 10 lbs, and dehydration.  initially her medications were held, but BP improved and she was restarted on lasix  10mg  daily, nebivolol  20mg  daily and hydralazine  50mg  daily. At follow-up orthostatics were almost positive. She also reported chest pain and Cardiac PET stress was ordered, but not completed.   The patient was last seen 10/30/23 reporting she felt stress testing was not needed. BP was mildly elevated.   Today, the patient reports chest pain that woke her up overnight. She is wearing a Bipap machine now, she was switched from a CPAP. She checked her heart rate and it was normal. BP Was a little high and she took her AM medications. Chest pain was in the center of the chest and higher up into the neck. It was a pressure and tightness. This is different than GERD. She has no exertional symptoms.   Studies Reviewed EKG Interpretation Date/Time:  Friday December 06 2023 14:50:40 EDT Ventricular Rate:  76 PR Interval:  142 QRS Duration:  80 QT Interval:  396 QTC Calculation: 445 R Axis:   13  Text Interpretation: Normal sinus rhythm Minimal voltage criteria for LVH, may be normal variant ( R in aVL ) When compared with ECG of 28-Aug-2023 11:18, No significant change was found Confirmed by Franchester, Alexcis Bicking ([id number]) on 12/06/2023 3:02:30 PM    2D echo 05/10/2023 1. Left ventricular ejection fraction, by  estimation, is 55 to 60%. The  left ventricle has normal function. The left ventricle has no regional  wall motion abnormalities. Left ventricular diastolic parameters were  normal.   2. Right ventricular systolic function is normal. The right ventricular  size is mildly enlarged.   3. Left atrial size was severely dilated.   4. A small pericardial effusion is present. Moderate pleural effusion in  the left lateral region.   5. The mitral valve is normal in structure. Mild to moderate mitral valve  regurgitation.   6. Tricuspid valve regurgitation is mild to moderate.   7. The aortic valve is tricuspid. Aortic valve regurgitation is not  visualized.   8. The inferior vena cava is normal in size with greater than 50%  respiratory variability, suggesting right atrial pressure of 3 mmHg.  Renal Angiography 1.  Normal left renal artery with mild nonobstructive disease affecting the right renal artery.   Recommendations: No significant renal artery stenosis that requires revascularization.  Continue medical therapy. 40 mL of contrast was used.   2D echo 03/09/2022 1. Left ventricular ejection fraction, by estimation, is 55 to 60%. The  left ventricle has normal function. The left ventricle has no regional  wall motion abnormalities. Left ventricular diastolic function could not  be evaluated.   2. Right ventricular systolic function is normal. The right ventricular  size is normal. There is normal pulmonary artery systolic pressure. The  estimated right ventricular systolic pressure is 15.8 mmHg.   3. The mitral valve  is grossly normal. Trivial mitral valve  regurgitation. No evidence of mitral stenosis.   4. The aortic valve is tricuspid. There is mild calcification of the  aortic valve. Aortic valve regurgitation is not visualized. Aortic valve  sclerosis is present, with no evidence of aortic valve stenosis.   5. The inferior vena cava is normal in size with greater than 50%   respiratory variability, suggesting right atrial pressure of 3 mmHg.    Lexiscan Myoview 01/11/2022 Pharmacological myocardial perfusion imaging study with no significant  ischemia Normal wall motion, EF estimated at 59% No EKG changes concerning for ischemia at peak stress or in recovery. CT attenuation correction images with no significant coronary calcification, minimal aortic atherosclerosis Low risk scan   Physical Exam VS:  BP 134/72   Pulse 76   Ht 5' 5 (1.651 m)   Wt 190 lb 3.2 oz (86.3 kg)   SpO2 97%   BMI 31.65 kg/m        Wt Readings from Last 3 Encounters:  12/06/23 190 lb 3.2 oz (86.3 kg)  10/30/23 196 lb 6.4 oz (89.1 kg)  08/28/23 205 lb 9.6 oz (93.3 kg)    GEN: Well nourished, well developed in no acute distress NECK: No JVD; No carotid bruits CARDIAC: RRR, no murmurs, rubs, gallops RESPIRATORY:  Clear to auscultation without rales, wheezing or rhonchi  ABDOMEN: Soft, non-tender, non-distended EXTREMITIES:  No edema; No deformity   ASSESSMENT AND PLAN  Chest pain She reports chest pain last night that woke her up twice. It was centralized chest tightness that went up into the neck. BP was high and she took her medication with improvement of pain. She has no exertional symptoms. Says its similar to prior chronic chest pains she has had in the past. She does have OSA and recently switched to a Bipap. She feels chest pain is different than GERD. Chest CT in 2023 showed no significant coronary calcifications. MPI in 2023 was normal and low risk. Cardiac PET stress ordered a few months ago for chest pain, but patient canceled. I will re-order Cardiac PET stress.   HTN BP today is good at 134/72. Continue nebivolol 20mg  BID.   Paroxysmal Afib The patient is in NSR today. Continue Eliquis 5mg  BID for stroke ppx. Continue Nebivolol 20mg  BID for rate control  CKD stage 3 Scr baseline around 1.7-2. she follows with nephrology.   Mixed HLD Re-check lipids/LFT  since restarting Crestor. Prior LDL 92. Continue Crestor 5mg  daily.   Hypomagnesemia Mag 1.6. she has been prescribed Magnesium, but has not been taking it. I encouraged compliance with magnesium.      Informed Consent   Shared Decision Making/Informed Consent The risks [chest pain, shortness of breath, cardiac arrhythmias, dizziness, blood pressure fluctuations, myocardial infarction, stroke/transient ischemic attack, nausea, vomiting, allergic reaction, radiation exposure, metallic taste sensation and life-threatening complications (estimated to be 1 in 10,000)], benefits (risk stratification, diagnosing coronary artery disease, treatment guidance) and alternatives of a cardiac PET stress test were discussed in detail with Ms. Prescher and she agrees to proceed.     Dispo: Follow-up in 3 months with MD  Signed, Saintclair Schroader VEAR Fishman, PA-C

## 2023-12-06 NOTE — Patient Instructions (Addendum)
 Medication Instructions:  Your physician recommends that you continue on your current medications as directed. Please refer to the Current Medication list given to you today.   *If you need a refill on your cardiac medications before your next appointment, please call your pharmacy*  Lab Work: Your provider would like for you to have following labs drawn today Direct LDL, Lipid panel, Liver function test, and magnesium  level.   If you have labs (blood work) drawn today and your tests are completely normal, you will receive your results only by: MyChart Message (if you have MyChart) OR A paper copy in the mail If you have any lab test that is abnormal or we need to change your treatment, we will call you to review the results.  Testing/Procedures:  Please report to Radiology at [facility] Main Entrance, medical mall, 30 mins prior to your test.  [address]  [place], [place]  How to Prepare for Your Cardiac PET/CT Stress Test:  Nothing to eat or drink, except water, 3 hours prior to arrival time.  NO caffeine/decaffeinated products, or chocolate 12 hours prior to arrival. (Please note decaffeinated beverages (teas/coffees) still contain caffeine).  If you have caffeine within 12 hours prior, the test will need to be rescheduled.  Medication instructions: Do not take nitrates (isosorbide mononitrate, Ranexa) the day before or day of test Do not take tamsulosin the day before or morning of test Hold theophylline containing medications for 12 hours. Hold Dipyridamole 48 hours prior to the test.  NO perfume, cologne or lotion on chest or abdomen area. FEMALES - Please avoid wearing dresses to this appointment.  Total time is 1 to 2 hours; you may want to bring reading material for the waiting time.  IF YOU THINK YOU MAY BE PREGNANT, OR ARE NURSING PLEASE INFORM THE TECHNOLOGIST.  In preparation for your appointment, medication and supplies will be  purchased.  Appointment availability is limited, so if you need to cancel or reschedule, please call the Radiology Department Scheduler at [phone] 24 hours in advance to avoid a cancellation fee of $100.00  What to Expect When you Arrive:  Once you arrive and check in for your appointment, you will be taken to a preparation room within the Radiology Department.  A technologist or Nurse will obtain your medical history, verify that you are correctly prepped for the exam, and explain the procedure.  Afterwards, an IV will be started in your arm and electrodes will be placed on your skin for EKG monitoring during the stress portion of the exam. Then you will be escorted to the PET/CT scanner.  There, staff will get you positioned on the scanner and obtain a blood pressure and EKG.  During the exam, you will continue to be connected to the EKG and blood pressure machines.  A small, safe amount of a radioactive tracer will be injected in your IV to obtain a series of pictures of your heart along with an injection of a stress agent.    After your Exam:  It is recommended that you eat a meal and drink a caffeinated beverage to counter act any effects of the stress agent.  Drink plenty of fluids for the remainder of the day and urinate frequently for the first couple of hours after the exam.  Your doctor will inform you of your test results within 7-10 business days.  For more information and frequently asked questions, please visit our website: [email or web address]  For questions about your  test or how to prepare for your test, please call: Cardiac Imaging Nurse Navigators Office: [phone]  Follow-Up: At [facility], you and your health needs are our priority.  As part of our continuing mission to provide you with exceptional heart care, our providers are all part of one team.  This team includes your primary Cardiologist (physician) and Advanced Practice Providers or APPs  (Physician Assistants and Nurse Practitioners) who all work together to provide you with the care you need, when you need it.  Your next appointment:   3 month(s)  Provider:   You may see Timothy Gollan, MD

## 2023-12-07 LAB — LIPID PANEL
Chol/HDL Ratio: 2.1 ratio (ref 0.0–4.4)
Cholesterol, Total: 108 mg/dL (ref 100–199)
HDL: 52 mg/dL (ref >39)
LDL Chol Calc (NIH): 35 mg/dL (ref 0–99)
Triglycerides: 114 mg/dL (ref 0–149)
VLDL Cholesterol Cal: 21 mg/dL (ref 5–40)

## 2023-12-07 LAB — HEPATIC FUNCTION PANEL
ALT: 40 IU/L — ABNORMAL HIGH (ref 0–32)
AST: 35 IU/L (ref 0–40)
Albumin: 4.5 g/dL (ref 3.8–4.8)
Alkaline Phosphatase: 125 IU/L — ABNORMAL HIGH (ref 44–121)
Bilirubin Total: 0.7 mg/dL (ref 0.0–1.2)
Bilirubin, Direct: 0.27 mg/dL (ref 0.00–0.40)
Total Protein: 6.5 g/dL (ref 6.0–8.5)

## 2023-12-07 LAB — LDL CHOLESTEROL, DIRECT: LDL Direct: 36 mg/dL (ref 0–99)

## 2023-12-07 LAB — MAGNESIUM: Magnesium: 1.1 mg/dL — ABNORMAL LOW (ref 1.6–2.3)

## 2023-12-09 ENCOUNTER — Encounter (HOSPITAL_BASED_OUTPATIENT_CLINIC_OR_DEPARTMENT_OTHER): Payer: Self-pay | Admitting: Pulmonary Disease

## 2023-12-09 ENCOUNTER — Ambulatory Visit (HOSPITAL_BASED_OUTPATIENT_CLINIC_OR_DEPARTMENT_OTHER): Admitting: Pulmonary Disease

## 2023-12-09 ENCOUNTER — Ambulatory Visit: Payer: Self-pay | Admitting: Medical

## 2023-12-09 VITALS — BP 135/81 | HR 73 | Ht 65.0 in | Wt 189.0 lb

## 2023-12-09 DIAGNOSIS — G4733 Obstructive sleep apnea (adult) (pediatric): Secondary | ICD-10-CM | POA: Diagnosis not present

## 2023-12-09 DIAGNOSIS — J90 Pleural effusion, not elsewhere classified: Secondary | ICD-10-CM | POA: Diagnosis not present

## 2023-12-09 NOTE — Patient Instructions (Signed)
--  Reviewed compliance report with AHI 2.3 on BiPAP 20/16. However remains uncomfortable  >Reduce BiPAP IPAP 18 EPAP14  >Will also request new face mask. Size small

## 2023-12-09 NOTE — Addendum Note (Signed)
 Addended by: ALINDA WINN KIDD on: 12/09/2023 04:40 PM   Modules accepted: Orders

## 2023-12-09 NOTE — Progress Notes (Signed)
 Subjective:   [name]: Lynn Joseph GENDER: female DOB: Mar 06, 1946, MRN: [id number]  Chief Complaint  Patient presents with   Follow-up    Reason for Visit: Follow-up  [name] is a 78 year old female with atrial fibrillation, hx pleural effusion that self resolved, HTN, hypothyroidism, s/p roux-en-y gastric bypass who presents for OSA follow-up.  2023 - Initially seen by Lynn Joseph while inpatient for Lynn Joseph and found with right pleural effusion.  2024 - Self resolution of right pleural effusion. Dx with OSA and CPAP ordered in Feb  05/20/23 Since our last visit she reports her breathing is overall doing well. Recently had echocardiogram demonstrating moderate left pleural effusion. She declined thoracentesis and opted for lasix  trial. Now able to lay down flat. Denies shortness of breath, cough after taking additional lasix . She has had mask fitting and is compliant with CPAP for 7 hours nightly with less leakage and feels it improves her quality of sleep.   12/09/23 Since our last visit she was started on BiPAP however pressure is high. She is compliant. Does not feel it is helping quality of sleep. The pressure has been adjusted once but still feels high pressure. Still having leakage from mask.  Social History: Denies any smoking history however lived with smoker and had wood burning stove used for heat and cooking in home for >30 years    Past Medical History:  Diagnosis Date   Anemia of chronic disease    Chronic diarrhea    Glaucoma    HTN (hypertension)    Hypertension    Hypothyroid    Obesity    Skin cancer      Family History  Problem Relation Age of Onset   Hypertension Mother    Thyroid  disease Mother    Stroke Mother    Kidney disease Father    Diabetes Father      Social History   Occupational History   Not on file  Tobacco Use   Smoking status: Never   Smokeless tobacco: Never  Vaping Use   Vaping status: Never Used   Substance and Sexual Activity   Alcohol  use: Not Currently    Comment: 1 glass of wine weekly   Drug use: No   Sexual activity: Not on file    Allergies  Allergen Reactions   Amlodipine Swelling and Other (See Comments)    Lowered B/P too much, made lethargic, also     Carvedilol  Other (See Comments)    Dropped blood pressure too low   Penicillin G Sodium Other (See Comments)    GI upset     Outpatient Medications Prior to Visit  Medication Sig Dispense Refill   apixaban  (ELIQUIS ) 5 MG TABS tablet Take 1 tablet (5 mg total) by mouth 2 (two) times daily. 180 tablet 2   Calcium -Magnesium -Zinc (CALCIUM -MAGNESUIUM-ZINC PO) Take 1 tablet by mouth 2 (two) times a week.     colchicine 0.6 MG tablet Take 0.3 mg by mouth daily as needed (AS DIRECTED for gout flares).     febuxostat  (ULORIC ) 40 MG tablet Take 40 mg by mouth daily.     ferrous sulfate  325 (65 FE) MG tablet Take 1 tablet (325 mg total) by mouth 2 (two) times daily with a meal. 60 tablet 0   furosemide  (LASIX ) 20 MG tablet Take 10 mg by mouth daily as needed (for a weight gain of 4-5 pounds in the period of a couple of days). (Patient taking differently: Take 10  mg by mouth daily.)     gabapentin  (NEURONTIN ) 100 MG capsule Take 100 mg by mouth at bedtime.     Levothyroxine  Sodium (TIROSINT ) 100 MCG CAPS Take 1 capsule (100 mcg total) by mouth daily before breakfast. 30 capsule 0   Nebivolol  HCl 20 MG TABS TAKE 1 TABLET BY MOUTH TWICE DAILY 180 tablet 3   rosuvastatin  (CRESTOR ) 5 MG tablet TAKE 1 TABLET(5 MG) BY MOUTH DAILY 90 tablet 3   TYLENOL  500 MG tablet Take 1,000 mg by mouth every 6 (six) hours as needed for mild pain (pain score 1-3), headache or fever.     WEGOVY 0.25 MG/0.5ML SOAJ Inject 0.25 mg into the skin once a week. (Patient taking differently: Inject 1 mg into the skin once a week.)     No facility-administered medications prior to visit.    Review of Systems  Constitutional:  Negative for chills,  diaphoresis, fever, malaise/fatigue and weight loss.  HENT:  Negative for congestion.   Respiratory:  Negative for cough, hemoptysis, sputum production, shortness of breath and wheezing.   Cardiovascular:  Negative for chest pain, palpitations and leg swelling.     Objective:   Vitals:   12/09/23 1435  BP: 135/81  Pulse: 73  SpO2: 99%  Weight: 189 lb (85.7 kg)  Height: 5' 5 (1.651 m)    SpO2: 99 %  Physical Exam: General: Well-appearing, no acute distress HENT: [place], AT Eyes: EOMI, no scleral icterus Respiratory: Clear to auscultation bilaterally.  No crackles, wheezing or rales Cardiovascular: RRR, -M/R/G, no JVD Extremities:-Edema,-tenderness Neuro: AAO x4, CNII-XII grossly intact Psych: Normal mood, normal affect  Data Reviewed:  Imaging: CXR 03/19/22 - Increased right pleural effusion CXR 05/14/22 - Resolved right pleural effusion CXR 05/06/23 - Left lung base c/w with small pleural effusion with atelectasis CXR 05/20/23 - Slightly improved with left pleural effusion  PFT: 05/14/22 FVC 2.21 (78%) FEV1 1.80 (85%) Ratio 82  TLC 96% DLCO 87% Interpretation: Normal PFTs  Sleep: HST 05/23/22 - Moderate OSA AHI 19.5 Nadir SpO2 82%  Labs:  Thoracentesis 03/16/2022 Red turbid fluid with cell count 1990 N 17% L 72% M percent Protein ratio 3.5/6.4 LDH ratio 141/251 Culture - Neg Cytology - Reactive mesothelial cells present  Glucose - 125  Lynn Joseph 07/24/22 - CBC with diff, CMET, TSH, T3/4, B12  CPAP Compliance Report 11/09/23-12/08/23 Usage days 29/30 days >4 hours 27 days Avg usage 5 hours 45 min BiPAP 20/16 Easy Breath On AHI 2.3    Assessment & Plan:   Discussion: 78 year old female with HTN, hypothyroidism, atrial fibrillation on AC, s/p roux-en-y gastric bypass in 2009, CKD IIIb, ?diastolic heart failure who presents for OSA follow-up. Reviewed compliance report with improved AHI on BiPAP 20/16 however patient still suffering from  increased pressure. Will reduce again and re-evaluate   Moderate OSA Compliance 02/24/23-03/25/23 (30 days) 100% compliance with 90% >4 hours. Avg AHI 27.8 Compliance with new facemask AHI 11.6 --Reviewed compliance report with AHI 2.3 on BiPAP 20/16. However remains uncomfortable  >Reduce BiPAP IPAP 18 EPAP14  >Will also request new face mask. Size small --Counseled on sleep hygiene --Counseled on weight loss/maintenance of healthy weight --Counseled NOT to drive if/when sleepy --Advised patient to wear CPAP for at least 4 hours each night for greater than 70% of the time to avoid the machine being repossessed by insurance.  Small Left Pleural Effusion - improving on diuretics --Take lasix  as directed by Cardiology --Monitor weights daily   Health Maintenance  Immunization History  Administered Date(s) Administered   Influenza, High Dose Seasonal PF 01/31/2016   PFIZER(Purple Top)SARS-COV-2 Vaccination 05/15/2019, 06/05/2019   CT Lung Screen not qualified. Never smoker  No orders of the defined types were placed in this encounter. No orders of the defined types were placed in this encounter.   Return in about 3 months (around 03/10/2024).  I have spent a total time of 30-minutes on the day of the appointment including chart review, data review, collecting history, coordinating care and discussing medical diagnosis and plan with the patient/family. Past medical history, allergies, medications were reviewed. Pertinent imaging, labs and tests included in this note have been reviewed and interpreted independently by me.  Berda Shelvin Slater Staff, MD [facility] 12/09/2023 3:03 PM

## 2023-12-31 ENCOUNTER — Encounter (HOSPITAL_COMMUNITY): Payer: Self-pay

## 2024-01-02 ENCOUNTER — Ambulatory Visit
Admission: RE | Admit: 2024-01-02 | Discharge: 2024-01-02 | Disposition: A | Discharge status: Home / Self Care | Source: Ambulatory Visit | Attending: Medical | Admitting: Medical

## 2024-01-02 DIAGNOSIS — R072 Precordial pain: Secondary | ICD-10-CM | POA: Insufficient documentation

## 2024-01-02 DIAGNOSIS — I700 Atherosclerosis of aorta: Secondary | ICD-10-CM | POA: Diagnosis not present

## 2024-01-02 LAB — NM PET CT CARDIAC PERFUSION MULTI W/ABSOLUTE BLOODFLOW
LV dias vol: 91 mL (ref 46–106)
MBFR: 3
Nuc Rest EF: 56 %
Nuc Stress EF: 58 %
Peak HR: 90 {beats}/min
Rest HR: 75 {beats}/min
Rest MBF: 0.86 ml/g/min
Rest Nuclear Isotope Dose: 22.2 mCi
SRS: 0
SSS: 0
ST Depression (mm): 0 mm
Stress MBF: 2.58 ml/g/min
Stress Nuclear Isotope Dose: 22.2 mCi
TID: 1.04

## 2024-01-02 MED ORDER — RUBIDIUM RB82 GENERATOR (RUBYFILL)
25.0000 | PACK | Freq: Once | INTRAVENOUS | Status: AC
  Administered 2024-01-02: 22.18 via INTRAVENOUS

## 2024-01-02 MED ORDER — REGADENOSON 0.4 MG/5ML IV SOLN
INTRAVENOUS | Status: AC
  Filled 2024-01-02: qty 5

## 2024-01-02 MED ORDER — RUBIDIUM RB82 GENERATOR (RUBYFILL)
25.0000 | PACK | Freq: Once | INTRAVENOUS | Status: AC
  Administered 2024-01-02: 22.2 via INTRAVENOUS

## 2024-01-02 MED ORDER — REGADENOSON 0.4 MG/5ML IV SOLN
0.4000 mg | Freq: Once | INTRAVENOUS | Status: AC
  Administered 2024-01-02: 0.4 mg via INTRAVENOUS
  Filled 2024-01-02: qty 5

## 2024-01-08 ENCOUNTER — Other Ambulatory Visit: Payer: Self-pay | Admitting: Internal Medicine

## 2024-01-08 DIAGNOSIS — Z1231 Encounter for screening mammogram for malignant neoplasm of breast: Secondary | ICD-10-CM

## 2024-01-09 ENCOUNTER — Ambulatory Visit
Admission: RE | Admit: 2024-01-09 | Discharge: 2024-01-09 | Disposition: A | Discharge status: Home / Self Care | Source: Ambulatory Visit | Attending: Internal Medicine | Admitting: Internal Medicine

## 2024-01-09 DIAGNOSIS — Z1231 Encounter for screening mammogram for malignant neoplasm of breast: Secondary | ICD-10-CM | POA: Diagnosis not present

## 2024-01-13 ENCOUNTER — Other Ambulatory Visit: Payer: Self-pay | Admitting: Internal Medicine

## 2024-01-13 DIAGNOSIS — R928 Other abnormal and inconclusive findings on diagnostic imaging of breast: Secondary | ICD-10-CM

## 2024-01-13 DIAGNOSIS — R921 Mammographic calcification found on diagnostic imaging of breast: Secondary | ICD-10-CM

## 2024-01-14 ENCOUNTER — Other Ambulatory Visit: Payer: Self-pay | Admitting: Internal Medicine

## 2024-01-14 ENCOUNTER — Ambulatory Visit
Admission: RE | Admit: 2024-01-14 | Discharge: 2024-01-14 | Disposition: A | Discharge status: Home / Self Care | Source: Ambulatory Visit | Attending: Internal Medicine | Admitting: Internal Medicine

## 2024-01-14 DIAGNOSIS — R928 Other abnormal and inconclusive findings on diagnostic imaging of breast: Secondary | ICD-10-CM

## 2024-01-14 DIAGNOSIS — R921 Mammographic calcification found on diagnostic imaging of breast: Secondary | ICD-10-CM

## 2024-01-15 ENCOUNTER — Ambulatory Visit
Admission: RE | Admit: 2024-01-15 | Discharge: 2024-01-15 | Disposition: A | Discharge status: Home / Self Care | Source: Ambulatory Visit | Attending: Internal Medicine | Admitting: Internal Medicine

## 2024-01-15 DIAGNOSIS — R921 Mammographic calcification found on diagnostic imaging of breast: Secondary | ICD-10-CM

## 2024-01-15 DIAGNOSIS — R928 Other abnormal and inconclusive findings on diagnostic imaging of breast: Secondary | ICD-10-CM

## 2024-01-15 DIAGNOSIS — N6489 Other specified disorders of breast: Secondary | ICD-10-CM | POA: Diagnosis not present

## 2024-01-15 HISTORY — PX: BREAST BIOPSY: SHX20

## 2024-01-16 LAB — SURGICAL PATHOLOGY

## 2024-01-21 DIAGNOSIS — N1832 Chronic kidney disease, stage 3b: Secondary | ICD-10-CM | POA: Diagnosis not present

## 2024-01-27 DIAGNOSIS — M6530 Trigger finger, unspecified finger: Secondary | ICD-10-CM | POA: Diagnosis not present

## 2024-01-27 DIAGNOSIS — Z23 Encounter for immunization: Secondary | ICD-10-CM | POA: Diagnosis not present

## 2024-01-27 DIAGNOSIS — I5030 Unspecified diastolic (congestive) heart failure: Secondary | ICD-10-CM | POA: Diagnosis not present

## 2024-01-27 DIAGNOSIS — M109 Gout, unspecified: Secondary | ICD-10-CM | POA: Diagnosis not present

## 2024-01-27 DIAGNOSIS — E039 Hypothyroidism, unspecified: Secondary | ICD-10-CM | POA: Diagnosis not present

## 2024-01-27 DIAGNOSIS — I130 Hypertensive heart and chronic kidney disease with heart failure and stage 1 through stage 4 chronic kidney disease, or unspecified chronic kidney disease: Secondary | ICD-10-CM | POA: Diagnosis not present

## 2024-01-27 DIAGNOSIS — N184 Chronic kidney disease, stage 4 (severe): Secondary | ICD-10-CM | POA: Diagnosis not present

## 2024-01-27 DIAGNOSIS — G4733 Obstructive sleep apnea (adult) (pediatric): Secondary | ICD-10-CM | POA: Diagnosis not present

## 2024-01-27 DIAGNOSIS — D61818 Other pancytopenia: Secondary | ICD-10-CM | POA: Diagnosis not present

## 2024-01-27 DIAGNOSIS — I480 Paroxysmal atrial fibrillation: Secondary | ICD-10-CM | POA: Diagnosis not present

## 2024-01-27 DIAGNOSIS — E785 Hyperlipidemia, unspecified: Secondary | ICD-10-CM | POA: Diagnosis not present

## 2024-01-29 DIAGNOSIS — N1832 Chronic kidney disease, stage 3b: Secondary | ICD-10-CM | POA: Diagnosis not present

## 2024-01-29 DIAGNOSIS — D631 Anemia in chronic kidney disease: Secondary | ICD-10-CM | POA: Diagnosis not present

## 2024-01-29 DIAGNOSIS — N2581 Secondary hyperparathyroidism of renal origin: Secondary | ICD-10-CM | POA: Diagnosis not present

## 2024-01-29 DIAGNOSIS — I129 Hypertensive chronic kidney disease with stage 1 through stage 4 chronic kidney disease, or unspecified chronic kidney disease: Secondary | ICD-10-CM | POA: Diagnosis not present

## 2024-02-20 DIAGNOSIS — M65332 Trigger finger, left middle finger: Secondary | ICD-10-CM | POA: Diagnosis not present

## 2024-03-08 NOTE — Progress Notes (Unsigned)
 Cardiology Office Note  Date:  03/09/2024   ID:  Lynn Joseph, Lynn Joseph [birth year], MRN [id number]  PCP:  Larnell Hamilton, MD   Chief Complaint  Patient presents with   3 month follow up     Doing well.     HPI:  [name] is a 78 year old woman with past medical history of Hypertension Thyroid  disorder Apnea CRI PAD PAF seen November 2023 in the setting of acute cholecystitis Chronic renal sufficiency creatinine 1.9 S/p stomach stapling 30 years ago, repair roux-y History of pleural effusion requiring thoracentesis Presenting for follow-up of her diastolic CHF, paroxysmal atrial fibrillation, renal artery stenosis, anemia  Last seen by myself 1/25 Seen by one of our providers August 2025  Cardiac PET scan January 02, 2024 reviewed Low risk study normal ejection fraction   no recent hospitalizations  Echo January 2025 EF 55 to 60%, normal RV function Severely dilated left atrium Moderate left pleural effusion  Lab work reviewed LDL 36 Total cholesterol 108 Up from 7.5 in December 2024, now  hemoglobin 10.8  Labs through renal: cr 1.6, bun 40 , gfr 33, sept 25  Back on eliquis  5 BID, no bleeding, hemoglobin stable  BP at home 130s, well-controlled Previously on 1.75 wegovy changed to monjour minimal dose for insurance reasons, reports insurance will now no longer cover GLP-1 agonist  Taking lasix  10 daily, no ankle swelling  Other past medical history reviewed In the hospital 12/24: anemia, HGB 6.7 Reports all of her medications were held Followed by hematology  In the setting of anemia Echocardiogram May 10, 2023 for shortness of breath EF 55 to 60%, normal RV function Severely dilated left atrium Moderate pleural effusion on the left Mild to moderate MR  Was in [place]  hospitalized in November 2023 with new onset atrial fibrillation with RVR in the setting of acute cholecystitis. Following surgery with pleural effusion, thoracentesis  performed.  She was treated with IV diltiazem  and metoprolol  and converted to sinus rhythm. She was started on anticoagulation with Eliquis  given CHA2DS2-VASc score of 4.   Right renal artery ultrasound performed, right side was not well-visualized and possibly occluded with small size right kidney at 8.7 cm. Left renal artery was patent with normal size left kidney. She has chronic kidney disease with most recent creatinine of 1.58.   Echocardiogram November 2023 EF 55 to 60%  U/s in 10/21 Right Carotid: Velocities in the right ICA are consistent with a 1-39% stenosis.  Left Carotid: Velocities in the left ICA are consistent with a 40-59% stenosis.   PMH:   has a past medical history of Anemia of chronic disease, Chronic diarrhea, Glaucoma, HTN (hypertension), Hypertension, Hypothyroid, Obesity, and Skin cancer.  PSH:    Past Surgical History:  Procedure Laterality Date   BREAST BIOPSY Left 01/15/2024   MM LT BREAST BX W LOC DEV 1ST LESION IMAGE BX SPEC STEREO GUIDE 01/15/2024 [facility] MAMMOGRAPHY   CHOLECYSTECTOMY N/A 03/11/2022   Procedure: LAPAROSCOPIC CHOLECYSTECTOMY;  Surgeon: Dasie Leonor CROME, MD;  Location[facility] OR;  Service: General;  Laterality: N/A;   GASTRIC BYPASS     GASTRIC RESTRICTION SURGERY     IR REMOVAL BILIARY DRAIN  04/06/2022   IR SINUS/FIST TUBE CHK-NON GI  03/09/2022   RENAL ANGIOGRAPHY N/A 06/20/2022   Procedure: RENAL ANGIOGRAPHY;  Surgeon: Darron Deatrice LABOR, MD;  Location: [facility] INVASIVE CV LAB;  Service: Cardiovascular;  Laterality: N/A;   TUBAL LIGATION      Current Outpatient Medications  Medication Sig  Dispense Refill   apixaban  (ELIQUIS ) 5 MG TABS tablet Take 1 tablet (5 mg total) by mouth 2 (two) times daily. 180 tablet 2   Calcium -Magnesium -Zinc (CALCIUM -MAGNESUIUM-ZINC PO) Take 1 tablet by mouth 2 (two) times a week.     colchicine 0.6 MG tablet Take 0.3 mg by mouth daily as needed (AS DIRECTED for gout flares).     febuxostat  (ULORIC ) 40 MG tablet Take 40  mg by mouth daily.     ferrous sulfate  325 (65 FE) MG tablet Take 1 tablet (325 mg total) by mouth 2 (two) times daily with a meal. 60 tablet 0   furosemide  (LASIX ) 20 MG tablet Take 10 mg by mouth daily.     gabapentin  (NEURONTIN ) 100 MG capsule Take 100 mg by mouth at bedtime.     Levothyroxine  Sodium (TIROSINT ) 100 MCG CAPS Take 1 capsule (100 mcg total) by mouth daily before breakfast. 30 capsule 0   Multiple Vitamins-Minerals (ONE DAILY CALCIUM /IRON ) TABS      Nebivolol  HCl 20 MG TABS TAKE 1 TABLET BY MOUTH TWICE DAILY 180 tablet 3   rosuvastatin  (CRESTOR ) 5 MG tablet TAKE 1 TABLET(5 MG) BY MOUTH DAILY 90 tablet 3   Thiamine  HCl 100 MG CAPS      tirzepatide (MOUNJARO) 2.5 MG/0.5ML Pen Inject 2.5 mg into the skin once a week.     TYLENOL  500 MG tablet Take 1,000 mg by mouth every 6 (six) hours as needed for mild pain (pain score 1-3), headache or fever.     Cyanocobalamin  2500 MCG TABS      WEGOVY 0.25 MG/0.5ML SOAJ Inject 0.25 mg into the skin once a week. (Patient taking differently: Inject 1 mg into the skin once a week.)     No current facility-administered medications for this visit.    Allergies:   Amlodipine, Carvedilol , and Penicillin g sodium   Social History:  The patient  reports that she has never smoked. She has never used smokeless tobacco. She reports that she does not currently use alcohol . She reports that she does not use drugs.   Family History:   family history includes Diabetes in her father; Hypertension in her mother; Kidney disease in her father; Stroke in her mother; Thyroid  disease in her mother.    Review of Systems: Review of Systems  Constitutional: Negative.   HENT: Negative.    Respiratory: Negative.    Cardiovascular:  Positive for leg swelling.  Gastrointestinal: Negative.   Musculoskeletal: Negative.   Neurological: Negative.   Psychiatric/Behavioral: Negative.    All other systems reviewed and are negative.  PHYSICAL EXAM: VS:  BP (!) 158/78  (BP Location: Left Arm, Patient Position: Sitting, Cuff Size: Normal)   Pulse 65   Ht 5' 5 (1.651 m)   Wt 185 lb 4 oz (84 kg)   SpO2 99%   BMI 30.83 kg/m  , BMI Body mass index is 30.83 kg/m. Constitutional:  oriented to person, place, and time. No distress.  HENT:  Head: Grossly normal Eyes:  no discharge. No scleral icterus.  Neck: No JVD, no carotid bruits  Cardiovascular: Regular rate and rhythm, no murmurs appreciated Pulmonary/Chest: Clear to auscultation bilaterally, no wheezes or rales Abdominal: Soft.  no distension.  no tenderness.  Musculoskeletal: Normal range of motion Neurological:  normal muscle tone. Coordination normal. No atrophy Skin: Skin warm and dry Psychiatric: normal affect, pleasant  Recent Labs: 08/28/2023: B Natriuretic Peptide 260.5; BUN 36; Creatinine, Ser 1.77; Hemoglobin 10.8; Platelets 147; Potassium 4.2; Sodium 136;  TSH 0.330 12/06/2023: ALT 40; Magnesium  1.1    Lipid Panel Lab Results  Component Value Date   CHOL 108 12/06/2023   HDL 52 12/06/2023   LDLCALC 35 12/06/2023   TRIG 114 12/06/2023      Wt Readings from Last 3 Encounters:  03/09/24 185 lb 4 oz (84 kg)  12/09/23 189 lb (85.7 kg)  12/06/23 190 lb 3.2 oz (86.3 kg)     ASSESSMENT AND PLAN:  Problem List Items Addressed This Visit     CKD (chronic kidney disease) stage 3, GFR 30-59 ml/min (HCC)   Dyspnea   Other Visit Diagnoses       Paroxysmal atrial fibrillation (HCC)    -  Primary   Relevant Medications   furosemide  (LASIX ) 20 MG tablet     Essential hypertension       Relevant Medications   furosemide  (LASIX ) 20 MG tablet     Hyperlipidemia, mixed       Relevant Medications   furosemide  (LASIX ) 20 MG tablet     Near syncope       Relevant Medications   furosemide  (LASIX ) 20 MG tablet     Peripheral edema           Chest pain Normal ejection fraction November 2023 Low risk stress test September 2023 with no significant ischemia -Low risk PET stress  September 2025 No further ischemic workup needed  Paroxysmal atrial fibrillation Eliquis  restarted, hemoglobin stable Recommend monitoring device to track pulse Overall asymptomatic  Hyperlipidemia Tolerating Crestor  5 mg daily Goal LDL less than 70  Chronic renal sufficiency/renal artery stenosis Creatinine 1.6, followed by nephrology  Essential hypertension Blood pressure elevated in the office today but she reports it is well-controlled at home, no medication changes made  Obesity Reports having 30 pound weight loss on Wegovy She has PAD including carotid stenosis, coronary calcification, aortic atherosclerosis -Will see if she can qualify for [facility] so she can continue her therapy given her underlying PAD Previously on 1.75   Signed, Velinda Lunger, M.D., Ph.D. [facility] [place], [place] [phone]

## 2024-03-09 ENCOUNTER — Other Ambulatory Visit (HOSPITAL_COMMUNITY): Payer: Self-pay

## 2024-03-09 ENCOUNTER — Ambulatory Visit: Attending: Cardiovascular Disease | Admitting: Cardiovascular Disease

## 2024-03-09 ENCOUNTER — Telehealth: Payer: Self-pay

## 2024-03-09 VITALS — BP 158/78 | HR 65 | Ht 65.0 in | Wt 185.2 lb

## 2024-03-09 DIAGNOSIS — I10 Essential (primary) hypertension: Secondary | ICD-10-CM | POA: Insufficient documentation

## 2024-03-09 DIAGNOSIS — R0602 Shortness of breath: Secondary | ICD-10-CM | POA: Insufficient documentation

## 2024-03-09 DIAGNOSIS — R600 Localized edema: Secondary | ICD-10-CM | POA: Insufficient documentation

## 2024-03-09 DIAGNOSIS — R55 Syncope and collapse: Secondary | ICD-10-CM | POA: Insufficient documentation

## 2024-03-09 DIAGNOSIS — I480 Paroxysmal atrial fibrillation: Secondary | ICD-10-CM | POA: Diagnosis not present

## 2024-03-09 DIAGNOSIS — N1832 Chronic kidney disease, stage 3b: Secondary | ICD-10-CM | POA: Diagnosis not present

## 2024-03-09 DIAGNOSIS — I739 Peripheral vascular disease, unspecified: Secondary | ICD-10-CM | POA: Diagnosis not present

## 2024-03-09 DIAGNOSIS — E782 Mixed hyperlipidemia: Secondary | ICD-10-CM | POA: Diagnosis not present

## 2024-03-09 MED ORDER — WEGOVY 0.25 MG/0.5ML ~~LOC~~ SOAJ: 0.2500 mg | SUBCUTANEOUS | 3 refills | Status: AC

## 2024-03-09 NOTE — Telephone Encounter (Signed)
 Pharmacy Patient Advocate Encounter   Received notification from Physician's Office that prior authorization for [facility] is required/requested.   Insurance verification completed.   The patient is insured through [employer].   Per test claim: Per test claim, medication is not covered due to plan/benefit exclusion, PA not submitted at this time

## 2024-03-09 NOTE — Patient Instructions (Addendum)
 Ask PMD about PAD (carotid stenosis, aortic atherosclerosis and coronary calcification) to get wegovy/monjouro  Medication Instructions:  No changes  If you need a refill on your cardiac medications before your next appointment, please call your pharmacy.   Lab work: No new labs needed  Testing/Procedures: No new testing needed  Follow-Up: At [facility], you and your health needs are our priority.  As part of our continuing mission to provide you with exceptional heart care, we have created designated Provider Care Teams.  These Care Teams include your primary Cardiologist (physician) and Advanced Practice Providers (APPs -  Physician Assistants and Nurse Practitioners) who all work together to provide you with the care you need, when you need it.  You will need a follow up appointment in 12 months  Providers on your designated Care Team:   Lonni Meager, NP Bernardino Bring, PA-C Cadence [place], [place]  COVID-19 Vaccine Information can be found at: [email or web address] For questions related to vaccine distribution or appointments, please email [email or web address] or call [phone].

## 2024-03-09 NOTE — Telephone Encounter (Signed)
-----   Message from Nurse Olivia HERO sent at 03/09/2024 11:28 AM EST ----- Patient needs prior authorization for [facility].

## 2024-03-10 ENCOUNTER — Ambulatory Visit (HOSPITAL_BASED_OUTPATIENT_CLINIC_OR_DEPARTMENT_OTHER): Admitting: Pulmonary Disease

## 2024-03-23 ENCOUNTER — Ambulatory Visit (INDEPENDENT_AMBULATORY_CARE_PROVIDER_SITE_OTHER): Admitting: Pulmonary Disease

## 2024-03-23 ENCOUNTER — Ambulatory Visit (INDEPENDENT_AMBULATORY_CARE_PROVIDER_SITE_OTHER)

## 2024-03-23 ENCOUNTER — Encounter (HOSPITAL_BASED_OUTPATIENT_CLINIC_OR_DEPARTMENT_OTHER): Payer: Self-pay | Admitting: Pulmonary Disease

## 2024-03-23 VITALS — BP 134/77 | HR 66 | Ht 65.0 in | Wt 182.2 lb

## 2024-03-23 DIAGNOSIS — J90 Pleural effusion, not elsewhere classified: Secondary | ICD-10-CM

## 2024-03-23 DIAGNOSIS — G4733 Obstructive sleep apnea (adult) (pediatric): Secondary | ICD-10-CM

## 2024-03-23 NOTE — Patient Instructions (Signed)
 Moderate OSA OSA --Reviewed compliance report 02/17/24-03/17/24 with AHI 2.3on BiPAP 18/14. Size small face mask --REDUCE BiPAP to 16/12. Will follow-up AHI at next visit --Counseled on sleep hygiene --Counseled on weight loss/maintenance of healthy weight. Planning on obtaining Wegovy  with different provider --Counseled NOT to drive if/when sleepy  Small Left Pleural Effusion  --CXR today to ensure stability --Take lasix  as directed by Cardiology --Monitor weights daily

## 2024-03-23 NOTE — Progress Notes (Signed)
 Subjective:   [name]: Lynn Joseph GENDER: female DOB: 1945-08-16, MRN: [id number]  Chief Complaint  Patient presents with   Sleep Apnea    Reason for Visit: Follow-up  [name] is a 78 year old female with atrial fibrillation, hx pleural effusion that self resolved, HTN, hypothyroidism, s/p roux-en-y gastric bypass who presents for OSA follow-up.  2023 - Initially seen by [facility] while inpatient for [facility] and found with right pleural effusion.  2024 - Self resolution of right pleural effusion. Dx with OSA and CPAP ordered in Feb  05/20/23 Since our last visit she reports her breathing is overall doing well. Recently had echocardiogram demonstrating moderate left pleural effusion. She declined thoracentesis and opted for lasix  trial. Now able to lay down flat. Denies shortness of breath, cough after taking additional lasix . She has had mask fitting and is compliant with CPAP for 7 hours nightly with less leakage and feels it improves her quality of sleep.   12/09/23 Since our last visit she was started on BiPAP however pressure is high. She is compliant. Does not feel it is helping quality of sleep. The pressure has been adjusted once but still feels high pressure. Still having leakage from mask.  03/23/24 Since our last visit she reports improved tolerance of BIPAP but still having discomfort with open dry mouth. Wears nightly and feels it helps her quality of sleep. Denies headaches. Has lost 25 lbs since April on Wegovy . Lost insurance coverage on this. Reports shortness of breath has resolved.   Social History: Denies any smoking history however lived with smoker and had wood burning stove used for heat and cooking in home for >30 years    Past Medical History:  Diagnosis Date   Anemia of chronic disease    Chronic diarrhea    Glaucoma    HTN (hypertension)    Hypertension    Hypothyroid    Obesity    Skin cancer      Family History  Problem  Relation Age of Onset   Hypertension Mother    Thyroid  disease Mother    Stroke Mother    Kidney disease Father    Diabetes Father    Breast cancer Neg Hx      Social History   Occupational History   Not on file  Tobacco Use   Smoking status: Never   Smokeless tobacco: Never  Vaping Use   Vaping status: Never Used  Substance and Sexual Activity   Alcohol  use: Not Currently    Comment: 1 glass of wine weekly   Drug use: No   Sexual activity: Not on file    Allergies  Allergen Reactions   Amlodipine Swelling and Other (See Comments)    Lowered B/P too much, made lethargic, also     Carvedilol  Other (See Comments)    Dropped blood pressure too low   Penicillin G Sodium Other (See Comments)    GI upset     Outpatient Medications Prior to Visit  Medication Sig Dispense Refill   apixaban  (ELIQUIS ) 5 MG TABS tablet Take 1 tablet (5 mg total) by mouth 2 (two) times daily. 180 tablet 2   Calcium -Magnesium -Zinc (CALCIUM -MAGNESUIUM-ZINC PO) Take 1 tablet by mouth 2 (two) times a week.     colchicine 0.6 MG tablet Take 0.3 mg by mouth daily as needed (AS DIRECTED for gout flares).     Cyanocobalamin  2500 MCG TABS      febuxostat  (ULORIC ) 40 MG tablet Take  40 mg by mouth daily.     ferrous sulfate  325 (65 FE) MG tablet Take 1 tablet (325 mg total) by mouth 2 (two) times daily with a meal. 60 tablet 0   furosemide  (LASIX ) 20 MG tablet Take 10 mg by mouth daily.     gabapentin  (NEURONTIN ) 100 MG capsule Take 100 mg by mouth at bedtime.     Levothyroxine  Sodium (TIROSINT ) 100 MCG CAPS Take 1 capsule (100 mcg total) by mouth daily before breakfast. 30 capsule 0   Multiple Vitamins-Minerals (ONE DAILY CALCIUM /IRON ) TABS      Nebivolol  HCl 20 MG TABS TAKE 1 TABLET BY MOUTH TWICE DAILY 180 tablet 3   rosuvastatin  (CRESTOR ) 5 MG tablet TAKE 1 TABLET(5 MG) BY MOUTH DAILY 90 tablet 3   Thiamine  HCl 100 MG CAPS      TYLENOL  500 MG tablet Take 1,000 mg by mouth every 6 (six) hours as  needed for mild pain (pain score 1-3), headache or fever.     tirzepatide (MOUNJARO) 2.5 MG/0.5ML Pen Inject 2.5 mg into the skin once a week. (Patient not taking: Reported on 03/23/2024)     WEGOVY  0.25 MG/0.5ML SOAJ SQ injection Inject 0.25 mg into the skin once a week. (Patient not taking: Reported on 03/23/2024) 2 mL 3   No facility-administered medications prior to visit.    Review of Systems  Constitutional:  Negative for chills, diaphoresis, fever, malaise/fatigue and weight loss.  HENT:  Negative for congestion.   Respiratory:  Negative for cough, hemoptysis, sputum production, shortness of breath and wheezing.   Cardiovascular:  Negative for chest pain, palpitations and leg swelling.     Objective:   Vitals:   03/23/24 1304  BP: 134/77  Pulse: 66  SpO2: 100%  Weight: 182 lb 3.2 oz (82.6 kg)  Height: 5' 5 (1.651 m)     SpO2: 100 %  Physical Exam: General: Well-appearing, no acute distress HENT: [place], AT Eyes: EOMI, no scleral icterus Respiratory: Clear to auscultation bilaterally.  No crackles, wheezing or rales Cardiovascular: RRR, -M/R/G, no JVD Extremities:-Edema,-tenderness Neuro: AAO x4, CNII-XII grossly intact Psych: Normal mood, normal affect  Data Reviewed:  Imaging: CXR 03/19/22 - Increased right pleural effusion CXR 05/14/22 - Resolved right pleural effusion CXR 05/06/23 - Left lung base c/w with small pleural effusion with atelectasis CXR 05/20/23 - Slightly improved with left pleural effusion  PFT: 05/14/22 FVC 2.21 (78%) FEV1 1.80 (85%) Ratio 82  TLC 96% DLCO 87% Interpretation: Normal PFTs  Sleep: HST 05/23/22 - Moderate OSA AHI 19.5 Nadir SpO2 82%  Labs:  Thoracentesis 03/16/2022 Red turbid fluid with cell count 1990 N 17% L 72% M percent Protein ratio 3.5/6.4 LDH ratio 141/251 Culture - Neg Cytology - Reactive mesothelial cells present  Glucose - 125  [facility] 07/24/22 - CBC with diff, CMET, TSH, T3/4, B12  CPAP  Compliance Report 11/09/23-12/08/23 Usage days 29/30 days >4 hours 27 days Avg usage 5 hours 45 min BiPAP 20/16 Easy Breath On AHI 2.3    BiPAP Compliance Report 02/17/24-03/17/24 Usage 29/30 days (97%) >4 hours 29 (97%) AHI 2.3 IPAP 18 EPAP 14 Assessment & Plan:   Discussion: 78 year old female with HTN, hypothyroidism, atrial fibrillation on AC, s/p roux-en-y gastric bypass in 2009, CKD IIIb, ?diastolic heart failure who presents for OSA follow-up. Reviewed compliance report with stable AHI on BiPAP 18/14 however patient still uncomfortable from increased pressure. Also had 25 lbs weight loss. Will reduce again and re-evaluate at next visit  Moderate OSA OSA --Patient uses NIV for more than four hours nightly for at least 70% of nights during the last three months of usage. The patient has been using and benefiting from PAP use and will continue to benefit from therapy.  --Reviewed compliance report 02/17/24-03/17/24 with AHI 2.3on BiPAP 18/14. Size small face mask --REDUCE BiPAP to 16/12. Will follow-up AHI at next visit --Counseled on sleep hygiene --Counseled on weight loss/maintenance of healthy weight. Planning on obtaining Wegovy  with different provider --Counseled NOT to drive if/when sleepy --Advised patient to wear CPAP for at least 4 hours each night for greater than 70% of the time to avoid the machine being repossessed by insurance.  Small Left Pleural Effusion  --CXR today to ensure stability --Take lasix  as directed by Cardiology --Monitor weights daily   Health Maintenance Immunization History  Administered Date(s) Administered   INFLUENZA, HIGH DOSE SEASONAL PF 01/31/2016   PFIZER(Purple Top)SARS-COV-2 Vaccination 05/15/2019, 06/05/2019   CT Lung Screen not qualified. Never smoker  Orders Placed This Encounter  Procedures   DG Chest 2 View    Standing Status:   Future    Expiration Date:   03/23/2025    Reason for Exam (SYMPTOM  OR DIAGNOSIS  REQUIRED):   left pleural effusion    Preferred imaging location?:   [facility]  No orders of the defined types were placed in this encounter.   Return in about 4 months (around 07/22/2024).  I have spent a total time of 30-minutes on the day of the appointment including chart review, data review, collecting history, coordinating care and discussing medical diagnosis and plan with the patient/family. Past medical history, allergies, medications were reviewed. Pertinent imaging, labs and tests included in this note have been reviewed and interpreted independently by me.  Sebrina Kessner Slater Staff, MD [facility] 03/23/2024 1:19 PM

## 2024-03-27 ENCOUNTER — Ambulatory Visit (HOSPITAL_BASED_OUTPATIENT_CLINIC_OR_DEPARTMENT_OTHER): Payer: Self-pay | Admitting: Pulmonary Disease

## 2024-04-27 ENCOUNTER — Other Ambulatory Visit: Payer: Self-pay

## 2024-04-29 MED ORDER — ROSUVASTATIN CALCIUM 5 MG PO TABS: 5.0000 mg | ORAL_TABLET | Freq: Every day | ORAL | 3 refills | Status: AC

## 2024-05-29 ENCOUNTER — Other Ambulatory Visit: Payer: Self-pay | Admitting: Cardiovascular Disease

## 2024-07-21 ENCOUNTER — Ambulatory Visit (HOSPITAL_BASED_OUTPATIENT_CLINIC_OR_DEPARTMENT_OTHER): Admitting: Pulmonary Disease
